# Patient Record
Sex: Female | Born: 1940
Health system: Southern US, Community
[De-identification: ages and names within clinical notes are randomized; demographics above are authoritative.]

## PROBLEM LIST (undated history)

## (undated) DIAGNOSIS — H269 Unspecified cataract: Secondary | ICD-10-CM

## (undated) DIAGNOSIS — K2970 Gastritis, unspecified, without bleeding: Secondary | ICD-10-CM

## (undated) DIAGNOSIS — D869 Sarcoidosis, unspecified: Secondary | ICD-10-CM

## (undated) DIAGNOSIS — I499 Cardiac arrhythmia, unspecified: Secondary | ICD-10-CM

## (undated) DIAGNOSIS — R112 Nausea with vomiting, unspecified: Secondary | ICD-10-CM

## (undated) DIAGNOSIS — K635 Polyp of colon: Secondary | ICD-10-CM

## (undated) DIAGNOSIS — K2990 Gastroduodenitis, unspecified, without bleeding: Secondary | ICD-10-CM

## (undated) DIAGNOSIS — Z860100 Personal history of colon polyps, unspecified: Secondary | ICD-10-CM

## (undated) DIAGNOSIS — N390 Urinary tract infection, site not specified: Secondary | ICD-10-CM

## (undated) DIAGNOSIS — C801 Malignant (primary) neoplasm, unspecified: Secondary | ICD-10-CM

## (undated) DIAGNOSIS — K5730 Diverticulosis of large intestine without perforation or abscess without bleeding: Secondary | ICD-10-CM

## (undated) DIAGNOSIS — K209 Esophagitis, unspecified without bleeding: Secondary | ICD-10-CM

## (undated) DIAGNOSIS — F419 Anxiety disorder, unspecified: Secondary | ICD-10-CM

## (undated) DIAGNOSIS — Z9889 Other specified postprocedural states: Secondary | ICD-10-CM

## (undated) DIAGNOSIS — IMO0001 Reserved for inherently not codable concepts without codable children: Secondary | ICD-10-CM

## (undated) DIAGNOSIS — T7840XA Allergy, unspecified, initial encounter: Secondary | ICD-10-CM

## (undated) DIAGNOSIS — E7800 Pure hypercholesterolemia, unspecified: Secondary | ICD-10-CM

## (undated) DIAGNOSIS — H209 Unspecified iridocyclitis: Secondary | ICD-10-CM

## (undated) DIAGNOSIS — M1990 Unspecified osteoarthritis, unspecified site: Secondary | ICD-10-CM

## (undated) DIAGNOSIS — I341 Nonrheumatic mitral (valve) prolapse: Secondary | ICD-10-CM

## (undated) DIAGNOSIS — D126 Benign neoplasm of colon, unspecified: Secondary | ICD-10-CM

## (undated) DIAGNOSIS — K589 Irritable bowel syndrome without diarrhea: Secondary | ICD-10-CM

## (undated) DIAGNOSIS — K449 Diaphragmatic hernia without obstruction or gangrene: Secondary | ICD-10-CM

## (undated) DIAGNOSIS — M8580 Other specified disorders of bone density and structure, unspecified site: Secondary | ICD-10-CM

## (undated) DIAGNOSIS — K219 Gastro-esophageal reflux disease without esophagitis: Secondary | ICD-10-CM

## (undated) DIAGNOSIS — K222 Esophageal obstruction: Secondary | ICD-10-CM

## (undated) DIAGNOSIS — I10 Essential (primary) hypertension: Principal | ICD-10-CM

## (undated) HISTORY — PX: VESICOVAGINAL FISTULA CLOSURE W/ TAH: SUR271

## (undated) HISTORY — DX: Gastroduodenitis, unspecified, without bleeding: K29.90

## (undated) HISTORY — DX: Irritable bowel syndrome, unspecified: K58.9

## (undated) HISTORY — DX: Anxiety disorder, unspecified: F41.9

## (undated) HISTORY — PX: CATARACT EXTRACTION: SUR2

## (undated) HISTORY — DX: Personal history of colonic polyps: Z86.010

## (undated) HISTORY — DX: Urinary tract infection, site not specified: N39.0

## (undated) HISTORY — PX: POLYPECTOMY: SHX149

## (undated) HISTORY — DX: Malignant (primary) neoplasm, unspecified: C80.1

## (undated) HISTORY — DX: Diaphragmatic hernia without obstruction or gangrene: K44.9

## (undated) HISTORY — DX: Personal history of colon polyps, unspecified: Z86.0100

## (undated) HISTORY — DX: Sarcoidosis, unspecified: D86.9

## (undated) HISTORY — DX: Esophageal obstruction: K22.2

## (undated) HISTORY — DX: Diverticulosis of large intestine without perforation or abscess without bleeding: K57.30

## (undated) HISTORY — PX: UPPER GASTROINTESTINAL ENDOSCOPY: SHX188

## (undated) HISTORY — DX: Esophagitis, unspecified without bleeding: K20.90

## (undated) HISTORY — DX: Other specified disorders of bone density and structure, unspecified site: M85.80

## (undated) HISTORY — DX: Unspecified cataract: H26.9

## (undated) HISTORY — DX: Polyp of colon: K63.5

## (undated) HISTORY — DX: Nonrheumatic mitral (valve) prolapse: I34.1

## (undated) HISTORY — DX: Benign neoplasm of colon, unspecified: D12.6

## (undated) HISTORY — DX: Allergy, unspecified, initial encounter: T78.40XA

## (undated) HISTORY — PX: BASAL CELL CARCINOMA EXCISION: SHX1214

## (undated) HISTORY — DX: Pure hypercholesterolemia, unspecified: E78.00

## (undated) HISTORY — DX: Essential (primary) hypertension: I10

## (undated) HISTORY — DX: Gastritis, unspecified, without bleeding: K29.70

## (undated) HISTORY — DX: Gastro-esophageal reflux disease without esophagitis: K21.9

## (undated) HISTORY — DX: Unspecified iridocyclitis: H20.9

## (undated) HISTORY — DX: Esophagitis, unspecified: K20.9

## (undated) HISTORY — DX: Unspecified osteoarthritis, unspecified site: M19.90

---

## 1998-11-03 ENCOUNTER — Ambulatory Visit (HOSPITAL_COMMUNITY): Admission: RE | Admit: 1998-11-03 | Discharge: 1998-11-03 | Payer: Self-pay | Admitting: Pulmonary Disease

## 1998-11-03 ENCOUNTER — Encounter: Payer: Self-pay | Admitting: Pulmonary Disease

## 1998-12-20 ENCOUNTER — Ambulatory Visit (HOSPITAL_BASED_OUTPATIENT_CLINIC_OR_DEPARTMENT_OTHER): Admission: RE | Admit: 1998-12-20 | Discharge: 1998-12-20 | Payer: Self-pay | Admitting: Orthopedic Surgery

## 2004-07-03 ENCOUNTER — Ambulatory Visit: Payer: Self-pay | Admitting: Pulmonary Disease

## 2004-07-18 ENCOUNTER — Ambulatory Visit: Payer: Self-pay | Admitting: Pulmonary Disease

## 2005-06-04 ENCOUNTER — Ambulatory Visit: Payer: Self-pay | Admitting: Pulmonary Disease

## 2005-07-13 ENCOUNTER — Ambulatory Visit: Payer: Self-pay | Admitting: Pulmonary Disease

## 2005-12-11 ENCOUNTER — Ambulatory Visit: Payer: Self-pay | Admitting: Gastroenterology

## 2005-12-18 ENCOUNTER — Ambulatory Visit: Payer: Self-pay | Admitting: Internal Medicine

## 2006-01-01 ENCOUNTER — Encounter (INDEPENDENT_AMBULATORY_CARE_PROVIDER_SITE_OTHER): Payer: Self-pay | Admitting: Specialist

## 2006-01-01 ENCOUNTER — Ambulatory Visit: Payer: Self-pay | Admitting: Gastroenterology

## 2006-01-15 ENCOUNTER — Ambulatory Visit: Payer: Self-pay | Admitting: Gastroenterology

## 2006-01-17 ENCOUNTER — Other Ambulatory Visit: Admission: RE | Admit: 2006-01-17 | Discharge: 2006-01-17 | Payer: Self-pay | Admitting: Obstetrics and Gynecology

## 2006-02-26 ENCOUNTER — Encounter: Admission: RE | Admit: 2006-02-26 | Discharge: 2006-02-26 | Payer: Self-pay | Admitting: Obstetrics and Gynecology

## 2006-04-26 ENCOUNTER — Encounter (INDEPENDENT_AMBULATORY_CARE_PROVIDER_SITE_OTHER): Payer: Self-pay | Admitting: Specialist

## 2006-04-27 ENCOUNTER — Inpatient Hospital Stay (HOSPITAL_COMMUNITY): Admission: RE | Admit: 2006-04-27 | Discharge: 2006-04-28 | Payer: Self-pay | Admitting: Obstetrics and Gynecology

## 2006-08-01 ENCOUNTER — Ambulatory Visit: Payer: Self-pay | Admitting: Pulmonary Disease

## 2006-08-01 LAB — CONVERTED CEMR LAB
Albumin: 4.1 g/dL (ref 3.5–5.2)
Basophils Absolute: 0 10*3/uL (ref 0.0–0.1)
Bilirubin Urine: NEGATIVE
CO2: 30 meq/L (ref 19–32)
Chloride: 102 meq/L (ref 96–112)
Chol/HDL Ratio, serum: 4.1
Creatinine, Ser: 0.9 mg/dL (ref 0.4–1.2)
Eosinophil percent: 2.7 % (ref 0.0–5.0)
GFR calc non Af Amer: 67 mL/min
Glucose, Bld: 105 mg/dL — ABNORMAL HIGH (ref 70–99)
HCT: 45.6 % (ref 36.0–46.0)
Ketones, ur: NEGATIVE mg/dL
MCHC: 33.6 g/dL (ref 30.0–36.0)
MCV: 90.3 fL (ref 78.0–100.0)
Monocytes Absolute: 0.4 10*3/uL (ref 0.2–0.7)
Monocytes Relative: 8.3 % (ref 3.0–11.0)
Neutro Abs: 2.9 10*3/uL (ref 1.4–7.7)
Nitrite: NEGATIVE
Platelets: 292 10*3/uL (ref 150–400)
RBC: 5.05 M/uL (ref 3.87–5.11)
RDW: 12.3 % (ref 11.5–14.6)
Sodium: 141 meq/L (ref 135–145)
Specific Gravity, Urine: <=1.005 (ref 1.000–1.03)
Total Bilirubin: 1.4 mg/dL — ABNORMAL HIGH (ref 0.3–1.2)
Total Protein, Urine: NEGATIVE mg/dL
Triglyceride fasting, serum: 122 mg/dL (ref 0–149)
Urine Glucose: NEGATIVE mg/dL
VLDL: 24 mg/dL (ref 0–40)
pH: 6 (ref 5.0–8.0)

## 2006-08-05 ENCOUNTER — Ambulatory Visit: Payer: Self-pay | Admitting: Cardiology

## 2006-08-14 ENCOUNTER — Ambulatory Visit: Payer: Self-pay | Admitting: Pulmonary Disease

## 2006-09-30 ENCOUNTER — Ambulatory Visit: Payer: Self-pay | Admitting: Pulmonary Disease

## 2006-12-19 ENCOUNTER — Ambulatory Visit: Payer: Self-pay | Admitting: Pulmonary Disease

## 2006-12-19 LAB — CONVERTED CEMR LAB
Crystals: NEGATIVE
Mucus, UA: NEGATIVE
Total Protein, Urine: 100 mg/dL — AB
Urobilinogen, UA: 0.2 (ref 0.0–1.0)

## 2007-01-23 ENCOUNTER — Ambulatory Visit: Payer: Self-pay | Admitting: Pulmonary Disease

## 2007-01-23 LAB — CONVERTED CEMR LAB
BUN: 13 mg/dL (ref 6–23)
Calcium: 9.6 mg/dL (ref 8.4–10.5)
Chloride: 99 meq/L (ref 96–112)
GFR calc non Af Amer: 89 mL/min
Sodium: 141 meq/L (ref 135–145)
Specific Gravity, Urine: 1.015 (ref 1.000–1.03)
pH: 6 (ref 5.0–8.0)

## 2007-02-05 ENCOUNTER — Ambulatory Visit: Payer: Self-pay | Admitting: Internal Medicine

## 2007-03-11 ENCOUNTER — Ambulatory Visit: Payer: Self-pay | Admitting: Pulmonary Disease

## 2007-06-11 ENCOUNTER — Encounter: Payer: Self-pay | Admitting: Pulmonary Disease

## 2007-08-06 DIAGNOSIS — F411 Generalized anxiety disorder: Secondary | ICD-10-CM | POA: Insufficient documentation

## 2007-08-06 DIAGNOSIS — I059 Rheumatic mitral valve disease, unspecified: Secondary | ICD-10-CM | POA: Insufficient documentation

## 2007-08-06 DIAGNOSIS — K589 Irritable bowel syndrome without diarrhea: Secondary | ICD-10-CM | POA: Insufficient documentation

## 2007-08-06 DIAGNOSIS — M545 Low back pain, unspecified: Secondary | ICD-10-CM | POA: Insufficient documentation

## 2007-08-06 DIAGNOSIS — Z86010 Personal history of colonic polyps: Secondary | ICD-10-CM | POA: Insufficient documentation

## 2007-08-06 DIAGNOSIS — E7800 Pure hypercholesterolemia, unspecified: Secondary | ICD-10-CM | POA: Insufficient documentation

## 2007-09-09 ENCOUNTER — Ambulatory Visit: Payer: Self-pay | Admitting: Pulmonary Disease

## 2007-09-09 DIAGNOSIS — N390 Urinary tract infection, site not specified: Secondary | ICD-10-CM | POA: Insufficient documentation

## 2007-09-21 DIAGNOSIS — H209 Unspecified iridocyclitis: Secondary | ICD-10-CM | POA: Insufficient documentation

## 2007-09-21 DIAGNOSIS — M1990 Unspecified osteoarthritis, unspecified site: Secondary | ICD-10-CM | POA: Insufficient documentation

## 2007-09-21 LAB — CONVERTED CEMR LAB
Basophils Absolute: 0 10*3/uL (ref 0.0–0.1)
Bilirubin Urine: NEGATIVE
Bilirubin, Direct: 0.2 mg/dL (ref 0.0–0.3)
Cholesterol: 183 mg/dL (ref 0–200)
Eosinophils Absolute: 0.2 10*3/uL (ref 0.0–0.6)
GFR calc Af Amer: 92 mL/min
GFR calc non Af Amer: 76 mL/min
Glucose, Bld: 100 mg/dL — ABNORMAL HIGH (ref 70–99)
HCT: 43.6 % (ref 36.0–46.0)
Hemoglobin, Urine: NEGATIVE
Ketones, ur: NEGATIVE mg/dL
Leukocytes, UA: NEGATIVE
MCHC: 33.2 g/dL (ref 30.0–36.0)
MCV: 90.6 fL (ref 78.0–100.0)
Neutrophils Relative %: 67.2 % (ref 43.0–77.0)
Nitrite: NEGATIVE
RBC: 4.82 M/uL (ref 3.87–5.11)
Sodium: 143 meq/L (ref 135–145)
TSH: 1.66 microintl units/mL (ref 0.35–5.50)
Total CHOL/HDL Ratio: 4.8
Total Protein, Urine: NEGATIVE mg/dL
Triglycerides: 189 mg/dL — ABNORMAL HIGH (ref 0–149)
Urine Glucose: NEGATIVE mg/dL
Urobilinogen, UA: 0.2 (ref 0.0–1.0)
pH: 7 (ref 5.0–8.0)

## 2007-12-12 ENCOUNTER — Ambulatory Visit: Payer: Self-pay | Admitting: Pulmonary Disease

## 2007-12-12 DIAGNOSIS — M949 Disorder of cartilage, unspecified: Secondary | ICD-10-CM

## 2007-12-12 DIAGNOSIS — M899 Disorder of bone, unspecified: Secondary | ICD-10-CM | POA: Insufficient documentation

## 2007-12-16 ENCOUNTER — Ambulatory Visit: Payer: Self-pay | Admitting: Pulmonary Disease

## 2007-12-17 ENCOUNTER — Ambulatory Visit: Payer: Self-pay | Admitting: Internal Medicine

## 2007-12-17 ENCOUNTER — Encounter: Payer: Self-pay | Admitting: Pulmonary Disease

## 2007-12-30 ENCOUNTER — Ambulatory Visit: Payer: Self-pay | Admitting: Cardiovascular Disease

## 2008-01-22 ENCOUNTER — Telehealth: Payer: Self-pay | Admitting: Pulmonary Disease

## 2008-01-22 LAB — CONVERTED CEMR LAB
ALT: 26 units/L (ref 0–35)
AST: 31 units/L (ref 0–37)
Bilirubin, Direct: 0.2 mg/dL (ref 0.0–0.3)
CO2: 29 meq/L (ref 19–32)
Calcium: 9.6 mg/dL (ref 8.4–10.5)
Chloride: 103 meq/L (ref 96–112)
Creatinine, Ser: 0.9 mg/dL (ref 0.4–1.2)
Glucose, Bld: 96 mg/dL (ref 70–99)
HDL: 35.9 mg/dL — ABNORMAL LOW (ref >39.0)
Sodium: 139 meq/L (ref 135–145)
Total Protein: 7.3 g/dL (ref 6.0–8.3)
Triglycerides: 163 mg/dL — ABNORMAL HIGH (ref 0–149)

## 2008-03-24 ENCOUNTER — Encounter: Admission: RE | Admit: 2008-03-24 | Discharge: 2008-03-24 | Payer: Self-pay | Admitting: Obstetrics and Gynecology

## 2008-06-14 ENCOUNTER — Ambulatory Visit: Payer: Self-pay | Admitting: Pulmonary Disease

## 2008-09-08 ENCOUNTER — Ambulatory Visit: Payer: Self-pay | Admitting: Pulmonary Disease

## 2008-09-12 LAB — CONVERTED CEMR LAB: Vit D, 25-Hydroxy: 44 ng/mL (ref 30–89)

## 2008-11-15 ENCOUNTER — Encounter (INDEPENDENT_AMBULATORY_CARE_PROVIDER_SITE_OTHER): Payer: Self-pay | Admitting: *Deleted

## 2009-06-06 ENCOUNTER — Telehealth (INDEPENDENT_AMBULATORY_CARE_PROVIDER_SITE_OTHER): Payer: Self-pay | Admitting: *Deleted

## 2009-07-06 ENCOUNTER — Ambulatory Visit: Payer: Self-pay | Admitting: Pulmonary Disease

## 2009-11-16 ENCOUNTER — Ambulatory Visit: Payer: Self-pay | Admitting: Pulmonary Disease

## 2009-11-19 LAB — CONVERTED CEMR LAB
Albumin: 4 g/dL (ref 3.5–5.2)
Alkaline Phosphatase: 86 units/L (ref 39–117)
Basophils Relative: 0.3 % (ref 0.0–3.0)
Bilirubin Urine: NEGATIVE
Bilirubin, Direct: 0.3 mg/dL (ref 0.0–0.3)
CO2: 32 meq/L (ref 19–32)
Calcium: 9.2 mg/dL (ref 8.4–10.5)
Creatinine, Ser: 0.7 mg/dL (ref 0.4–1.2)
GFR calc non Af Amer: 88.11 mL/min (ref >60)
HCT: 42.9 % (ref 36.0–46.0)
HDL: 41.1 mg/dL (ref >39.00)
Hemoglobin, Urine: NEGATIVE
Hemoglobin: 14.8 g/dL (ref 12.0–15.0)
Ketones, ur: NEGATIVE mg/dL
LDL Cholesterol: 93 mg/dL (ref 0–99)
Leukocytes, UA: NEGATIVE
Lymphocytes Relative: 23 % (ref 12.0–46.0)
Lymphs Abs: 1.4 10*3/uL (ref 0.7–4.0)
Monocytes Relative: 6.8 % (ref 3.0–12.0)
Neutro Abs: 4.1 10*3/uL (ref 1.4–7.7)
Nitrite: NEGATIVE
RBC: 4.69 M/uL (ref 3.87–5.11)
RDW: 13.1 % (ref 11.5–14.6)
Sodium: 143 meq/L (ref 135–145)
Total CHOL/HDL Ratio: 4
Total Protein: 7.1 g/dL (ref 6.0–8.3)
Triglycerides: 131 mg/dL (ref 0.0–149.0)
Urobilinogen, UA: 0.2 (ref 0.0–1.0)
VLDL: 26.2 mg/dL (ref 0.0–40.0)

## 2009-12-13 ENCOUNTER — Encounter: Payer: Self-pay | Admitting: Pulmonary Disease

## 2009-12-13 ENCOUNTER — Ambulatory Visit: Payer: Self-pay | Admitting: Internal Medicine

## 2010-01-06 ENCOUNTER — Telehealth (INDEPENDENT_AMBULATORY_CARE_PROVIDER_SITE_OTHER): Payer: Self-pay | Admitting: *Deleted

## 2010-01-13 ENCOUNTER — Telehealth: Payer: Self-pay | Admitting: Pulmonary Disease

## 2010-03-20 ENCOUNTER — Telehealth: Payer: Self-pay | Admitting: Pulmonary Disease

## 2010-03-23 ENCOUNTER — Telehealth: Payer: Self-pay | Admitting: Pulmonary Disease

## 2010-03-25 ENCOUNTER — Emergency Department (HOSPITAL_BASED_OUTPATIENT_CLINIC_OR_DEPARTMENT_OTHER): Admission: EM | Admit: 2010-03-25 | Discharge: 2010-03-25 | Payer: Self-pay | Admitting: Emergency Medicine

## 2010-03-25 ENCOUNTER — Ambulatory Visit: Payer: Self-pay | Admitting: Diagnostic Radiology

## 2010-04-24 ENCOUNTER — Encounter: Payer: Self-pay | Admitting: Pulmonary Disease

## 2010-05-17 ENCOUNTER — Ambulatory Visit: Payer: Self-pay | Admitting: Pulmonary Disease

## 2010-06-13 ENCOUNTER — Telehealth (INDEPENDENT_AMBULATORY_CARE_PROVIDER_SITE_OTHER): Payer: Self-pay | Admitting: *Deleted

## 2010-07-23 HISTORY — PX: COLONOSCOPY: SHX174

## 2010-08-20 LAB — CONVERTED CEMR LAB
ALT: 22 units/L (ref 0–35)
AST: 24 units/L (ref 0–37)
Basophils Relative: 0.3 % (ref 0.0–3.0)
Bilirubin Urine: NEGATIVE
Bilirubin, Direct: 0.2 mg/dL (ref 0.0–0.3)
CO2: 31 meq/L (ref 19–32)
Calcium: 9.6 mg/dL (ref 8.4–10.5)
Chloride: 103 meq/L (ref 96–112)
Creatinine, Ser: 0.8 mg/dL (ref 0.4–1.2)
Eosinophils Relative: 4 % (ref 0.0–5.0)
Glucose, Bld: 102 mg/dL — ABNORMAL HIGH (ref 70–99)
HCT: 44.5 % (ref 36.0–46.0)
Hemoglobin, Urine: NEGATIVE
Hemoglobin: 15.8 g/dL — ABNORMAL HIGH (ref 12.0–15.0)
Ketones, ur: NEGATIVE mg/dL
Lymphocytes Relative: 26.9 % (ref 12.0–46.0)
Monocytes Absolute: 0.4 10*3/uL (ref 0.1–1.0)
Monocytes Relative: 7.4 % (ref 3.0–12.0)
Mucus, UA: NEGATIVE
Neutro Abs: 3.3 10*3/uL (ref 1.4–7.7)
RBC: 4.97 M/uL (ref 3.87–5.11)
RDW: 12.4 % (ref 11.5–14.6)
Sed Rate: 18 mm/hr (ref 0–22)
TSH: 1.79 microintl units/mL (ref 0.35–5.50)
Total Bilirubin: 1.3 mg/dL — ABNORMAL HIGH (ref 0.3–1.2)
Total CHOL/HDL Ratio: 4.5
Total Protein, Urine: NEGATIVE mg/dL
Total Protein: 7.5 g/dL (ref 6.0–8.3)
Triglycerides: 160 mg/dL — ABNORMAL HIGH (ref 0–149)
Urine Glucose: NEGATIVE mg/dL
pH: 6.5 (ref 5.0–8.0)

## 2010-08-22 NOTE — Progress Notes (Signed)
Summary: results  Phone Note Call from Patient   Caller: Patient Call For: nadel Summary of Call: calling for results of bone density Initial call taken by: Rickard Patience,  January 06, 2010 9:33 AM  Follow-up for Phone Call        pt aware of bone density results Follow-up by: Philipp Deputy CMA,  January 06, 2010 9:49 AM

## 2010-08-22 NOTE — Progress Notes (Signed)
Summary: sick  Phone Note Call from Patient Call back at [facility] Phone [phone]   Caller: Patient Call For: Laura [name] Reason for Call: Talk to Nurse Summary of Call: achy, fever, headache, sore throat, x4-5 days, skin sensitive, drainage.  Please advise. [facility], [phone] Initial call taken by: Eugene Gavia,  March 20, 2010 9:25 AM  Follow-up for Phone Call        Spoke with pt.  She c/o "extreme achiness", fever, HA, and "sensitive skin",sore throat, dry cough- onset 5 days ago after babysitting her sick granddaughter.  Has been using MMW fore sore throat and this has helped some.  Tylenol for aches not helping.  Pls advise thanks! Allergic to codiene Follow-up by: Vernie Murders,  March 20, 2010 10:14 AM  Additional Follow-up for Phone Call Additional follow up Details #1::        per SN---ok for pt to have augmetin 875mg    #14  1 by mouth two times a day until gone.  pred dosepak  5mg /6 day pack  #1   as directed. these meds have been sent to pts pharmacy and pt is aware.  called and spoke with pt and she is aware of meds Randell Loop CMA  March 20, 2010 4:07 PM     New/Updated Medications: AUGMENTIN 875-125 MG TABS (AMOXICILLIN-POT CLAVULANATE) take one tablet by mouth two times a day until gone PREDNISONE (PAK) 5 MG TABS (PREDNISONE) 6 day pack---take as directed Prescriptions: PREDNISONE (PAK) 5 MG TABS (PREDNISONE) 6 day pack---take as directed  #1 x 0   Entered by:   Randell Loop CMA   Authorized by:   Michele Mcalpine MD   Signed by:   Randell Loop CMA on 03/20/2010   Method used:   Electronically to        [facility] [phone]* (retail)       [address]       [place], [place]  [address]       Ph: [phone]       Fax: [phone]   RxID:   [id number] AUGMENTIN 875-125 MG TABS (AMOXICILLIN-POT CLAVULANATE) take one tablet by mouth two times a day until gone  #14 x 0   Entered by:   Randell Loop CMA   Authorized by:   Michele Mcalpine MD   Signed by:   Randell Loop CMA on 03/20/2010   Method used:   Electronically to        [facility] [phone]* (retail)       [address]       [place], [place]  [address]       Ph: [phone]       Fax: [phone]   RxID:   [id number]

## 2010-08-22 NOTE — Assessment & Plan Note (Signed)
Summary: ROV 6 MONTHS///KP   CC:  6 month ROV & review of mult medical problems....  History of Present Illness: 70 y/o WF here for a follow up visit... she has prob Sarcoidosis w/ hx Uveitis and BHA on CXR... we are following a "watchful waiting" protocol...she continues to feel well- no cough, sputum, CP, or SOB...    ~  November 16, 2009:  she has noted a recent URI w/ "crud", some discomfort when swallowing, swollen glands, fatigue, etc... denies f/c/s, not much cough/ phlegm, no CP etc... feels sl better w/ Tylenol Rx over weekend but we decided to Rx w/ Depo & MMW...  otherw feeling well> no chest symptoms- ie cough, sputum, dyspnea, etc... due for f/u CXR for her presumed Sarcoid, check ACE, etc...  Chol looks good on Simva40;  reflux controlled on Nexium/ Zantac;  she is due for a 65yr f/u BMD...   ~  May 17, 2010:  she reports incr stress w/ sister Louise's situation- offered Rebeca Allegra Rx but she declines... she had BMD 5/11- min osteopenia & stable on Calcium, MVI, Vit D... no new complaints or concerns- breathing stable;  Chol controlled on diet + Simva20;  GI is stable & up to date;  she had the 2011 Flu shot, due for PNEUMOVAX, & discussed Shingles vaccine as well.    Current Problem List:  Hx of UVEITIS (ICD-364.3) - eval at [facility] since 2001, given steroid injection in 2002... s/p right cataract surg 2/09 and no active uveitis seen...  ~  4/11:  she tells me she was seen 10/10 & doing well, may need left cataract surg soon.  R/O SARCOIDOSIS (ICD-135) - Hx uveitis w/ eval at [facility], and subseq f/u CXR w/ CTChest showing bilat hilar adenopathy, and w/u showing neg PPD, ACE=53, sed=13... prev PFT's 1/08 showed FVC 3.75 (111%), FEV1 2.74 (102%), %1sec=73, mid-flows=70%... being followed w/ serial films and "watchful waiting"... there is a family hx of sarcoidosis.  ~   we had a f/u CXR in Feb09- streaky opacity right mid lung zone, similar to 2008...   ~  CT Chest 7/08 & 6/09 showed  mediastinal > hilar adenopathy some w/ calcif (generally smaller than 1/08), area of atx/ scarring right mid zone (infer RUL) & scat <1cm nodules in lower zones w/o change...  ~  CXR 2/10 showed bilat hilar prominence & scarring appears the same- NAD... labs all WNL w/ ACE=56.  ~  CXR 4/11 showed mild bilat hilar prominence, scarring, NAD- osteopenia 7 DJD in TSpine... ACE= 37.  MITRAL VALVE PROLAPSE (ICD-424.0) - on ASA 81mg /d... clinical dx w/ 2DEcho 1999 showing flat closure but no frank prolapse...  HYPERCHOLESTEROLEMIA (ICD-272.0) - on SIMVASTATIN 40mg /d now...  ~  FLP 1/08 on Lip10 showed TChol 183, TG 122, HDL 45, LDL 114...   ~  FLP 2/09 on Lip10 showed TChol 183, TG 189, HDL 39, LDL 107... continue diet & change to Simva40.   ~  FLP 5/09 on Simva40 showed TChol 185, TG 163, HDL 36, LDL 117... she wants to stay on Simva40.  ~  FLP 2/10 showed TChol 176, Tg 160, HDL 39, LDL 105... rec> continue same.  ~  FLP 4/11 showed TChol 160, TG 131, HDL 41, LDL 93  GERD (ICD-530.81) - on NEXIUM 40mg /d & ZANTAC 300mg Qhs...last EGD 6/07 w/ 3cmHH, stricture, gastitis (HPylori neg)... I offered to change her Nexium to a generic but she declined- "DrPatterson told me never to change this med"... she notes occas nocturnal reflux symptoms.  IRRITABLE BOWEL SYNDROME (ICD-564.1) COLONIC POLYPS, HX OF (ICD-V12.72) - last colonoscopy was 6/07 by DrPatterson showing divertics & polyps (adenomatous)... f/u planned 11yrs...  UTI'S, CHRONIC (ICD-599.0) - eval by DrOttelin for urology... now off the Macrodantin and using cranberry juice without recurrent UTI.  DEGENERATIVE JOINT DISEASE (ICD-715.90) - eval by DrDalldorf w/ mod DJD in knees & hx of torn left meniscus... 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 overall improved now.  LOW BACK PAIN, CHRONIC (ICD-724.2)  OSTEOPENIA (ICD-733.90) - on Calcium, MVI, Vit D...   ~  BMD in 2005 showed TScore -1.2 in Spine, & -0.7 in [facility].  ~  BMD 5/09 showed TScores -1.5 in Spine & -1.1 in [facility].  ~  BMD sched 5/11 showed TScores -1.2 in Spine, and -1.2 in [facility].  ANXIETY (ICD-300.00)  Hx of BASAL CELL SKIN CANCER - she still sees her Derm in [place] yearly...  Health Maintenance:  ~  GI:  followed by DrPatterson w/ colon as above...  ~  GYN:  followed by DrRichardson & doing well by pt's report... [facility]...  ~  Immunizations:  she gets yearly flu shots;  has PNEUMOVAX in 1998 & repeated 10/11 (age 61);  ?last Tetanus;  we discussed Shingles vaccine (she had bout of shingles involving her right hip & leg  ~age30) & she will decide.   Preventive Screening-Counseling & Management  Alcohol-Tobacco     Smoking Status: never  Allergies: 1)  ! Codeine 2)  ! Augmentin 3)  ! Prednisone  Comments:  Nurse/Medical Assistant: The patient's medications and allergies were reviewed with the patient and were updated in the Medication and Allergy Lists.  Past History:  Past Medical History: Hx of UVEITIS (ICD-364.3) R/O SARCOIDOSIS (ICD-135) MITRAL VALVE PROLAPSE (ICD-424.0) HYPERCHOLESTEROLEMIA (ICD-272.0) GERD (ICD-530.81) IRRITABLE BOWEL SYNDROME (ICD-564.1) COLONIC POLYPS, HX OF (ICD-V12.72) UTI'S, CHRONIC (ICD-599.0) DEGENERATIVE JOINT DISEASE (ICD-715.90) LOW BACK PAIN, CHRONIC (ICD-724.2) OSTEOPENIA (ICD-733.90) ANXIETY (ICD-300.00)  Past Surgical History: S/P Hysterectomy S/P removal of inflammatory mass from right antecubital fossa 4/00 by DrSypher S/P basal cell skin cancer removed S/P right cataract surg 2/09 at [facility]  Family History: Reviewed history from 12/12/2007 and no changes required. Family hx is pos for Sarcoidosis in sister & brother Nephew w/ Wegener's granulomatosis mother died at age 51 from cancer father died at age 86 from sepsis  2 siblings 1 died at age 72 from cancer 1 died at age 17 from a  stroke  Social History: Reviewed history from 12/12/2007 and no changes required. pt is retired married to Rathbun Mccance 2 children  Review of Systems      See HPI  The patient denies anorexia, fever, weight loss, weight gain, vision loss, decreased hearing, hoarseness, chest pain, syncope, dyspnea on exertion, peripheral edema, prolonged cough, headaches, hemoptysis, abdominal pain, melena, hematochezia, severe indigestion/heartburn, hematuria, incontinence, muscle weakness, suspicious skin lesions, transient blindness, difficulty walking, depression, unusual weight change, abnormal bleeding, enlarged lymph nodes, and angioedema.    Vital Signs:  Patient profile:   70 year old female Height:      69 inches Weight:      180.25 pounds BMI:     26.71 O2 Sat:      97 % on Room air Temp:     97.0 degrees F oral Pulse rate:   72 / minute BP sitting:   136 / 82  (left arm) Cuff size:  regular  Vitals Entered By: Randell Loop CMA (May 17, 2010 9:44 AM)  O2 Sat at Rest %:  97 O2 Flow:  Room air CC: 6 month ROV & review of mult medical problems... Is Patient Diabetic? No Pain Assessment Patient in pain? no      Comments meds updated today with pt   Physical Exam  Additional Exam:  WD, WN, 70 y/o WF in NAD... GENERAL:  Alert & oriented; pleasant & cooperative... HEENT:  [place]/AT, EOM-wnl, PERRLA, EACs-clear, TMs-wnl, NOSE-clear, THROAT-clear & wnl. NECK:  Supple w/ fairROM; no JVD; normal carotid impulses w/o bruits; no thyromegaly or nodules palpated; no lymphadenopathy. CHEST:  Clear to P & A; without wheezes/ rales/ or rhonchi. HEART:  Regular Rhythm; without murmurs/ rubs/ or gallops. ABDOMEN:  Soft & nontender; normal bowel sounds; no organomegaly or masses detected. EXT: without deformities, mild arthritic changes; no varicose veins/ +venous insuffic/ no edema. NEURO:  CN's intact;  no focal neuro deficits... DERM:  No lesions noted; no rash etc...    Impression &  Recommendations:  Problem # 1:  R/O SARCOIDOSIS (ICD-135) No activity>  yearly CXR & lab in [place] & Ophthal checks by [facility]...  Problem # 2:  MITRAL VALVE PROLAPSE (ICD-424.0) Asymptomatic>  no CP, palpit, SOB, etc... Her updated medication list for this problem includes:    Aspirin Adult Low Strength 81 Mg Tbec (Aspirin) .[name]... Take 1 tab by mouth once daily...  Problem # 3:  HYPERCHOLESTEROLEMIA (ICD-272.0) Controlled on diet + Simva40... Her updated medication list for this problem includes:    Simvastatin 40 Mg Tabs (Simvastatin) .[name]... 1 tab by mouth at bedtime  Problem # 4:  GERD (ICD-530.81) GI is stable & up to date... Her updated medication list for this problem includes:    Nexium 40 Mg Cpdr (Esomeprazole magnesium) .[name]... 1 tab by mouth in am taken 30 min before a meal...    Ranitidine Hcl 300 Mg Caps (Ranitidine hcl) .[name]... 1 tab by mouth at bedtime as directed...  Problem # 5:  UTI'S, CHRONIC (ICD-599.0) GU stable w/o symptoms...  Problem # 6:  DEGENERATIVE JOINT DISEASE (ICD-715.90) DJD per [facility] & uses Tylenol etc Prn... Her updated medication list for this problem includes:    Aspirin Adult Low Strength 81 Mg Tbec (Aspirin) .[name]... Take 1 tab by mouth once daily...  Problem # 7:  OSTEOPENIA (ICD-733.90) Stable BMD on Calcium, MVI, Vit D, & exerc program...  Problem # 8:  OTHER MEDICAL PROBLEMS AS NOTED>>> OK over-65 PNEUMOVAX today...  Complete Medication List: 1)  Aspirin Adult Low Strength 81 Mg Tbec (Aspirin) .... Take 1 tab by mouth once daily.Marland KitchenMarland Kitchen 2)  Simvastatin 40 Mg Tabs (Simvastatin) .[name].. 1 tab by mouth at bedtime 3)  Fish Oil 1000 Mg Caps (Omega-3 fatty acids) .... Take 1 tablet by mouth once a day 4)  Nexium 40 Mg Cpdr (Esomeprazole magnesium) .[name].. 1 tab by mouth in am taken 30 min before a meal... 5)  Ranitidine Hcl 300 Mg Caps (Ranitidine hcl) .[name].. 1 tab by mouth at bedtime as directed... 6)  Caltrate 600+d 600-400 Mg-unit Tabs (Calcium  carbonate-vitamin d) .... Take 1 tab by mouth two times a day... 7)  Multivitamins Tabs (Multiple vitamin) .... Take 1 tab by mouth once daily.Marland KitchenMarland Kitchen 8)  Vitamin D 1000 Unit Tabs (Cholecalciferol) .... Take 1 tablet by mouth once a day 9)  Magic Mouthwash  .... 1 tsp gargle & swallow up to 4 times daily as needed...  Other Orders: Pneumococcal Vaccine (  16109) Admin 1st Vaccine (60454)  Patient Instructions: 1)  Today we updated your med list- see below.... 2)  Continue your current meds the same... 3)  Today we gave you the PNEUMONIA vaccine (current recommendation is for one shot after age 41)... 4)  Call for any problems.Marland KitchenMarland Kitchen 5)  Please schedule a follow-up appointment in 6 months. Prescriptions: MAGIC MOUTHWASH 1 tsp gargle & swallow up to 4 times daily as needed...  #4 oz x prn   Entered and Authorized by:   Michele Mcalpine MD   Signed by:   Michele Mcalpine MD on 05/17/2010   Method used:   Print then Give to Patient   RxID:   [id number]    Immunization History:  Influenza Immunization History:    Influenza:  historical (05/08/2010)  Immunizations Administered:  Pneumonia Vaccine:    Vaccine Type: Pneumovax (Medicare)    Site: left deltoid    Mfr: Merck    Dose: 0.5 ml    Route: IM    Given by: Randell Loop CMA    Exp. Date: 10/09/2011    Lot #: [id number]    VIS given: 06/27/09 version given May 17, 2010.

## 2010-08-22 NOTE — Progress Notes (Signed)
Summary: COUGH/CB  Phone Note Call from Patient Call back at [phone]   Caller: Patient Call For: Laura [name] Summary of Call: Laura [name] HAS HAD A COLD WITH DRAINAGE AND CONGESTION NOW HAS A COUGH WANTS SOMETHING CALLED IN SHE HAS BEEN USING ROBITUSSIN WANTS SOMETHING TO [facility] [place] [phone] Initial call taken by: Lacinda Axon,  January 13, 2010 12:15 PM  Follow-up for Phone Call        Spoke with Laura [name].  She states that she developed "horrible cold" x 3 wsk ago with head congestion, nasal congestion and aches.  Those symptoms have resolved, but now she c/o dry cough and has tried robitussin and this is not helping.  She would like rx called in.  Allergic to codiene.  Please advise, thanks! Follow-up by: Vernie Murders,  January 13, 2010 12:20 PM  Additional Follow-up for Phone Call Additional follow up Details #1::        per SN---zpak   tussionex and rec using mucinex dm  1-2 by mouth two times a day with plenty of fluids.  [facility] [place]---lmomtcb for Laura [name] to make her aware of rx at pharmacy and other meds. Randell Loop CMA  January 13, 2010 2:24 PM     Additional Follow-up for Phone Call Additional follow up Details #2::    Laura [name] returned my call and is aware of SN recs and meds at the pharmacy Randell Loop CMA  January 13, 2010 2:29 PM   New/Updated Medications: ZITHROMAX Z-PAK 250 MG TABS (AZITHROMYCIN) take as directed TUSSIONEX PENNKINETIC ER 8-10 MG/5ML LQCR (CHLORPHENIRAMINE-HYDROCODONE) 1 tsp by mouth every 12 hours as needed for cough Prescriptions: TUSSIONEX PENNKINETIC ER 8-10 MG/5ML LQCR (CHLORPHENIRAMINE-HYDROCODONE) 1 tsp by mouth every 12 hours as needed for cough  #4 oz x 0   Entered by:   Randell Loop CMA   Authorized by:   Michele Mcalpine MD   Signed by:   Randell Loop CMA on 01/13/2010   Method used:   Telephoned to ...       [facility] [phone]* (retail)       [address]       [place], [place]  [address]       Ph: [phone]       Fax:  [phone]   RxID:   [id number] Christena Deem Z-PAK 250 MG TABS (AZITHROMYCIN) take as directed  #1 pak x 0   Entered by:   Randell Loop CMA   Authorized by:   Michele Mcalpine MD   Signed by:   Randell Loop CMA on 01/13/2010   Method used:   Electronically to        [facility] [phone]* (retail)       [address]       [place], [place]  [address]       Ph: [phone]       Fax: [phone]   RxID:   [id number]

## 2010-08-22 NOTE — Miscellaneous (Signed)
Summary: BONE DENSITY  Clinical Lists Changes  Orders: Added new Test order of T-Lumbar Vertebral Assessment (77082) - Signed 

## 2010-08-22 NOTE — Progress Notes (Signed)
Summary: head congestion > z pak  Phone Note Call from Patient   Caller: Patient Call For: nadel Summary of Call: head congestion would like z pak [facility] [phone] Initial call taken by: Rickard Patience,  June 13, 2010 9:13 AM  Follow-up for Phone Call        Spoke with pt and she is c/o head congestion, sinus pressure, headache x 1 week. pt states she did hace body aches and a sore throat but this has subsided. Pt has been using nasal spray, MMW, and mucinex. Pt is requesting an rx for a ZPAK. Please advise. Carron Curie CMA  June 13, 2010 9:27 AM   Additional Follow-up for Phone Call Additional follow up Details #1::        per SN----ok for pt to have zpak #1 take as directed. thanks Randell Loop CMA  June 13, 2010 9:54 AM   Called, spoke with pt.  She was informed of above recs per SN and aware rx sent to [facility] in [place].  She verbalized understanding. Additional Follow-up by: Gweneth Dimitri RN,  June 13, 2010 10:00 AM    New/Updated Medications: ZITHROMAX Z-PAK 250 MG TABS (AZITHROMYCIN) take as directed

## 2010-08-22 NOTE — Progress Notes (Signed)
Summary: rx  Phone Note Call from Patient Call back at Home Phone [phone]   Caller: Patient Call For: nadel Reason for Call: Talk to Nurse Summary of Call: Amoxicillan is giving her diarrhea, need something else called in for her.   [facility] [place] [phone] Initial call taken by: Eugene Gavia,  March 23, 2010 1:36 PM  Follow-up for Phone Call        called and spoke with pt---she stated that the augmentin caused her to have diarrhea and nausea---she does feel a littel better today with no fever.  head congestion is worse today with her ears being stopped up.  she is going to cont the mucinex---she never did take the pred taper either due to an eye condition that she has.  please advise if you would like to change the abx to something else. Randell Loop CMA  March 23, 2010 2:16 PM   Additional Follow-up for Phone Call Additional follow up Details #1::        per SN---recs are to cont the mucinex, use nasal saline mist every 1-2 hours while awake and change to flagyl 250mg   #21  generic is ok  1 by mouth three times a day until gone.  called and spoke with pt and she is aware of med change.  she stated that she has already been using the nasal saline  mist but she will cont. Randell Loop CMA  March 23, 2010 4:05 PM    New Allergies: ! AUGMENTIN ! PREDNISONE New/Updated Medications: METRONIDAZOLE 250 MG TABS (METRONIDAZOLE) take one tablet by mouth three times a day until gone New Allergies: ! AUGMENTIN ! PREDNISONEPrescriptions: METRONIDAZOLE 250 MG TABS (METRONIDAZOLE) take one tablet by mouth three times a day until gone  #21 x 0   Entered by:   Randell Loop CMA   Authorized by:   Michele Mcalpine MD   Signed by:   Randell Loop CMA on 03/23/2010   Method used:   Electronically to        [facility] [phone]* (retail)       [address]       [place], [place]  [address]       Ph: [phone]       Fax: [phone]   RxID:   [id number]

## 2010-08-22 NOTE — Miscellaneous (Signed)
Summary: Flu Shot/[facility]  Flu Shot/[facility]   Imported By: Esmeralda Links D'jimraou 05/13/2010 10:27:47  _____________________________________________________________________  External Attachment:    Type:   Image     Comment:   External Document

## 2010-08-22 NOTE — Assessment & Plan Note (Signed)
Summary: CPX/LWA   CC:  5 month ROV & review of mult medical problems....  History of Present Illness: 70 y/o WF here for a follow up visit... she has prob Sarcoidosis w/ hx Uveitis and BHA on CXR... we are following a "watchful waiting" protocol...she continues to feel well- no cough, sputum, CP, or SOB...    ~  July 06, 2009:  she has been doing well w/o new complaints or concerns... works part time in [employer]... her granddau is now 16mo old, & she doesn't want to get her Flu shot until after the holidays...  Lipids controlled on diet, Simva40, Fish Oil... GI is stable... no new ortho complants...   ~  November 16, 2009:  she has noted a recent URI w/ "crud", some discomfort when swallowing, swollen glands, fatigue, etc... denies f/c/s, not much cough/ phlegm, no CP etc... feels sl better w/ Tylenol Rx over weekend but we decided to Rx w/ Depo & MMW...  otherw feeling well> no chest symptoms- ie cough, sputum, dyspnea, etc... due for f/u CXR for her presumed Sarcoid, check ACE, etc...  Chol looks good on Simva40;  reflux controlled on Nexium/ Zantac;  she is due for a 54yr f/u BMD...    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 s/p right cataract surg 2/09 and no active uveitis seen...  ~  4/11:  she tells me she was seen 10/10 & doing well, may need left cataract surg soon.  R/O SARCOIDOSIS (ICD-135) - Hx uveitis w/ eval at [facility], and subseq f/u CXR w/ CTChest showing bilat hilar adenopathy, and w/u showing neg PPD, ACE=53, sed=13... prev PFT's 1/08 showed FVC 3.75 (111%), FEV1 2.74 (102%), %1sec=73, mid-flows=70%... being followed w/ serial films and "watchful waiting"... there is a family hx of sarcoidosis.  ~   we had a f/u CXR in Feb09- streaky opacity right mid lung zone, similar to 2008...   ~  CT Chest 7/08 & 6/09 showed mediastinal > hilar adenopathy some w/ calcif (generally smaller than 1/08), area of atx/  scarring right mid zone (infer RUL) & scat <1cm nodules in lower zones w/o change...  ~  CXR 2/10 showed bilat hilar prominence & scarring appears the same- NAD... labs all WNL w/ ACE=56.  ~  CXR 4/11 showed mild bilat hilar prominence, scarring, NAD- osteopenia 7 DJD in TSpine... ACE= 37.  MITRAL VALVE PROLAPSE (ICD-424.0) - on ASA 81mg /d... clinical dx w/ 2DEcho 1999 showing flat closure but no frank prolapse...  HYPERCHOLESTEROLEMIA (ICD-272.0) - on SIMVASTATIN 40mg /d now...  ~  FLP 1/08 on Lip10 showed TChol 183, TG 122, HDL 45, LDL 114...   ~  FLP 2/09 on Lip10 showed TChol 183, TG 189, HDL 39, LDL 107... continue diet & change to Simva40.   ~  FLP 5/09 on Simva40 showed TChol 185, TG 163, HDL 36, LDL 117... she wants to stay on Simva40.  ~  FLP 2/10 showed TChol 176, Tg 160, HDL 39, LDL 105... rec> continue same.  ~  FLP 4/11 showed TChol 160, TG 131, HDL 41, LDL 93  GERD (ICD-530.81) - on NEXIUM 40mg /d & ZANTAC 300mg Qhs...last EGD 6/07 w/ 3cmHH, stricture, gastitis (HPylori neg)... I offered to change her Nexium to a generic but she declined- "DrPatterson told me never to change this med"... she notes occas nocturnal reflux symptoms.  IRRITABLE BOWEL SYNDROME (ICD-564.1) COLONIC POLYPS, HX OF (ICD-V12.72) - last colonoscopy was 6/07 by [facility]  showing divertics & polyps (adenomatous)... f/u planned 56yrs...  UTI'S, CHRONIC (ICD-599.0) - eval by DrOttelin for urology... now off the Macrodantin and using cranberry juice without recurrent UTI.  DEGENERATIVE JOINT DISEASE (ICD-715.90) - eval by DrDalldorf w/ mod DJD in knees & hx of torn left meniscus... 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 overall improved now.  LOW BACK PAIN, CHRONIC (ICD-724.2)  OSTEOPENIA (ICD-733.90) -   ~  BMD in 2005 showed TScore -1.2 in Spine, & -0.7 in [facility]...  ~  BMD 5/09 showed TScores -1.5 in Spine & -1.1  in [place]... on calcium, MVI, Vit D...  ~  BMD sched 5/11> pending  ANXIETY (ICD-300.00)  Hx of BASAL CELL SKIN CANCER - she still sees her Derm in [place] yearly...  Health Maintenance:  ~  GI:  followed by DrPatterson w/ colon as above...  ~  GYN:  followed by DrRichardson & doing well by pt's report... [facility]...  ~  Immunizations:  she gets yearly flu shots... has PNEUMOVAX in 1998, ?Tetanus.   Allergies: 1)  ! Codeine  Comments:  Nurse/Medical Assistant: The patient's medications and allergies were reviewed with the patient and were updated in the Medication and Allergy Lists.  Past History:  Past Medical History: Hx of UVEITIS (ICD-364.3) R/O SARCOIDOSIS (ICD-135) MITRAL VALVE PROLAPSE (ICD-424.0) HYPERCHOLESTEROLEMIA (ICD-272.0) GERD (ICD-530.81) IRRITABLE BOWEL SYNDROME (ICD-564.1) COLONIC POLYPS, HX OF (ICD-V12.72) UTI'S, CHRONIC (ICD-599.0) DEGENERATIVE JOINT DISEASE (ICD-715.90) LOW BACK PAIN, CHRONIC (ICD-724.2) OSTEOPENIA (ICD-733.90) ANXIETY (ICD-300.00)  Past Surgical History: S/P Hysterectomy S/P removal of inflammatory mass from right antecubital fossa 4/00 by DrSypher S/P basal cell skin cancer removed S/P right cataract surg 2/09 at [facility]  Family History: Reviewed history from 12/12/2007 and no changes required. Family hx is pos for Sarcoidosis in sister & brother Nephew w/ Wegener's granulomatosis mother died at age 63 from cancer father died at age 4 from sepsis  2 siblings 1 died at age 22 from cancer 1 died at age 46 from a stroke  Social History: Reviewed history from 12/12/2007 and no changes required. pt is retired married to Pierce Polivka 2 children  Review of Systems       The patient complains of sore throat, hoarseness, gas/bloating, indigestion/heartburn, and arthritis.  The patient denies fever, chills, sweats, anorexia, fatigue, weakness, malaise, weight loss, sleep disorder, blurring, diplopia, eye  irritation, eye discharge, vision loss, eye pain, photophobia, earache, ear discharge, tinnitus, decreased hearing, nasal congestion, nosebleeds, chest pain, palpitations, syncope, dyspnea on exertion, orthopnea, PND, peripheral edema, cough, dyspnea at rest, excessive sputum, hemoptysis, wheezing, pleurisy, nausea, vomiting, diarrhea, constipation, change in bowel habits, abdominal pain, melena, hematochezia, jaundice, dysphagia, odynophagia, dysuria, hematuria, urinary frequency, urinary hesitancy, nocturia, incontinence, back pain, joint pain, joint swelling, muscle cramps, muscle weakness, stiffness, sciatica, restless legs, leg pain at night, leg pain with exertion, rash, itching, dryness, suspicious lesions, paralysis, paresthesias, seizures, tremors, vertigo, transient blindness, frequent falls, frequent headaches, difficulty walking, depression, anxiety, memory loss, confusion, cold intolerance, heat intolerance, polydipsia, polyphagia, polyuria, unusual weight change, abnormal bruising, bleeding, enlarged lymph nodes, urticaria, allergic rash, hay fever, and recurrent infections.    Vital Signs:  Patient profile:   70 year old female Height:      69 inches Weight:      182.25 pounds BMI:     27.01 O2 Sat:      97 % on Room air Temp:  97.5 degrees F oral Pulse rate:   82 / minute BP sitting:   138 / 82  (right arm) Cuff size:   regular  Vitals Entered By: Randell Loop CMA (November 16, 2009 9:49 AM)  O2 Sat at Rest %:  97 O2 Flow:  Room air CC: 5 month ROV & review of mult medical problems... Is Patient Diabetic? No Pain Assessment Patient in pain? yes      Onset of pain  sore throat//tenderness in lymph nodes in neck  x 1 week Comments no changes in meds today   Physical Exam  Additional Exam:  WD, WN, 70 y/o WF in NAD... GENERAL:  Alert & oriented; pleasant & cooperative... HEENT:  [place]/AT, EOM-wnl, PERRLA, EACs-clear, TMs-wnl, NOSE-clear, THROAT-clear & wnl. NECK:  Supple  w/ fairROM; no JVD; normal carotid impulses w/o bruits; no thyromegaly or nodules palpated; no lymphadenopathy. CHEST:  Clear to P & A; without wheezes/ rales/ or rhonchi. HEART:  Regular Rhythm; without murmurs/ rubs/ or gallops. ABDOMEN:  Soft & nontender; normal bowel sounds; no organomegaly or masses detected. EXT: without deformities, mild arthritic changes; no varicose veins/ +venous insuffic/ no edema. NEURO:  CN's intact;  no focal neuro deficits... DERM:  No lesions noted; no rash etc...    CXR  Procedure date:  11/16/2009  Findings:      CHEST - 2 VIEW Comparison: 09/08/2008   Findings: Cardiac size is stable.  Mild bilateral hilar prominence again noted.  Scarring in the right midlung is stable.  No acute infiltrate or pleural effusion.  No pulmonary edema.  Stable osteopenia and mild degenerative changes thoracic spine.   IMPRESSION: No active disease.  Stable mild hilar prominence.  Stable scarring right mid lung.  Osteopenia and mild degenerative changes thoracic spine.   Read By:  Kennieth Francois,  M.D.    MISC. Report  Procedure date:  11/16/2009  Findings:      BMP (METABOL)   Sodium                    143 mEq/L                   135-145   Potassium                 4.4 mEq/L                   3.5-5.1   Chloride                  104 mEq/L                   96-112   Carbon Dioxide            32 mEq/L                    19-32   Glucose                   99 mg/dL                    16-10   BUN                       10 mg/dL                    9-60   Creatinine  0.7 mg/dL                   1.6-1.0   Calcium                   9.2 mg/dL                   9.6-04.5   GFR                       88.11 mL/min                >60  Hepatic/Liver Function Panel (HEPATIC)   Total Bilirubin           1.0 mg/dL                   4.0-9.8   Direct Bilirubin          0.3 mg/dL                   1.1-9.1   Alkaline Phosphatase      86 U/L                       39-117   AST                       27 U/L                      0-37   ALT                       22 U/L                      0-35   Total Protein             7.1 g/dL                    4.7-8.2   Albumin                   4.0 g/dL                    9.5-6.2  CBC Platelet w/Diff (CBCD)   White Cell Count          6.2 K/uL                    4.5-10.5   Red Cell Count            4.69 Mil/uL                 3.87-5.11   Hemoglobin                14.8 g/dL                   13.0-86.5   Hematocrit                42.9 %                      36.0-46.0   MCV                       91.5 fl                     78.0-100.0  Platelet Count            249.0 K/uL                  150.0-400.0   Neutrophil %              66.6 %                      43.0-77.0   Lymphocyte %              23.0 %                      12.0-46.0   Monocyte %                6.8 %                       3.0-12.0   Eosinophils%              3.3 %                       0.0-5.0   Basophils %               0.3 %                       0.0-3.0  Comments:      TSH (TSH)   FastTSH                   1.44 uIU/mL                 0.35-5.50  Lipid Panel (LIPID)   Cholesterol               160 mg/dL                   0-454   Triglycerides             131.0 mg/dL                 0.9-811.9   HDL                       14.78 mg/dL                 >29.56   LDL Cholesterol           93 mg/dL                    2-13                 UDip w/Micro (URINE)   Color                     LT. YELLOW   Clarity                   CLEAR                       Clear   Specific Gravity          1.010                       1.000 - 1.030   Urine Ph                  6.0  5.0-8.0   Protein                   NEGATIVE                    Negative   Urine Glucose             NEGATIVE                    Negative   Ketones                   NEGATIVE                    Negative   Urine Bilirubin           NEGATIVE                    Negative    Blood                     NEGATIVE                    Negative   Urobilinogen              0.2                         0.0 - 1.0   Leukocyte Esterace        NEGATIVE                    Negative   Nitrite                   NEGATIVE                    Negative   Urine Epith               Rare(0-4/hpf)               Rare(0-4/hpf)  Angiotensin 1 Converting Enzyme (16109)  Angiotensin 1 Converting Enzyme                             37 U/L                      9-67   Impression & Recommendations:  Problem # 1:  Hx of UVEITIS (ICD-364.3) Followed at [facility] ophthalmology dept... no active problem.  Problem # 2:  R/O SARCOIDOSIS (ICD-135) CXR unchanged... ACE stable... she is essentially asymptomatic. Orders: T-2 View CXR (71020TC) TLB-BMP (Basic Metabolic Panel-BMET) (80048-METABOL) TLB-Hepatic/Liver Function Pnl (80076-HEPATIC) TLB-CBC Platelet - w/Differential (85025-CBCD) TLB-TSH (Thyroid Stimulating Hormone) (84443-TSH) TLB-Lipid Panel (80061-LIPID) T-Angiotensin i-Converting Enzyme (60454-09811) TLB-Udip w/ Micro (81001-URINE)  Problem # 3:  HYPERCHOLESTEROLEMIA (ICD-272.0) FLP looks good-  continue same med + diet Rx. Her updated medication list for this problem includes:    Simvastatin 40 Mg Tabs (Simvastatin) .[name]... 1 tab by mouth at bedtime  Problem # 4:  GERD (ICD-530.81) GERD stable on meds-  continue same. Her updated medication list for this problem includes:    Nexium 40 Mg Cpdr (Esomeprazole magnesium) .[name]... 1 tab by mouth in am taken 30 min before a meal...    Ranitidine Hcl 300 Mg Caps (Ranitidine hcl) .[name]... 1 tab by mouth at bedtime as directed...  Problem # 5:  UTI'S, CHRONIC (  ICD-599.0) She denies urinary symptoms and UA clear...  Problem # 6:  LOW BACK PAIN, CHRONIC (ICD-724.2) No recent problems... Her updated medication list for this problem includes:    Aspirin Adult Low Strength 81 Mg Tbec (Aspirin) .[name]... Take 1 tab by mouth once daily...  Problem # 7:   OSTEOPENIA (ICD-733.90) Due for f/u BMD> pending...  Problem # 8:  OTHER MEDICAL PROBLEMS AS NOTED>>>  Complete Medication List: 1)  Aspirin Adult Low Strength 81 Mg Tbec (Aspirin) .... Take 1 tab by mouth once daily.Marland KitchenMarland Kitchen 2)  Simvastatin 40 Mg Tabs (Simvastatin) .[name].. 1 tab by mouth at bedtime 3)  Fish Oil 1000 Mg Caps (Omega-3 fatty acids) .... Take 1 tablet by mouth once a day 4)  Nexium 40 Mg Cpdr (Esomeprazole magnesium) .[name].. 1 tab by mouth in am taken 30 min before a meal... 5)  Ranitidine Hcl 300 Mg Caps (Ranitidine hcl) .[name].. 1 tab by mouth at bedtime as directed... 6)  Caltrate 600+d 600-400 Mg-unit Tabs (Calcium carbonate-vitamin d) .... Take 1 tab by mouth two times a day... 7)  Multivitamins Tabs (Multiple vitamin) .... Take 1 tab by mouth once daily.Marland KitchenMarland Kitchen 8)  Vitamin D 1000 Unit Tabs (Cholecalciferol) .... Take 1 tablet by mouth once a day 9)  Magic Mouthwash  .... 1 tsp gargle & swallow up to 4 times daily as needed...  Other Orders: Prescription Created Electronically [id number]) T-Bone Densitometry [phone]) Depo- Medrol 80mg  (J1040) Admin of Therapeutic Inj  intramuscular or subcutaneous (09811)  Patient Instructions: 1)  Today we updated your med list- see below.... 2)  We refilled your meds for 2011... 3)  Today we did your follow up CXR & FASTING blood work... please call the "phone tree" in a few days for your lab results.Marland KitchenMarland Kitchen 4)  For your upper resp symptoms:  we gave you a Depo shot for the imflammation & a Rx for Magic Mouthwash to keep on hand... 5)  We will also sched a bone density test & call you w/ this result... 6)  Call for any problems.Marland KitchenMarland Kitchen 7)  Please schedule a follow-up appointment in 6 months. Prescriptions: RANITIDINE HCL 300 MG  CAPS (RANITIDINE HCL) 1 tab by mouth at bedtime as directed...  #30 x prn   Entered and Authorized by:   Michele Mcalpine MD   Signed by:   Michele Mcalpine MD on 11/16/2009   Method used:   Print then Give to Patient   RxID:    [id number] NEXIUM 40 MG  CPDR (ESOMEPRAZOLE MAGNESIUM) 1 tab by mouth in am taken 30 min before a meal...  #30 x prn   Entered and Authorized by:   Michele Mcalpine MD   Signed by:   Michele Mcalpine MD on 11/16/2009   Method used:   Print then Give to Patient   RxID:   [id number] SIMVASTATIN 40 MG  TABS (SIMVASTATIN) 1 tab by mouth at bedtime  #30 x prn   Entered and Authorized by:   Michele Mcalpine MD   Signed by:   Michele Mcalpine MD on 11/16/2009   Method used:   Print then Give to Patient   RxID:   [id number] MAGIC MOUTHWASH 1 tsp gargle & swallow up to 4 times daily as needed...  #4 oz x prn   Entered and Authorized by:   Michele Mcalpine MD   Signed by:   Michele Mcalpine MD on 11/16/2009   Method used:   Print then Give to  Patient   RxID:   [id number] RANITIDINE HCL 300 MG  CAPS (RANITIDINE HCL) 1 tab by mouth at bedtime as directed...  #30 x prn   Entered and Authorized by:   Michele Mcalpine MD   Signed by:   Michele Mcalpine MD on 11/16/2009   Method used:   Print then Give to Patient   RxID:   [id number] NEXIUM 40 MG  CPDR (ESOMEPRAZOLE MAGNESIUM) o1 tab by mouth in am taken 30 min before a meal...  #30 x prn   Entered and Authorized by:   Michele Mcalpine MD   Signed by:   Michele Mcalpine MD on 11/16/2009   Method used:   Print then Give to Patient   RxID:   [id number] SIMVASTATIN 40 MG  TABS (SIMVASTATIN) 1 tab by mouth at bedtime  #30 x prn   Entered and Authorized by:   Michele Mcalpine MD   Signed by:   Michele Mcalpine MD on 11/16/2009   Method used:   Print then Give to Patient   RxID:   [id number]     Medication Administration  Injection # 1:    Medication: Depo- Medrol 80mg     Diagnosis: Hx of UVEITIS (ICD-364.3)    Route: IM    Site: LUOQ gluteus    Exp Date: 05/2012    Lot #: [id number]    Mfr: [employer]    Patient tolerated injection without complications    Given by: Randell Loop CMA (November 16, 2009 10:57 AM)  Orders Added: 1)   Prescription Created Electronically [G8553] 2)  Est. Patient Level V [99215] 3)  T-2 View CXR [71020TC] 4)  TLB-BMP (Basic Metabolic Panel-BMET) [80048-METABOL] 5)  TLB-Hepatic/Liver Function Pnl [80076-HEPATIC] 6)  TLB-CBC Platelet - w/Differential [85025-CBCD] 7)  TLB-TSH (Thyroid Stimulating Hormone) [84443-TSH] 8)  TLB-Lipid Panel [80061-LIPID] 9)  T-Angiotensin i-Converting Enzyme [82164-81390] 10)  TLB-Udip w/ Micro [81001-URINE] 11)  T-Bone Densitometry [77080] 12)  Depo- Medrol 80mg  [J1040] 13)  Admin of Therapeutic Inj  intramuscular or subcutaneous [42595]

## 2010-11-07 ENCOUNTER — Other Ambulatory Visit: Payer: Self-pay | Admitting: Obstetrics and Gynecology

## 2010-11-07 DIAGNOSIS — Z1231 Encounter for screening mammogram for malignant neoplasm of breast: Secondary | ICD-10-CM

## 2010-11-15 ENCOUNTER — Encounter: Payer: Self-pay | Admitting: Pulmonary Disease

## 2010-11-16 ENCOUNTER — Telehealth: Payer: Self-pay | Admitting: Pulmonary Disease

## 2010-11-16 NOTE — Telephone Encounter (Signed)
Pt states that she and her husband both were around a lot of people on Saturday and several of them have since had positive strep tests. She states that both she and her husband are coming in tomorrow for physicals but wanted to know if something should be called in before then. She states she has had headaches, body aches, fever and severe sore throat. Pls advise. Allergies  Allergen Reactions  . Codeine     REACTION: nausea  . ZOX:[id number] Acid+Aspartame     REACTION: causes diarrhea  . Prednisone     REACTION: unable to take this med due to an eye condition

## 2010-11-16 NOTE — Telephone Encounter (Signed)
Per SN---we can just see her  Tomorrow at her cpx appt and we can check her then. thanks

## 2010-11-16 NOTE — Telephone Encounter (Signed)
Pt is aware SN will address this at OV on Fri., 4/27. Pt had verbalized understanding of this.

## 2010-11-17 ENCOUNTER — Ambulatory Visit: Payer: Self-pay

## 2010-11-17 ENCOUNTER — Encounter: Payer: Self-pay | Admitting: Pulmonary Disease

## 2010-11-17 ENCOUNTER — Ambulatory Visit (INDEPENDENT_AMBULATORY_CARE_PROVIDER_SITE_OTHER)
Admission: RE | Admit: 2010-11-17 | Discharge: 2010-11-17 | Disposition: A | Discharge status: Home / Self Care | Payer: Self-pay | Source: Ambulatory Visit | Attending: Pulmonary Disease | Admitting: Pulmonary Disease

## 2010-11-17 ENCOUNTER — Ambulatory Visit (INDEPENDENT_AMBULATORY_CARE_PROVIDER_SITE_OTHER): Payer: Medicare Other | Admitting: Pulmonary Disease

## 2010-11-17 ENCOUNTER — Other Ambulatory Visit (INDEPENDENT_AMBULATORY_CARE_PROVIDER_SITE_OTHER): Payer: Medicare Other

## 2010-11-17 DIAGNOSIS — E7800 Pure hypercholesterolemia, unspecified: Secondary | ICD-10-CM

## 2010-11-17 DIAGNOSIS — K219 Gastro-esophageal reflux disease without esophagitis: Secondary | ICD-10-CM

## 2010-11-17 DIAGNOSIS — M545 Low back pain, unspecified: Secondary | ICD-10-CM

## 2010-11-17 DIAGNOSIS — J40 Bronchitis, not specified as acute or chronic: Secondary | ICD-10-CM

## 2010-11-17 DIAGNOSIS — I059 Rheumatic mitral valve disease, unspecified: Secondary | ICD-10-CM

## 2010-11-17 DIAGNOSIS — F411 Generalized anxiety disorder: Secondary | ICD-10-CM

## 2010-11-17 DIAGNOSIS — M949 Disorder of cartilage, unspecified: Secondary | ICD-10-CM

## 2010-11-17 DIAGNOSIS — M1990 Unspecified osteoarthritis, unspecified site: Secondary | ICD-10-CM

## 2010-11-17 DIAGNOSIS — M899 Disorder of bone, unspecified: Secondary | ICD-10-CM

## 2010-11-17 LAB — BASIC METABOLIC PANEL
BUN: 10 mg/dL (ref 6–23)
CO2: 31 mEq/L (ref 19–32)
Calcium: 9.6 mg/dL (ref 8.4–10.5)
GFR: 99.21 mL/min (ref >60.00)
Glucose, Bld: 92 mg/dL (ref 70–99)
Potassium: 4.2 mEq/L (ref 3.5–5.1)
Sodium: 142 mEq/L (ref 135–145)

## 2010-11-17 LAB — LIPID PANEL
HDL: 42.6 mg/dL (ref >39.00)
LDL Cholesterol: 102 mg/dL — ABNORMAL HIGH (ref 0–99)
Total CHOL/HDL Ratio: 4
Triglycerides: 148 mg/dL (ref 0.0–149.0)

## 2010-11-17 LAB — HEPATIC FUNCTION PANEL
AST: 21 U/L (ref 0–37)
Albumin: 3.7 g/dL (ref 3.5–5.2)
Alkaline Phosphatase: 84 U/L (ref 39–117)
Total Protein: 7.3 g/dL (ref 6.0–8.3)

## 2010-11-17 LAB — TSH: TSH: 1.61 u[IU]/mL (ref 0.35–5.50)

## 2010-11-17 LAB — CBC WITH DIFFERENTIAL/PLATELET
Basophils Absolute: 0 10*3/uL (ref 0.0–0.1)
Eosinophils Relative: 1.8 % (ref 0.0–5.0)
Lymphocytes Relative: 19.1 % (ref 12.0–46.0)
Monocytes Relative: 6.8 % (ref 3.0–12.0)
Platelets: 271 10*3/uL (ref 150.0–400.0)
RDW: 13.3 % (ref 11.5–14.6)
WBC: 8 10*3/uL (ref 4.5–10.5)

## 2010-11-17 MED ORDER — MOXIFLOXACIN HCL 400 MG PO TABS: 400.0000 mg | ORAL_TABLET | Freq: Every day | ORAL | Status: AC

## 2010-11-17 MED ORDER — METHYLPREDNISOLONE ACETATE 80 MG/ML IJ SUSP
80.0000 mg | Freq: Once | INTRAMUSCULAR | Status: AC
  Administered 2010-11-17: 80 mg via INTRA_ARTICULAR

## 2010-11-17 MED ORDER — ESOMEPRAZOLE MAGNESIUM 40 MG PO CPDR: 40.0000 mg | DELAYED_RELEASE_CAPSULE | Freq: Every day | ORAL | Status: DC

## 2010-11-17 MED ORDER — NYSTATIN 100000 UNIT/ML MT SUSP: 500000.0000 [IU] | Freq: Four times a day (QID) | OROMUCOSAL | Status: DC

## 2010-11-17 MED ORDER — FIRST-DUKES MOUTHWASH MT SUSP: OROMUCOSAL | Status: DC

## 2010-11-17 MED ORDER — SIMVASTATIN 40 MG PO TABS: 40.0000 mg | ORAL_TABLET | Freq: Every day | ORAL | Status: DC

## 2010-11-17 NOTE — Progress Notes (Signed)
Subjective:    Patient ID: Laura [name], female    DOB: 07/16/1941, 70 y.o.   MRN: [id number]  HPI 70 y/o WF here for a follow up visit... she has prob Sarcoidosis w/ hx Uveitis and BHA on CXR... we are following a "watchful waiting" protocol...she continues to feel well- no cough, sputum, CP, or SOB...   ~  November 16, 2009:  she has noted a recent URI w/ "crud", some discomfort when swallowing, swollen glands, fatigue, etc... denies f/c/s, not much cough/ phlegm, no CP etc... feels sl better w/ Tylenol Rx over weekend but we decided to Rx w/ Depo & MMW...  otherw feeling well> no chest symptoms- ie cough, sputum, dyspnea, etc... due for f/u CXR for her presumed Sarcoid, check ACE, etc...  Chol looks good on Simva40;  reflux controlled on Nexium/ Zantac;  she is due for a 40yr f/u BMD...  ~  May 17, 2010:  she reports incr stress w/ sister Louise's situation- offered Rebeca Allegra Rx but she declines... she had BMD 5/11- min osteopenia & stable on Calcium, MVI, Vit D... no new complaints or concerns- breathing stable;  Chol controlled on diet + Simva20;  GI is stable & up to date;  she had the 2011 Flu shot, due for PNEUMOVAX, & discussed Shingles vaccine as well.  ~  November 17, 2010:  37mo ROV & presents w/ 5d hx of URI- frontal HA, aching, sore all over, sore throat, sinus draining yellow, swollen glands, & low grade temp 100; denies cough/ sputum/ CP; we decided to check CXR- stable RML scarring, prom hila c/w adenopathy, NAD; & Labs- all essent wnl; Rx w/ Depo, Avelox, Mucinex, MMW...    Hx Sarcoid- stable via CXR;  Denies CP/ palpit, SOB, edema;  Lipids controlled on Simva40;  GI stable on Nexium & Zantac as needed;  DJD/ LBP/ Osteopenia- stable...         Problem List:  Hx of UVEITIS (ICD-364.3) - eval at [facility] since 2001, given steroid injection in 2002... s/p right cataract surg 2/09 and no active uveitis seen... ~  4/11:  she tells me she was seen 10/10 & doing well, may need left cataract surg  soon.  R/O SARCOIDOSIS (ICD-135) - Hx uveitis w/ eval at [facility], and subseq f/u CXR w/ CTChest showing bilat hilar adenopathy, and w/u showing neg PPD, ACE=53, sed=13... prev PFT's 1/08 showed FVC 3.75 (111%), FEV1 2.74 (102%), %1sec=73, mid-flows=70%... being followed w/ serial films and "watchful waiting"... there is a family hx of sarcoidosis. ~   we had a f/u CXR in Feb09- streaky opacity right mid lung zone, similar to 2008...  ~  CT Chest 7/08 & 6/09 showed mediastinal > hilar adenopathy some w/ calcif (generally smaller than 1/08), area of atx/ scarring right mid zone (infer RUL) & scat <1cm nodules in lower zones w/o change... ~  CXR 2/10 showed bilat hilar prominence & scarring appears the same- NAD... labs all WNL w/ ACE=56. ~  CXR 4/11 showed mild bilat hilar prominence, scarring, NAD- osteopenia & DJD in TSpine... ACE= 37. ~  CXR 4/12 showed stable bilat hilar prominence, scarring, NAD...  MITRAL VALVE PROLAPSE (ICD-424.0) - on ASA 81mg /d... clinical dx w/ 2DEcho 1999 showing flat closure but no frank prolapse...  HYPERCHOLESTEROLEMIA (ICD-272.0) - on SIMVASTATIN 40mg /d now... ~  FLP 1/08 on Lip10 showed TChol 183, TG 122, HDL 45, LDL 114...  ~  FLP 2/09 on Lip10 showed TChol 183, TG 189, HDL 39, LDL 107... continue diet &  change to Simva40.  ~  FLP 5/09 on Simva40 showed TChol 185, TG 163, HDL 36, LDL 117... she wants to stay on Simva40. ~  FLP 2/10 showed TChol 176, Tg 160, HDL 39, LDL 105... rec> continue same. ~  FLP 4/11 showed TChol 160, TG 131, HDL 41, LDL 93 ~  FLP 4/12 showed TChol 174, TG 148, HDL 43, LDL 102  GERD (ICD-530.81) - on NEXIUM 40mg /d & ZANTAC 300mg Qhs...last EGD 6/07 w/ 3cmHH, stricture, gastitis (HPylori neg)... I offered to change her Nexium to a generic but she declined- "DrPatterson told me never to change this med"... she notes occas nocturnal reflux symptoms.  IRRITABLE BOWEL SYNDROME (ICD-564.1) COLONIC POLYPS, HX OF (ICD-V12.72) - last colonoscopy was  6/07 by DrPatterson showing divertics & polyps (adenomatous)... f/u planned 100yrs...  UTI'S, CHRONIC (ICD-599.0) - eval by DrOttelin for urology... now off the Macrodantin and using cranberry juice without recurrent UTI.  DEGENERATIVE JOINT DISEASE (ICD-715.90) - eval by DrDalldorf w/ mod DJD in knees & hx of torn left meniscus... 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 overall improved now.  LOW BACK PAIN, CHRONIC (ICD-724.2)  OSTEOPENIA (ICD-733.90) - on Calcium, MVI, Vit D... ~  BMD in 2005 showed TScore -1.2 in Spine, & -0.7 in [facility]. ~  BMD 5/09 showed TScores -1.5 in Spine & -1.1 in [facility]. ~  BMD sched 5/11 showed TScores -1.2 in Spine, and -1.2 in [facility].  ANXIETY (ICD-300.00)  Hx of BASAL CELL SKIN CANCER - she still sees her Derm in [place] yearly...  Health Maintenance: ~  GI:  followed by DrPatterson w/ colon as above... ~  GYN:  followed by DrRichardson & doing well by pt's report... [facility]... ~  Immunizations:  she gets yearly flu shots;  has PNEUMOVAX in 1998 & repeated 10/11 (age 71);  ?last Tetanus;  we discussed Shingles vaccine (she had bout of shingles involving her right hip & leg ~age30) & she will decide.    Past Surgical History  Procedure Date  . Vesicovaginal fistula closure w/ tah   . Basal cell carcinoma excision   . Cataract extraction     right  . Removal of mass from rt antecubital fossa     Outpatient Encounter Prescriptions as of 11/17/2010  Medication Sig Dispense Refill  . aspirin 81 MG tablet Take 81 mg by mouth daily.        . Calcium Carbonate (CALTRATE 600 PO) Take 1 tablet by mouth 2 (two) times daily.        . Cholecalciferol (VITAMIN D) 1000 UNITS capsule Take 1,000 Units by mouth daily.        [name] esomeprazole (NEXIUM) 40 MG capsule Take 40 mg by mouth daily before breakfast.        . Multiple Vitamins-Minerals  (MULTIVITAL) tablet Take 1 tablet by mouth daily.        . Omega-3 Fatty Acids (FISH OIL) 1000 MG CAPS Take 1 capsule by mouth daily.        . simvastatin (ZOCOR) 40 MG tablet Take 40 mg by mouth at bedtime.          Allergies  Allergen Reactions  . Codeine     REACTION: nausea  . ZOX:[id number] Acid+Aspartame     REACTION: causes diarrhea  . Prednisone     REACTION: unable to take this med due to an eye condition  Review of Systems         See HPI - all other systems neg except as noted... The patient denies anorexia, fever, weight loss, weight gain, vision loss, decreased hearing, hoarseness, chest pain, syncope, dyspnea on exertion, peripheral edema, prolonged cough, headaches, hemoptysis, abdominal pain, melena, hematochezia, severe indigestion/heartburn, hematuria, incontinence, muscle weakness, suspicious skin lesions, transient blindness, difficulty walking, depression, unusual weight change, abnormal bleeding, enlarged lymph nodes, and angioedema.     Objective:   Physical Exam    WD, WN, 70 y/o WF in NAD... GENERAL:  Alert & oriented; pleasant & cooperative... HEENT:  /AT, EOM-wnl, PERRLA, EACs-clear, TMs-wnl, NOSE-clear, THROAT-clear & wnl. NECK:  Supple w/ fairROM; no JVD; normal carotid impulses w/o bruits; no thyromegaly or nodules palpated; no lymphadenopathy. CHEST:  Clear to P & A; without wheezes/ rales/ or rhonchi. HEART:  Regular Rhythm; without murmurs/ rubs/ or gallops. ABDOMEN:  Soft & nontender; normal bowel sounds; no organomegaly or masses detected. EXT: without deformities, mild arthritic changes; no varicose veins/ +venous insuffic/ no edema. NEURO:  CN's intact;  no focal neuro deficits... DERM:  No lesions noted; no rash etc...   Assessment & Plan:   URI>  we decided to check CXR- stable RML scarring, prom hila c/w adenopathy, NAD; & Labs- all essent wnl; Rx w/ Depo, Avelox, Mucinex, MMW...  SARCOID>  CXR stable & she  remains asymptomatic...  CHOL>  Stable on the Simva40 + diet...  GERD>  On Nexium & Zantac, she is symptomatically controlled...  DJD/ LBP/ Osteopenia>  As noted she remains stable on current meds & exercise program...  Other medical issues as noted.Marland KitchenMarland Kitchen

## 2010-11-17 NOTE — Patient Instructions (Signed)
Today we updated your med list on our new EPIC system...    We refilled your meds per request...  For your URI/ Sinus/ HA>    We wrote a new prescription for AVELOX to take one daily til gone...    We also wrote for the Magic Mouthwash to use up to 4 times daily for the sore throat...    Don't forget the fluids, Tylenol, Nasal Saline & Mucinex OTC if needed...  Today we did your follow up CXR & fasting blood work...    Please call the PHONE TREE in a few days for your results...    Dial [id number] & when prompted enter your patient number followed by the # symbol...    Your patient number is:  [id number]#  Call for any questions... Let's plan a brief 37mo follow up visit.Marland KitchenMarland Kitchen

## 2010-11-26 ENCOUNTER — Encounter: Payer: Self-pay | Admitting: Pulmonary Disease

## 2010-12-04 ENCOUNTER — Encounter: Payer: Self-pay | Admitting: Gastroenterology

## 2010-12-04 ENCOUNTER — Telehealth: Payer: Self-pay | Admitting: Gastroenterology

## 2010-12-04 NOTE — Telephone Encounter (Signed)
Error

## 2010-12-04 NOTE — Telephone Encounter (Signed)
Could not find any documentation on pt- have sent for chart. Pt with hx of GERD and HH. ECL 03/17/2001- diverticulosis and 01/01/2006 with Benign Polypectomy and diverticulosis. Pt  Reports last Wed. Pm not feeling well and quesy then she had diarrhea and a loss of appetite that lasted until Friday. Over the weekend, she reports a total loss of appetite. She is drinking liquids and eating rice and toast, but anything stronger, she has diarrhea again. Pt is on Nexium and takes Zantac QHS. Pt was on Z pack 3 weeks ago. Pt would like to be seen. Pt advised to follow BRAT diet and will see AMY at 0830am tomorrow.

## 2010-12-05 ENCOUNTER — Telehealth: Payer: Self-pay | Admitting: Physician Assistant

## 2010-12-05 ENCOUNTER — Ambulatory Visit (INDEPENDENT_AMBULATORY_CARE_PROVIDER_SITE_OTHER): Payer: Medicare Other | Admitting: Physician Assistant

## 2010-12-05 ENCOUNTER — Encounter: Payer: Self-pay | Admitting: Physician Assistant

## 2010-12-05 ENCOUNTER — Other Ambulatory Visit (INDEPENDENT_AMBULATORY_CARE_PROVIDER_SITE_OTHER): Payer: Medicare Other

## 2010-12-05 VITALS — BP 134/80 | HR 74 | Temp 98.6°F | Ht 70.0 in | Wt 172.0 lb

## 2010-12-05 DIAGNOSIS — K209 Esophagitis, unspecified without bleeding: Secondary | ICD-10-CM

## 2010-12-05 DIAGNOSIS — R1011 Right upper quadrant pain: Secondary | ICD-10-CM

## 2010-12-05 DIAGNOSIS — K219 Gastro-esophageal reflux disease without esophagitis: Secondary | ICD-10-CM

## 2010-12-05 DIAGNOSIS — Z86010 Personal history of colonic polyps: Secondary | ICD-10-CM

## 2010-12-05 DIAGNOSIS — Z800 Family history of malignant neoplasm of digestive organs: Secondary | ICD-10-CM

## 2010-12-05 LAB — HEPATIC FUNCTION PANEL
Albumin: 3.7 g/dL (ref 3.5–5.2)
Total Protein: 7.1 g/dL (ref 6.0–8.3)

## 2010-12-05 LAB — CBC WITH DIFFERENTIAL/PLATELET
Basophils Relative: 0.2 % (ref 0.0–3.0)
Eosinophils Relative: 1.5 % (ref 0.0–5.0)
Hemoglobin: 14.9 g/dL (ref 12.0–15.0)
Lymphocytes Relative: 20.6 % (ref 12.0–46.0)
Monocytes Relative: 6.8 % (ref 3.0–12.0)
Neutro Abs: 4.3 10*3/uL (ref 1.4–7.7)
RBC: 4.69 Mil/uL (ref 3.87–5.11)

## 2010-12-05 MED ORDER — PEG-KCL-NACL-NASULF-NA ASC-C 100 G PO SOLR: 1.0000 | Freq: Once | ORAL | Status: AC

## 2010-12-05 MED ORDER — SUCRALFATE 1 GM/10ML PO SUSP: ORAL | Status: DC

## 2010-12-05 NOTE — Patient Instructions (Signed)
Please go to the basement level to have your labs drawn.  We scheduled the Ultrasound at [facility]. Directions provided. We scheduled the Endoscopy and Colonoscopy with Dr. Jarold Motto. Directions and brochure provided. We sent prescriptions for the colonoscopy prep and the Carafate to your pharmacy, [facility].

## 2010-12-05 NOTE — Progress Notes (Signed)
Subjective:    Patient ID: Laura [name], female    DOB: 1940-08-20, 70 y.o.   MRN: [id number]  HPI Kassidie is a pleasant 70 year old white female known to Dr. Jarold Motto who has undergone prior procedures for history of chronic GERD and adenomatous colon polyps. Her last endoscopy was done in June of 2007 showing a benign distal esophageal stricture and a 3 cm hiatal hernia. She had some evidence of gastritis was tested for H. pylori and this was negative. Colonoscopy done on the same day with one cecal polyp removed which was adenomatous. She also has left colon diverticular disease. Patient also has family history of colon cancer in her sister.  She comes in today with acute GI symptoms onset about 6 days ago with what she is describing as so severe reflux. This started with nausea diarrhea heartburn and indigestion. She says she stopped eating for a few days and absolutely had no appetite. Since then she has had ongoing heartburn day and night and some odynophagia as well though this does not sound severe. She is not vomiting but has been eating a very bland diet. Has not had any documented fever or chills. She says the diarrhea stopped over 24 hours ago and she had a uniform stool today. She has not noted any melena or hematochezia. On Saturday evening which was 3 nights ago she developed an acute episode of abdominal bloating pain and pressure but a full feeling in the epigastrium and right upper quadrant which radiated around in her back she was quite uncomfortable with this and says it lasted a couple of hours and she still sore in that area. The only medication she has been on was a Z-Pak which he took at the end of April. She is in on chronic Nexium 40 mg every morning and takes Zantac 300 each bedtime. She's not been using any regular aspirin or NSAIDs.    Review of Systems  Constitutional: Positive for appetite change.  HENT: Positive for trouble swallowing.   Eyes: Negative.     Gastrointestinal: Positive for nausea, abdominal pain, diarrhea and abdominal distention.  Genitourinary: Negative.   Musculoskeletal: Negative.   Skin: Negative.   Neurological: Negative.   Hematological: Negative.   Psychiatric/Behavioral: Negative.        Objective:   Physical Exam Well-developed white female in no acute distress, pleasant, alert and oriented x3  HEENT; nontraumatic normocephalic EOMI PERRLA sclera anicteric Neck;; Supple no JVD  , Cardiovascular; regular rate and rhythm with S1-S2 no murmur rub or gallop  Pulmonary; clear bilaterally  Abdomen ; soft mildly tender in the epigastrium and right upper quadrant there is no guarding or rebound no palpable mass or hepatosplenomegaly bowel sounds are active  Rectal; not don e Extremities; no clubbing cyanosis or edema skin warm and dry benign Psych ;mood and affect normal and appropriate.        Assessment & Plan:  #66 70 year old female with chronic GERD with acute exacerbation/esophagitis. May be antibiotic related.  Plan; Increase Nexium to 40 mg twice daily 2 weeks then back to once daily as previous Add Carafate slurry 1 g 4 times daily between meals and at bedtime x2 weeks Continue bland diet until symptoms improved  Schedule for upper endoscopy with Dr. Jarold Motto, procedure discussed in detail with the patient and she is agreeable to proceed. She has not had any evidence of Barrett's esophagus but states that she generally has this done at the time of her colonoscopy, and would like  to be reevaluated at this time.  #2 Family history of colon cancer.  #3 Personal history of adenomatous colon polyps, last colonoscopy approximately 5 years ago. Will schedule for followup colonoscopy with Dr. Jarold Motto, procedure was discussed in detail with the patient and she is agreeable to proceed  #4 Acute epigastric and right upper quadrant abdominal pain with nausea. Rule out gastroenteritis rule out antibiotic-induced symptoms,  rule out gallbladder disease.  Plan; check CBC and hepatic panel today Schedule upper abdominal ultrasound.

## 2010-12-06 ENCOUNTER — Other Ambulatory Visit: Payer: Self-pay | Admitting: *Deleted

## 2010-12-06 ENCOUNTER — Telehealth: Payer: Self-pay | Admitting: *Deleted

## 2010-12-06 MED ORDER — SUCRALFATE 1 G PO TABS: 1.0000 g | ORAL_TABLET | Freq: Four times a day (QID) | ORAL | Status: DC

## 2010-12-06 NOTE — Telephone Encounter (Signed)
[facility] and they reported that the pt would have to try and fail a course of the Sucralfate tablets .  I spoke to [facility] PA and she said the pt can try the tablets.  She can take 1 tab and crush it with 5 cc's of water, enough to make a slurry and swallow ,drizzle down her throat.  I will do the prescription for 14 days with one refill. ( # 56 with 1 RF. )   Called pt today to inform her of the medication change.  I explained the instructions per Amy Esterwood PA-C.

## 2010-12-06 NOTE — Telephone Encounter (Signed)
Message copied by Jesse Fall on Wed Dec 06, 2010  8:38 AM ------      Message from: Mike Gip      Created: Tue Dec 05, 2010  3:42 PM       Please let Prapti know her labs are normal

## 2010-12-06 NOTE — Telephone Encounter (Signed)
Patient notified of lab results as per Amy Esterwood, PA 

## 2010-12-06 NOTE — Telephone Encounter (Signed)
Did prior auth for the Carafate.  Ins company will not cover the Carafate until she fails a course of Sucralfate.  I let Amy Esterwood PA know and she said it is okay for the pt to try the Sucralfate tablets. She can take 1 tab and crush it in 5 cc's of water and make a slurry out of it and swallow it.  I called the pt to inform her what the Ins company and Amy said. The pt said she is willing to try this.  She thanked me for calling her last evening and for my help.  I sent the prescription to [facility].

## 2010-12-07 NOTE — Progress Notes (Signed)
Agree with assessment and plan of care.  Iva Boop, MD, Clementeen Graham

## 2010-12-08 ENCOUNTER — Ambulatory Visit (HOSPITAL_COMMUNITY)
Admission: RE | Admit: 2010-12-08 | Discharge: 2010-12-08 | Disposition: A | Discharge status: Home / Self Care | Payer: Medicare Other | Source: Ambulatory Visit | Attending: Physician Assistant | Admitting: Physician Assistant

## 2010-12-08 ENCOUNTER — Telehealth: Payer: Self-pay | Admitting: *Deleted

## 2010-12-08 DIAGNOSIS — R110 Nausea: Secondary | ICD-10-CM | POA: Insufficient documentation

## 2010-12-08 DIAGNOSIS — Z86010 Personal history of colonic polyps: Secondary | ICD-10-CM

## 2010-12-08 DIAGNOSIS — N2889 Other specified disorders of kidney and ureter: Secondary | ICD-10-CM

## 2010-12-08 DIAGNOSIS — N289 Disorder of kidney and ureter, unspecified: Secondary | ICD-10-CM | POA: Insufficient documentation

## 2010-12-08 DIAGNOSIS — R1011 Right upper quadrant pain: Secondary | ICD-10-CM

## 2010-12-08 DIAGNOSIS — Z860101 Personal history of adenomatous and serrated colon polyps: Secondary | ICD-10-CM

## 2010-12-08 DIAGNOSIS — K2900 Acute gastritis without bleeding: Secondary | ICD-10-CM | POA: Insufficient documentation

## 2010-12-08 DIAGNOSIS — K219 Gastro-esophageal reflux disease without esophagitis: Secondary | ICD-10-CM

## 2010-12-08 DIAGNOSIS — Z800 Family history of malignant neoplasm of digestive organs: Secondary | ICD-10-CM

## 2010-12-08 DIAGNOSIS — K209 Esophagitis, unspecified without bleeding: Secondary | ICD-10-CM

## 2010-12-08 NOTE — Op Note (Signed)
NAME:  Laura [name], Laura [name]                 ACCOUNT NO.:  [id number]   MEDICAL RECORD NO.:  [id number]          PATIENT TYPE:  AMB   LOCATION:  DAY                          FACILITY:  [facility]   PHYSICIAN:  Mark C. Vernie Ammons, M.D.  DATE OF BIRTH:  October 02, 1940   DATE OF PROCEDURE:  04/26/2006  DATE OF DISCHARGE:                                 OPERATIVE REPORT   PREOPERATIVE DIAGNOSES:  Stress urinary continence.   POSTOPERATIVE DIAGNOSES:  Stress urinary continence.   PROCEDURE:  Treat obturator sling.   SURGEON:  Mark C. Vernie Ammons, M.D.   ANESTHESIA:  General.   BLOOD LOSS:  Was approximately 5 mL.   SPECIMENS:  None.   DRAINS:  A 16-French Foley catheter.   COMPLICATIONS:  None.   INDICATIONS:  The patient is 70 year old white female who was found to have  a significant prolapse and an associated grade 3 cystocele.  She initially  reported no incontinence; however, I felt that the cystocele and uterine  prolapse were likely kinking the urethra which was which was clinically  causing a slowed urinary stream and difficulty emptying, and felt that  masking stress incontinence was slightly probable.  She underwent  urodynamics that did reveal, with the uterus replaced in its normal anatomic  position, and her cystocele reduced, that not only did the patient then  demonstrates stress incontinence but also voided with a much better stream  and was extremely pleased with that finding.  Based on this, the patient has  elected to proceed with a hysterectomy and repair of her cystocele by Dr.  Senaida Ores, and with a trans-obturator sling being placed by myself.  She  understands risks, complications, alternatives and limitations.   DESCRIPTION OF OPERATION:  After an informed consent, the patient went to  the major OR and was placed on the table and administered general  anesthesia, and then placed in the dorsal lithotomy position, after which  her genitalia and vagina were sterilely prepped and  draped.  A Foley  catheter had been in place and she had undergone her hysterectomy and  anterior repair, as well as bilateral salpingo-oophorectomy by Dr.  Senaida Ores the details of which will be dictated by her separately.   Through the same trans-vaginal incision that was made for the anterior  repair,  I dissected lateral to the urethra at the mid-urethral level on  both sides and slightly further.  I then chose a location 5 cm lateral to  the midline at the level of the clitoris that was palpated to be at the  medial border of the obturator fossa.  This was marked and the bladder was  then fully drained.  Stab incisions were then made in these locations and  the trans-obturator trocar was then passed through the skin incision,  through the trans-obturator fascia and behind the symphysis pubis and  directed out at the mid-urethral level by digital guidance.  This was  performed first on the left and then right sides, without difficulty.  I  then attached the sling material to the trocars and drew this back through  the skin incisions.   The Foley catheter was removed and a 21-French cystoscope was introduced  into the bladder with a 70-degree lens.  The bladder was fully inspected and  noted be free of any tumor, stones or inflammatory lesions.  The ureteral  orifices appeared to be normal in configuration and position.  There was no  evidence of perforation, injury or foreign body within the bladder.  The  bladder neck appeared normal.  The urethra also appeared normal.  The  cystoscope was therefore removed and the Foley catheter was replaced.   I then positioned the sling at the mid-urethral level and placed forceps  beneath the urethra and removed the plastic sheathing off of each side of  the tape.  I then removed the forceps and noted good position of the sling,  with no tension.  The excess sling material was then excised at the skin  level and the skin incisions were closed  with Dermabond.  The vaginal  incision was then closed by Dr. Senaida Ores, again dictated by her.   She tolerated this portion of the procedure well, with no intraoperative  complications.   She will be observed in the hospital and discharged home when felt  appropriate by Dr. Senaida Ores with instructions.      Mark C. Vernie Ammons, M.D.  Electronically Signed     MCO/MEDQ  D:  04/26/2006  T:  04/27/2006  Job:  [id number]

## 2010-12-08 NOTE — Telephone Encounter (Signed)
Radiology tech called with call report on Ultrasound done this morning, 12-08-2010.  Hypoechoic left upper renal pole cortical mass, with imaging features that may suggest complicated cyst, although solid mass is not excluded. CT Abdomen with contrast according to renal mass protocol exam is recommended for further evaluation.

## 2010-12-08 NOTE — Letter (Signed)
August 22, 2006    Dr. Curt Jews  [facility]  [address] [facility] [address], [address]th [address]  [facility].  [place], [place] [address]   RE:  REINE, BRISTOW  MRN:  [id number]  /  DOB:  05-22-1941   Dear Dr. Dub Mikes,   Ms. Adin Lariccia is a 70 year old female whom I follow for general  medical purposes.  She has lived many years in [place] and spent some  time in [place] and reports to me that while she was in [place] she had  some ophthalmologic problems, and was seen down there and diagnosed with  uveitis.  She also indicates that she was more recently told of  questionable glaucoma and early cataracts that did not require any  surgery as yet.   On my recent assessment she had an abnormal chest x-ray that showed some  calcified hilar nodes, and on CT scan was found to have some mild  parenchymal scarring and calcified and noncalcified mediastinal and  hilar adenopathy, favoring the possibility of sarcoidosis.  She had a  negative PPD, normal pulmonary function studies, and normal ACE level.   I discussed these finding with Ms. Caslin and indicated to her that I  did not believe she needed any treatment at the present time.  I  indicated that it is possible that her previous diagnosis of uveitis may  have been a manifestation of some underlying sarcoid and she was very  interested in getting a second opinion from you regarding her full  ophthalmologic situation.  She has an appointment to see you on September 11, 2006 at 9:00 a.m.   Thank you very much for your evaluation.   Ms. Aaberg's other problems include some mild mitral valve prolapse;  mild hypercholesterolemia; history of gastroesophageal reflux disease  and irritable bowel syndrome.  She had a hysterectomy and previous  bladder surgery in 2007.   CURRENT MEDICATIONS:  1. Lipitor 10 mg p.o. daily.  2. Nexium 40 mg p.o. daily.   Of interest, she has a brother who was diagnosed  with sarcoidosis in  1986 on a positive lymph node biopsy and was treated briefly with  prednisone.    Sincerely,      Lonzo Cloud. Kriste Basque, MD  Electronically Signed    SMN/MedQ  DD: 08/22/2006  DT: 08/22/2006  Job #: [id number]

## 2010-12-08 NOTE — Op Note (Signed)
Laura [name], Laura [name]                 ACCOUNT NO.:  [id number]   MEDICAL RECORD NO.:  [id number]          PATIENT TYPE:  AMB   LOCATION:  DAY                          FACILITY:  [facility]   PHYSICIAN:  Huel Cote, M.D. DATE OF BIRTH:  11/14/1940   DATE OF PROCEDURE:  04/26/2006  DATE OF DISCHARGE:                                 OPERATIVE REPORT   .   PREOPERATIVE DIAGNOSES:  1. Pelvic prolapse.  2. Cystocele.  3. Rectocele.   POSTOPERATIVE DIAGNOSIS:  1. Pelvic prolapse.  2. Cystocele.  3. Rectocele.   PROCEDURE:  Total vaginal hysterectomy, bilateral salpingo-oophorectomy,  anterior-posterior repair.  Trans obturator sling placement by  Dr. Vernie Ammons with cystoscopy.   SURGEON:  Huel Cote, M.D./Mark C. Vernie Ammons, M.D.   ASSISTANT:  Sherron Monday, MD   ANESTHESIA:  General.   SPECIMENS:  Uterus, cervix, ovaries and tubes were sent to pathology.   ESTIMATED BLOOD LOSS:  150 cc.   URINE OUTPUT:  850 cc clear urine.   FLUIDS REPLACED:  2000 cc LR.   FINDINGS:  There was a small uterus with moderate prolapse.  Large cystocele  with small rectocele.  Tubes and ovaries were grossly within normal limits,  sent to pathology.   PROCEDURE:  The patient was taken to the operating room where general  anesthesia was obtained without difficulty.  She was then prepped and draped  in normal sterile fashion in the dorsal lithotomy position. A speculum was  placed within the vagina.  Cervix was identified and grasped with [place]  tenaculums x2 on the anterior and posterior lip.  The cervix was then  injected with a dilute solution of Pitressin and was incised  circumferentially with Bovie cautery.  Sharp dissection was then utilized to  dissect the overlying vaginal mucosa off the cervix itself.  The posterior  cul-de-sac was entered sharply with Mayo scissors and the Bonnano speculum  replaced the posterior.  The uterosacral ligaments were then taken down  bilaterally,  clamped with Zeppelin clamps, transected and suture ligated  with 0 Vicryl; these were held in hemostats.  Attention was then turned  anteriorly where the anterior cul-de-sac was entered sharply.  A Deaver  retractor was placed anteriorly.  Thus the uterus was isolated from the  bladder and the rectum.  The remaining broad ligament was then taken down  sequentially with each bite clamped with a Zeppelin clamp, transected and  suture ligated with 0 Vicryl.  This was continued up to the level of the  utero-ovarian ligament. When this was reached, these bites were secured with  both a free tie of 0 Vicryl and suture ligature of the same.  Thus the  uterus and cervix were completely amputated and handed off to pathology. As  the patient had requested just preoperatively, the ovaries and tubes were  inspected and as she has requested removal if possible, a Babcock clamp was  utilized to grasp the ovary and the fallopian tube and gently retract  downward.  A right angle Zeppelin clamp was then placed around this adnexa  and it was amputated and  handed off to pathology.  The infundibulopelvic  ligament was then secured with both a free tie of 0 Vicryl and a suture  ligature of the same; this was achieved bilaterally.  The ovaries were quite  atrophic and appeared normal for a postmenopausal female.  At this point no  active bleeding was noted.  The posterior cuff was bleeding slightly,  therefore the Bonnano speculum was removed and the posterior cuff was run in  a running locked fashion with a 2-0 Vicryl.  This achieved good hemostasis.  The uterosacral ligaments were then reapproximated with 0 Vicryl suture  ligature and were also secured to one another with the previously held  ligatures.  At this point, after careful inspection, no active bleeding was  noted.  Therefore all instruments and sponges were removed from the vagina.  The anterior aspect of the vaginal cuff was grasped with Allis  clamps.  These were then retracted downwards and the anterior mucosa injected with a  dilute solution of vasopressin.  This then was utilized the Metzenbaum  scissors to underscore the midline up to mucosa and dissected off the  underlying  pubovesical fascia.  The flaps were retracted laterally and the  pubovesical fascia dissected medially.  This was accomplished and the  pubovesical fascia was then reapproximated along the midline with several  plication sutures of 0 Vicryl.  At this point Dr. Ihor Gully was called in  and performed his transobturator sling procedure.  Cystoscopy revealed no  trauma to the bladder, although I do not believe the ureteral jets were  specifically visualized.  The transobturator sling will be dictated  separately by Dr. Vernie Ammons at the conclusion of his procedure.  The vaginal  mucosa was then closed with 2-0 Vicryl in a running locked fashion, as well  as the vaginal cuff.  There was no active bleeding noted at this point.  Attention was then turned to the posterior vaginal wall which was grasped at  the introitus with Allis clamps.  A small amount of mucosa was trimmed away  and the mucosa itself was underscored with Metzenbaum scissors along the  midline with the mucosal flaps retracted laterally.  The rectovaginal fascia  was then dissected off the mucosal flaps and retracted medially.  This was  then reapproximated with several interrupted sutures of 0 Vicryl.  The  excess mucosa was trimmed away from the mucosal flaps.  The mucosa itself  was then closed with 2-0 Vicryl in a running locked fashion.  A rectal exam  was performed prior to closure of this mucosa and good reduction of small  rectocele noted as well as no injury to the rectum      Huel Cote, M.D.  Electronically Signed     KR/MEDQ  D:  04/26/2006  T:  04/27/2006  Job:  [id number]

## 2010-12-08 NOTE — H&P (Signed)
Laura [name], Laura [name]                 ACCOUNT NO.:  [id number]   MEDICAL RECORD NO.:  [id number]          PATIENT TYPE:  OIB   LOCATION:  1607                         FACILITY:  [facility]   PHYSICIAN:  Huel Cote, M.D. DATE OF BIRTH:  11/06/1940   DATE OF ADMISSION:  04/26/2006  DATE OF DISCHARGE:                                HISTORY & PHYSICAL   The patient is a 70 year old, G2, P2 whose coming in for a scheduled total  vaginal hysterectomy, possible bilateral salpingo-oophorectomy and anterior  posterior repair. The patient was evaluated by her medical doctor who  referred her to gynecology when she began to experience increasing symptoms  related to uterine prolapse which have been present for several years.  Currently the patient has prolapse which extends beyond the introitus and  inhibits her urination and causes generalized discomfort from the position  of the uterus. She did not have any active stress urinary incontinence;  however, with her uterus and cystocele reduced did have some unmasked stress  urinary incontinence therefore will be having a concurrent sling placement  by Dr. Ihor Gully.   PAST MEDICAL HISTORY:  Significant for gastroesophageal reflux,  hypercholesterolemia and mitral valve prolapse for which she does take  prophylactic antibiotics.   PAST SURGICAL HISTORY:  Right breast lipoma and an arm surgery, no abdominal  surgery.   PAST OBSTETRICAL HISTORY:  Significant for 2 vaginal deliveries, largest 8  pounds 9 ounces.   PAST GYN HISTORY:  No abnormal Pap smears.   ALLERGIES:  PREDNISONE, CODEINE and ADHESIVE TAPE.   MEDICATIONS:  Nexium and Lipitor.   FAMILY HISTORY:  Has a sister with colon cancer and a mother with melanoma.  There is no breast cancer noted.   SOCIAL HISTORY:  She is a nonsmoker, is married.   PHYSICAL EXAMINATION:  VITAL SIGNS:  Her weight is 181 pounds, height is 5  feet 9, blood pressure 142/90.  CARDIAC:  Regular rate  and rhythm.  LUNGS:  Clear.  ABDOMEN:  Soft and nontender.  PELVIC:  She has normal external genitalia except for there is a large  cystocele visible at the introitus as well as the cervix with moderate  prolapse. The uterus is small and the adnexa have no masses. In the standing  position, the patient's cystocele bulges excessively to the introitus and  the cervix is just behind that.   Recent Pap smear was normal. Recent colonoscopy was normal.   The patient was counseled extensively as to the risks and benefits of  proceeding with surgery including bleeding, infection and possible damage to  bowel and bladder. She understands that should she have a complication  involving adjacent organ damage that she would likely need an abdominal  incision to correct this and discussion was had with her about whether to  remove her ovaries or conserve them. After carefully considering the risks  and benefits of both options, the patient elects to retain her ovaries if  they appear within normal limits. She had been evaluated separately by Dr.  Ihor Gully who does feel that she has some stress  urinary incontinence  unmasked when her cystocele and prolapse are reduced therefore she will have  a concurrent sling placement at the time of procedure by him. Again after  discussing all these issues, the patient is agreeable to proceeding with the  surgery as stated.      Huel Cote, M.D.  Electronically Signed     KR/MEDQ  D:  04/25/2006  T:  04/27/2006  Job:  [id number]

## 2010-12-08 NOTE — Discharge Summary (Signed)
NAMETALEIGHA, PINSON                 ACCOUNT NO.:  [id number]   MEDICAL RECORD NO.:  [id number]          PATIENT TYPE:  INP   LOCATION:  1607                         FACILITY:  [facility]   PHYSICIAN:  Malachi Pro. Ambrose Mantle, M.D. DATE OF BIRTH:  20-Nov-1940   DATE OF ADMISSION:  04/26/2006  DATE OF DISCHARGE:  04/28/2006                                 DISCHARGE SUMMARY   HISTORY OF PRESENT ILLNESS:  This is a 70 year old, white female, who was  admitted to the hospital for vaginal hysterectomy, anterior and posterior  repair, bilateral salpingo-oophorectomy and sling procedure.   HOSPITAL COURSE:  The patient underwent a vaginal hysterectomy, BSO and  anterior and posterior repair by Dr. Senaida Ores.  Dr. Vernie Ammons did the  transobturator sling procedure.  The procedures were uncomplicated.  The  patient did well postoperatively.  On the first postoperative day she  elected not to go home, was doing well on the second postoperative day and  was ready for discharge.  She was passing flatus, urinating well, tolerating  a diet and ambulating without difficulty.  Initial hemoglobin 15, hematocrit  43.7, white count 5200, platelet count 251,000.  Followup hemoglobin 13.7.  Comprehensive metabolic profile was normal except for a bilirubin 1.6.  Blood group and type was O+.  Chest x-ray done in December, 2006, at Dr.  Jodelle Green office showed no active lung disease.  An EKG done in December,  2006, was a normal EKG.  Pathology report is pending.   DISCHARGE DIAGNOSES:  1. Pelvic prolapse.  2. Cystocele and rectocele.  3. Stress urinary incontinence.   OPERATION:  1. Vaginal hysterectomy.  2. Bilateral salpingo-oophorectomy.  3. Anterior and posterior repair.  4. Transobturator sling procedure.   FINAL CONDITION:  Improved.   DISCHARGE INSTRUCTIONS:  Instructions include our regular discharge  instructions.  No heavy lifting, no strenuous activity.  Call with any  elevated temperature of 100.4 or  greater.  Call with any unusual problems.  Call Dr. Senaida Ores on Monday, April 29, 2006, to get instructions about  when to resume her aspirin and to get final instructions on when to return  for her first postoperative exam.   DISCHARGE MEDICATIONS:  Darvocet-N 100, #24 tablets, 1 every 4-6 hours as  needed for pain is given at discharge.      Malachi Pro. Ambrose Mantle, M.D.  Electronically Signed     TFH/MEDQ  D:  04/28/2006  T:  04/29/2006  Job:  [id number]   cc:   Veverly Fells. Vernie Ammons, M.D.  Fax: [phone]   Huel Cote, M.D.  Fax: [phone]

## 2010-12-12 NOTE — Telephone Encounter (Signed)
I LEFT A MESSAGE TODAY FOR PT TO CALL us BACK REGARDING HER ABDOMINAL US. SHE HAS A LEFT RENAL MASS VS. COPLEX CYST. I DO NOT THINK THIS IS CAUSING HER CURRENT SXS. SEE HOW SHE IS FEELING. SHE NEEDS TO BE SCHEDULED FOR A CT ABDOMEN WITH RENAL MASS PROTOCOL. THANK-YOU, Amry Cathy

## 2010-12-12 NOTE — Telephone Encounter (Signed)
Notified pt on her cell that I ordered a CT scan of Abdomen with Renal Protocol for 5/23/12at 1:30pm, be there at 1:00pm. This will be at [facility] CT on [facility]. There is no oral contrast- pt will drink water when she gets there. Pt is to NPO 4 hours prior to CT. Pt stated understanding.

## 2010-12-12 NOTE — Telephone Encounter (Signed)
Addended by: Graciella Freer on: 12/12/2010 01:22 PM   Modules accepted: Orders

## 2010-12-13 ENCOUNTER — Ambulatory Visit (INDEPENDENT_AMBULATORY_CARE_PROVIDER_SITE_OTHER)
Admission: RE | Admit: 2010-12-13 | Discharge: 2010-12-13 | Disposition: A | Discharge status: Home / Self Care | Payer: Medicare Other | Source: Ambulatory Visit | Attending: Physician Assistant | Admitting: Physician Assistant

## 2010-12-13 DIAGNOSIS — N2889 Other specified disorders of kidney and ureter: Secondary | ICD-10-CM

## 2010-12-13 DIAGNOSIS — N289 Disorder of kidney and ureter, unspecified: Secondary | ICD-10-CM

## 2010-12-13 MED ORDER — IOHEXOL 300 MG/ML  SOLN
100.0000 mL | Freq: Once | INTRAMUSCULAR | Status: AC | PRN
  Administered 2010-12-13: 100 mL via INTRAVENOUS

## 2010-12-20 ENCOUNTER — Telehealth: Payer: Self-pay | Admitting: Internal Medicine

## 2010-12-20 ENCOUNTER — Encounter: Payer: Self-pay | Admitting: Gastroenterology

## 2010-12-20 ENCOUNTER — Ambulatory Visit (AMBULATORY_SURGERY_CENTER): Payer: Medicare Other | Admitting: Gastroenterology

## 2010-12-20 DIAGNOSIS — K2970 Gastritis, unspecified, without bleeding: Secondary | ICD-10-CM

## 2010-12-20 DIAGNOSIS — K2940 Chronic atrophic gastritis without bleeding: Secondary | ICD-10-CM

## 2010-12-20 DIAGNOSIS — K219 Gastro-esophageal reflux disease without esophagitis: Secondary | ICD-10-CM

## 2010-12-20 DIAGNOSIS — Z860100 Personal history of colon polyps, unspecified: Secondary | ICD-10-CM

## 2010-12-20 DIAGNOSIS — R109 Unspecified abdominal pain: Secondary | ICD-10-CM

## 2010-12-20 DIAGNOSIS — D126 Benign neoplasm of colon, unspecified: Secondary | ICD-10-CM

## 2010-12-20 DIAGNOSIS — K589 Irritable bowel syndrome without diarrhea: Secondary | ICD-10-CM

## 2010-12-20 DIAGNOSIS — K317 Polyp of stomach and duodenum: Secondary | ICD-10-CM

## 2010-12-20 DIAGNOSIS — K5730 Diverticulosis of large intestine without perforation or abscess without bleeding: Secondary | ICD-10-CM

## 2010-12-20 MED ORDER — SODIUM CHLORIDE 0.9 % IV SOLN: 500.0000 mL | INTRAVENOUS | Status: DC

## 2010-12-20 NOTE — Telephone Encounter (Signed)
Had EGD and colonoscopy earlier today. I spoke to her and husband at about 6PM. Having upper abdominal bloating and occasional sharp cramps. Belching slightly with partial relief. + flatus. Denies that this is like pre-procedure pains but chart review indicates similar history. No back pain, fever or vomiting  Advised warm liquids, walking and position change and to call back if not better in 2-3 hours or if worse, fever. To stay on liquids until feels significantly better.

## 2010-12-20 NOTE — Patient Instructions (Signed)
Please read all of the handouts given to you by your recovery room nurse.   We will contact you by mail in about 2 weeks regarding your colon and gastric biopsies; however, if the clotest is positive, we will call you.   Please , try to eat a high fiber diet due to your diverticulosis.  Please continue your acid reducing medication, and you may resume your routine medications.   If your have any questions or concerns, please call us at [phone]. Thank-you.

## 2010-12-21 ENCOUNTER — Telehealth: Payer: Self-pay | Admitting: Internal Medicine

## 2010-12-21 ENCOUNTER — Telehealth: Payer: Self-pay | Admitting: *Deleted

## 2010-12-21 LAB — HELICOBACTER PYLORI SCREEN-BIOPSY: UREASE: NEGATIVE

## 2010-12-21 NOTE — Telephone Encounter (Signed)
Follow up Call- Patient questions:  Do you have a fever, pain , or abdominal swelling? no Pain Score  0 *  Have you tolerated food without any problems? yes  Have you been able to return to your normal activities? yes  Do you have any questions about your discharge instructions: Diet   yes Medications  no Follow up visit  no  Do you have questions or concerns about your Care? no  Actions: * If pain score is 4 or above:   Pt. Was instructed to start out with light breakfast and then return to regular diet today. Pt. Had questions about other test ordered(C.T.) SCAN instructed pt. To call office for results, verbalized understanding.

## 2010-12-21 NOTE — Telephone Encounter (Signed)
Dr Arlyce Dice Doc of the day today, reviewed the Ultrasound and the CT scan the patient wanted the results on.  He did this in [employer] absence.  He said the left kidney was clear with no lesion or mass.  He also said the lungs did how the history of the sarcoidosis.  Two of  the nodules did progress since a chest CT from 6/09.  This could be a scarring which would show this progression. It is suggested she has a repeat CT  To assess the disease progression. I called the patient and advised her of the above information.  She thanked me for calling and has no new complaints.

## 2011-01-08 ENCOUNTER — Telehealth: Payer: Self-pay | Admitting: Gastroenterology

## 2011-01-08 ENCOUNTER — Encounter: Payer: Self-pay | Admitting: Gastroenterology

## 2011-01-08 NOTE — Telephone Encounter (Signed)
Notified pt I need to have Dr Jarold Motto review the path 1st, then I will call her back.

## 2011-05-17 DIAGNOSIS — H43813 Vitreous degeneration, bilateral: Secondary | ICD-10-CM | POA: Insufficient documentation

## 2011-05-17 DIAGNOSIS — H02839 Dermatochalasis of unspecified eye, unspecified eyelid: Secondary | ICD-10-CM | POA: Insufficient documentation

## 2011-05-17 DIAGNOSIS — H35373 Puckering of macula, bilateral: Secondary | ICD-10-CM | POA: Insufficient documentation

## 2011-05-17 DIAGNOSIS — Z961 Presence of intraocular lens: Secondary | ICD-10-CM | POA: Insufficient documentation

## 2011-05-17 DIAGNOSIS — H11829 Conjunctivochalasis, unspecified eye: Secondary | ICD-10-CM | POA: Insufficient documentation

## 2011-05-23 ENCOUNTER — Encounter: Payer: Self-pay | Admitting: Pulmonary Disease

## 2011-05-23 ENCOUNTER — Ambulatory Visit (INDEPENDENT_AMBULATORY_CARE_PROVIDER_SITE_OTHER): Payer: Medicare Other | Admitting: Pulmonary Disease

## 2011-05-23 DIAGNOSIS — Z86010 Personal history of colonic polyps: Secondary | ICD-10-CM

## 2011-05-23 DIAGNOSIS — I059 Rheumatic mitral valve disease, unspecified: Secondary | ICD-10-CM

## 2011-05-23 DIAGNOSIS — F411 Generalized anxiety disorder: Secondary | ICD-10-CM

## 2011-05-23 DIAGNOSIS — M1990 Unspecified osteoarthritis, unspecified site: Secondary | ICD-10-CM

## 2011-05-23 DIAGNOSIS — K219 Gastro-esophageal reflux disease without esophagitis: Secondary | ICD-10-CM

## 2011-05-23 DIAGNOSIS — K589 Irritable bowel syndrome without diarrhea: Secondary | ICD-10-CM

## 2011-05-23 DIAGNOSIS — J40 Bronchitis, not specified as acute or chronic: Secondary | ICD-10-CM

## 2011-05-23 DIAGNOSIS — E7800 Pure hypercholesterolemia, unspecified: Secondary | ICD-10-CM

## 2011-05-23 DIAGNOSIS — Z23 Encounter for immunization: Secondary | ICD-10-CM

## 2011-05-23 DIAGNOSIS — M899 Disorder of bone, unspecified: Secondary | ICD-10-CM

## 2011-05-23 MED ORDER — LEVOFLOXACIN 500 MG PO TABS: 500.0000 mg | ORAL_TABLET | Freq: Every day | ORAL | Status: AC

## 2011-05-23 MED ORDER — METHYLPREDNISOLONE ACETATE 80 MG/ML IJ SUSP
80.0000 mg | Freq: Once | INTRAMUSCULAR | Status: AC
  Administered 2011-05-23: 80 mg via INTRA_ARTICULAR

## 2011-05-23 MED ORDER — RANITIDINE HCL 300 MG PO TABS: 300.0000 mg | ORAL_TABLET | Freq: Every day | ORAL | Status: DC

## 2011-05-23 NOTE — Patient Instructions (Signed)
Today we updated your med list in our EPIC system...    Continue your current medications the same...    Remember to take the Nexium 30 min before a meal for best results...    OK to restart Zantac at bedtime if needed for nocturnal symptoms...  For your BRONCHITIS:  We gave you a Depo shot for the inflammation...    Use the MUCINEX 600mg  tabs- 2tabs twice daily w/ lots of fluids...    Take the [facility] antibiotic one tab daily til gone....    You may use Robitussin vs Delsym cough syrup as needed...  Stay as active as possible...  Call for any questions...  Let's plan our physical in 6 months w/ CXR & FASTING blood work at that time.Marland KitchenMarland Kitchen

## 2011-06-04 ENCOUNTER — Encounter: Payer: Self-pay | Admitting: Pulmonary Disease

## 2011-06-04 NOTE — Progress Notes (Signed)
Subjective:    Patient ID: Laura [name], female    DOB: Feb 08, 1941, 70 y.o.   MRN: [id number]  HPI  70 y/o WF here for a follow up visit... she has prob Sarcoidosis w/ hx Uveitis and BHA on CXR... we are following a "watchful waiting" protocol...she continues to feel well- no cough, sputum, CP, or SOB...   ~  November 16, 2009:  she has noted a recent URI w/ "crud", some discomfort when swallowing, swollen glands, fatigue, etc... denies f/c/s, not much cough/ phlegm, no CP etc... feels sl better w/ Tylenol Rx over weekend but we decided to Rx w/ Depo & MMW...  otherw feeling well> no chest symptoms- ie cough, sputum, dyspnea, etc... due for f/u CXR for her presumed Sarcoid, check ACE, etc...  Chol looks good on Simva40;  reflux controlled on Nexium/ Zantac;  she is due for a 11yr f/u BMD...  ~  May 17, 2010:  she reports incr stress w/ sister Louise's situation- offered Rebeca Allegra Rx but she declines... she had BMD 5/11- min osteopenia & stable on Calcium, MVI, Vit D... no new complaints or concerns- breathing stable;  Chol controlled on diet + Simva20;  GI is stable & up to date;  she had the 2011 Flu shot, due for PNEUMOVAX, & discussed Shingles vaccine as well.  ~  November 17, 2010:  70mo ROV & presents w/ 5d hx of URI- frontal HA, aching, sore all over, sore throat, sinus draining yellow, swollen glands, & low grade temp 100; denies cough/ sputum/ CP; we decided to check CXR- stable RML scarring, prom hila c/w adenopathy, NAD; & Labs- all essent wnl; Rx w/ Depo, Avelox, Mucinex, MMW...    Hx Sarcoid- stable via CXR;  Denies CP/ palpit, SOB, edema;  Lipids controlled on Simva40;  GI stable on Nexium & Zantac as needed;  DJD/ LBP/ Osteopenia- stable...  ~  May 23, 2011:  70mo ROV & she is c/o cough, greenish/yellow sputum, nasal congestion w/ drainage, etc- all for several weeks & not responsive to her OTC Rx;  We will treat her acute bronchitis w/ Depo, Levaquin, Mucinex, Fluids, etc (she is intol to  Augmentin)...  She had GI eval by DrPatterson 5/12> chr GERD, hx 3cmHH, prev stricture dilated, hx colon polyps, etc> c/o incr reflux symptoms, bloating, pressure despite her Nexium40AM & Zantac300PM;  Nexium was increased to Bid, Carafate added Qid & she had EGD 5/12 showing mod gastritis w/ bx= chr inflamm, neg HPylori;  She also had colonoscopy at the same time showing mod divertics, sessile polyp in cecum= serrated adenoma w/ f/u planned 3-48yrs... See prob list below:          Problem List:  Hx of UVEITIS (ICD-364.3) - eval at [facility] since 2001, given steroid injection in 2002... s/p right cataract surg 2/09 and no active uveitis seen... ~  4/11:  she tells me she was seen 10/10 & doing well, may need left cataract surg soon.  R/O SARCOIDOSIS (ICD-135) - Hx uveitis w/ eval at [facility], and subseq f/u CXR w/ CTChest showing bilat hilar adenopathy, and w/u showing neg PPD, ACE=53, sed=13... prev PFT's 1/08 showed FVC 3.75 (111%), FEV1 2.74 (102%), %1sec=73, mid-flows=70%... being followed w/ serial films and "watchful waiting"... there is a family hx of sarcoidosis. ~   we had a f/u CXR in Feb09- streaky opacity right mid lung zone, similar to 2008...  ~  CT Chest 7/08 & 6/09 showed mediastinal > hilar adenopathy some w/ calcif (generally  smaller than 1/08), area of atx/ scarring right mid zone (infer RUL) & scat <1cm nodules in lower zones w/o change... ~  CXR 2/10 showed bilat hilar prominence & scarring appears the same- NAD... labs all WNL w/ ACE=56. ~  CXR 4/11 showed mild bilat hilar prominence, scarring, NAD- osteopenia & DJD in TSpine... ACE= 37. ~  CXR 4/12 showed stable bilat hilar prominence, scarring, NAD...  MITRAL VALVE PROLAPSE (ICD-424.0) - on ASA 81mg /d... clinical dx w/ 2DEcho 1999 showing flat closure but no frank prolapse...  HYPERCHOLESTEROLEMIA (ICD-272.0) - on SIMVASTATIN 40mg /d now... ~  FLP 1/08 on Lip10 showed TChol 183, TG 122, HDL 45, LDL 114...  ~  FLP 2/09 on Lip10  showed TChol 183, TG 189, HDL 39, LDL 107... continue diet & change to Simva40.  ~  FLP 5/09 on Simva40 showed TChol 185, TG 163, HDL 36, LDL 117... she wants to stay on Simva40. ~  FLP 2/10 showed TChol 176, Tg 160, HDL 39, LDL 105... rec> continue same. ~  FLP 4/11 showed TChol 160, TG 131, HDL 41, LDL 93 ~  FLP 4/12 showed TChol 174, TG 148, HDL 43, LDL 102  GERD (ICD-530.81) - on NEXIUM 40mg /d & ZANTAC 300mg Qhs... ~  EGD 6/07 w/ 3cmHH, stricture, gastitis (HPylori neg)... I offered to change her Nexium to a generic but she declined- "DrPatterson told me never to change this med"... she notes occas nocturnal reflux symptoms. ~  5/12: She had GI eval by DrPatterson 5/12> chr GERD, hx 3cmHH, prev stricture dilated, hx colon polyps, etc> c/o incr reflux symptoms, bloating, pressure despite her Nexium40AM & Zantac300PM;  Nexium was increased to Bid, Carafate added Qid & she had EGD 5/12 showing mod gastritis w/ bx= chr inflamm, neg HPylori...  IRRITABLE BOWEL SYNDROME (ICD-564.1) COLONIC POLYPS, HX OF (ICD-V12.72)  ~  Colonoscopy 6/07 by DrPatterson showing divertics & polyps (adenomatous)... f/u planned 80yrs... ~  She had f/u colonoscopy 5/12 showing mod divertics, sessile polyp in cecum= serrated adenoma w/ f/u planned 3-16yrs...  UTI'S, CHRONIC (ICD-599.0) - eval by DrOttelin for urology... now off the Macrodantin and using cranberry juice without recurrent UTI.  DEGENERATIVE JOINT DISEASE (ICD-715.90) - eval by DrDalldorf w/ mod DJD in knees & hx of torn left meniscus... 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 overall improved now.  LOW BACK PAIN, CHRONIC (ICD-724.2)  OSTEOPENIA (ICD-733.90) - on Calcium, MVI, Vit D... ~  BMD in 2005 showed TScore -1.2 in Spine, & -0.7 in [facility]. ~  BMD 5/09 showed TScores -1.5 in Spine & -1.1 in [facility]. ~  BMD sched 5/11 showed TScores -1.2 in Spine, and -1.2  in [facility].  ANXIETY (ICD-300.00)  Hx of BASAL CELL SKIN CANCER - she still sees her Derm in [place] yearly...  Health Maintenance: ~  GI:  followed by DrPatterson w/ colon as above... ~  GYN:  followed by DrRichardson & doing well by pt's report... [facility]... ~  Immunizations:  she gets yearly flu shots;  has PNEUMOVAX in 1998 & repeated 10/11 (age 50);  ?last Tetanus;  we discussed Shingles vaccine (she had bout of shingles involving her right hip & leg ~age30) & she will decide.    Past Surgical History  Procedure Date  . Vesicovaginal fistula closure w/ tah   . Basal cell carcinoma excision   . Cataract extraction     right  . Removal of  mass from rt antecubital fossa   . Abdominal hysterectomy     Outpatient Encounter Prescriptions as of 05/23/2011  Medication Sig Dispense Refill  . aspirin 81 MG tablet Take 81 mg by mouth daily.        . B Complex Vitamins (VITAMIN-B COMPLEX PO) Take by mouth daily.        . Calcium Carbonate (CALTRATE 600 PO) Take 1 tablet by mouth 2 (two) times daily.        . Cholecalciferol (VITAMIN D) 1000 UNITS capsule Take 1,000 Units by mouth daily.        [name] esomeprazole (NEXIUM) 40 MG capsule Take 1 capsule (40 mg total) by mouth daily before breakfast.  30 capsule  11  . Multiple Vitamins-Minerals (MULTIVITAL) tablet Take 1 tablet by mouth daily.        . Omega-3 Fatty Acids (FISH OIL) 1000 MG CAPS Take 1 capsule by mouth daily.        . simvastatin (ZOCOR) 40 MG tablet Take 1 tablet (40 mg total) by mouth at bedtime.  90 tablet  3  . Diphenhyd-Hydrocort-Nystatin (FIRST-DUKES MOUTHWASH) SUSP Use 1 teaspoon gargle and swallow four times daily as needed for sore throat  120 mL  1  . levofloxacin (LEVAQUIN) 500 MG tablet Take 1 tablet (500 mg total) by mouth daily.  7 tablet  0  . ranitidine (ZANTAC) 300 MG tablet Take 1 tablet (300 mg total) by mouth at bedtime.  90 tablet  3  . DISCONTD: ranitidine (ZANTAC) 300 MG tablet Take  300 mg by mouth at bedtime.        [name] DISCONTD: sucralfate (CARAFATE) 1 GM/10ML suspension Take 1 gram between meals and before bedtime.  420 mL  1   Facility-Administered Encounter Medications as of 05/23/2011  Medication Dose Route Frequency Provider Last Rate Last Dose  . 0.9 %  sodium chloride infusion  500 mL Intravenous Continuous Sheryn Bison, MD      . methylPREDNISolone acetate (DEPO-MEDROL) injection 80 mg  80 mg Intra-articular Once Michele Mcalpine, MD   80 mg at 05/23/11 8295    Allergies  Allergen Reactions  . Codeine     REACTION: nausea  . AOZ:HYQMVHQIONG+EXBMWUXLK+GMWNUUVOZD Acid+Aspartame     REACTION: causes diarrhea  . Prednisone     REACTION: unable to take this med due to an eye condition    Review of Systems         See HPI - all other systems neg except as noted... The patient denies anorexia, fever, weight loss, weight gain, vision loss, decreased hearing, hoarseness, chest pain, syncope, dyspnea on exertion, peripheral edema, prolonged cough, headaches, hemoptysis, abdominal pain, melena, hematochezia, severe indigestion/heartburn, hematuria, incontinence, muscle weakness, suspicious skin lesions, transient blindness, difficulty walking, depression, unusual weight change, abnormal bleeding, enlarged lymph nodes, and angioedema.     Objective:   Physical Exam    WD, WN, 70 y/o WF in NAD... GENERAL:  Alert & oriented; pleasant & cooperative... HEENT:  [place]/AT, EOM-wnl, PERRLA, EACs-clear, TMs-wnl, NOSE-clear, THROAT-clear & wnl. NECK:  Supple w/ fairROM; no JVD; normal carotid impulses w/o bruits; no thyromegaly or nodules palpated; no lymphadenopathy. CHEST:  Clear to P & A; without wheezes/ rales/ or rhonchi. HEART:  Regular Rhythm; without murmurs/ rubs/ or gallops. ABDOMEN:  Soft & nontender; normal bowel sounds; no organomegaly or masses detected. EXT: without deformities, mild arthritic changes; no varicose veins/ +venous insuffic/ no edema. NEURO:  CN's  intact;  no focal neuro deficits... DERM:  No lesions noted; no rash etc...   Assessment & Plan:   Acute Bronchitis>  We discussed treatment w/ Depo80, Levaquin, Mucinex, Fluids, etc...  SARCOID>  CXR 4/12 stable & she remains asymptomatic...  CHOL>  Stable on the Simva40 + diet...  GERD>  On Nexium & Zantac, she is currently improved from her 5/12 symptoms...  DJD/ LBP/ Osteopenia>  As noted she remains stable on current meds & exercise program...  Other medical issues as noted.Marland KitchenMarland Kitchen

## 2011-06-07 ENCOUNTER — Telehealth: Payer: Self-pay | Admitting: Pulmonary Disease

## 2011-06-07 NOTE — Telephone Encounter (Signed)
Per SN--if she is not able to take the medrol or the prednisone---then take the mucinex 2 po bid , increase fluids, tylenol, rest.  Pt stated that she has been told by the eye doctor in [place] to never take the prednisone---she is able to take injections from the waist down of cortisone and she stated that she has used the eye drops before as needed, but she is not sure why they will not let her take the prednisone.  Pt stated that she did not feel comfortable taking anything like that without talking to the eye doctor first and she stated that she would not be able to talk to anyone from there until next week due to them being closed on fridays.  Pt stated that she will just cont. With the mucinex and see what happens.

## 2011-06-07 NOTE — Telephone Encounter (Signed)
Per SN---pred dosepak  5mg   6 day pack to take as directed, add mucinex 600mg   2 po bid with plenty of fluids.  thanks

## 2011-06-07 NOTE — Telephone Encounter (Signed)
Called and spoke with pt.  Pt states she was seen on 10/31 by SN for bronchitis and was given depo 80mg  injection and levaquin which pt finished last Tuesday.  Pt states she is feeling better overall  But does c/o still coughing up green to yellow sputum, ears plugged, "rattling in throat"  Green to yellow nasal drainage, fatigue, and body aches.  Denies f/c/s, wheezing, tightness in chest, or sob.  Pt is wanting to know if she needs another round of abx or not.  Please advise.  Thanks. Allergies  Allergen Reactions  . Codeine     REACTION: nausea  . UEA:VWUJWJXBJYN+WGNFAOZHY+QMVHQIONGE Acid+Aspartame     REACTION: causes diarrhea  . Prednisone     REACTION: unable to take this med due to an eye condition

## 2011-06-07 NOTE — Telephone Encounter (Signed)
I spoke with pt and she states she can't take prednisone. Pt states she will only take this if she really needs to. Please advise Dr. Kriste Basque, thanks  Allergies  Allergen Reactions  . Codeine     REACTION: nausea  . RUE:AVWUJWJXBJY+NWGNFAOZH+YQMVHQIONG Acid+Aspartame     REACTION: causes diarrhea  . Prednisone     REACTION: unable to take this med due to an eye condition    Carver Fila, CMA

## 2011-10-01 ENCOUNTER — Ambulatory Visit (AMBULATORY_SURGERY_CENTER): Payer: Medicare Other | Admitting: Gastroenterology

## 2011-10-01 ENCOUNTER — Other Ambulatory Visit: Payer: Self-pay | Admitting: Gastroenterology

## 2011-10-01 ENCOUNTER — Telehealth: Payer: Self-pay | Admitting: Gastroenterology

## 2011-10-01 ENCOUNTER — Encounter: Payer: Self-pay | Admitting: Gastroenterology

## 2011-10-01 DIAGNOSIS — R1310 Dysphagia, unspecified: Secondary | ICD-10-CM

## 2011-10-01 MED ORDER — ESOMEPRAZOLE MAGNESIUM 40 MG PO CPDR: 40.0000 mg | DELAYED_RELEASE_CAPSULE | Freq: Two times a day (BID) | ORAL | Status: DC

## 2011-10-01 MED ORDER — SUCRALFATE 1 GM/10ML PO SUSP: ORAL | Status: DC

## 2011-10-01 MED ORDER — TRAMADOL HCL 50 MG PO TABS: ORAL_TABLET | ORAL | Status: DC

## 2011-10-01 MED ORDER — SODIUM CHLORIDE 0.9 % IV SOLN: 500.0000 mL | INTRAVENOUS | Status: DC

## 2011-10-01 MED ORDER — LIDOCAINE VISCOUS 2 % MT SOLN: OROMUCOSAL | Status: DC

## 2011-10-01 NOTE — Telephone Encounter (Signed)
Addended by: Harlow Mares D on: 10/01/2011 11:24 AM   Modules accepted: Orders

## 2011-10-01 NOTE — Telephone Encounter (Signed)
Pt with hx of Acute Esophagitis, GERD, RUQ Pain, Hx of Adenomatous Polyps, Family Hx of Colon CA, Hx of Esophageal Stricture. Pt with current ECL of 12/03/10 with Serrated Polyps; EGD with Prominent Parietal Cell Pseudohypertrophy and - for H. Pylori. Pt reports major issues with swallowing that began last Wednesday and gradually has gotten worse since then. She states when she swallows anything, liqiud or solid, she has pain from her upper esophagus to her stomach. She feels as though there's a "knot" in her esophagus and when the swallowed material gets to it, that's when she has excruciating pain. She has been trying to drink liquids or eat soft food, but even liquids are difficult. Pt reports she was recently on a ZPack and is taking Mucinex for a sinus and ear infection, but she doesn't know if that's related. Pt takes Nexium in the am and Zantac at night. She has been trying to dissolve carafate tabs and isn't sure if that helps. Please advise; I can schedule her with you or Amy tomorrow. Thanks.

## 2011-10-01 NOTE — Progress Notes (Signed)
rxs sent to pharmacy, and soft diet printed and sent to [facility].

## 2011-10-01 NOTE — Progress Notes (Signed)
PROPOFOL PER S CAMP CRNA. SEE SCANNED INTRA PROCEDURE REPORT. EWM  PT TOLERATED PROCEDURE WELL WITH NO DISTRESS OR PROBLEMS. EWM

## 2011-10-01 NOTE — Patient Instructions (Signed)
Soft Diet The soft diet may be recommended after you were put on a full liquid diet. A normal diet may follow. The soft diet can also be used after surgery if you are too ill to keep down a normal diet. The soft diet may also be needed if you have a hard time chewing foods. DESCRIPTION Tender foods are used. Foods do not need to be ground or pureed. Most raw fruits and vegetables and coarse breads and cereals should be avoided. Fried foods and highly seasoned foods may cause discomfort. NUTRITIONAL ADEQUACY A healthy diet is possible if foods from each of the basic food groups are eaten daily. SOFT DIET FOOD LISTS Milk/Dairy  Allowed: Milk and milk drinks, milk shakes, cream cheese, cottage cheese, mild cheeses.   Avoid: Sharp or highly seasoned cheese.  Meat/Meat Substitutes  Allowed: Broiled, roasted, baked, or stewed tender lean beef, mutton, lamb, veal, chicken, [place], liver, ham, crisp bacon, white fish, tuna, salmon. Eggs, smooth peanut butter.   Avoid: All fried meats, fish, or fowl. Rich gravies and sauces. Lunch meats, sausages, hot dogs. Meats with gristle, chunky peanut butter.  Breads/Grains  Allowed: Rice, noodles, spaghetti, macaroni. Dry or cooked refined cereals, such as farina, cream of wheat, oatmeal, grits, whole-wheat cereals. Plain or toasted white or wheat blend or whole-grain breads, soda crackers or saltines, flour tortillas.   Avoid: Wild rice, coarse cereals, such as bran. Seed in or on breads and crackers. Bread or bread products with nuts or seeds.  Fruits/Vegetables  Allowed: Fruit and vegetable juices, well-cooked or canned fruits and vegetables, any dried fruit. One citrus fruit daily, 1 vitamin A source daily. Well-ripened, easy to chew fruits, sweet potatoes. Baked, boiled, mashed, creamed, scalloped, or au gratin potatoes. Broths or creamed soups made with allowed vegetables, strained tomatoes.   Avoid: All gas-forming vegetables (corn, radishes, Brussels  sprouts, onions, broccoli, cabbage, parsnips, turnips, chili peppers, pinto beans, split peas, dried beans). Fruits containing seeds and skin. Potato chips and corn chips. All others that are not made with allowed vegetables. Highly seasoned soups.  Desserts/Sweets  Allowed: Simple desserts, such as custard, junkets, gelatin desserts, plain ice cream and sherbets, simple cakes and cookies, allowed fruits, sugar, syrup, jelly, honey, plain hard candy, and molasses.   Avoid: Rich pastries, any dessert containing dates, nuts, raisins, or coconut. Fried pastries, such as doughnuts. Chocolate.  Beverages  Allowed: Fruit and vegetable juices. Caffeine-free carbonated drinks, coffee, and tea.   Avoid: Caffeinated beverages: coffee, tea, soda or pop.  Miscellaneous  Allowed: Butter, cream, margarine, mayonnaise, oil. Cream sauces, salt, and mild spices.   Avoid: Highly spiced salad dressings. Highly seasoned foods, hot sauce, mustard, horseradish, and pepper.  SAMPLE MENU Breakfast  Orange juice.   Oatmeal.   Soft cooked egg.   Toast and margarine.   2% milk.   Coffee.  Lunch  Meatloaf.   Mashed potato.   Green beans.   Lemon pudding.   Bread and margarine.   Coffee.  Dinner  Consomm or apricot nectar.   Chicken breast.   Rice, peas, and carrots.   Applesauce.   Bread and margarine.   2% milk.  To cut the amount of fat in your diet, omit margarine and use 1% or skim milk. NUTRIENT ANALYSIS  Calories........................1953 Kcal.   Protein.........................102 gm.   Carbohydrate...............247 gm.   Fat................................65 gm.   Cholesterol...................449 mg.   Dietary fiber.................19 gm.   Vitamin A.....................2944 RE.   Vitamin C.....................79 mg.   Niacin..........................25 mg.  Riboflavin....................2.0 mg.   Thiamin.......................1.5 mg.    Folate..........................249 mcg.   Calcium.......................1030 mg.   Phosphorus.................1782 mg.   Zinc..............................12 mg.   Iron..............................13 mg.   Sodium.........................299 mg.   Potassium....................3046 mg.  Document Released: 10/16/2007 Document Revised: 06/28/2011 Document Reviewed: 10/16/2007 [facility] Patient Information 2012 [place], [place].

## 2011-10-01 NOTE — Progress Notes (Signed)
No complaints noted in the recovery room. Maw     Patient did not experience any of the following events: a burn prior to discharge; a fall within the facility; wrong site/side/patient/procedure/implant event; or a hospital transfer or hospital admission upon discharge from the facility. [phone])   Patient did not have preoperative order for IV antibiotic SSI prophylaxis. [phone]   Dr. Jarold Motto the pt i to increase her nexium 40 mg to bid until her next office vistt with him 10-12-11 at 11:15.  The pt did not have enough nexium to double up on until 10-12-11 appointment.  I called [facility] on the 3rd floor and they were out of nexium 40 mg samples.  I sent a rx to [facility] for Nexium 40 mg #12 1 bid no refill.  If the pt had difficulities with her insurance company paying for this rx, Titus Dubin said for the pt to call back to see if samples have come in.  Pt and her daughter understands this.  maw

## 2011-10-01 NOTE — Telephone Encounter (Signed)
Per Dr Jarold Motto Patient can come in this afternoon for EGD. Pt will arrive at 1:30pm advised pt she is to be on clear liquids until 12:30pm and then NPO and she will have to have a drive come and stay the whole time with her. She verbalized understanding.

## 2011-10-01 NOTE — Op Note (Signed)
[facility] [address] [place], [place]  [address]  ENDOSCOPY PROCEDURE REPORT  PATIENT:  Laura [name], Laura [name]  MR#:  [id number] BIRTHDATE:  [birth year], 71 yrs. old  GENDER:  female  ENDOSCOPIST:  Vania Rea. Jarold Motto, MD, [facility] Referred by:  PROCEDURE DATE:  10/01/2011 PROCEDURE:  EGD, diagnostic 43235, [place] Dilation of Esophagus ASA CLASS:  Class II INDICATIONS:  ACUTE PAINFUL SWALLOWING AND DYSPHAGIA.S/P RECENT ANTIBIOTICS.HX. OF CHRONIC GERD AND PRIOR STRICTURE DILATED MARCH 2013.  MEDICATIONS:   propofol (Diprivan) 200 mg IV TOPICAL ANESTHETIC:  DESCRIPTION OF PROCEDURE:   After the risks and benefits of the procedure were explained, informed consent was obtained.  The [facility] GIF-[id number] [id number] endoscope was introduced through the mouth and advanced to the second portion of the duodenum.  The instrument was slowly withdrawn as the mucosa was fully examined. <<PROCEDUREIMAGES>>  A hiatal hernia was found. 3 CM. HH NOTED.DILATED #3F MALONEY DILATOR.SLIGHT HEME NOTED.  Normal duodenal folds were noted. other findings. LARGE RUGAL FOLDS IN FUNDUS NOTED.  Esophagitis was found. EROSIONS AND ERYTHEMA IN MID ESOPHAGUS.NO PLAQUES OR VIRAL-LIKE EROSIONS.    Retroflexed views revealed a hiatal hernia.    The scope was then withdrawn from the patient and the procedure completed.  COMPLICATIONS:  None  ENDOSCOPIC IMPRESSION: 1) Hiatal hernia 2) Normal duodenal folds 3) Other findings 4) Esophagitis 5) A hiatal hernia 1.PROBABLE "PILL ESOPHAGITIS".?? SOME EXTRINSIIC COMPRESSION IN MID-ESOPHAGUS AREA. 2.CHRONIC GERD AND ?? STRICTURE.DILATION DONE. 3.LARGE RUGAE NOTED. RECOMMENDATIONS: 1.CARAFATE SUSPENSION PC AND QHS 2.PRN TRAMADOL FOR PAIN 3.SOFT DIET 4.CONTINUE PPI 4.PRN VISCOUS XYLOCAINE  ______________________________ Vania Rea. Jarold Motto, MD, Clementeen Graham  CC:  n. eSIGNED:   Vania Rea. Ashantae Pangallo at 10/01/2011 02:47 PM  [place], [place], [id number]

## 2011-10-01 NOTE — Patient Instructions (Signed)
New rx sent to [facility] on [address].  Also Increase the nexium from once daily to 2 x per day until Dr. Jarold Motto see you back in the office.  Follow up appointment with Dr. Jarold Motto  On the 3rd floor for Friday, March 22 at 11:15.  Pleasresume your prior medications today.  Call if any questions or concerns.  Handouts were given on GERD, hiatal hernia, esophagitis and dilatation diet.  YOU HAD AN ENDOSCOPIC PROCEDURE TODAY AT THE [facility]: Refer to the procedure report that was given to you for any specific questions about what was found during the examination.  If the procedure report does not answer your questions, please call your gastroenterologist to clarify.  If you requested that your care partner not be given the details of your procedure findings, then the procedure report has been included in a sealed envelope for you to review at your convenience later.  YOU SHOULD EXPECT: Some feelings of bloating in the abdomen. Passage of more gas than usual.  Walking can help get rid of the air that was put into your GI tract during the procedure and reduce the bloating. If you had a lower endoscopy (such as a colonoscopy or flexible sigmoidoscopy) you may notice spotting of blood in your stool or on the toilet paper. If you underwent a bowel prep for your procedure, then you may not have a normal bowel movement for a few days.  DIET: Y   Drink plenty of fluids but you should avoid alcoholic beverages for 24 hours.  Please follow the dilatation diet for the rest of the day a handout was given to your care partner.  ACTIVITY: Your care partner should take you home directly after the procedure.  You should plan to take it easy, moving slowly for the rest of the day.  You can resume normal activity the day after the procedure however you should NOT DRIVE or use heavy machinery for 24 hours (because of the sedation medicines used during the test).    SYMPTOMS TO REPORT IMMEDIATELY: A  gastroenterologist can be reached at any hour.  During normal business hours, 8:30 AM to 5:00 PM Monday through Friday, call [phone].  After hours and on weekends, please call the GI answering service at [phone] who will take a message and have the physician on call contact you.     Following upper endoscopy (EGD)  Vomiting of blood or coffee ground material  New chest pain or pain under the shoulder blades  Painful or persistently difficult swallowing  New shortness of breath  Fever of 100F or higher  Black, tarry-looking stools  FOLLOW UP: If any biopsies were taken you will be contacted by phone or by letter within the next 1-3 weeks.  Call your gastroenterologist if you have not heard about the biopsies in 3 weeks.  Our staff will call the home number listed on your records the next business day following your procedure to check on you and address any questions or concerns that you may have at that time regarding the information given to you following your procedure. This is a courtesy call and so if there is no answer at the home number and we have not heard from you through the emergency physician on call, we will assume that you have returned to your regular daily activities without incident.  SIGNATURES/CONFIDENTIALITY: You and/or your care partner have signed paperwork which will be entered into your electronic medical record.  These signatures attest  to the fact that that the information above on your After Visit Summary has been reviewed and is understood.  Full responsibility of the confidentiality of this discharge information lies with you and/or your care-partner.

## 2011-10-02 ENCOUNTER — Telehealth: Payer: Self-pay | Admitting: *Deleted

## 2011-10-02 NOTE — Telephone Encounter (Signed)
  Follow up Call-  Call back number 10/01/2011 12/20/2010  Post procedure Call Back phone  # [phone]  Permission to leave phone message Yes -     Patient questions:  Do you have a fever, pain , or abdominal swelling? no Pain Score  0 *  Have you tolerated food without any problems? yes  Have you been able to return to your normal activities? yes  Do you have any questions about your discharge instructions: Diet   no Medications  no Follow up visit  no  Do you have questions or concerns about your Care? no  Actions: * If pain score is 4 or above: No action needed, pain <4.

## 2011-10-02 NOTE — Telephone Encounter (Signed)
PLEASE CALL CHELSEA IN [facility]. STATES THAT THERE IS STILL A DISCREPANCY WITH PRESCRIPTION BEING FILLED.

## 2011-10-04 ENCOUNTER — Other Ambulatory Visit: Payer: Self-pay | Admitting: *Deleted

## 2011-10-12 ENCOUNTER — Encounter: Payer: Self-pay | Admitting: Gastroenterology

## 2011-10-12 ENCOUNTER — Ambulatory Visit (INDEPENDENT_AMBULATORY_CARE_PROVIDER_SITE_OTHER): Payer: Medicare Other | Admitting: Gastroenterology

## 2011-10-12 VITALS — BP 134/72 | HR 80 | Ht 69.0 in | Wt 173.0 lb

## 2011-10-12 DIAGNOSIS — K208 Other esophagitis without bleeding: Secondary | ICD-10-CM

## 2011-10-12 DIAGNOSIS — T50901A Poisoning by unspecified drugs, medicaments and biological substances, accidental (unintentional), initial encounter: Secondary | ICD-10-CM

## 2011-10-12 DIAGNOSIS — T364X5A Adverse effect of tetracyclines, initial encounter: Secondary | ICD-10-CM | POA: Insufficient documentation

## 2011-10-12 DIAGNOSIS — T50904A Poisoning by unspecified drugs, medicaments and biological substances, undetermined, initial encounter: Secondary | ICD-10-CM

## 2011-10-12 DIAGNOSIS — K222 Esophageal obstruction: Secondary | ICD-10-CM | POA: Insufficient documentation

## 2011-10-12 DIAGNOSIS — K219 Gastro-esophageal reflux disease without esophagitis: Secondary | ICD-10-CM

## 2011-10-12 MED ORDER — SUCRALFATE 1 G PO TABS: 1.0000 g | ORAL_TABLET | Freq: Four times a day (QID) | ORAL | Status: DC

## 2011-10-12 NOTE — Patient Instructions (Signed)
We have sent your prescription to your pharmacy

## 2011-10-12 NOTE — Progress Notes (Signed)
History of Present Illness: This is a 71 year old Caucasian female recently endoscoped acutely because of dysphagia and painful swallowing. There was an area of severe ectasia in the mid esophagus are consistent with pill esophagitis. She underwent them. Dilation and treatment with liquid Carafate, Viscous Xylocaine, double dose PPI, and analgesics. She currently is asymptomatic and denies any acid reflux symptoms or swallowing difficulties. Again her chart shows multiple previous endoscopies with a long history of acid reflux and peptic strictures.    Current Medications, Allergies, Past Medical History, Past Surgical History, Family History and Social History were reviewed in [employer] record.   Assessment and plan: Probable pill esophagitis with an associated element of chronic GERD. I've asked her to take all of her pills standing upright with 8 ounces of fluid. I have decreased her Nexium to 40 mg once a day as tolerated. Then an anti-reflux when he was again reviewed with the patient and her daughter I will see her on a when necessary basis as needed. No diagnosis found.

## 2011-11-21 ENCOUNTER — Encounter: Payer: Self-pay | Admitting: Pulmonary Disease

## 2011-11-21 ENCOUNTER — Ambulatory Visit (INDEPENDENT_AMBULATORY_CARE_PROVIDER_SITE_OTHER): Payer: Medicare Other | Admitting: Pulmonary Disease

## 2011-11-21 ENCOUNTER — Telehealth: Payer: Self-pay | Admitting: Gastroenterology

## 2011-11-21 ENCOUNTER — Other Ambulatory Visit (INDEPENDENT_AMBULATORY_CARE_PROVIDER_SITE_OTHER): Payer: Medicare Other

## 2011-11-21 ENCOUNTER — Other Ambulatory Visit: Payer: Self-pay | Admitting: Gastroenterology

## 2011-11-21 VITALS — BP 142/90 | HR 69 | Temp 97.1°F | Ht 69.0 in | Wt 173.4 lb

## 2011-11-21 DIAGNOSIS — K589 Irritable bowel syndrome without diarrhea: Secondary | ICD-10-CM

## 2011-11-21 DIAGNOSIS — Z86010 Personal history of colonic polyps: Secondary | ICD-10-CM

## 2011-11-21 DIAGNOSIS — F411 Generalized anxiety disorder: Secondary | ICD-10-CM

## 2011-11-21 DIAGNOSIS — I059 Rheumatic mitral valve disease, unspecified: Secondary | ICD-10-CM

## 2011-11-21 DIAGNOSIS — D869 Sarcoidosis, unspecified: Secondary | ICD-10-CM

## 2011-11-21 DIAGNOSIS — M899 Disorder of bone, unspecified: Secondary | ICD-10-CM

## 2011-11-21 DIAGNOSIS — M949 Disorder of cartilage, unspecified: Secondary | ICD-10-CM

## 2011-11-21 DIAGNOSIS — M1990 Unspecified osteoarthritis, unspecified site: Secondary | ICD-10-CM

## 2011-11-21 DIAGNOSIS — E7800 Pure hypercholesterolemia, unspecified: Secondary | ICD-10-CM

## 2011-11-21 DIAGNOSIS — Z23 Encounter for immunization: Secondary | ICD-10-CM

## 2011-11-21 DIAGNOSIS — K219 Gastro-esophageal reflux disease without esophagitis: Secondary | ICD-10-CM

## 2011-11-21 LAB — LIPID PANEL
HDL: 46.8 mg/dL (ref >39.00)
Total CHOL/HDL Ratio: 3
VLDL: 30.2 mg/dL (ref 0.0–40.0)

## 2011-11-21 LAB — HEPATIC FUNCTION PANEL
ALT: 19 U/L (ref 0–35)
Total Bilirubin: 1 mg/dL (ref 0.3–1.2)

## 2011-11-21 LAB — CBC WITH DIFFERENTIAL/PLATELET
Basophils Relative: 0.5 % (ref 0.0–3.0)
Eosinophils Relative: 3 % (ref 0.0–5.0)
HCT: 44.1 % (ref 36.0–46.0)
MCV: 90.6 fl (ref 78.0–100.0)
Monocytes Absolute: 0.4 10*3/uL (ref 0.1–1.0)
Neutrophils Relative %: 59.5 % (ref 43.0–77.0)
RBC: 4.86 Mil/uL (ref 3.87–5.11)
WBC: 5.8 10*3/uL (ref 4.5–10.5)

## 2011-11-21 LAB — BASIC METABOLIC PANEL
BUN: 12 mg/dL (ref 6–23)
Creatinine, Ser: 0.7 mg/dL (ref 0.4–1.2)
GFR: 86.18 mL/min (ref >60.00)

## 2011-11-21 MED ORDER — SIMVASTATIN 40 MG PO TABS: 40.0000 mg | ORAL_TABLET | Freq: Every day | ORAL | Status: DC

## 2011-11-21 MED ORDER — ESOMEPRAZOLE MAGNESIUM 40 MG PO CPDR: 40.0000 mg | DELAYED_RELEASE_CAPSULE | Freq: Two times a day (BID) | ORAL | Status: DC

## 2011-11-21 NOTE — Progress Notes (Signed)
Subjective:    Patient ID: Laura [name], female    DOB: 09-Jun-1941, 70 y.o.   MRN: [id number]  HPI 71 y/o WF here for a follow up visit... she has prob Sarcoidosis w/ hx Uveitis and BHA on CXR... we are following a "watchful waiting" protocol...she continues to feel well- no cough, sputum, CP, or SOB...   ~  November 16, 2009:  she has noted a recent URI w/ "crud", some discomfort when swallowing, swollen glands, fatigue, etc... denies f/c/s, not much cough/ phlegm, no CP etc... feels sl better w/ Tylenol Rx over weekend but we decided to Rx w/ Depo & MMW...  otherw feeling well> no chest symptoms- ie cough, sputum, dyspnea, etc... due for f/u CXR for her presumed Sarcoid, check ACE, etc...  Chol looks good on Simva40;  reflux controlled on Nexium/ Zantac;  she is due for a 61yr f/u BMD...  ~  May 17, 2010:  she reports incr stress w/ sister Louise's situation- offered Rebeca Allegra Rx but she declines... she had BMD 5/11- min osteopenia & stable on Calcium, MVI, Vit D... no new complaints or concerns- breathing stable;  Chol controlled on diet + Simva20;  GI is stable & up to date;  she had the 2011 Flu shot, due for PNEUMOVAX, & discussed Shingles vaccine as well.  ~  November 17, 2010:  75mo ROV & presents w/ 5d hx of URI- frontal HA, aching, sore all over, sore throat, sinus draining yellow, swollen glands, & low grade temp 100; denies cough/ sputum/ CP; we decided to check CXR- stable RML scarring, prom hila c/w adenopathy, NAD; & Labs- all essent wnl; Rx w/ Depo, Avelox, Mucinex, MMW...    Hx Sarcoid- stable via CXR;  Denies CP/ palpit, SOB, edema;  Lipids controlled on Simva40;  GI stable on Nexium & Zantac as needed;  DJD/ LBP/ Osteopenia- stable...  ~  May 23, 2011:  75mo ROV & she is c/o cough, greenish/yellow sputum, nasal congestion w/ drainage, etc- all for several weeks & not responsive to her OTC Rx;  We will treat her acute bronchitis w/ Depo, Levaquin, Mucinex, Fluids, etc (she is intol to  Augmentin)...  She had GI eval by DrPatterson 5/12> chr GERD, hx 3cmHH, prev stricture dilated, hx colon polyps, etc> c/o incr reflux symptoms, bloating, pressure despite her Nexium40AM & Zantac300PM;  Nexium was increased to Bid, Carafate added Qid & she had EGD 5/12 showing mod gastritis w/ bx= chr inflamm, neg HPylori;  She also had colonoscopy at the same time showing mod divertics, sessile polyp in cecum= serrated adenoma w/ f/u planned 3-65yrs... See prob list below:   ~  Nov 21, 2011:  75mo ROV & Laura [name] is doing reasonably well w/o new complaints or concerns;     She was given Zithromax for an ear infection from an [facility] in [place]; 3/13 she developed an "irritation in my esoph" dyspagia/ odynophagia; EGD by DrPatterson revealed "Pill esophagitis" & she was treated w/ Carafate (off now), Nexium Bid, & Zantac 300Qhs;  improved now on & she will check w/ GI about a formulary PPI med...    We reviewed prob list, meds, xrays, & labs> see below>>  LABS 5/13:  FLP- at goals on Simva40;  Chems- wnl;  CBC- wnl;  TSH=1.61;  VitD=47         Problem List:  Hx of UVEITIS (ICD-364.3) - eval at [facility] since 2001, given steroid injection in 2002... s/p right cataract surg 2/09 and no active uveitis  seen... ~  4/11:  she tells me she was seen 10/10 & doing well, may need left cataract surg soon.  R/O SARCOIDOSIS (ICD-135) - Hx uveitis w/ eval at [facility], and subseq f/u CXR w/ CTChest showing bilat hilar adenopathy, and w/u showing neg PPD, ACE=53, sed=13... prev PFT's 1/08 showed FVC 3.75 (111%), FEV1 2.74 (102%), %1sec=73, mid-flows=70%... being followed w/ serial films and "watchful waiting"... there is a family hx of sarcoidosis. ~   we had a f/u CXR in Feb09- streaky opacity right mid lung zone, similar to 2008...  ~  CT Chest 7/08 & 6/09 showed mediastinal > hilar adenopathy some w/ calcif (generally smaller than 1/08), area of atx/ scarring right mid zone (infer RUL) & scat <1cm nodules in lower zones w/o  change... ~  CXR 2/10 showed bilat hilar prominence & scarring appears the same- NAD... labs all WNL w/ ACE=56. ~  CXR 4/11 showed mild bilat hilar prominence, scarring, NAD- osteopenia & DJD in TSpine... ACE= 37. ~  CXR 4/12 showed stable bilat hilar prominence, scarring, NAD...  MITRAL VALVE PROLAPSE (ICD-424.0) - on ASA 81mg /d... clinical dx w/ 2DEcho 1999 showing flat closure but no frank prolapse...  HYPERCHOLESTEROLEMIA (ICD-272.0) - on SIMVASTATIN 40mg /d now... ~  FLP 1/08 on Lip10 showed TChol 183, TG 122, HDL 45, LDL 114...  ~  FLP 2/09 on Lip10 showed TChol 183, TG 189, HDL 39, LDL 107... continue diet & change to Simva40.  ~  FLP 5/09 on Simva40 showed TChol 185, TG 163, HDL 36, LDL 117... she wants to stay on Simva40. ~  FLP 2/10 showed TChol 176, Tg 160, HDL 39, LDL 105... rec> continue same. ~  FLP 4/11 showed TChol 160, TG 131, HDL 41, LDL 93 ~  FLP 4/12 showed TChol 174, TG 148, HDL 43, LDL 102  GERD (ICD-530.81) - on NEXIUM 40mg /d & ZANTAC 300mg Qhs... ~  EGD 6/07 w/ 3cmHH, stricture, gastitis (HPylori neg)... I offered to change her Nexium to a generic but she declined- "DrPatterson told me never to change this med"... she notes occas nocturnal reflux symptoms. ~  5/12: She had GI eval by DrPatterson 5/12> chr GERD, hx 3cmHH, prev stricture dilated, hx colon polyps, etc> c/o incr reflux symptoms, bloating, pressure despite her Nexium40AM & Zantac300PM;  Nexium was increased to Bid, Carafate added Qid & she had EGD 5/12 showing mod gastritis w/ bx= chr inflamm, neg HPylori... ~  CTAbd 5/12 showed mult parenchymal nodules at lung bases (some fractionally larger), calcif hilar nodes bilat, no renal lesions (?left upper pole mass seen on sonar?). ~  EGD 3/13 by DrPatterson showed 3cmHH, esophagitis, prob "pill espohagitis", dilated... Symptoms resolved w/ Carafate, Nexium, Zantac, Xylocaine...  IRRITABLE BOWEL SYNDROME (ICD-564.1) COLONIC POLYPS, HX OF (ICD-V12.72)  ~  Colonoscopy  6/07 by DrPatterson showing divertics & polyps (adenomatous)... f/u planned 48yrs... ~  She had f/u colonoscopy 5/12 showing mod divertics, sessile polyp in cecum= serrated adenoma w/ f/u planned 3-52yrs...  UTI'S, CHRONIC (ICD-599.0) - eval by DrOttelin for urology... now off the Macrodantin and using cranberry juice without recurrent UTI, no problems...  DEGENERATIVE JOINT DISEASE (ICD-715.90) - eval by DrDalldorf w/ mod DJD in knees & hx of torn left meniscus... 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 overall improved now.  LOW BACK PAIN, CHRONIC (ICD-724.2)  OSTEOPENIA (ICD-733.90) - on Calcium, MVI, Vit D... ~  BMD in 2005 showed TScore -1.2 in Spine, & -  0.7 in [facility]. ~  BMD 5/09 showed TScores -1.5 in Spine & -1.1 in [facility]. ~  BMD sched 5/11 showed TScores -1.2 in Spine, and -1.2 in [facility]. ~  5/13:  She is due for f/u BMD but wants to wait for now...  ANXIETY (ICD-300.00)  Hx of BASAL CELL SKIN CANCER - she still sees her Derm in [place] yearly...  Health Maintenance: ~  GI:  followed by DrPatterson w/ colon as above... ~  GYN:  followed by DrRichardson & doing well by pt's report... [facility]... ~  Immunizations:  she gets yearly flu shots;  has PNEUMOVAX in 1998 & repeated 10/11 (age 54);  TDAP given 5/13;  we discussed Shingles vaccine (she had bout of shingles involving her right hip & leg ~age30) & she will decide.    Past Surgical History  Procedure Date  . Vesicovaginal fistula closure w/ tah   . Basal cell carcinoma excision   . Cataract extraction     right  . Removal of mass from rt antecubital fossa   . Abdominal hysterectomy   . Upper gastrointestinal endoscopy   . Colonoscopy     Outpatient Encounter Prescriptions as of 11/21/2011  Medication Sig Dispense Refill  . aspirin 81 MG tablet Take 81 mg by mouth daily.        . B Complex Vitamins  (VITAMIN-B COMPLEX PO) Take by mouth daily.        . Calcium Carbonate (CALTRATE 600 PO) Take 1 tablet by mouth 2 (two) times daily.        . Cholecalciferol (VITAMIN D) 1000 UNITS capsule Take 1,000 Units by mouth daily.        [name] esomeprazole (NEXIUM) 40 MG capsule Take 1 capsule (40 mg total) by mouth 2 (two) times daily.  60 capsule  0  . Multiple Vitamins-Minerals (MULTIVITAL) tablet Take 1 tablet by mouth daily.        . Omega-3 Fatty Acids (FISH OIL) 1000 MG CAPS Take 1 capsule by mouth daily.        . ranitidine (ZANTAC) 300 MG capsule Take 300 mg by mouth at bedtime.      . simvastatin (ZOCOR) 40 MG tablet Take 1 tablet (40 mg total) by mouth at bedtime.  90 tablet  3  . sucralfate (CARAFATE) 1 G tablet Take 1 tablet (1 g total) by mouth 4 (four) times daily.  120 tablet  1  . traMADol (ULTRAM) 50 MG tablet Take one tablet by mouth every 6-8 hours as needed  60 tablet  0    Allergies  Allergen Reactions  . Amoxicillin-Pot Clavulanate     REACTION: causes diarrhea  . Codeine     REACTION: nausea  . Prednisone     REACTION: unable to take this med due to an eye condition    Review of Systems         See HPI - all other systems neg except as noted... The patient denies anorexia, fever, weight loss, weight gain, vision loss, decreased hearing, hoarseness, chest pain, syncope, dyspnea on exertion, peripheral edema, prolonged cough, headaches, hemoptysis, abdominal pain, melena, hematochezia, severe indigestion/heartburn, hematuria, incontinence, muscle weakness, suspicious skin lesions, transient blindness, difficulty walking, depression, unusual weight change, abnormal bleeding, enlarged lymph nodes, and angioedema.     Objective:   Physical Exam    WD, WN, 71 y/o WF in NAD... GENERAL:  Alert & oriented; pleasant & cooperative... HEENT:  [place]/AT, EOM-wnl, PERRLA, EACs-clear, TMs-wnl, NOSE-clear,  THROAT-clear & wnl. NECK:  Supple w/ fairROM; no JVD; normal carotid impulses w/o  bruits; no thyromegaly or nodules palpated; no lymphadenopathy. CHEST:  Clear to P & A; without wheezes/ rales/ or rhonchi. HEART:  Regular Rhythm; without murmurs/ rubs/ or gallops. ABDOMEN:  Soft & nontender; normal bowel sounds; no organomegaly or masses detected. EXT: without deformities, mild arthritic changes; no varicose veins/ +venous insuffic/ no edema. NEURO:  CN's intact;  no focal neuro deficits... DERM:  No lesions noted; no rash etc...  RADIOLOGY DATA:  Reviewed in the EPIC EMR & discussed w/ the patient...  LABORATORY DATA:  Reviewed in the EPIC EMR & discussed w/ the patient...   Assessment & Plan:   Acute Bronchitis>  Doing satis w/o recent exacerbation...  SARCOID>  CXR 4/12 stable & she remains asymptomatic; note CTAbd findings at lung bases...  CHOL>  Stable on the Simva40 + diet...  GERD>  On Nexium & Zantac, she is currently improved from her 5/12 symptoms...  DJD/ LBP/ Osteopenia>  As noted she remains stable on current meds & exercise program...  Other medical issues as noted...   Patient's Medications  New Prescriptions   No medications on file  Previous Medications   ASPIRIN 81 MG TABLET    Take 81 mg by mouth daily.     B COMPLEX VITAMINS (VITAMIN-B COMPLEX PO)    Take by mouth daily.     CALCIUM CARBONATE (CALTRATE 600 PO)    Take 1 tablet by mouth 2 (two) times daily.     CHOLECALCIFEROL (VITAMIN D) 1000 UNITS CAPSULE    Take 1,000 Units by mouth daily.     MULTIPLE VITAMINS-MINERALS (MULTIVITAL) TABLET    Take 1 tablet by mouth daily.     OMEGA-3 FATTY ACIDS (FISH OIL) 1000 MG CAPS    Take 1 capsule by mouth daily.     RANITIDINE (ZANTAC) 300 MG CAPSULE    Take 300 mg by mouth at bedtime.   SUCRALFATE (CARAFATE) 1 G TABLET    Take 1 tablet (1 g total) by mouth 4 (four) times daily.   TRAMADOL (ULTRAM) 50 MG TABLET    Take one tablet by mouth every 6-8 hours as needed  Modified Medications   Modified Medication Previous Medication   ESOMEPRAZOLE  (NEXIUM) 40 MG CAPSULE esomeprazole (NEXIUM) 40 MG capsule      Take 1 capsule (40 mg total) by mouth 2 (two) times daily.    Take 1 capsule (40 mg total) by mouth 2 (two) times daily.   SIMVASTATIN (ZOCOR) 40 MG TABLET simvastatin (ZOCOR) 40 MG tablet      Take 1 tablet (40 mg total) by mouth at bedtime.    Take 1 tablet (40 mg total) by mouth at bedtime.  Discontinued Medications   No medications on file

## 2011-11-21 NOTE — Telephone Encounter (Signed)
Patient called to find out about her Nexium rx, she states that she is only taking her Nexium once a day and that the last time she was here to see Dr Jarold Motto she gave someone a letter about her insurance company not covering her Nexium I have advised her that once her insurance company denies her Nexium I will call her insurance company and be able to complete a prior auth on it for her, but until then I could not get that done for her. She then went on and told me to be quite and let her talk she told me that she gave the letter to someone, but I explained I am not sure where the letter was but that I would call her pharmacy for her but it looked like Dr Kriste Basque sent her a rx for Nexium twice a day, today at her office visit that I would be happy to call the pharmacy then the patient hung up on me.

## 2011-11-21 NOTE — Patient Instructions (Signed)
Today we updated your med list in our EPIC system...    Continue your current medications the same...    We refilled the meds you requested...  Today we did your follow up CXR & FASTING blood work...    We will call you w/ the results in a few days...  We gave you the combination Tetanus vaccine called the TDAP today...  Call for any questions...  Let's plan a follow up visit in 6 months, sooner if needed for problems.Marland KitchenMarland Kitchen

## 2011-11-22 LAB — VITAMIN D 25 HYDROXY (VIT D DEFICIENCY, FRACTURES): Vit D, 25-Hydroxy: 47 ng/mL (ref 30–89)

## 2011-11-24 ENCOUNTER — Telehealth: Payer: Self-pay | Admitting: Internal Medicine

## 2011-11-27 ENCOUNTER — Telehealth: Payer: Self-pay | Admitting: Pulmonary Disease

## 2011-11-27 NOTE — Telephone Encounter (Signed)
Called and lmomtcb for the pt---per SN---apply ice packs, tylenol every 4-6 hours and benadryl is ok to use.    We will mark her chart that she is allergic to the tdap vaccine.

## 2011-11-27 NOTE — Telephone Encounter (Signed)
Pt returned call.  Advised of SN's recs to apply ice packs and take tylenol and benadryl as directed.  Pt verbalized her understanding and will call if her symptoms do not improve or worsen.

## 2011-11-27 NOTE — Telephone Encounter (Signed)
FLP looks ok on Simva40> continue diet & same med... Chems, CBC, Thyroid, & VitD> All WNL.Marland KitchenMarland Kitchen  I spoke with patient about results and she verbalized understanding and had no questions.   Pt states she received a TDAP injection on 11/21/11 from her OV and states she has had an allergic reaction. On Saturday she had noticed a knot where the injection was given, it was raised, red, warm to touch, and tender. She was having HA, feeling jittery, nausea, some SOB off and on, and palpitations. Now her upper arm is puffy, red and it's moving down towards her elbow. She has been taking liquid benadryl 1-2 tsp 4-5 times a day. Pt doesn't want to come in for OV if she doesn't;t have to. Please advise SN thanks  Allergies  Allergen Reactions  . Amoxicillin-Pot Clavulanate     REACTION: causes diarrhea  . Codeine     REACTION: nausea  . Prednisone     REACTION: unable to take this med due to an eye condition

## 2012-01-31 ENCOUNTER — Telehealth: Payer: Self-pay | Admitting: Pulmonary Disease

## 2012-01-31 MED ORDER — MECLIZINE HCL 25 MG PO TABS: ORAL_TABLET | ORAL | Status: DC

## 2012-01-31 MED ORDER — ONDANSETRON HCL 8 MG PO TABS: ORAL_TABLET | ORAL | Status: DC

## 2012-01-31 NOTE — Telephone Encounter (Signed)
Per SN---call in meclizine 25mg   #50  1/2 to 1 tablet by mouth every 4 hours prn dizziness and zofran 8 mg  #25  1 po by mouth every 6 hours as needed for nausea.   thanks

## 2012-01-31 NOTE — Telephone Encounter (Signed)
Pt aware of SN recs. RX's have been sent to [facility] in [place]. Nothing further was needed

## 2012-01-31 NOTE — Telephone Encounter (Signed)
I spoke with pt and she c/o severe vertigo and nausea x this AM. Pt states the left ear feels maybe possible fluid in it--not painful/discomfort but she can feel her pulse in that ear. Pt is currently on vacation and a couple mornings ago she felt a little dizzy. Denies any other symptoms. Pt is requesting something to be called in for this. PLease advise SN thanks  [facility] [phone] Allergies  Allergen Reactions  . Amoxicillin-Pot Clavulanate     REACTION: causes diarrhea  . Codeine     REACTION: nausea  . Prednisone     REACTION: unable to take this med due to an eye condition  . Tdap (Diphth-Acell Pertussis-Tetanus)     Area raised, warm to touch, tender, swollen, pt felt jittery, nausea and some SOB

## 2012-02-11 ENCOUNTER — Telehealth: Payer: Self-pay | Admitting: Pulmonary Disease

## 2012-02-11 NOTE — Telephone Encounter (Signed)
Spoke to pharmacy and changed nexium to 1 a day per pt request and dr Jodelle Green ok

## 2012-02-11 NOTE — Telephone Encounter (Signed)
Pt is requesting to change nexium from 2 per day to once a day as her insurance will not cover twice per day. Please advise thanks

## 2012-05-23 ENCOUNTER — Encounter: Payer: Self-pay | Admitting: Pulmonary Disease

## 2012-05-23 ENCOUNTER — Encounter: Payer: Self-pay | Admitting: *Deleted

## 2012-05-23 ENCOUNTER — Ambulatory Visit (INDEPENDENT_AMBULATORY_CARE_PROVIDER_SITE_OTHER)
Admission: RE | Admit: 2012-05-23 | Discharge: 2012-05-23 | Disposition: A | Discharge status: Home / Self Care | Payer: Medicare Other | Source: Ambulatory Visit | Attending: Pulmonary Disease | Admitting: Pulmonary Disease

## 2012-05-23 ENCOUNTER — Ambulatory Visit (INDEPENDENT_AMBULATORY_CARE_PROVIDER_SITE_OTHER): Payer: Medicare Other | Admitting: Pulmonary Disease

## 2012-05-23 VITALS — BP 126/74 | HR 76 | Temp 97.7°F | Ht 69.0 in | Wt 174.4 lb

## 2012-05-23 DIAGNOSIS — E7800 Pure hypercholesterolemia, unspecified: Secondary | ICD-10-CM

## 2012-05-23 DIAGNOSIS — M545 Low back pain, unspecified: Secondary | ICD-10-CM

## 2012-05-23 DIAGNOSIS — Z23 Encounter for immunization: Secondary | ICD-10-CM

## 2012-05-23 DIAGNOSIS — F411 Generalized anxiety disorder: Secondary | ICD-10-CM

## 2012-05-23 DIAGNOSIS — D869 Sarcoidosis, unspecified: Secondary | ICD-10-CM

## 2012-05-23 DIAGNOSIS — M899 Disorder of bone, unspecified: Secondary | ICD-10-CM

## 2012-05-23 DIAGNOSIS — T364X5A Adverse effect of tetracyclines, initial encounter: Secondary | ICD-10-CM

## 2012-05-23 DIAGNOSIS — K219 Gastro-esophageal reflux disease without esophagitis: Secondary | ICD-10-CM

## 2012-05-23 DIAGNOSIS — M1990 Unspecified osteoarthritis, unspecified site: Secondary | ICD-10-CM

## 2012-05-23 DIAGNOSIS — N390 Urinary tract infection, site not specified: Secondary | ICD-10-CM

## 2012-05-23 DIAGNOSIS — K208 Other esophagitis without bleeding: Secondary | ICD-10-CM

## 2012-05-23 DIAGNOSIS — K589 Irritable bowel syndrome without diarrhea: Secondary | ICD-10-CM

## 2012-05-23 DIAGNOSIS — I059 Rheumatic mitral valve disease, unspecified: Secondary | ICD-10-CM

## 2012-05-23 DIAGNOSIS — K222 Esophageal obstruction: Secondary | ICD-10-CM

## 2012-05-23 MED ORDER — TRAMADOL HCL 50 MG PO TABS: ORAL_TABLET | ORAL | Status: DC

## 2012-05-23 NOTE — Patient Instructions (Addendum)
Today we updated your med list in our EPIC system...    Continue your current medications the same...    We wrote a new prescription for TRAMADOL 50mg  to take up to 3 times daily as needed for arthritis pain...  Today we did your follow up CXR...    We will communicate the results to you when avail...  We gave you the 2013 Flu vaccine today...  Call for any questions...  Let's plan a follow up visit in 6 months w/ FASTING blood work at that time.Marland KitchenMarland Kitchen

## 2012-05-23 NOTE — Progress Notes (Signed)
Subjective:    Patient ID: Laura [name], female    DOB: Aug 06, 1940, 71 y.o.   MRN: [id number]  HPI 71 y/o WF here for a follow up visit... she has prob Sarcoidosis w/ hx Uveitis and BHA on CXR... we are following a "watchful waiting" protocol...she continues to feel well- no cough, sputum, CP, or SOB...   ~  May 17, 2010:  she reports incr stress w/ sister Laura [name]'s situation- offered Laura [name] Rx but she declines... she had BMD 5/11- min osteopenia & stable on Calcium, MVI, Vit D... no new complaints or concerns- breathing stable;  Chol controlled on diet + Simva20;  GI is stable & up to date;  she had the 2011 Flu shot, due for PNEUMOVAX, & discussed Shingles vaccine as well.  ~  November 17, 2010:  59mo ROV & presents w/ 5d hx of URI- frontal HA, aching, sore all over, sore throat, sinus draining yellow, swollen glands, & low grade temp 100; denies cough/ sputum/ CP; we decided to check CXR- stable RML scarring, prom hila c/w adenopathy, NAD; & Labs- all essent wnl; Rx w/ Depo, Avelox, Mucinex, MMW...    Hx Sarcoid- stable via CXR;  Denies CP/ palpit, SOB, edema;  Lipids controlled on Simva40;  GI stable on Nexium & Zantac as needed;  DJD/ LBP/ Osteopenia- stable...  ~  May 23, 2011:  59mo ROV & she is c/o cough, greenish/yellow sputum, nasal congestion w/ drainage, etc- all for several weeks & not responsive to her OTC Rx;  We will treat her acute bronchitis w/ Depo, Levaquin, Mucinex, Fluids, etc (she is intol to Augmentin)...  She had GI eval by DrPatterson 5/12> chr GERD, hx 3cmHH, prev stricture dilated, hx colon polyps, etc> c/o incr reflux symptoms, bloating, pressure despite her Nexium40AM & Zantac300PM;  Nexium was increased to Bid, Carafate added Qid & she had EGD 5/12 showing mod gastritis w/ bx= chr inflamm, neg HPylori;  She also had colonoscopy at the same time showing mod divertics, sessile polyp in cecum= serrated adenoma w/ f/u planned 3-40yrs... See prob list below:   ~  Nov 21, 2011:  59mo ROV & Laura [name] is doing reasonably well w/o new complaints or concerns;     She was given Zithromax for an ear infection from an [facility] in [place]; 3/13 she developed an "irritation in my esoph" dyspagia/ odynophagia; EGD by DrPatterson revealed "Pill esophagitis" & she was treated w/ Carafate (off now), Nexium Bid, & Zantac 300Qhs;  improved now on & she will check w/ GI about a formulary PPI med...    We reviewed prob list, meds, xrays, & labs> see below>>  LABS 5/13:  FLP- at goals on Simva40;  Chems- wnl;  CBC- wnl;  TSH=1.61;  VitD=47 ADDENDUM>> she received TDAP w/ local reaction req antihist & topical Rx...  ~  May 23, 2012:  59mo ROV & Laura [name] reports some vertigo while on vacation recently & improved w/ Antivert...    Sarcoid> Hx abn CXR, she is asymptomatic; CXR today= right suprahilar nodule (likely sarcoid scarring) & chr changes...    MVP> on ASA81;  She denies CP, palpit, SOB, etc...     Chol> on Simva40; FLP 5/13 looked ok w/ TChol 159, TG 151, HDL 47, LDL 82; reviewed diet & wt reduction...    GI> GERD, IBS, Polyp> on Nexium40, Zantac300HS, CarafateQid prn, & Zofran prn;     UTIs> she reports no prob on Cranberry juice...    DJD, LBP, Osteop> on calium,  MVI, VitD; we refilled Tramadol for prn use...    Anxiety> she has a lot of family support, doesn't require meds... We reviewed prob list, meds, xrays and labs> see below for updates >> OK Flu shot today... CXR 11/13 showed similar changes- irreg nodular changes in right suprahilar region, scarring, stable adenopathy, mild atherosclerotic calcif, osteopenia...         Problem List:  Hx of UVEITIS (ICD-364.3) - eval at [facility] since 2001, given steroid injection in 2002... s/p right cataract surg 2/09 and no active uveitis seen... ~  4/11:  she tells me she was seen 10/10 & doing well, may need left cataract surg soon.  R/O SARCOIDOSIS (ICD-135) - Hx uveitis w/ eval at [facility], and subseq f/u CXR w/ CTChest showing bilat hilar  adenopathy, and w/u showing neg PPD, ACE=53, sed=13... prev PFT's 1/08 showed FVC 3.75 (111%), FEV1 2.74 (102%), %1sec=73, mid-flows=70%... being followed w/ serial films and "watchful waiting"... there is a family hx of sarcoidosis. ~   we had a f/u CXR in Feb09- streaky opacity right mid lung zone, similar to 2008...  ~  CT Chest 7/08 & 6/09 showed mediastinal > hilar adenopathy some w/ calcif (generally smaller than 1/08), area of atx/ scarring right mid zone (infer RUL) & scat <1cm nodules in lower zones w/o change... ~  CXR 2/10 showed bilat hilar prominence & scarring appears the same- NAD... labs all WNL w/ ACE=56. ~  CXR 4/11 showed mild bilat hilar prominence, scarring, NAD- osteopenia & DJD in TSpine... ACE= 37. ~  CXR 4/12 showed stable bilat hilar prominence, scarring, NAD.[name]. ~  CXR 11/13 showed similar changes- irreg nodular changes in right suprahilar region, scarring, stable adenopathy, mild atherosclerotic calcif, osteopenia...  MITRAL VALVE PROLAPSE (ICD-424.0) - on ASA 81mg /d... clinical dx w/ 2DEcho 1999 showing flat closure but no frank prolapse...  HYPERCHOLESTEROLEMIA (ICD-272.0) - on SIMVASTATIN 40mg /d now... ~  FLP 1/08 on Lip10 showed TChol 183, TG 122, HDL 45, LDL 114...  ~  FLP 2/09 on Lip10 showed TChol 183, TG 189, HDL 39, LDL 107... continue diet & change to Simva40.  ~  FLP 5/09 on Simva40 showed TChol 185, TG 163, HDL 36, LDL 117... she wants to stay on Simva40. ~  FLP 2/10 showed TChol 176, Tg 160, HDL 39, LDL 105... rec> continue same. ~  FLP 4/11 showed TChol 160, TG 131, HDL 41, LDL 93 ~  FLP 4/12 showed TChol 174, TG 148, HDL 43, LDL 102 ~  FLP 5/13 showed TChol 159, TG 151, HDL 47, LDL 82  GERD (ICD-530.81) - on NEXIUM 40mg /d & ZANTAC 300mg Qhs... ~  EGD 6/07 w/ 3cmHH, stricture, gastitis (HPylori neg)... I offered to change her Nexium to a generic but she declined- "DrPatterson told me never to change this med"... she notes occas nocturnal reflux symptoms. ~   5/12: She had GI eval by DrPatterson 5/12> chr GERD, hx 3cmHH, prev stricture dilated, hx colon polyps, etc> c/o incr reflux symptoms, bloating, pressure despite her Nexium40AM & Zantac300PM;  Nexium was increased to Bid, Carafate added Qid & she had EGD 5/12 showing mod gastritis w/ bx= chr inflamm, neg HPylori... ~  CTAbd 5/12 showed mult parenchymal nodules at lung bases (some fractionally larger), calcif hilar nodes bilat, no renal lesions (?left upper pole mass seen on sonar?). ~  EGD 3/13 by DrPatterson showed 3cmHH, esophagitis, prob "pill espohagitis", dilated... Symptoms resolved w/ Carafate, Nexium, Zantac, Xylocaine...  IRRITABLE BOWEL SYNDROME (ICD-564.1) COLONIC POLYPS, HX OF (ICD-V12.72)  ~  Colonoscopy 6/07 by DrPatterson showing divertics & polyps (adenomatous)... f/u planned 27yrs... ~  She had f/u colonoscopy 5/12 showing mod divertics, sessile polyp in cecum= serrated adenoma w/ f/u planned 3-21yrs...  UTI'S, CHRONIC (ICD-599.0) - eval by DrOttelin for urology... now off the Macrodantin and using cranberry juice without recurrent UTI, no problems...  DEGENERATIVE JOINT DISEASE (ICD-715.90) - eval by DrDalldorf w/ mod DJD in knees & hx of torn left meniscus... 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 overall improved now. ~  11/13:  TRAMADOL 50mg  Tid refilled for prn use...  LOW BACK PAIN, CHRONIC (ICD-724.2)  OSTEOPENIA (ICD-733.90) - on Calcium, MVI, Vit D... ~  BMD in 2005 showed TScore -1.2 in Spine, & -0.7 in [facility]. ~  BMD 5/09 showed TScores -1.5 in Spine & -1.1 in [facility]. ~  BMD sched 5/11 showed TScores -1.2 in Spine, and -1.2 in [facility]. ~  5/13:  She is due for f/u BMD but wants to wait for now...  ANXIETY (ICD-300.00)  Hx of BASAL CELL SKIN CANCER - she still sees her Derm in [place] yearly...  Health Maintenance: ~  GI:  followed by DrPatterson w/ colon as  above... ~  GYN:  followed by DrRichardson & doing well by pt's report... [facility]... ~  Immunizations:  she gets yearly flu shots;  has PNEUMOVAX in 1998 & repeated 10/11 (age 59);  TDAP given 5/13;  we discussed Shingles vaccine (she had bout of shingles involving her right hip & leg ~age30) & she will decide.    Past Surgical History  Procedure Date  . Vesicovaginal fistula closure w/ tah   . Basal cell carcinoma excision   . Cataract extraction     right  . Removal of mass from rt antecubital fossa   . Abdominal hysterectomy   . Upper gastrointestinal endoscopy   . Colonoscopy     Outpatient Encounter Prescriptions as of 05/23/2012  Medication Sig Dispense Refill  . aspirin 81 MG tablet Take 81 mg by mouth daily.        . B Complex Vitamins (VITAMIN-B COMPLEX PO) Take by mouth daily.        . Calcium Carbonate (CALTRATE 600 PO) Take 1 tablet by mouth 2 (two) times daily.        . Cholecalciferol (VITAMIN D) 1000 UNITS capsule Take 1,000 Units by mouth daily.        [name] esomeprazole (NEXIUM) 40 MG capsule Take 1 capsule (40 mg total) by mouth 2 (two) times daily.  180 capsule  3  . meclizine (ANTIVERT) 25 MG tablet 1/2-1 tablet by mouth every 4 hours as needed for dizziness  50 tablet  0  . Multiple Vitamins-Minerals (MULTIVITAL) tablet Take 1 tablet by mouth daily.        . Omega-3 Fatty Acids (FISH OIL) 1000 MG CAPS Take 1 capsule by mouth daily.        . ondansetron (ZOFRAN) 8 MG tablet 1 tablet by mouth every 6 hours as needed for nausea  25 tablet  0  . ranitidine (ZANTAC) 300 MG capsule Take 300 mg by mouth at bedtime.      . simvastatin (ZOCOR) 40 MG tablet Take 1 tablet (40 mg total) by mouth at bedtime.  90 tablet  3  . sucralfate (CARAFATE) 1 G tablet Take 1 tablet (1 g total) by mouth 4 (four) times daily.  120  tablet  1  . DISCONTD: traMADol (ULTRAM) 50 MG tablet Take one tablet by mouth every 6-8 hours as needed  60 tablet  0    Allergies    Allergen Reactions  . Amoxicillin-Pot Clavulanate     REACTION: causes diarrhea  . Codeine     REACTION: nausea  . Prednisone     REACTION: unable to take this med due to an eye condition  . Tdap (Diphth-Acell Pertussis-Tetanus)     Area raised, warm to touch, tender, swollen, pt felt jittery, nausea and some SOB    Review of Systems         See HPI - all other systems neg except as noted... The patient denies anorexia, fever, weight loss, weight gain, vision loss, decreased hearing, hoarseness, chest pain, syncope, dyspnea on exertion, peripheral edema, prolonged cough, headaches, hemoptysis, abdominal pain, melena, hematochezia, severe indigestion/heartburn, hematuria, incontinence, muscle weakness, suspicious skin lesions, transient blindness, difficulty walking, depression, unusual weight change, abnormal bleeding, enlarged lymph nodes, and angioedema.     Objective:   Physical Exam    WD, WN, 71 y/o WF in NAD... GENERAL:  Alert & oriented; pleasant & cooperative... HEENT:  [place]/AT, EOM-wnl, PERRLA, EACs-clear, TMs-wnl, NOSE-clear, THROAT-clear & wnl. NECK:  Supple w/ fairROM; no JVD; normal carotid impulses w/o bruits; no thyromegaly or nodules palpated; no lymphadenopathy. CHEST:  Clear to P & A; without wheezes/ rales/ or rhonchi. HEART:  Regular Rhythm; without murmurs/ rubs/ or gallops. ABDOMEN:  Soft & nontender; normal bowel sounds; no organomegaly or masses detected. EXT: without deformities, mild arthritic changes; no varicose veins/ +venous insuffic/ no edema. NEURO:  CN's intact;  no focal neuro deficits... DERM:  No lesions noted; no rash etc...  RADIOLOGY DATA:  Reviewed in the EPIC EMR & discussed w/ the patient...  LABORATORY DATA:  Reviewed in the EPIC EMR & discussed w/ the patient...   Assessment & Plan:    Acute Bronchitis>  Doing satis w/o recent exacerbation...  SARCOID>  CXR stable & she remains asymptomatic; note CTAbd findings at lung  bases...  CHOL>  Stable on the Simva40 + diet...  GERD>  On Nexium & Zantac, she is currently improved from her prev symptoms...  DJD/ LBP/ Osteopenia>  As noted she remains stable on current meds & exercise program...  Other medical issues as noted...   Patient's Medications  New Prescriptions   No medications on file  Previous Medications   ASPIRIN 81 MG TABLET    Take 81 mg by mouth daily.     B COMPLEX VITAMINS (VITAMIN-B COMPLEX PO)    Take by mouth daily.     CALCIUM CARBONATE (CALTRATE 600 PO)    Take 1 tablet by mouth 2 (two) times daily.     CHOLECALCIFEROL (VITAMIN D) 1000 UNITS CAPSULE    Take 1,000 Units by mouth daily.     ESOMEPRAZOLE (NEXIUM) 40 MG CAPSULE    Take 1 capsule (40 mg total) by mouth 2 (two) times daily.   MECLIZINE (ANTIVERT) 25 MG TABLET    1/2-1 tablet by mouth every 4 hours as needed for dizziness   MULTIPLE VITAMINS-MINERALS (MULTIVITAL) TABLET    Take 1 tablet by mouth daily.     OMEGA-3 FATTY ACIDS (FISH OIL) 1000 MG CAPS    Take 1 capsule by mouth daily.     ONDANSETRON (ZOFRAN) 8 MG TABLET    1 tablet by mouth every 6 hours as needed for nausea   RANITIDINE (ZANTAC) 300 MG CAPSULE  Take 300 mg by mouth at bedtime.   SIMVASTATIN (ZOCOR) 40 MG TABLET    Take 1 tablet (40 mg total) by mouth at bedtime.   SUCRALFATE (CARAFATE) 1 G TABLET    Take 1 tablet (1 g total) by mouth 4 (four) times daily.  Modified Medications   No medications on file  Discontinued Medications   TRAMADOL (ULTRAM) 50 MG TABLET    Take one tablet by mouth every 6-8 hours as needed

## 2012-07-03 DIAGNOSIS — H3020 Posterior cyclitis, unspecified eye: Secondary | ICD-10-CM | POA: Insufficient documentation

## 2012-07-03 DIAGNOSIS — H40049 Steroid responder, unspecified eye: Secondary | ICD-10-CM | POA: Insufficient documentation

## 2012-08-05 ENCOUNTER — Other Ambulatory Visit: Payer: Self-pay | Admitting: Gastroenterology

## 2012-08-19 ENCOUNTER — Telehealth: Payer: Self-pay | Admitting: Pulmonary Disease

## 2012-08-19 NOTE — Telephone Encounter (Signed)
lmomtcb x1 for pt 

## 2012-08-20 NOTE — Telephone Encounter (Signed)
LMTCB x 1 

## 2012-08-21 ENCOUNTER — Telehealth: Payer: Self-pay | Admitting: Pulmonary Disease

## 2012-08-21 MED ORDER — RANITIDINE HCL 300 MG PO TABS: 300.0000 mg | ORAL_TABLET | Freq: Every day | ORAL | Status: DC

## 2012-08-21 MED ORDER — RANITIDINE HCL 300 MG PO CAPS: 300.0000 mg | ORAL_CAPSULE | Freq: Every day | ORAL | Status: DC

## 2012-08-21 NOTE — Telephone Encounter (Signed)
I spoke with pt. Confirmed rx needed to be sent. Nothing further was needed

## 2012-08-21 NOTE — Telephone Encounter (Signed)
Patient returning call.

## 2012-08-21 NOTE — Telephone Encounter (Signed)
This has been changed. Nothing further waws needed

## 2012-09-19 ENCOUNTER — Telehealth: Payer: Self-pay | Admitting: Pulmonary Disease

## 2012-09-19 NOTE — Telephone Encounter (Signed)
Called and spoke with pts daughter Tobi Bastos.  She is aware of appt with TP on 3/5.  i have sent a request to [facility] to get all records faxed to our office.  This has been faxed to the medical records department at fax # [phone].  Nothing further is needed.

## 2012-09-19 NOTE — Telephone Encounter (Signed)
Please advise leigh thanks 

## 2012-09-19 NOTE — Telephone Encounter (Signed)
Per SN---  [facility] will need to fax over d/c summary, xray reports and lab reports to SN attention.   Ok to place on TP schedule ---SN is out of the office all next week.  thanks

## 2012-09-24 ENCOUNTER — Ambulatory Visit (INDEPENDENT_AMBULATORY_CARE_PROVIDER_SITE_OTHER)
Admission: RE | Admit: 2012-09-24 | Discharge: 2012-09-24 | Disposition: A | Discharge status: Home / Self Care | Payer: Medicare Other | Source: Ambulatory Visit | Attending: Adult Health | Admitting: Adult Health

## 2012-09-24 ENCOUNTER — Ambulatory Visit (INDEPENDENT_AMBULATORY_CARE_PROVIDER_SITE_OTHER): Payer: Medicare Other | Admitting: Adult Health

## 2012-09-24 ENCOUNTER — Encounter: Payer: Self-pay | Admitting: Adult Health

## 2012-09-24 VITALS — BP 130/80 | HR 76 | Temp 98.1°F | Ht 69.0 in | Wt 173.6 lb

## 2012-09-24 DIAGNOSIS — J189 Pneumonia, unspecified organism: Secondary | ICD-10-CM

## 2012-09-24 DIAGNOSIS — S32000A Wedge compression fracture of unspecified lumbar vertebra, initial encounter for closed fracture: Secondary | ICD-10-CM

## 2012-09-24 DIAGNOSIS — S32009A Unspecified fracture of unspecified lumbar vertebra, initial encounter for closed fracture: Secondary | ICD-10-CM

## 2012-09-24 NOTE — Assessment & Plan Note (Signed)
LLL PNA ? HCAP vs aspiration PNA (s/p kyphoplasty/n.v )  Currently on active tx w/ Levaquin (day 7/14)   Plan  Check xray today  Cont on levaquin  follow up 2 weeks

## 2012-09-24 NOTE — Addendum Note (Signed)
Addended by: Orma Flaming D on: 09/24/2012 10:38 AM   Modules accepted: Orders

## 2012-09-24 NOTE — Assessment & Plan Note (Signed)
S/p fall w/ L1 compression fx w/ kyphoplasty  Good results with pain relief.  Pt advised to advance act as tolerated  Please contact office for sooner follow up if symptoms do not improve or worsen or seek emergency care

## 2012-09-24 NOTE — Patient Instructions (Addendum)
I will call with xray results.  Continue on Levaquin .as directed.  Ice and heat to low back As needed   Advance activity as tolerated.  follow up Dr. Kriste Basque  In 2 weeks and As needed

## 2012-09-24 NOTE — Progress Notes (Signed)
Subjective:    Patient ID: Laura [name], female    DOB: February 14, 1941, 72 y.o.   MRN: [id number]  HPI 72 y/o WF w/ known hx of - prob Sarcoidosis w/ hx Uveitis and BHA on CXR...    ~  May 17, 2010:  she reports incr stress w/ sister Louise's situation- offered Rebeca Allegra Rx but she declines... she had BMD 5/11- min osteopenia & stable on Calcium, MVI, Vit D... no new complaints or concerns- breathing stable;  Chol controlled on diet + Simva20;  GI is stable & up to date;  she had the 2011 Flu shot, due for PNEUMOVAX, & discussed Shingles vaccine as well.  ~  November 17, 2010:  30mo ROV & presents w/ 5d hx of URI- frontal HA, aching, sore all over, sore throat, sinus draining yellow, swollen glands, & low grade temp 100; denies cough/ sputum/ CP; we decided to check CXR- stable RML scarring, prom hila c/w adenopathy, NAD; & Labs- all essent wnl; Rx w/ Depo, Avelox, Mucinex, MMW...    Hx Sarcoid- stable via CXR;  Denies CP/ palpit, SOB, edema;  Lipids controlled on Simva40;  GI stable on Nexium & Zantac as needed;  DJD/ LBP/ Osteopenia- stable...  ~  May 23, 2011:  30mo ROV & she is c/o cough, greenish/yellow sputum, nasal congestion w/ drainage, etc- all for several weeks & not responsive to her OTC Rx;  We will treat her acute bronchitis w/ Depo, Levaquin, Mucinex, Fluids, etc (she is intol to Augmentin)...  She had GI eval by DrPatterson 5/12> chr GERD, hx 3cmHH, prev stricture dilated, hx colon polyps, etc> c/o incr reflux symptoms, bloating, pressure despite her Nexium40AM & Zantac300PM;  Nexium was increased to Bid, Carafate added Qid & she had EGD 5/12 showing mod gastritis w/ bx= chr inflamm, neg HPylori;  She also had colonoscopy at the same time showing mod divertics, sessile polyp in cecum= serrated adenoma w/ f/u planned 3-58yrs... See prob list below:   ~  Nov 21, 2011:  30mo ROV & Pallas is doing reasonably well w/o new complaints or concerns;     She was given Zithromax for an ear infection from  an [facility] in [place]; 3/13 she developed an "irritation in my esoph" dyspagia/ odynophagia; EGD by DrPatterson revealed "Pill esophagitis" & she was treated w/ Carafate (off now), Nexium Bid, & Zantac 300Qhs;  improved now on & she will check w/ GI about a formulary PPI med...    We reviewed prob list, meds, xrays, & labs> see below>>  LABS 5/13:  FLP- at goals on Simva40;  Chems- wnl;  CBC- wnl;  TSH=1.61;  VitD=47 ADDENDUM>> she received TDAP w/ local reaction req antihist & topical Rx...  ~  May 23, 2012:  30mo ROV & Eriko reports some vertigo while on vacation recently & improved w/ Antivert...    Sarcoid> Hx abn CXR, she is asymptomatic; CXR today= right suprahilar nodule (likely sarcoid scarring) & chr changes...    MVP> on ASA81;  She denies CP, palpit, SOB, etc...     Chol> on Simva40; FLP 5/13 looked ok w/ TChol 159, TG 151, HDL 47, LDL 82; reviewed diet & wt reduction...    GI> GERD, IBS, Polyp> on Nexium40, Zantac300HS, CarafateQid prn, & Zofran prn;     UTIs> she reports no prob on Cranberry juice...    DJD, LBP, Osteop> on calium, MVI, VitD; we refilled Tramadol for prn use...    Anxiety> she has a lot of family support,  doesn't require meds... We reviewed prob list, meds, xrays and labs> see below for updates >> OK Flu shot today... CXR 11/13 showed similar changes- irreg nodular changes in right suprahilar region, scarring, stable adenopathy, mild atherosclerotic calcif, osteopenia...  09/24/2012 Post Hospital  Pt returns for a post hospital follow up .  Tripped going to [facility] 2/[facility] , dx w/ partial compression fx of L1  Underwent kyphoplasty. Developed a LLL PNA post procedure, Did have 1 episode of n/v post procedure.  Tx w/ Levaquin on day 7/14.  Had severe back pain . Pain was relieved completely from Kyphyoplasty.  Still feels weak. And run down. Low back stiffness at time w/ sitting. No radicular symptoms.  No difficulty walking, no leg weakness.  Leaving for [place] in 3  weeks.  No cough, fever , congestion or dyspnea.            Problem List:  Hx of UVEITIS (ICD-364.3) - eval at [facility] since 2001, given steroid injection in 2002... s/p right cataract surg 2/09 and no active uveitis seen... ~  4/11:  she tells me she was seen 10/10 & doing well, may need left cataract surg soon.  R/O SARCOIDOSIS (ICD-135) - Hx uveitis w/ eval at [facility], and subseq f/u CXR w/ CTChest showing bilat hilar adenopathy, and w/u showing neg PPD, ACE=53, sed=13... prev PFT's 1/08 showed FVC 3.75 (111%), FEV1 2.74 (102%), %1sec=73, mid-flows=70%... being followed w/ serial films and "watchful waiting"... there is a family hx of sarcoidosis. ~   we had a f/u CXR in Feb09- streaky opacity right mid lung zone, similar to 2008...  ~  CT Chest 7/08 & 6/09 showed mediastinal > hilar adenopathy some w/ calcif (generally smaller than 1/08), area of atx/ scarring right mid zone (infer RUL) & scat <1cm nodules in lower zones w/o change... ~  CXR 2/10 showed bilat hilar prominence & scarring appears the same- NAD... labs all WNL w/ ACE=56. ~  CXR 4/11 showed mild bilat hilar prominence, scarring, NAD- osteopenia & DJD in TSpine... ACE= 37. ~  CXR 4/12 showed stable bilat hilar prominence, scarring, NAD.[name]. ~  CXR 11/13 showed similar changes- irreg nodular changes in right suprahilar region, scarring, stable adenopathy, mild atherosclerotic calcif, osteopenia...  MITRAL VALVE PROLAPSE (ICD-424.0) - on ASA 81mg /d... clinical dx w/ 2DEcho 1999 showing flat closure but no frank prolapse...  HYPERCHOLESTEROLEMIA (ICD-272.0) - on SIMVASTATIN 40mg /d now... ~  FLP 1/08 on Lip10 showed TChol 183, TG 122, HDL 45, LDL 114...  ~  FLP 2/09 on Lip10 showed TChol 183, TG 189, HDL 39, LDL 107... continue diet & change to Simva40.  ~  FLP 5/09 on Simva40 showed TChol 185, TG 163, HDL 36, LDL 117... she wants to stay on Simva40. ~  FLP 2/10 showed TChol 176, Tg 160, HDL 39, LDL 105... rec> continue same. ~  FLP  4/11 showed TChol 160, TG 131, HDL 41, LDL 93 ~  FLP 4/12 showed TChol 174, TG 148, HDL 43, LDL 102 ~  FLP 5/13 showed TChol 159, TG 151, HDL 47, LDL 82  GERD (ICD-530.81) - on NEXIUM 40mg /d & ZANTAC 300mg Qhs... ~  EGD 6/07 w/ 3cmHH, stricture, gastitis (HPylori neg)... I offered to change her Nexium to a generic but she declined- "DrPatterson told me never to change this med"... she notes occas nocturnal reflux symptoms. ~  5/12: She had GI eval by DrPatterson 5/12> chr GERD, hx 3cmHH, prev stricture dilated, hx colon polyps, etc> c/o incr reflux symptoms, bloating, pressure  despite her Nexium40AM & Zantac300PM;  Nexium was increased to Bid, Carafate added Qid & she had EGD 5/12 showing mod gastritis w/ bx= chr inflamm, neg HPylori... ~  CTAbd 5/12 showed mult parenchymal nodules at lung bases (some fractionally larger), calcif hilar nodes bilat, no renal lesions (?left upper pole mass seen on sonar?). ~  EGD 3/13 by DrPatterson showed 3cmHH, esophagitis, prob "pill espohagitis", dilated... Symptoms resolved w/ Carafate, Nexium, Zantac, Xylocaine...  IRRITABLE BOWEL SYNDROME (ICD-564.1) COLONIC POLYPS, HX OF (ICD-V12.72)  ~  Colonoscopy 6/07 by DrPatterson showing divertics & polyps (adenomatous)... f/u planned 70yrs... ~  She had f/u colonoscopy 5/12 showing mod divertics, sessile polyp in cecum= serrated adenoma w/ f/u planned 3-72yrs...  UTI'S, CHRONIC (ICD-599.0) - eval by DrOttelin for urology... now off the Macrodantin and using cranberry juice without recurrent UTI, no problems...  DEGENERATIVE JOINT DISEASE (ICD-715.90) - eval by DrDalldorf w/ mod DJD in knees & hx of torn left meniscus... 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 overall improved now. ~  11/13:  TRAMADOL 50mg  Tid refilled for prn use...  LOW BACK PAIN, CHRONIC (ICD-724.2)  OSTEOPENIA (ICD-733.90) - on Calcium, MVI, Vit D... ~   BMD in 2005 showed TScore -1.2 in Spine, & -0.7 in [facility]. ~  BMD 5/09 showed TScores -1.5 in Spine & -1.1 in [facility]. ~  BMD sched 5/11 showed TScores -1.2 in Spine, and -1.2 in [facility]. ~  5/13:  She is due for f/u BMD but wants to wait for now...  ANXIETY (ICD-300.00)  Hx of BASAL CELL SKIN CANCER - she still sees her Derm in [place] yearly...  Health Maintenance: ~  GI:  followed by DrPatterson w/ colon as above... ~  GYN:  followed by DrRichardson & doing well by pt's report... [facility]... ~  Immunizations:  she gets yearly flu shots;  has PNEUMOVAX in 1998 & repeated 10/11 (age 53);  TDAP given 5/13;  we discussed Shingles vaccine (she had bout of shingles involving her right hip & leg ~age30) & she will decide.    Past Surgical History  Procedure Laterality Date  . Vesicovaginal fistula closure w/ tah    . Basal cell carcinoma excision    . Cataract extraction      right  . Removal of mass from rt antecubital fossa    . Abdominal hysterectomy    . Upper gastrointestinal endoscopy    . Colonoscopy      Outpatient Encounter Prescriptions as of 09/24/2012  Medication Sig Dispense Refill  . aspirin 81 MG tablet Take 81 mg by mouth daily.        . B Complex Vitamins (VITAMIN-B COMPLEX PO) Take by mouth daily.        . Calcium Carbonate (CALTRATE 600 PO) Take 1 tablet by mouth 2 (two) times daily.        [name] CARAFATE 1 GM/10ML suspension Take one teaspoonful (5ml) by mouth before meals and at bedtime  420 mL  1  . Cholecalciferol (VITAMIN D) 1000 UNITS capsule Take 1,000 Units by mouth daily.        [name] esomeprazole (NEXIUM) 40 MG capsule Take 1 capsule (40 mg total) by mouth 2 (two) times daily.  180 capsule  3  . meclizine (ANTIVERT) 25 MG tablet 1/2-1 tablet by mouth every 4 hours as needed for dizziness  50 tablet  0  . Multiple Vitamins-Minerals (MULTIVITAL)  tablet Take 1 tablet by mouth daily.        . Omega-3 Fatty Acids (FISH OIL) 1000 MG CAPS Take 1  capsule by mouth daily.        . ondansetron (ZOFRAN) 8 MG tablet 1 tablet by mouth every 6 hours as needed for nausea  25 tablet  0  . ranitidine (ZANTAC) 300 MG tablet Take 1 tablet (300 mg total) by mouth at bedtime.  90 tablet  3  . simvastatin (ZOCOR) 40 MG tablet Take 1 tablet (40 mg total) by mouth at bedtime.  90 tablet  3  . sucralfate (CARAFATE) 1 G tablet Take 1 tablet (1 g total) by mouth 4 (four) times daily.  56 tablet  1  . sucralfate (CARAFATE) 1 G tablet Take 1 tablet (1 g total) by mouth 4 (four) times daily.  120 tablet  1  . traMADol (ULTRAM) 50 MG tablet Take one tablet by mouth three times daily  as needed for pain  50 tablet  11   No facility-administered encounter medications on file as of 09/24/2012.    Allergies  Allergen Reactions  . Amoxicillin-Pot Clavulanate     REACTION: causes diarrhea  . Codeine     REACTION: nausea  . Levaquin (Levofloxacin In D5w)     Had itching w/ intravenous, not sure if reaction was from abx or the need to change the IV.  Does well when taken with Benadryl.  . Prednisone     REACTION: unable to take this med due to an eye condition  . Tdap (Diphth-Acell Pertussis-Tetanus)     Area raised, warm to touch, tender, swollen, pt felt jittery, nausea and some SOB    Review of Systems         Constitutional:   No  weight loss, night sweats,  Fevers, chills,  +fatigue, or  lassitude.  HEENT:   No headaches,  Difficulty swallowing,  Tooth/dental problems, or  Sore throat,                No sneezing, itching, ear ache, nasal congestion, post nasal drip,   CV:  No chest pain,  Orthopnea, PND, swelling in lower extremities, anasarca, dizziness, palpitations, syncope.   GI  No heartburn, indigestion, abdominal pain, nausea, vomiting, diarrhea, change in bowel habits, loss of appetite, bloody stools.   Resp: No shortness of breath with exertion or at rest.  No excess mucus, no productive cough,  No non-productive cough,  No coughing up of  blood.  No change in color of mucus.  No wheezing.  No chest wall deformity  Skin: no rash or lesions.  GU: no dysuria, change in color of urine, no urgency or frequency.  No flank pain, no hematuria   MS:  No joint pain or swelling.  + decreased range of motion. + back pain.  Psych:  No change in mood or affect. No depression or anxiety.  No memory loss.       Objective:   Physical Exam    WD, WN, 72y/o WF in NAD... GENERAL:  Alert & oriented; pleasant & cooperative... HEENT:  [place]/AT, EOM-wnl, PERRLA, EACs-clear, TMs-wnl, NOSE-clear, THROAT-clear & wnl. NECK:  Supple w/ fairROM; no JVD; normal carotid impulses w/o bruits; no thyromegaly or nodules palpated; no lymphadenopathy. CHEST:  Clear to P & A; without wheezes/ rales/ or rhonchi. HEART:  Regular Rhythm; without murmurs/ rubs/ or gallops. ABDOMEN:  Soft & nontender; normal bowel sounds; no organomegaly or masses detected. EXT: without  deformities, mild arthritic changes; no varicose veins/ +venous insuffic/ no edema., no te nderness along back. nml LE strength, no weakness noted. MAEWx 4  NEURO:  CN's intact;  no focal neuro deficits..., nml gait  DERM:  No lesions noted; no rash etc...   Assessment & Plan:

## 2012-09-25 ENCOUNTER — Telehealth: Payer: Self-pay | Admitting: Adult Health

## 2012-09-25 NOTE — Progress Notes (Signed)
Quick Note:  Spoke with patient informed her results/recs as listed below per TP. Verbalized understanding and nothing further needed at this time.  ______

## 2012-09-25 NOTE — Telephone Encounter (Signed)
Spoke with patient informed her results/recs as listed below per TP. Verbalized understanding and nothing further needed at this time.       Result Note    NO sign of PNA    May stop Levaquin after 10 days are finished .    follow up as planned in 6 weeks    Cont w/ ov recs   Please contact office for sooner follow up if symptoms do not improve or worsen or seek emergency care

## 2012-10-15 ENCOUNTER — Ambulatory Visit: Payer: Medicare Other | Admitting: Adult Health

## 2012-12-02 ENCOUNTER — Ambulatory Visit (INDEPENDENT_AMBULATORY_CARE_PROVIDER_SITE_OTHER): Payer: Medicare Other | Admitting: Pulmonary Disease

## 2012-12-02 ENCOUNTER — Other Ambulatory Visit (INDEPENDENT_AMBULATORY_CARE_PROVIDER_SITE_OTHER): Payer: Medicare Other

## 2012-12-02 ENCOUNTER — Encounter: Payer: Self-pay | Admitting: Pulmonary Disease

## 2012-12-02 VITALS — BP 128/72 | HR 81 | Temp 97.4°F | Ht 69.0 in | Wt 174.0 lb

## 2012-12-02 DIAGNOSIS — M949 Disorder of cartilage, unspecified: Secondary | ICD-10-CM

## 2012-12-02 DIAGNOSIS — F411 Generalized anxiety disorder: Secondary | ICD-10-CM

## 2012-12-02 DIAGNOSIS — K219 Gastro-esophageal reflux disease without esophagitis: Secondary | ICD-10-CM

## 2012-12-02 DIAGNOSIS — M899 Disorder of bone, unspecified: Secondary | ICD-10-CM

## 2012-12-02 DIAGNOSIS — N390 Urinary tract infection, site not specified: Secondary | ICD-10-CM

## 2012-12-02 DIAGNOSIS — D869 Sarcoidosis, unspecified: Secondary | ICD-10-CM

## 2012-12-02 DIAGNOSIS — E7800 Pure hypercholesterolemia, unspecified: Secondary | ICD-10-CM

## 2012-12-02 DIAGNOSIS — Z860100 Personal history of colon polyps, unspecified: Secondary | ICD-10-CM

## 2012-12-02 DIAGNOSIS — S32000D Wedge compression fracture of unspecified lumbar vertebra, subsequent encounter for fracture with routine healing: Secondary | ICD-10-CM

## 2012-12-02 DIAGNOSIS — M1990 Unspecified osteoarthritis, unspecified site: Secondary | ICD-10-CM

## 2012-12-02 DIAGNOSIS — K589 Irritable bowel syndrome without diarrhea: Secondary | ICD-10-CM

## 2012-12-02 DIAGNOSIS — I059 Rheumatic mitral valve disease, unspecified: Secondary | ICD-10-CM

## 2012-12-02 LAB — BASIC METABOLIC PANEL
BUN: 12 mg/dL (ref 6–23)
CO2: 29 mEq/L (ref 19–32)
Chloride: 103 mEq/L (ref 96–112)
Creatinine, Ser: 0.7 mg/dL (ref 0.4–1.2)
Potassium: 4.2 mEq/L (ref 3.5–5.1)

## 2012-12-02 LAB — CBC WITH DIFFERENTIAL/PLATELET
Basophils Absolute: 0 10*3/uL (ref 0.0–0.1)
Eosinophils Absolute: 0.2 10*3/uL (ref 0.0–0.7)
Lymphocytes Relative: 27.6 % (ref 12.0–46.0)
MCHC: 34.4 g/dL (ref 30.0–36.0)
MCV: 89.1 fl (ref 78.0–100.0)
Monocytes Absolute: 0.4 10*3/uL (ref 0.1–1.0)
Neutrophils Relative %: 61.6 % (ref 43.0–77.0)
Platelets: 235 10*3/uL (ref 150.0–400.0)
RBC: 5.08 Mil/uL (ref 3.87–5.11)
WBC: 5.4 10*3/uL (ref 4.5–10.5)

## 2012-12-02 LAB — HEPATIC FUNCTION PANEL
ALT: 18 U/L (ref 0–35)
AST: 19 U/L (ref 0–37)
Bilirubin, Direct: 0.2 mg/dL (ref 0.0–0.3)
Total Bilirubin: 0.8 mg/dL (ref 0.3–1.2)
Total Protein: 7.8 g/dL (ref 6.0–8.3)

## 2012-12-02 LAB — TSH: TSH: 1.74 u[IU]/mL (ref 0.35–5.50)

## 2012-12-02 LAB — LIPID PANEL
Cholesterol: 165 mg/dL (ref 0–200)
LDL Cholesterol: 93 mg/dL (ref 0–99)
Triglycerides: 157 mg/dL — ABNORMAL HIGH (ref 0.0–149.0)

## 2012-12-02 MED ORDER — RANITIDINE HCL 300 MG PO TABS: 300.0000 mg | ORAL_TABLET | Freq: Every day | ORAL | Status: DC

## 2012-12-02 MED ORDER — ONDANSETRON HCL 8 MG PO TABS: ORAL_TABLET | ORAL | Status: DC

## 2012-12-02 MED ORDER — SIMVASTATIN 40 MG PO TABS: 40.0000 mg | ORAL_TABLET | Freq: Every day | ORAL | Status: DC

## 2012-12-02 MED ORDER — ESOMEPRAZOLE MAGNESIUM 40 MG PO CPDR: 40.0000 mg | DELAYED_RELEASE_CAPSULE | Freq: Two times a day (BID) | ORAL | Status: DC

## 2012-12-02 MED ORDER — MECLIZINE HCL 25 MG PO TABS: ORAL_TABLET | ORAL | Status: DC

## 2012-12-02 NOTE — Patient Instructions (Addendum)
Today we updated your med list in our EPIC system...    Continue your current medications the same...  Today we did your follow up FASTING blood work...    We will contact you w/ the results when available...   Stay as active as possible, and PLEASE be careful...  Call for any questions...  Let's plan a follow up visit in 32mo, sooner if needed for problems.Marland KitchenMarland Kitchen

## 2012-12-03 LAB — VITAMIN D 25 HYDROXY (VIT D DEFICIENCY, FRACTURES): Vit D, 25-Hydroxy: 52 ng/mL (ref 30–89)

## 2012-12-08 ENCOUNTER — Telehealth: Payer: Self-pay | Admitting: Pulmonary Disease

## 2012-12-08 MED ORDER — AZITHROMYCIN 250 MG PO TABS: ORAL_TABLET | ORAL | Status: DC

## 2012-12-08 NOTE — Telephone Encounter (Signed)
i spoke with pt and is aware of SN recs. rx has been sent. Nothing further was needed

## 2012-12-08 NOTE — Telephone Encounter (Signed)
Per SN---  zpak #1  Take as directed otc antihistamine  Zyrtec 1 daily mucinex 2 po bid Fluids Nasal saline spray

## 2012-12-08 NOTE — Telephone Encounter (Signed)
I spoke with pt. She c/o cough w/ clear phlem (occasionally will have chunks of green-yellow-brown phlem, sore throat, head congestion, drainage, low grade fever of 99.8, blowing out discolored phlem as well xfriday. Per pt her throat looks red. She is requesting to have something called in. Please advise SN thanks Last OV 12/02/12 Pending 06/03/13 Allergies  Allergen Reactions  . Amoxicillin-Pot Clavulanate     REACTION: causes diarrhea  . Codeine     REACTION: nausea  . Levaquin (Levofloxacin In D5w)     Had itching w/ intravenous, not sure if reaction was from abx or the need to change the IV.  Does well when taken with Benadryl.  . Prednisone     REACTION: unable to take this med due to an eye condition  . Tdap (Diphth-Acell Pertussis-Tetanus)     Area raised, warm to touch, tender, swollen, pt felt jittery, nausea and some SOB

## 2013-02-21 NOTE — Progress Notes (Signed)
Subjective:    Patient ID: Laura [name], female    DOB: 15-Jan-1941, 72 y.o.   MRN: [id number]  HPI 72 y/o WF here for a follow up visit... she has prob Sarcoidosis w/ hx Uveitis and BHA on CXR... we are following a "watchful waiting" protocol...she continues to feel well- no cough, sputum, CP, or SOB...   ~  May 23, 2011:  14mo ROV & she is c/o cough, greenish/yellow sputum, nasal congestion w/ drainage, etc- all for several weeks & not responsive to her OTC Rx;  We will treat her acute bronchitis w/ Depo, Levaquin, Mucinex, Fluids, etc (she is intol to Augmentin)...  She had GI eval by DrPatterson 5/12> chr GERD, hx 3cmHH, prev stricture dilated, hx colon polyps, etc> c/o incr reflux symptoms, bloating, pressure despite her Nexium40AM & Zantac300PM;  Nexium was increased to Bid, Carafate added Qid & she had EGD 5/12 showing mod gastritis w/ bx= chr inflamm, neg HPylori;  She also had colonoscopy at the same time showing mod divertics, sessile polyp in cecum= serrated adenoma w/ f/u planned 3-53yrs... See prob list below:   ~  Nov 21, 2011:  14mo ROV & Laura [name] is doing reasonably well w/o new complaints or concerns;     She was given Zithromax for an ear infection from an [facility] in [place]; 3/13 she developed an "irritation in my esoph" dyspagia/ odynophagia; EGD by DrPatterson revealed "Pill esophagitis" & she was treated w/ Carafate (off now), Nexium Bid, & Zantac 300Qhs;  improved now on & she will check w/ GI about a formulary PPI med...    We reviewed prob list, meds, xrays, & labs> see below>>  LABS 5/13:  FLP- at goals on Simva40;  Chems- wnl;  CBC- wnl;  TSH=1.61;  VitD=47 ADDENDUM>> she received TDAP w/ local reaction req antihist & topical Rx...  ~  May 23, 2012:  14mo ROV & Laura [name] reports some vertigo while on vacation recently & improved w/ Antivert...    Sarcoid> Hx abn CXR, she is asymptomatic; CXR today= right suprahilar nodule (likely sarcoid scarring) & chr changes...    MVP> on  ASA81;  She denies CP, palpit, SOB, etc...     Chol> on Simva40; FLP 5/13 looked ok w/ TChol 159, TG 151, HDL 47, LDL 82; reviewed diet & wt reduction...    GI> GERD, IBS, Polyp> on Nexium40, Zantac300HS, CarafateQid prn, & Zofran prn;     UTIs> she reports no prob on Cranberry juice...    DJD, LBP, Osteop> on calium, MVI, VitD; we refilled Tramadol for prn use...    Anxiety> she has a lot of family support, doesn't require meds... We reviewed prob list, meds, xrays and labs> see below for updates >> OK Flu shot today... CXR 11/13 showed similar changes- irreg nodular changes in right suprahilar region, scarring, stable adenopathy, mild atherosclerotic calcif, osteopenia...   ~  Dec 02, 2012:  14mo ROV & Laura [name] reports that in Feb2014 she fell while getting up at night & hit her back w/ severe pain & L1 compression fx- adm to [facility] for 2d w/ kyphoplasty & good pain relief; also told to have LLLpneumonia treated w/ Levaquin; she had f/u here by TP 3/14 & improved; since then she is back to normal- had nice [place] vacation, feeling well, etc...  We reviewed the following medical problems during today's office visit >>     Sarcoid> Hx abn CXR, she is asymptomatic; baseline CXR w/ right suprahilar nodule, adenop, scarring &  chr changes; ?LLLpneum 2/14 in [place] treated w/ Levaquin...    MVP> on ASA81;  She denies CP, palpit, SOB, etc...     Chol> on Simva40; FLP 5/14 showed TChol 165, TG 157, HDL 41, LDL 93; reviewed diet & wt reduction...    GI> GERD, IBS, Polyp> on Nexium40Bid, Zantac300HS, CarafateQid prn, & Zofran prn; she remains asymptomatic w/o abd pain, n/v, c/d, blood seen...    UTIs> she reports no prob on Cranberry juice...    DJD, LBP, L1 compression w/ kyphoplasty, Osteop> on calium, MVI, VitD; we refilled Tramadol for prn use; she fell 2/14 w/ L1 compression & kyphoplasty in [place]; she needs f/u BMD.    Anxiety> she has a lot of family support, doesn't require meds... We  reviewed prob list, meds, xrays and labs> see below for updates >>  CXR 3/14 showed normal heart size, atherosclerotic Ao, chr scarring from underlying sarcoid, NAD... LABS 5/14:  FLP- at goals on Simva40;  Chems- wnl;  CBC- wnl;  TSH=1.74;  VitD=52...           Problem List:  Hx of UVEITIS (ICD-364.3) - eval at [facility] since 2001, given steroid injection in 2002... s/p right cataract surg 2/09 and no active uveitis seen... ~  4/11:  she tells me she was seen 10/10 & doing well, may need left cataract surg soon.  R/O SARCOIDOSIS (ICD-135) - Hx uveitis w/ eval at [facility], and subseq f/u CXR w/ CTChest showing bilat hilar adenopathy, and w/u showing neg PPD, ACE=53, sed=13... prev PFT's 1/08 showed FVC 3.75 (111%), FEV1 2.74 (102%), %1sec=73, mid-flows=70%... being followed w/ serial films and "watchful waiting"... there is a family hx of sarcoidosis. ~   we had a f/u CXR in Feb09- streaky opacity right mid lung zone, similar to 2008...  ~  CT Chest 7/08 & 6/09 showed mediastinal > hilar adenopathy some w/ calcif (generally smaller than 1/08), area of atx/ scarring right mid zone (infer RUL) & scat <1cm nodules in lower zones w/o change... ~  CXR 2/10 showed bilat hilar prominence & scarring appears the same- NAD... labs all WNL w/ ACE=56. ~  CXR 4/11 showed mild bilat hilar prominence, scarring, NAD- osteopenia & DJD in TSpine... ACE= 37. ~  CXR 4/12 showed stable bilat hilar prominence, scarring, NAD.[name]. ~  CXR 11/13 showed similar changes- irreg nodular changes in right suprahilar region, scarring, stable adenopathy, mild atherosclerotic calcif, osteopenia... ~  CXR 3/14 showed normal heart size, atherosclerotic Ao, chr scarring from underlying sarcoid, NAD...  MITRAL VALVE PROLAPSE (ICD-424.0) - on ASA 81mg /d... clinical dx w/ 2DEcho 1999 showing flat closure but no frank prolapse...  HYPERCHOLESTEROLEMIA (ICD-272.0) - on SIMVASTATIN 40mg /d now... ~  FLP 1/08 on Lip10 showed TChol 183, TG 122,  HDL 45, LDL 114...  ~  FLP 2/09 on Lip10 showed TChol 183, TG 189, HDL 39, LDL 107... continue diet & change to Simva40.  ~  FLP 5/09 on Simva40 showed TChol 185, TG 163, HDL 36, LDL 117... she wants to stay on Simva40. ~  FLP 2/10 showed TChol 176, Tg 160, HDL 39, LDL 105... rec> continue same. ~  FLP 4/11 showed TChol 160, TG 131, HDL 41, LDL 93 ~  FLP 4/12 showed TChol 174, TG 148, HDL 43, LDL 102 ~  FLP 5/13 showed TChol 159, TG 151, HDL 47, LDL 82 ~  FLP 5/14 showed TChol 165, TG 157, HDL 41, LDL 93  GERD (ICD-530.81) - on NEXIUM 40mg /d & ZANTAC 300mg Qhs... ~  EGD 6/07 w/ 3cmHH, stricture, gastitis (HPylori neg)... I offered to change her Nexium to a generic but she declined- "DrPatterson told me never to change this med"... she notes occas nocturnal reflux symptoms. ~  5/12: She had GI eval by DrPatterson 5/12> chr GERD, hx 3cmHH, prev stricture dilated, hx colon polyps, etc> c/o incr reflux symptoms, bloating, pressure despite her Nexium40AM & Zantac300PM;  Nexium was increased to Bid, Carafate added Qid & she had EGD 5/12 showing mod gastritis w/ bx= chr inflamm, neg HPylori... ~  CTAbd 5/12 showed mult parenchymal nodules at lung bases (some fractionally larger), calcif hilar nodes bilat, no renal lesions (?left upper pole mass seen on sonar?). ~  EGD 3/13 by DrPatterson showed 3cmHH, esophagitis, prob "pill espohagitis", dilated... Symptoms resolved w/ Carafate, Nexium, Zantac, Xylocaine... ~  5/14: on Nexium40Bid, Zantac300HS, CarafateQid prn, & Zofran prn; she remains asymptomatic w/o abd pain, n/v, c/d, blood seen...  IRRITABLE BOWEL SYNDROME (ICD-564.1) COLONIC POLYPS, HX OF (ICD-V12.72)  ~  Colonoscopy 6/07 by DrPatterson showing divertics & polyps (adenomatous)... f/u planned 59yrs... ~  She had f/u colonoscopy 5/12 showing mod divertics, sessile polyp in cecum= serrated adenoma w/ f/u planned 3-64yrs...  UTI'S, CHRONIC (ICD-599.0) - eval by DrOttelin for urology... now off the  Macrodantin and using cranberry juice without recurrent UTI, no problems...  DEGENERATIVE JOINT DISEASE (ICD-715.90) - eval by DrDalldorf w/ mod DJD in knees & hx of torn left meniscus... 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 overall improved now. ~  11/13:  TRAMADOL 50mg  Tid refilled for prn use...  LOW BACK PAIN, CHRONIC (ICD-724.2) ~  2/14: she fell while getting up at night & hit her back w/ severe pain & L1 compression fx- adm to [facility] for 2d w/ kyphoplasty & good pain relief...  OSTEOPENIA (ICD-733.90) - on Calcium, MVI, Vit D... ~  BMD in 2005 showed TScore -1.2 in Spine, & -0.7 in [facility]. ~  BMD 5/09 showed TScores -1.5 in Spine & -1.1 in [facility]. ~  BMD sched 5/11 showed TScores -1.2 in Spine, and -1.2 in [facility]. ~  5/13 & 5/14:  She is due for f/u BMD but wants to wait for now...  ANXIETY (ICD-300.00)  Hx of BASAL CELL SKIN CANCER - she still sees her Derm in [place] yearly...  Health Maintenance: ~  GI:  followed by DrPatterson w/ colon as above... ~  GYN:  followed by DrRichardson & doing well by pt's report... [facility]... ~  Immunizations:  she gets yearly flu shots;  has PNEUMOVAX in 1998 & repeated 10/11 (age 31);  TDAP given 5/13;  we discussed Shingles vaccine (she had bout of shingles involving her right hip & leg ~age30) & she will decide.    Past Surgical History  Procedure Laterality Date  . Vesicovaginal fistula closure w/ tah    . Basal cell carcinoma excision    . Cataract extraction      right  . Removal of mass from rt antecubital fossa    . Abdominal hysterectomy    . Upper gastrointestinal endoscopy    . Colonoscopy      Outpatient Encounter Prescriptions as of 12/02/2012  Medication Sig Dispense Refill  . aspirin 81 MG tablet Take 81 mg by mouth daily.        . B Complex Vitamins (VITAMIN-B COMPLEX PO) Take by  mouth daily.        [name]  Calcium Carbonate (CALTRATE 600 PO) Take 1 tablet by mouth 2 (two) times daily.        [name] CARAFATE 1 GM/10ML suspension Take one teaspoonful (5ml) by mouth before meals and at bedtime  420 mL  1  . Cholecalciferol (VITAMIN D) 1000 UNITS capsule Take 1,000 Units by mouth daily.        [name] esomeprazole (NEXIUM) 40 MG capsule Take 1 capsule (40 mg total) by mouth 2 (two) times daily.  180 capsule  3  . meclizine (ANTIVERT) 25 MG tablet 1/2-1 tablet by mouth every 4 hours as needed for dizziness  50 tablet  11  . Multiple Vitamins-Minerals (MULTIVITAL) tablet Take 1 tablet by mouth daily.        . Omega-3 Fatty Acids (FISH OIL) 1000 MG CAPS Take 1 capsule by mouth daily.        . ondansetron (ZOFRAN) 8 MG tablet 1 tablet by mouth every 6 hours as needed for nausea  25 tablet  5  . ranitidine (ZANTAC) 300 MG tablet Take 1 tablet (300 mg total) by mouth at bedtime.  90 tablet  3  . simvastatin (ZOCOR) 40 MG tablet Take 1 tablet (40 mg total) by mouth at bedtime.  90 tablet  3  . traMADol (ULTRAM) 50 MG tablet Take one tablet by mouth three times daily  as needed for pain  50 tablet  11  . [DISCONTINUED] esomeprazole (NEXIUM) 40 MG capsule Take 1 capsule (40 mg total) by mouth 2 (two) times daily.  180 capsule  3  . [DISCONTINUED] meclizine (ANTIVERT) 25 MG tablet 1/2-1 tablet by mouth every 4 hours as needed for dizziness  50 tablet  0  . [DISCONTINUED] ondansetron (ZOFRAN) 8 MG tablet 1 tablet by mouth every 6 hours as needed for nausea  25 tablet  0  . [DISCONTINUED] ranitidine (ZANTAC) 300 MG tablet Take 1 tablet (300 mg total) by mouth at bedtime.  90 tablet  3  . [DISCONTINUED] simvastatin (ZOCOR) 40 MG tablet Take 1 tablet (40 mg total) by mouth at bedtime.  90 tablet  3  . [DISCONTINUED] sucralfate (CARAFATE) 1 G tablet Take 1 tablet (1 g total) by mouth 4 (four) times daily.  56 tablet  1  . [DISCONTINUED] sucralfate (CARAFATE) 1 G tablet Take 1 tablet (1 g total) by mouth 4  (four) times daily.  120 tablet  1   No facility-administered encounter medications on file as of 12/02/2012.    Allergies  Allergen Reactions  . Amoxicillin-Pot Clavulanate     REACTION: causes diarrhea  . Codeine     REACTION: nausea  . Levaquin (Levofloxacin In D5w)     Had itching w/ intravenous, not sure if reaction was from abx or the need to change the IV.  Does well when taken with Benadryl.  . Prednisone     REACTION: unable to take this med due to an eye condition  . Tdap (Diphth-Acell Pertussis-Tetanus)     Area raised, warm to touch, tender, swollen, pt felt jittery, nausea and some SOB    Review of Systems         See HPI - all other systems neg except as noted... The patient denies anorexia, fever, weight loss, weight gain, vision loss, decreased hearing, hoarseness, chest pain, syncope, dyspnea on exertion, peripheral edema, prolonged cough, headaches, hemoptysis, abdominal pain, melena, hematochezia, severe indigestion/heartburn, hematuria, incontinence, muscle weakness, suspicious skin lesions, transient blindness, difficulty walking, depression, unusual weight change, abnormal bleeding, enlarged lymph  nodes, and angioedema.     Objective:   Physical Exam    WD, WN, 72 y/o WF in NAD... GENERAL:  Alert & oriented; pleasant & cooperative... HEENT:  [place]/AT, EOM-wnl, PERRLA, EACs-clear, TMs-wnl, NOSE-clear, THROAT-clear & wnl. NECK:  Supple w/ fairROM; no JVD; normal carotid impulses w/o bruits; no thyromegaly or nodules palpated; no lymphadenopathy. CHEST:  Clear to P & A; without wheezes/ rales/ or rhonchi. HEART:  Regular Rhythm; without murmurs/ rubs/ or gallops. ABDOMEN:  Soft & nontender; normal bowel sounds; no organomegaly or masses detected. EXT: without deformities, mild arthritic changes; no varicose veins/ +venous insuffic/ no edema. NEURO:  CN's intact;  no focal neuro deficits... DERM:  No lesions noted; no rash etc...  RADIOLOGY DATA:  Reviewed in the  EPIC EMR & discussed w/ the patient...  LABORATORY DATA:  Reviewed in the EPIC EMR & discussed w/ the patient...   Assessment & Plan:    Acute Bronchitis>  Doing satis w/o recent exacerbation...  SARCOID>  CXR stable & she remains asymptomatic; note CTAbd findings at lung bases...  CHOL>  Stable on the Simva40 + diet...  GERD>  On Nexium & Zantac, she is currently improved from her prev symptoms...  DJD/ LBP/ Osteopenia>  As noted she remains stable on current meds & exercise program...  Compression fx L1 after fall> she is s/p L1 kyphoplasty...  Other medical issues as noted...   Patient's Medications  New Prescriptions   AZITHROMYCIN (ZITHROMAX) 250 MG TABLET    Take as directed  Previous Medications   ASPIRIN 81 MG TABLET    Take 81 mg by mouth daily.     B COMPLEX VITAMINS (VITAMIN-B COMPLEX PO)    Take by mouth daily.     CALCIUM CARBONATE (CALTRATE 600 PO)    Take 1 tablet by mouth 2 (two) times daily.     CARAFATE 1 GM/10ML SUSPENSION    Take one teaspoonful (5ml) by mouth before meals and at bedtime   CHOLECALCIFEROL (VITAMIN D) 1000 UNITS CAPSULE    Take 1,000 Units by mouth daily.     MULTIPLE VITAMINS-MINERALS (MULTIVITAL) TABLET    Take 1 tablet by mouth daily.     OMEGA-3 FATTY ACIDS (FISH OIL) 1000 MG CAPS    Take 1 capsule by mouth daily.     TRAMADOL (ULTRAM) 50 MG TABLET    Take one tablet by mouth three times daily  as needed for pain  Modified Medications   Modified Medication Previous Medication   ESOMEPRAZOLE (NEXIUM) 40 MG CAPSULE esomeprazole (NEXIUM) 40 MG capsule      Take 1 capsule (40 mg total) by mouth 2 (two) times daily.    Take 1 capsule (40 mg total) by mouth 2 (two) times daily.   MECLIZINE (ANTIVERT) 25 MG TABLET meclizine (ANTIVERT) 25 MG tablet      1/2-1 tablet by mouth every 4 hours as needed for dizziness    1/2-1 tablet by mouth every 4 hours as needed for dizziness   ONDANSETRON (ZOFRAN) 8 MG TABLET ondansetron (ZOFRAN) 8 MG tablet       1 tablet by mouth every 6 hours as needed for nausea    1 tablet by mouth every 6 hours as needed for nausea   RANITIDINE (ZANTAC) 300 MG TABLET ranitidine (ZANTAC) 300 MG tablet      Take 1 tablet (300 mg total) by mouth at bedtime.    Take 1 tablet (300 mg total) by mouth at bedtime.   SIMVASTATIN (  ZOCOR) 40 MG TABLET simvastatin (ZOCOR) 40 MG tablet      Take 1 tablet (40 mg total) by mouth at bedtime.    Take 1 tablet (40 mg total) by mouth at bedtime.  Discontinued Medications   SUCRALFATE (CARAFATE) 1 G TABLET    Take 1 tablet (1 g total) by mouth 4 (four) times daily.   SUCRALFATE (CARAFATE) 1 G TABLET    Take 1 tablet (1 g total) by mouth 4 (four) times daily.

## 2013-03-30 ENCOUNTER — Telehealth: Payer: Self-pay | Admitting: Pulmonary Disease

## 2013-03-30 MED ORDER — CIPROFLOXACIN HCL 250 MG PO TABS: 250.0000 mg | ORAL_TABLET | Freq: Two times a day (BID) | ORAL | Status: DC

## 2013-03-30 NOTE — Telephone Encounter (Signed)
I spoke with pt. She reports of having frequent, painfule, burning sensation when she urinates. She reports this started earlier this afternoon. She is requesting to have something called in for this bc it is getting worse. Pt lives in [place]. Please advise SN thanks Last OV 12/02/12  Allergies  Allergen Reactions  . Amoxicillin-Pot Clavulanate     REACTION: causes diarrhea  . Codeine     REACTION: nausea  . Levaquin [Levofloxacin In D5w]     Had itching w/ intravenous, not sure if reaction was from abx or the need to change the IV.  Does well when taken with Benadryl.  . Prednisone     REACTION: unable to take this med due to an eye condition  . Tdap [Diphth-Acell Pertussis-Tetanus]     Area raised, warm to touch, tender, swollen, pt felt jittery, nausea and some SOB

## 2013-03-30 NOTE — Telephone Encounter (Signed)
Per SN---  Call in cipro 250 mg  #14   1 po bid

## 2013-03-30 NOTE — Telephone Encounter (Signed)
Spoke with patient, made her aware of recs per Dr. Kriste Basque Rx has been sent to verified pharmacy and nothing further needed at this time

## 2013-06-03 ENCOUNTER — Ambulatory Visit (INDEPENDENT_AMBULATORY_CARE_PROVIDER_SITE_OTHER): Payer: Medicare Other | Admitting: Pulmonary Disease

## 2013-06-03 ENCOUNTER — Encounter: Payer: Self-pay | Admitting: Pulmonary Disease

## 2013-06-03 VITALS — BP 140/72 | HR 76 | Temp 98.5°F | Ht 69.0 in | Wt 174.0 lb

## 2013-06-03 DIAGNOSIS — M545 Low back pain, unspecified: Secondary | ICD-10-CM

## 2013-06-03 DIAGNOSIS — F411 Generalized anxiety disorder: Secondary | ICD-10-CM

## 2013-06-03 DIAGNOSIS — I059 Rheumatic mitral valve disease, unspecified: Secondary | ICD-10-CM

## 2013-06-03 DIAGNOSIS — Z860100 Personal history of colon polyps, unspecified: Secondary | ICD-10-CM

## 2013-06-03 DIAGNOSIS — E7800 Pure hypercholesterolemia, unspecified: Secondary | ICD-10-CM

## 2013-06-03 DIAGNOSIS — K589 Irritable bowel syndrome without diarrhea: Secondary | ICD-10-CM

## 2013-06-03 DIAGNOSIS — M1990 Unspecified osteoarthritis, unspecified site: Secondary | ICD-10-CM

## 2013-06-03 DIAGNOSIS — M899 Disorder of bone, unspecified: Secondary | ICD-10-CM

## 2013-06-03 DIAGNOSIS — K219 Gastro-esophageal reflux disease without esophagitis: Secondary | ICD-10-CM

## 2013-06-03 DIAGNOSIS — D869 Sarcoidosis, unspecified: Secondary | ICD-10-CM

## 2013-06-03 DIAGNOSIS — IMO0002 Reserved for concepts with insufficient information to code with codable children: Secondary | ICD-10-CM

## 2013-06-03 DIAGNOSIS — S32000D Wedge compression fracture of unspecified lumbar vertebra, subsequent encounter for fracture with routine healing: Secondary | ICD-10-CM

## 2013-06-03 MED ORDER — ONDANSETRON HCL 8 MG PO TABS: ORAL_TABLET | ORAL | Status: DC

## 2013-06-03 MED ORDER — MECLIZINE HCL 25 MG PO TABS: ORAL_TABLET | ORAL | Status: DC

## 2013-06-03 MED ORDER — SUCRALFATE 1 GM/10ML PO SUSP: ORAL | Status: DC

## 2013-06-03 NOTE — Patient Instructions (Signed)
Today we updated your med list in our EPIC system...    Continue your current medications the same...  We discussed increasing your exercise program- silver sneakers is a great idea...  We refilled the meds you requested...  Call for any questions...  Let's plan a follow up visit in 25mo, sooner if needed for problems.Marland KitchenMarland Kitchen

## 2013-06-03 NOTE — Progress Notes (Signed)
Subjective:    Patient ID: Laura [name], female    DOB: 26-Feb-1941, 72 y.o.   MRN: [id number]  HPI 72 y/o WF here for a follow up visit... she has prob Sarcoidosis w/ hx Uveitis and BHA on CXR... we are following a "watchful waiting" protocol...she continues to feel well- no cough, sputum, CP, or SOB...   ~  Nov 21, 2011:  90mo ROV & Laura [name] is doing reasonably well w/o new complaints or concerns;     She was given Zithromax for an ear infection from an [facility] in [place]; 3/13 she developed an "irritation in my esoph" dyspagia/ odynophagia; EGD by DrPatterson revealed "Pill esophagitis" & she was treated w/ Carafate (off now), Nexium Bid, & Zantac 300Qhs;  improved now on & she will check w/ GI about a formulary PPI med...    We reviewed prob list, meds, xrays, & labs> see below>>  LABS 5/13:  FLP- at goals on Simva40;  Chems- wnl;  CBC- wnl;  TSH=1.61;  VitD=47 ADDENDUM>> she received TDAP w/ local reaction req antihist & topical Rx...  ~  May 23, 2012:  90mo ROV & Laura [name] reports some vertigo while on vacation recently & improved w/ Antivert...    Sarcoid> Hx abn CXR, she is asymptomatic; CXR today= right suprahilar nodule (likely sarcoid scarring) & chr changes...    MVP> on ASA81;  She denies CP, palpit, SOB, etc...     Chol> on Simva40; FLP 5/13 looked ok w/ TChol 159, TG 151, HDL 47, LDL 82; reviewed diet & wt reduction...    GI> GERD, IBS, Polyp> on Nexium40, Zantac300HS, CarafateQid prn, & Zofran prn;     UTIs> she reports no prob on Cranberry juice...    DJD, LBP, Osteop> on calium, MVI, VitD; we refilled Tramadol for prn use...    Anxiety> she has a lot of family support, doesn't require meds... We reviewed prob list, meds, xrays and labs> see below for updates >> OK Flu shot today... CXR 11/13 showed similar changes- irreg nodular changes in right suprahilar region, scarring, stable adenopathy, mild atherosclerotic calcif, osteopenia...   ~  Dec 02, 2012:  90mo ROV & Laura [name] reports that  in Feb2014 she fell while getting up at night & hit her back w/ severe pain & L1 compression fx- adm to [facility] for 2d w/ kyphoplasty & good pain relief; also told to have LLLpneumonia treated w/ Levaquin; she had f/u here by TP 3/14 & improved; since then she is back to normal- had nice [place] vacation, feeling well, etc...  We reviewed the following medical problems during today's office visit >>     Sarcoid> Hx abn CXR, she is asymptomatic; baseline CXR w/ right suprahilar nodule, adenop, scarring & chr changes; ?LLLpneum 2/14 in [place] treated w/ Levaquin...    MVP> on ASA81;  She denies CP, palpit, SOB, etc...     Chol> on Simva40; FLP 5/14 showed TChol 165, TG 157, HDL 41, LDL 93; reviewed diet & wt reduction...    GI> GERD, IBS, Polyp> on Nexium40Bid, Zantac300HS, CarafateQid prn, & Zofran prn; she remains asymptomatic w/o abd pain, n/v, c/d, blood seen...    UTIs> she reports no prob on Cranberry juice...    DJD, LBP, L1 compression w/ kyphoplasty, Osteop> on calium, MVI, VitD; we refilled Tramadol for prn use; she fell 2/14 w/ L1 compression & kyphoplasty in [place]; she needs f/u BMD.    Anxiety> she has a lot of family support, doesn't require meds.Marland KitchenMarland Kitchen  We reviewed prob list, meds, xrays and labs> see below for updates >>  CXR 3/14 showed normal heart size, atherosclerotic Ao, chr scarring from underlying sarcoid, NAD... LABS 5/14:  FLP- at goals on Simva40;  Chems- wnl;  CBC- wnl;  TSH=1.74;  VitD=52...  ~  June 03, 2013:  47mo ROV & Laura [name] has had some prob w/ pain & swelling in right ankle- she has appt w/ Ortho this afternoon (DrDaldorf)...     Hx prob Sarcoid and abn CXR; she remains asymptomatic & last film 3/14 showed stable scarring, NAD...     She remains on Simva40 for Chol; Labs 5/14 looked ok & we reviewed diet, exercise, & wt reduction strategies...    She has Hx GERD, IBS, colon polyps> on Nexium40Qam, Zantac300Qhs, Carafate 1gmQidPrn & doing fine w/o abd  apin, dysphagia, n/v, c/d, blood seen; last colon was 5/12...    She has hx DJD, LBP, L1 compression w/ kyphoplasty; on Tramadol prn, takes calcium, MVI, VitD; last BMD was done here 5/11 w/ TScores -1.2 in Spine and FemNecks... We reviewed prob list, meds, xrays and labs> see below for updates >> she had the 2014 flu vaccine in Oct...           Problem List:  Hx of UVEITIS (ICD-364.3) - eval at [facility] since 2001, given steroid injection in 2002... s/p right cataract surg 2/09 and no active uveitis seen... ~  4/11:  she tells me she was seen 10/10 & doing well, may need left cataract surg soon.  R/O SARCOIDOSIS (ICD-135) - Hx uveitis w/ eval at [facility], and subseq f/u CXR w/ CTChest showing bilat hilar adenopathy, and w/u showing neg PPD, ACE=53, sed=13... prev PFT's 1/08 showed FVC 3.75 (111%), FEV1 2.74 (102%), %1sec=73, mid-flows=70%... being followed w/ serial films and "watchful waiting"... there is a family hx of sarcoidosis. ~   we had a f/u CXR in Feb09- streaky opacity right mid lung zone, similar to 2008...  ~  CT Chest 7/08 & 6/09 showed mediastinal > hilar adenopathy some w/ calcif (generally smaller than 1/08), area of atx/ scarring right mid zone (infer RUL) & scat <1cm nodules in lower zones w/o change... ~  CXR 2/10 showed bilat hilar prominence & scarring appears the same- NAD... labs all WNL w/ ACE=56. ~  CXR 4/11 showed mild bilat hilar prominence, scarring, NAD- osteopenia & DJD in TSpine... ACE= 37. ~  CXR 4/12 showed stable bilat hilar prominence, scarring, NAD.[name]. ~  CXR 11/13 showed similar changes- irreg nodular changes in right suprahilar region, scarring, stable adenopathy, mild atherosclerotic calcif, osteopenia... ~  CXR 3/14 showed normal heart size, atherosclerotic Ao, chr scarring from underlying sarcoid, NAD...  MITRAL VALVE PROLAPSE (ICD-424.0) - on ASA 81mg /d... clinical dx w/ 2DEcho 1999 showing flat closure but no frank prolapse...  HYPERCHOLESTEROLEMIA  (ICD-272.0) - on SIMVASTATIN 40mg /d now... ~  FLP 1/08 on Lip10 showed TChol 183, TG 122, HDL 45, LDL 114...  ~  FLP 2/09 on Lip10 showed TChol 183, TG 189, HDL 39, LDL 107... continue diet & change to Simva40.  ~  FLP 5/09 on Simva40 showed TChol 185, TG 163, HDL 36, LDL 117... she wants to stay on Simva40. ~  FLP 2/10 showed TChol 176, Tg 160, HDL 39, LDL 105... rec> continue same. ~  FLP 4/11 showed TChol 160, TG 131, HDL 41, LDL 93 ~  FLP 4/12 showed TChol 174, TG 148, HDL 43, LDL 102 ~  FLP 5/13 showed TChol 159, TG 151, HDL  47, LDL 82 ~  FLP 5/14 showed TChol 165, TG 157, HDL 41, LDL 93  GERD (ICD-530.81) - on NEXIUM 40mg /d & ZANTAC 300mg Qhs... ~  EGD 6/07 w/ 3cmHH, stricture, gastitis (HPylori neg)... I offered to change her Nexium to a generic but she declined- "DrPatterson told me never to change this med"... she notes occas nocturnal reflux symptoms. ~  5/12: She had GI eval by DrPatterson 5/12> chr GERD, hx 3cmHH, prev stricture dilated, hx colon polyps, etc> c/o incr reflux symptoms, bloating, pressure despite her Nexium40AM & Zantac300PM;  Nexium was increased to Bid, Carafate added Qid & she had EGD 5/12 showing mod gastritis w/ bx= chr inflamm, neg HPylori... ~  CTAbd 5/12 showed mult parenchymal nodules at lung bases (some fractionally larger), calcif hilar nodes bilat, no renal lesions (?left upper pole mass seen on sonar?). ~  EGD 3/13 by DrPatterson showed 3cmHH, esophagitis, prob "pill espohagitis", dilated... Symptoms resolved w/ Carafate, Nexium, Zantac, Xylocaine... ~  5/14: on Nexium40Bid, Zantac300HS, CarafateQid prn, & Zofran prn; she remains asymptomatic w/o abd pain, n/v, c/d, blood seen...  IRRITABLE BOWEL SYNDROME (ICD-564.1) COLONIC POLYPS, HX OF (ICD-V12.72)  ~  Colonoscopy 6/07 by DrPatterson showing divertics & polyps (adenomatous)... f/u planned 42yrs... ~  She had f/u colonoscopy 5/12 showing mod divertics, sessile polyp in cecum= serrated adenoma w/ f/u planned  3-60yrs...  UTI'S, CHRONIC (ICD-599.0) - eval by DrOttelin for urology... now off the Macrodantin and using cranberry juice without recurrent UTI, no problems...  DEGENERATIVE JOINT DISEASE (ICD-715.90) - eval by DrDalldorf w/ mod DJD in knees & hx of torn left meniscus... 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 overall improved now. ~  11/13:  TRAMADOL 50mg  Tid refilled for prn use...  LOW BACK PAIN, CHRONIC (ICD-724.2) ~  2/14: she fell while getting up at night & hit her back w/ severe pain & L1 compression fx- adm to [facility] for 2d w/ kyphoplasty & good pain relief...  OSTEOPENIA (ICD-733.90) - on Calcium, MVI, Vit D... ~  BMD in 2005 showed TScore -1.2 in Spine, & -0.7 in [facility]. ~  BMD 5/09 showed TScores -1.5 in Spine & -1.1 in FemNecks. ~  BMD here 5/11 showed TScores -1.2 in Spine, and -1.2 in [facility]. ~  5/13 & 5/14:  She is due for f/u BMD but wants to wait for now...  ANXIETY (ICD-300.00)  Hx of BASAL CELL SKIN CANCER - she still sees her Derm in [place] yearly...  Health Maintenance: ~  GI:  followed by DrPatterson w/ colon as above... ~  GYN:  followed by DrRichardson & doing well by pt's report... [facility]... ~  Immunizations:  she gets yearly flu shots;  has PNEUMOVAX in 1998 & repeated 10/11 (age 60);  TDAP given 5/13;  we discussed Shingles vaccine (she had bout of shingles involving her right hip & leg ~age30) & she will decide.    Past Surgical History  Procedure Laterality Date  . Vesicovaginal fistula closure w/ tah    . Basal cell carcinoma excision    . Cataract extraction      right  . Removal of mass from rt antecubital fossa    . Abdominal hysterectomy    . Upper gastrointestinal endoscopy    . Colonoscopy      Outpatient Encounter Prescriptions as of 06/03/2013  Medication Sig  . aspirin 81 MG tablet Take 81  mg by mouth  daily.    . B Complex Vitamins (VITAMIN-B COMPLEX PO) Take by mouth daily.    . Calcium Carbonate (CALTRATE 600 PO) Take 1 tablet by mouth 2 (two) times daily.    [name] CARAFATE 1 GM/10ML suspension Take one teaspoonful (5ml) by mouth before meals and at bedtime  . Cholecalciferol (VITAMIN D) 1000 UNITS capsule Take 1,000 Units by mouth daily.    [name] esomeprazole (NEXIUM) 40 MG capsule Take 1 capsule (40 mg total) by mouth 2 (two) times daily.  . meclizine (ANTIVERT) 25 MG tablet 1/2-1 tablet by mouth every 4 hours as needed for dizziness  . Multiple Vitamins-Minerals (MULTIVITAL) tablet Take 1 tablet by mouth daily.    . Omega-3 Fatty Acids (FISH OIL) 1000 MG CAPS Take 1 capsule by mouth daily.    . ondansetron (ZOFRAN) 8 MG tablet 1 tablet by mouth every 6 hours as needed for nausea  . ranitidine (ZANTAC) 300 MG tablet Take 1 tablet (300 mg total) by mouth at bedtime.  . simvastatin (ZOCOR) 40 MG tablet Take 1 tablet (40 mg total) by mouth at bedtime.  . [DISCONTINUED] azithromycin (ZITHROMAX) 250 MG tablet Take as directed  . [DISCONTINUED] ciprofloxacin (CIPRO) 250 MG tablet Take 1 tablet (250 mg total) by mouth 2 (two) times daily.  . [DISCONTINUED] traMADol (ULTRAM) 50 MG tablet Take one tablet by mouth three times daily  as needed for pain    Allergies  Allergen Reactions  . Amoxicillin-Pot Clavulanate     REACTION: causes diarrhea  . Codeine     REACTION: nausea  . Levaquin [Levofloxacin In D5w]     Had itching w/ intravenous, not sure if reaction was from abx or the need to change the IV.  Does well when taken with Benadryl.  . Prednisone     REACTION: unable to take this med due to an eye condition  . Tdap [Diphth-Acell Pertussis-Tetanus]     Area raised, warm to touch, tender, swollen, pt felt jittery, nausea and some SOB    Review of Systems         See HPI - all other systems neg except as noted... The patient denies anorexia, fever, weight loss, weight gain, vision loss,  decreased hearing, hoarseness, chest pain, syncope, dyspnea on exertion, peripheral edema, prolonged cough, headaches, hemoptysis, abdominal pain, melena, hematochezia, severe indigestion/heartburn, hematuria, incontinence, muscle weakness, suspicious skin lesions, transient blindness, difficulty walking, depression, unusual weight change, abnormal bleeding, enlarged lymph nodes, and angioedema.     Objective:   Physical Exam    WD, WN, 72 y/o WF in NAD... GENERAL:  Alert & oriented; pleasant & cooperative... HEENT:  [place]/AT, EOM-wnl, PERRLA, EACs-clear, TMs-wnl, NOSE-clear, THROAT-clear & wnl. NECK:  Supple w/ fairROM; no JVD; normal carotid impulses w/o bruits; no thyromegaly or nodules palpated; no lymphadenopathy. CHEST:  Clear to P & A; without wheezes/ rales/ or rhonchi. HEART:  Regular Rhythm; without murmurs/ rubs/ or gallops. ABDOMEN:  Soft & nontender; normal bowel sounds; no organomegaly or masses detected. EXT: without deformities, mild arthritic changes; no varicose veins/ +venous insuffic/ no edema. NEURO:  CN's intact;  no focal neuro deficits... DERM:  No lesions noted; no rash etc...  RADIOLOGY DATA:  Reviewed in the EPIC EMR & discussed w/ the patient...  LABORATORY DATA:  Reviewed in the EPIC EMR & discussed w/ the patient...   Assessment & Plan:    Acute Bronchitis>  Doing satis w/o recent exacerbation...  SARCOID>  CXR stable & she remains  asymptomatic; note CTAbd findings at lung bases...  CHOL>  Stable on the Simva40 + diet...  GERD>  On Nexium & Zantac, she is currently improved from her prev symptoms...  DJD/ LBP/ Osteopenia>  As noted she remains stable on current meds & exercise program; she will see DrDaldorf about her ankle pain...  Compression fx L1 after fall> she is s/p L1 kyphoplasty...  Other medical issues as noted...   Patient's Medications  New Prescriptions   No medications on file  Previous Medications   ASPIRIN 81 MG TABLET    Take 81  mg by mouth daily.     B COMPLEX VITAMINS (VITAMIN-B COMPLEX PO)    Take by mouth daily.     CALCIUM CARBONATE (CALTRATE 600 PO)    Take 1 tablet by mouth 2 (two) times daily.     CHOLECALCIFEROL (VITAMIN D) 1000 UNITS CAPSULE    Take 1,000 Units by mouth daily.     ESOMEPRAZOLE (NEXIUM) 40 MG CAPSULE    Take 1 capsule (40 mg total) by mouth 2 (two) times daily.   MULTIPLE VITAMINS-MINERALS (MULTIVITAL) TABLET    Take 1 tablet by mouth daily.     OMEGA-3 FATTY ACIDS (FISH OIL) 1000 MG CAPS    Take 1 capsule by mouth daily.     RANITIDINE (ZANTAC) 300 MG TABLET    Take 1 tablet (300 mg total) by mouth at bedtime.   SIMVASTATIN (ZOCOR) 40 MG TABLET    Take 1 tablet (40 mg total) by mouth at bedtime.  Modified Medications   Modified Medication Previous Medication   MECLIZINE (ANTIVERT) 25 MG TABLET meclizine (ANTIVERT) 25 MG tablet      1/2-1 tablet by mouth every 4 hours as needed for dizziness    1/2-1 tablet by mouth every 4 hours as needed for dizziness   ONDANSETRON (ZOFRAN) 8 MG TABLET ondansetron (ZOFRAN) 8 MG tablet      1 tablet by mouth every 6 hours as needed for nausea    1 tablet by mouth every 6 hours as needed for nausea   SUCRALFATE (CARAFATE) 1 GM/10ML SUSPENSION CARAFATE 1 GM/10ML suspension      Take one teaspoonful (5ml) by mouth before meals and at bedtime    Take one teaspoonful (5ml) by mouth before meals and at bedtime  Discontinued Medications   AZITHROMYCIN (ZITHROMAX) 250 MG TABLET    Take as directed   CIPROFLOXACIN (CIPRO) 250 MG TABLET    Take 1 tablet (250 mg total) by mouth 2 (two) times daily.   TRAMADOL (ULTRAM) 50 MG TABLET    Take one tablet by mouth three times daily  as needed for pain

## 2013-08-19 ENCOUNTER — Encounter: Payer: Self-pay | Admitting: *Deleted

## 2013-08-20 ENCOUNTER — Ambulatory Visit (INDEPENDENT_AMBULATORY_CARE_PROVIDER_SITE_OTHER): Payer: Medicare Other | Admitting: Adult Health

## 2013-08-20 ENCOUNTER — Encounter: Payer: Self-pay | Admitting: Adult Health

## 2013-08-20 ENCOUNTER — Encounter (INDEPENDENT_AMBULATORY_CARE_PROVIDER_SITE_OTHER): Payer: Self-pay

## 2013-08-20 VITALS — BP 136/68 | HR 76 | Temp 98.5°F | Ht 68.0 in | Wt 179.0 lb

## 2013-08-20 DIAGNOSIS — R0609 Other forms of dyspnea: Secondary | ICD-10-CM

## 2013-08-20 DIAGNOSIS — R0600 Dyspnea, unspecified: Secondary | ICD-10-CM | POA: Insufficient documentation

## 2013-08-20 DIAGNOSIS — R0989 Other specified symptoms and signs involving the circulatory and respiratory systems: Secondary | ICD-10-CM

## 2013-08-20 NOTE — Progress Notes (Signed)
Subjective:    Patient ID: Laura [name], female    DOB: [birth year], 73 y.o.   MRN: [id number]  HPI 73 y/o WF with known hx of prob Sarcoidosis w/ hx Uveitis and BHA on CXR.   08/20/2013 Acute OV  Complains of DOE, increased fatigue x several months.  Would like to join [employer]. Started PT couple of months for fallen arch in right w/ leg weakness.  Walked a mile last week, did good on flat surface. Worried b/c when she walked up a steep hill that she got out of breath and  Had no energy. Has not exercised or walked in ~1 year.  Did not have any chest pain. Syncope, dizziness, jaw pain, orthopnea or edema.  After resting briefly she got energy level and breath back. And was able to walk rest of way up hill.  No FH of CAD.  She is a never smoker.  Is worried this is her heart , wants an EKG  No cough , rash , joint pain.  Had eye exam last week and reports no changes.  Ambulatory walk in office with no desats with O2 sats 97-99% .          Problem List:  Hx of UVEITIS (ICD-364.3) - eval at [facility] since 2001, given steroid injection in 2002... s/p right cataract surg 2/09 and no active uveitis seen... ~  4/11:  she tells me she was seen 10/10 & doing well, may need left cataract surg soon.  R/O SARCOIDOSIS (ICD-135) - Hx uveitis w/ eval at [facility], and subseq f/u CXR w/ CTChest showing bilat hilar adenopathy, and w/u showing neg PPD, ACE=53, sed=13... prev PFT's 1/08 showed FVC 3.75 (111%), FEV1 2.74 (102%), %1sec=73, mid-flows=70%... being followed w/ serial films and "watchful waiting"... there is a family hx of sarcoidosis. ~   we had a f/u CXR in Feb09- streaky opacity right mid lung zone, similar to 2008...  ~  CT Chest 7/08 & 6/09 showed mediastinal > hilar adenopathy some w/ calcif (generally smaller than 1/08), area of atx/ scarring right mid zone (infer RUL) & scat <1cm nodules in lower zones w/o change... ~  CXR 2/10 showed bilat hilar prominence & scarring appears the same-  NAD... labs all WNL w/ ACE=56. ~  CXR 4/11 showed mild bilat hilar prominence, scarring, NAD- osteopenia & DJD in TSpine... ACE= 37. ~  CXR 4/12 showed stable bilat hilar prominence, scarring, NAD.[name]. ~  CXR 11/13 showed similar changes- irreg nodular changes in right suprahilar region, scarring, stable adenopathy, mild atherosclerotic calcif, osteopenia... ~  CXR 3/14 showed normal heart size, atherosclerotic Ao, chr scarring from underlying sarcoid, NAD...  MITRAL VALVE PROLAPSE (ICD-424.0) - on ASA 81mg /d... clinical dx w/ 2DEcho 1999 showing flat closure but no frank prolapse...  HYPERCHOLESTEROLEMIA (ICD-272.0) - on SIMVASTATIN 40mg /d now... ~  Cochran 1/08 on Lip10 showed TChol 183, TG 122, HDL 45, LDL 114...  ~  Greer 2/09 on Lip10 showed TChol 183, TG 189, HDL 39, LDL 107... continue diet & change to Simva40.  ~  FLP 5/09 on Simva40 showed TChol 185, TG 163, HDL 36, LDL 117... she wants to stay on Simva40. ~  FLP 2/10 showed TChol 176, Tg 160, HDL 39, LDL 105... rec> continue same. ~  FLP 4/11 showed TChol 160, TG 131, HDL 41, LDL 93 ~  FLP 4/12 showed TChol 174, TG 148, HDL 43, LDL 102 ~  FLP 5/13 showed TChol 159, TG 151, HDL 47, LDL 82 ~  FLP 5/14 showed TChol 165, TG 157, HDL 41, LDL 93  GERD (ICD-530.81) - on NEXIUM 40mg /d & ZANTAC 300mg Qhs... ~  EGD 6/07 w/ 3cmHH, stricture, gastitis (HPylori neg)... I offered to change her Nexium to a generic but she declined- "DrPatterson told me never to change this med"... she notes occas nocturnal reflux symptoms. ~  5/12: She had GI eval by DrPatterson 5/12> chr GERD, hx 3cmHH, prev stricture dilated, hx colon polyps, etc> c/o incr reflux symptoms, bloating, pressure despite her Nexium40AM & Zantac300PM;  Nexium was increased to Bid, Carafate added Qid & she had EGD 5/12 showing mod gastritis w/ bx= chr inflamm, neg HPylori... ~  CTAbd 5/12 showed mult parenchymal nodules at lung bases (some fractionally larger), calcif hilar nodes bilat, no renal  lesions (?left upper pole mass seen on sonar?). ~  EGD 3/13 by DrPatterson showed 3cmHH, esophagitis, prob "pill espohagitis", dilated... Symptoms resolved w/ Carafate, Nexium, Zantac, Xylocaine... ~  5/14: on Nexium40Bid, Zantac300HS, CarafateQid prn, & Zofran prn; she remains asymptomatic w/o abd pain, n/v, c/d, blood seen...  IRRITABLE BOWEL SYNDROME (ICD-564.1) COLONIC POLYPS, HX OF (ICD-V12.72)  ~  Colonoscopy 6/07 by DrPatterson showing divertics & polyps (adenomatous)... f/u planned 47yrs... ~  She had f/u colonoscopy 5/12 showing mod divertics, sessile polyp in cecum= serrated adenoma w/ f/u planned 3-41yrs...  UTI'S, CHRONIC (ICD-599.0) - eval by DrOttelin for urology... now off the Macrodantin and using cranberry juice without recurrent UTI, no problems...  DEGENERATIVE JOINT DISEASE (ICD-715.90) - eval by DrDalldorf w/ mod DJD in knees & hx of torn left meniscus... 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 overall improved now. ~  11/13:  TRAMADOL 50mg  Tid refilled for prn use...  LOW BACK PAIN, CHRONIC (ICD-724.2) ~  2/14: she fell while getting up at night & hit her back w/ severe pain & L1 compression fx- adm to [facility] for 2d w/ kyphoplasty & good pain relief...  OSTEOPENIA (ICD-733.90) - on Calcium, MVI, Vit D... ~  BMD in 2005 showed TScore -1.2 in Spine, & -0.7 in [facility]. ~  BMD 5/09 showed TScores -1.5 in Spine & -1.1 in [facility]. ~  BMD here 5/11 showed TScores -1.2 in Spine, and -1.2 in [facility]. ~  5/13 & 5/14:  She is due for f/u BMD but wants to wait for now...  ANXIETY (ICD-300.00)  Hx of BASAL CELL SKIN CANCER - she still sees her Derm in [place] yearly...  Health Maintenance: ~  GI:  followed by DrPatterson w/ colon as above... ~  GYN:  followed by DrRichardson & doing well by pt's report... Mammograms at the [facility]... ~  Immunizations:  she gets  yearly flu shots;  has PNEUMOVAX in 1998 & repeated 10/11 (age 4);  TDAP given 5/13;  we discussed Shingles vaccine (she had bout of shingles involving her right hip & leg ~age30) & she will decide.    Past Surgical History  Procedure Laterality Date  . Vesicovaginal fistula closure w/ tah    . Basal cell carcinoma excision    . Cataract extraction      right  . Removal of mass from rt antecubital fossa    . Abdominal hysterectomy    . Upper gastrointestinal endoscopy    . Colonoscopy  2012    Outpatient Encounter Prescriptions as of 08/20/2013  Medication Sig  . aspirin 81 MG tablet Take 81 mg by mouth daily.    [name]  B Complex Vitamins (VITAMIN-B COMPLEX PO) Take by mouth daily.    . Calcium Carbonate (CALTRATE 600 PO) Take 1 tablet by mouth 2 (two) times daily.    . Cholecalciferol (VITAMIN D) 1000 UNITS capsule Take 1,000 Units by mouth daily.    [name] esomeprazole (NEXIUM) 40 MG capsule Take 40 mg by mouth daily before breakfast.  . meclizine (ANTIVERT) 25 MG tablet 1/2-1 tablet by mouth every 4 hours as needed for dizziness  . Multiple Vitamins-Minerals (MULTIVITAL) tablet Take 1 tablet by mouth daily.    . Omega-3 Fatty Acids (FISH OIL) 1000 MG CAPS Take 1 capsule by mouth daily.    . ondansetron (ZOFRAN) 8 MG tablet 1 tablet by mouth every 6 hours as needed for nausea  . ranitidine (ZANTAC) 300 MG tablet Take 1 tablet (300 mg total) by mouth at bedtime.  . simvastatin (ZOCOR) 40 MG tablet Take 1 tablet (40 mg total) by mouth at bedtime.  . sucralfate (CARAFATE) 1 GM/10ML suspension Take one teaspoonful (42ml) by mouth before meals and at bedtime  . [DISCONTINUED] esomeprazole (NEXIUM) 40 MG capsule Take 1 capsule (40 mg total) by mouth 2 (two) times daily.    Allergies  Allergen Reactions  . Amoxicillin-Pot Clavulanate     REACTION: causes diarrhea  . Codeine     REACTION: nausea  . Levaquin [Levofloxacin In D5w]     Had itching w/ intravenous, not sure if reaction was from abx  or the need to change the IV.  Does well when taken with Benadryl.  . Prednisone     REACTION: unable to take this med due to an eye condition  . Tdap [Diphth-Acell Pertussis-Tetanus]     Area raised, warm to touch, tender, swollen, pt felt jittery, nausea and some SOB    Review of Systems         See HPI - all other systems neg except as noted... The patient denies anorexia, fever, weight loss, weight gain, vision loss, decreased hearing, hoarseness, chest pain, syncope, dyspnea on exertion, peripheral edema, prolonged cough, headaches, hemoptysis, abdominal pain, melena, hematochezia, severe indigestion/heartburn, hematuria, incontinence, muscle weakness, suspicious skin lesions, transient blindness, difficulty walking, depression, unusual weight change, abnormal bleeding, enlarged lymph nodes, and angioedema.     Objective:   Physical Exam    WD, WN, 73 y/o WF in NAD... GENERAL:  Alert & oriented; pleasant & cooperative... HEENT:  [place]/AT, EOM-wnl, PERRLA, EACs-clear, TMs-wnl, NOSE-clear, THROAT-clear & wnl. NECK:  Supple w/ fairROM; no JVD; normal carotid impulses w/o bruits; no thyromegaly or nodules palpated; no lymphadenopathy. CHEST:  Clear to P & A; without wheezes/ rales/ or rhonchi. HEART:  Regular Rhythm; without murmurs/ rubs/ or gallops. ABDOMEN:  Soft & nontender; normal bowel sounds; no organomegaly or masses detected. EXT: without deformities, mild arthritic changes; no varicose veins/ +venous insuffic/ no edema. NEURO:  CN's intact;  no focal neuro deficits... DERM:  No lesions noted; no rash etc...  08/20/2013 EKG(poor EKG w/ wandering baseline)  : NSR w/ no acute findings

## 2013-08-20 NOTE — Assessment & Plan Note (Addendum)
DOE ? Etiology  Suspect this is a component of deconditioning  Exam is unrevealing. , no desats w/ ambulation in office.  Will check labs and cxr today .  EKG without acute changes noted, pt is very concerned that this could be cardiac in nature. Will refer to cards for eval Do not start silver sneakers until cards eval.  Went over warning signs of Heart attack , advised if develops any to contact 911/ER immediately.    Plan  cxr Reva Bores  Refer to cards

## 2013-08-20 NOTE — Patient Instructions (Signed)
We will refer you to cardiology as discussed  Labs and chest xray today.  Follow up as planned  Please contact office for sooner follow up if symptoms do not improve or worsen or seek emergency care

## 2013-08-21 ENCOUNTER — Encounter: Payer: Self-pay | Admitting: Gastroenterology

## 2013-08-21 ENCOUNTER — Ambulatory Visit (INDEPENDENT_AMBULATORY_CARE_PROVIDER_SITE_OTHER): Payer: Medicare Other | Admitting: Gastroenterology

## 2013-08-21 ENCOUNTER — Other Ambulatory Visit (INDEPENDENT_AMBULATORY_CARE_PROVIDER_SITE_OTHER): Payer: Medicare Other

## 2013-08-21 ENCOUNTER — Ambulatory Visit (INDEPENDENT_AMBULATORY_CARE_PROVIDER_SITE_OTHER)
Admission: RE | Admit: 2013-08-21 | Discharge: 2013-08-21 | Disposition: A | Discharge status: Home / Self Care | Payer: Medicare Other | Source: Ambulatory Visit | Attending: Adult Health | Admitting: Adult Health

## 2013-08-21 VITALS — BP 122/78 | HR 78 | Ht 68.0 in | Wt 176.0 lb

## 2013-08-21 DIAGNOSIS — K219 Gastro-esophageal reflux disease without esophagitis: Secondary | ICD-10-CM

## 2013-08-21 DIAGNOSIS — R0989 Other specified symptoms and signs involving the circulatory and respiratory systems: Secondary | ICD-10-CM

## 2013-08-21 DIAGNOSIS — R0609 Other forms of dyspnea: Secondary | ICD-10-CM

## 2013-08-21 DIAGNOSIS — R0600 Dyspnea, unspecified: Secondary | ICD-10-CM

## 2013-08-21 DIAGNOSIS — M549 Dorsalgia, unspecified: Secondary | ICD-10-CM

## 2013-08-21 LAB — CBC WITH DIFFERENTIAL/PLATELET
BASOS PCT: 0.4 % (ref 0.0–3.0)
Basophils Absolute: 0 10*3/uL (ref 0.0–0.1)
EOS ABS: 0.2 10*3/uL (ref 0.0–0.7)
Eosinophils Relative: 2.8 % (ref 0.0–5.0)
HEMATOCRIT: 46.9 % — AB (ref 36.0–46.0)
HEMOGLOBIN: 15.4 g/dL — AB (ref 12.0–15.0)
Lymphocytes Relative: 26.1 % (ref 12.0–46.0)
Lymphs Abs: 1.6 10*3/uL (ref 0.7–4.0)
MCHC: 32.8 g/dL (ref 30.0–36.0)
MCV: 92.8 fl (ref 78.0–100.0)
MONO ABS: 0.5 10*3/uL (ref 0.1–1.0)
Monocytes Relative: 7.3 % (ref 3.0–12.0)
NEUTROS ABS: 3.9 10*3/uL (ref 1.4–7.7)
Neutrophils Relative %: 63.4 % (ref 43.0–77.0)
Platelets: 260 10*3/uL (ref 150.0–400.0)
RBC: 5.05 Mil/uL (ref 3.87–5.11)
RDW: 13.4 % (ref 11.5–14.6)
WBC: 6.2 10*3/uL (ref 4.5–10.5)

## 2013-08-21 LAB — BASIC METABOLIC PANEL
BUN: 13 mg/dL (ref 6–23)
CO2: 31 mEq/L (ref 19–32)
CREATININE: 0.7 mg/dL (ref 0.4–1.2)
Calcium: 9.4 mg/dL (ref 8.4–10.5)
Chloride: 104 mEq/L (ref 96–112)
GFR: 88.63 mL/min (ref >60.00)
GLUCOSE: 94 mg/dL (ref 70–99)
Potassium: 3.4 mEq/L — ABNORMAL LOW (ref 3.5–5.1)
Sodium: 140 mEq/L (ref 135–145)

## 2013-08-21 LAB — TSH: TSH: 2.28 u[IU]/mL (ref 0.35–5.50)

## 2013-08-21 MED ORDER — SUCRALFATE 1 G PO TABS: 1.0000 g | ORAL_TABLET | Freq: Three times a day (TID) | ORAL | Status: DC

## 2013-08-21 MED ORDER — SUCRALFATE 1 GM/10ML PO SUSP: ORAL | Status: DC

## 2013-08-21 MED ORDER — ESOMEPRAZOLE MAGNESIUM 40 MG PO CPDR: 40.0000 mg | DELAYED_RELEASE_CAPSULE | Freq: Two times a day (BID) | ORAL | Status: DC

## 2013-08-21 NOTE — Progress Notes (Signed)
This is a 73 year old Caucasian female accompanied by her daughter.  She has chronic GERD and had some breakthrough symptoms at nighttime despite Nexium 40 mg a morning, ranitidine with supper and Carafate before bedtime.  Her pain is located mostly in the right posterior chest area was not associated with true reflux, dysphagia, or any hepatobiliary symptoms.  She's had lung problems for many years followed by Dr. Lenna Gilford.  In the past she has had pill-induced esophagitis.  Axid she is doing fairly well in terms of her chronic daily GERD complaints.  She denies any hepatobiliary or lower gastrointestinal cardiovascular or pulmonary issues.  Apparently workup has been negative for sarcoidosis.  Irritable bowel syndrome seems to be chronically doing fairly well.  Current Medications, Allergies, Past Medical History, Past Surgical History, Family History and Social History were reviewed in [employer] record.  ROS: All systems were reviewed and are negative unless otherwise stated in the HPI.          Physical Exam: Healthy-appearing patient in no distress.  Blood pressure 122/78, pulse 70 and regular, and weight 176 with a BMI of 26.77.  He has never smoked.  I cannot appreciate stigmata of chronic liver disease.  Her chest is generally clear and she is in a regular rhythm without murmurs gallops or rubs.  Is no hepatosplenomegaly, abdominal masses or tenderness.  Bowel sounds are normal.  Mental status is normal.  Examination the back is unremarkable.  There is no CVA tenderness.    Assessment and Plan: Some breakthrough GERD symptoms at night  In a77 year old patient with chronic GERD for many many years  withrepeat exams for H. pylori been negative.  She has not been felt to be a good candidate for fundal plication surgery.  There is no history of colon cancer and is up-to-date on her colonoscopy exams.  Previous ultrasound exams are been unremarkable.  Her daughter was present  throughout the interview and exam today.  I have changed her to Nexium 40 mg 30 minutes before breakfast and supper with Carafate liquid as needed at nighttime with standard antireflux maneuvers.  She will see Dr. Hilarie Fredrickson in  several weeks if she is still symptomatic, need repeat endoscopy which has not been done in several years.  Also before any consideration for fundoplication surgery was entertained, she would need high-resolution esophageal manometry.  Standard  issues for reflux maneuvers reviewed with patient and her daughter.

## 2013-08-21 NOTE — Patient Instructions (Signed)
Please follow up with Dr. Hilarie Fredrickson in one month

## 2013-09-15 ENCOUNTER — Ambulatory Visit: Payer: Medicare Other | Admitting: Internal Medicine

## 2013-09-21 ENCOUNTER — Encounter: Payer: Self-pay | Admitting: Internal Medicine

## 2013-09-22 ENCOUNTER — Ambulatory Visit (INDEPENDENT_AMBULATORY_CARE_PROVIDER_SITE_OTHER): Payer: Medicare Other | Admitting: Internal Medicine

## 2013-09-22 ENCOUNTER — Encounter: Payer: Self-pay | Admitting: Internal Medicine

## 2013-09-22 ENCOUNTER — Other Ambulatory Visit (INDEPENDENT_AMBULATORY_CARE_PROVIDER_SITE_OTHER): Payer: Medicare Other

## 2013-09-22 VITALS — BP 142/64 | HR 70 | Ht 68.0 in | Wt 176.0 lb

## 2013-09-22 DIAGNOSIS — R1013 Epigastric pain: Secondary | ICD-10-CM

## 2013-09-22 DIAGNOSIS — K219 Gastro-esophageal reflux disease without esophagitis: Secondary | ICD-10-CM

## 2013-09-22 DIAGNOSIS — Z86010 Personal history of colonic polyps: Secondary | ICD-10-CM

## 2013-09-22 LAB — MAGNESIUM: MAGNESIUM: 2 mg/dL (ref 1.5–2.5)

## 2013-09-22 MED ORDER — ESOMEPRAZOLE MAGNESIUM 40 MG PO CPDR: 40.0000 mg | DELAYED_RELEASE_CAPSULE | Freq: Two times a day (BID) | ORAL | Status: DC

## 2013-09-22 MED ORDER — SUCRALFATE 1 G PO TABS: 1.0000 g | ORAL_TABLET | ORAL | Status: DC | PRN

## 2013-09-22 NOTE — Patient Instructions (Signed)
Your physician has requested that you go to the basement for the following lab work before leaving today: Magnesium  We have sent the following medications to your pharmacy for you to pick up at your convenience: Nexium twice a day, carafate as needed

## 2013-09-22 NOTE — Progress Notes (Signed)
Patient ID: Laura [name], female   DOB: 04/14/1941, 73 y.o.   MRN: [id number] HPI: Laura [name] is a 73 yo female previously followed for years by Dr. Sharlett Iles with a past medical history of sarcoidosis, GERD, IBS, adenomatous colon polyps, DJD, gastritis, hyperlipidemia who is seen in followup. She was last seen by Dr. Sharlett Iles about one month ago.  She is here today with her daughter. At the time of her last visit with Dr. Sharlett Iles she was having breakthrough nocturnal epigastric abdominal pain. He increased her Nexium from 40 mg once to twice daily and added liquid Carafate. She reports that this has significantly helped her discomfort. She describes that her discomfort usually occurs at night and can wake her up from sleep. It starts as a mid back pain which radiates through to her epigastrium. Since been occurring on and off for over 10 years. It seems to be worse when she rolls and lies on her right side. He can occur during the daytime but is infrequent. Belching often relieves the pain. She has been using liquid Carafate in the middle the night as this pain occurs and it helps. She is also taking 1 g Carafate tablet before bedtime. She denies daytime heartburn. No dysphagia or odynophagia. Good appetite. Stable weight. Normal bowel habits. No blood her stool or melena.  Past Medical History  Diagnosis Date  . Uveitis   . Sarcoidosis   . MVP (mitral valve prolapse)   . Hypercholesterolemia   . GERD (gastroesophageal reflux disease)   . IBS (irritable bowel syndrome)   . Personal history of colonic polyps     SESSILE SERRATED ADENOMAS (X2)  . Urinary tract infection, site not specified   . Osteopenia   . Anxiety   . DJD (degenerative joint disease)   . Diverticulosis of colon (without mention of hemorrhage)   . Hiatal hernia   . Stricture and stenosis of esophagus   . Unspecified gastritis and gastroduodenitis without mention of hemorrhage   . Allergy     SEASONAL    Past Surgical  History  Procedure Laterality Date  . Vesicovaginal fistula closure w/ tah    . Basal cell carcinoma excision    . Cataract extraction      right  . Removal of mass from rt antecubital fossa    . Abdominal hysterectomy    . Upper gastrointestinal endoscopy    . Colonoscopy  2012    Current Outpatient Prescriptions  Medication Sig Dispense Refill  . aspirin 81 MG tablet Take 81 mg by mouth daily.        . B Complex Vitamins (VITAMIN-B COMPLEX PO) Take by mouth daily.        . Calcium Carbonate (CALTRATE 600 PO) Take 1 tablet by mouth 2 (two) times daily.        . Cholecalciferol (VITAMIN D) 1000 UNITS capsule Take 1,000 Units by mouth daily.        [name] esomeprazole (NEXIUM) 40 MG capsule Take 1 capsule (40 mg total) by mouth 2 (two) times daily before a meal.  60 capsule  6  . meclizine (ANTIVERT) 25 MG tablet 1/2-1 tablet by mouth every 4 hours as needed for dizziness  50 tablet  11  . Multiple Vitamins-Minerals (MULTIVITAL) tablet Take 1 tablet by mouth daily.        . Omega-3 Fatty Acids (FISH OIL) 1000 MG CAPS Take 1 capsule by mouth daily.        . ondansetron (ZOFRAN) 8  MG tablet 1 tablet by mouth every 6 hours as needed for nausea  25 tablet  5  . simvastatin (ZOCOR) 40 MG tablet Take 1 tablet (40 mg total) by mouth at bedtime.  90 tablet  3  . sucralfate (CARAFATE) 1 G tablet Take 1 tablet (1 g total) by mouth as needed.  30 tablet  6   No current facility-administered medications for this visit.    Allergies  Allergen Reactions  . Amoxicillin-Pot Clavulanate     REACTION: causes diarrhea  . Codeine     REACTION: nausea  . Levaquin [Levofloxacin In D5w]     Had itching w/ intravenous, not sure if reaction was from abx or the need to change the IV.  Does well when taken with Benadryl.  . Prednisone     REACTION: unable to take this med due to an eye condition  . Tdap [Diphth-Acell Pertussis-Tetanus]     Area raised, warm to touch, tender, swollen, pt felt jittery, nausea  and some SOB    Family History  Problem Relation Age of Onset  . Sarcoidosis Sister   . Sarcoidosis Brother   . Other      nephew with Wegener's   . Cancer Mother     ? type  . Colon cancer Sister   . Stroke Sister     History  Substance Use Topics  . Smoking status: Never Smoker   . Smokeless tobacco: Never Used  . Alcohol Use: 0.6 oz/week    1 Glasses of wine per week     Comment: occ wine     ROS: As per history of present illness, otherwise negative  BP 142/64  Pulse 70  Ht 5\' 8"  (1.727 m)  Wt 176 lb (79.833 kg)  BMI 26.77 kg/m2 Constitutional: Well-developed and well-nourished. No distress. HEENT: Normocephalic and atraumatic. Oropharynx is clear and moist. No oropharyngeal exudate. Conjunctivae are normal.  No scleral icterus. Neck: Neck supple. Trachea midline. Cardiovascular: Normal rate, regular rhythm and intact distal pulses.  Pulmonary/chest: Effort normal and breath sounds normal. No wheezing, rales or rhonchi. Abdominal: Soft, nontender, nondistended. Bowel sounds active throughout.  Extremities: no clubbing, cyanosis, or edema Lymphadenopathy: No cervical adenopathy noted. Neurological: Alert and oriented to person place and time. Skin: Skin is warm and dry. No rashes noted. Psychiatric: Normal mood and affect. Behavior is normal.  RELEVANT LABS AND IMAGING: CBC    Component Value Date/Time   WBC 6.2 08/21/2013 1001   RBC 5.05 08/21/2013 1001   HGB 15.4* 08/21/2013 1001   HCT 46.9* 08/21/2013 1001   PLT 260.0 08/21/2013 1001   MCV 92.8 08/21/2013 1001   MCHC 32.8 08/21/2013 1001   RDW 13.4 08/21/2013 1001   LYMPHSABS 1.6 08/21/2013 1001   MONOABS 0.5 08/21/2013 1001   EOSABS 0.2 08/21/2013 1001   BASOSABS 0.0 08/21/2013 1001    CMP     Component Value Date/Time   NA 140 08/21/2013 1001   K 3.4* 08/21/2013 1001   CL 104 08/21/2013 1001   CO2 31 08/21/2013 1001   GLUCOSE 94 08/21/2013 1001   GLUCOSE 105* 08/01/2006 0943   BUN 13 08/21/2013 1001    CREATININE 0.7 08/21/2013 1001   CALCIUM 9.4 08/21/2013 1001   PROT 7.8 12/02/2012 1134   ALBUMIN 4.0 12/02/2012 1134   AST 19 12/02/2012 1134   ALT 18 12/02/2012 1134   ALKPHOS 80 12/02/2012 1134   BILITOT 0.8 12/02/2012 1134   GFRNONAA 88.11 11/16/2009 1045   GFRAA  92 09/08/2008 0000   CT ABDOMEN WITHOUT AND WITH CONTRAST -- May 2012   Technique:  Multidetector CT imaging of the abdomen was performed following the standard protocol before and during bolus administration of intravenous contrast.   Contrast: 100 ml Omnipaque-300   Comparison: Ultrasound exam from 12/08/2010.  CT chest from 12/30/2007.   Findings: The imaging through the lung bases shows multiple the pulmonary parenchymal nodules in both lower lobes.  Calcified nodal tissue is seen in the inferior hilar regions bilaterally.  There also some calcified lymph nodes around the distal esophagus.  Many of the pulmonary parenchymal nodules are stable however a 5 x 7 mm nodule measured on the exam from 3 years ago now measures 13 x 9 mm.  This is in the posteromedial right lower lobe, adjacent to the spine.  Just cranial to that lesion is a 1.7 x 0.8 cm nodule that measured 0.8 x 0.5 cm previously.   Precontrast imaging through the abdomen shows no calcification in the kidney.   Postcontrast imaging through the abdomen shows no lesion in the left kidney.  I have directly reviewed the ultrasound images from 12/08/2010 and the small hypoechoic focus seen in the upper pole of the left kidney on that ultrasound exam has no underlying correlate on today's CT study.  The findings on ultrasound are presumably related to a slight dromedary hump in the same region as a prominent renal pyramid.  The right kidney is also normal.   No focal abnormalities seen in the liver or spleen.  The stomach is normal.  Duodenal diverticulum noted.  Pancreas, gallbladder, and adrenal glands have normal imaging features.   No evidence for free  fluid in the abdomen.  Borderline lymphadenopathy is seen in the gastrohepatic ligament.  No abdominal aortic aneurysm.   IMPRESSION: There is no focal lesion in the left kidney.   Changes in the lung bases are compatible with a reported history of sarcoidosis.  Two nodules in the posteromedial right lower lobe has progressed since the chest CT from 12/30/2007.  This progression may simply reflect enlarging/evolving scar secondary to sarcoidosis.  Consider repeat CT chest to assess overall disease progression.  Technically, neoplastic enlargement of these nodules cannot be completely excluded by imaging.   EGD/colon -- Sharlett Iles - reviewed ASSESSMENT/PLAN:  73 yo female previously followed for years by Dr. Sharlett Iles with a past medical history of sarcoidosis, GERD, IBS, adenomatous colon polyps, DJD, gastritis, hyperlipidemia who is seen in followup.  1.  GERD/epigastric pain (intermittent) --  her epigastric abdominal pain has improved with twice daily Nexium. She is also getting favorable response to liquid Carafate when necessary. We discussed the differential which includes esophageal spasm but also possibly biliary dyskinesia. Given her overall improvement we will not pursue further imaging with either HIDA scan or manometry at this time. She is in agreement. With the chronic PPI recommend checking a magnesium level today that she stay current on her bone mineral density testing, calcium and vitamin D levels.  She voices understanding. I will see her back in 2 months to ensure symptoms remain well controlled.  2.  History of adenomatous colon polyps -- 2012 complete colonoscopy, 2 less than 1 cm adenomatous. Based on current guidelines recommendation would be repeat colonoscopy in 2017

## 2013-09-24 ENCOUNTER — Ambulatory Visit (INDEPENDENT_AMBULATORY_CARE_PROVIDER_SITE_OTHER): Payer: Medicare Other | Admitting: Internal Medicine

## 2013-09-24 ENCOUNTER — Encounter: Payer: Self-pay | Admitting: Internal Medicine

## 2013-09-24 VITALS — BP 134/74 | HR 79 | Ht 68.0 in | Wt 176.0 lb

## 2013-09-24 DIAGNOSIS — R0602 Shortness of breath: Secondary | ICD-10-CM

## 2013-09-24 NOTE — Progress Notes (Signed)
HPI Laura [name] is referred today for evaluation of sob. She is a pleasant 73 yo woman with a h/o a spinal fracture nearly a year ago. She has been sedentary. She eventually improved and went back out to walk with her daughter and had extreme difficulty walking up a hill, having to stop and rest and catch her breath. She did not have chest pain. No nausea, vomiting or peripheral edema.  Allergies  Allergen Reactions  . Prednisone     REACTION: unable to take this med due to an eye condition  . Amoxicillin-Pot Clavulanate     REACTION: causes diarrhea  . Codeine     REACTION: nausea  . Levaquin [Levofloxacin In D5w]     Had itching w/ intravenous, not sure if reaction was from abx or the need to change the IV.  Does well when taken with Benadryl.  . Tdap [Diphth-Acell Pertussis-Tetanus]     Area raised, warm to touch, tender, swollen, pt felt jittery, nausea and some SOB     Current Outpatient Prescriptions  Medication Sig Dispense Refill  . aspirin 81 MG tablet Take 81 mg by mouth daily.        . B Complex Vitamins (VITAMIN-B COMPLEX PO) Take 1 capsule by mouth daily.       . Calcium Carbonate (CALTRATE 600 PO) Take 1 tablet by mouth 2 (two) times daily.        . Cholecalciferol (VITAMIN D) 1000 UNITS capsule Take 1,000 Units by mouth daily.        [name] esomeprazole (NEXIUM) 40 MG capsule Take 1 capsule (40 mg total) by mouth 2 (two) times daily before a meal.  60 capsule  6  . Glucosamine HCl (GLUCOSAMINE PO) Liquid form - Take once daily      . meclizine (ANTIVERT) 25 MG tablet Take 1/2-1 tablet by mouth every 4 hours as needed for dizziness      . Multiple Vitamins-Minerals (MULTIVITAL) tablet Take 1 tablet by mouth daily.        . Omega-3 Fatty Acids (FISH OIL) 1000 MG CAPS Take 1 capsule by mouth daily.        . ondansetron (ZOFRAN) 8 MG tablet 1 tablet by mouth every 6 hours as needed for nausea  25 tablet  5  . simvastatin (ZOCOR) 40 MG tablet Take 1 tablet (40 mg total) by  mouth at bedtime.  90 tablet  3  . sucralfate (CARAFATE) 1 G tablet Take 1 tablet (1 g total) by mouth as needed.  30 tablet  6   No current facility-administered medications for this visit.     Past Medical History  Diagnosis Date  . Uveitis   . Sarcoidosis   . MVP (mitral valve prolapse)   . Hypercholesterolemia   . GERD (gastroesophageal reflux disease)   . IBS (irritable bowel syndrome)   . Personal history of colonic polyps     SESSILE SERRATED ADENOMAS (X2)  . Urinary tract infection, site not specified   . Osteopenia   . Anxiety   . DJD (degenerative joint disease)   . Diverticulosis of colon (without mention of hemorrhage)   . Hiatal hernia   . Stricture and stenosis of esophagus   . Unspecified gastritis and gastroduodenitis without mention of hemorrhage   . Allergy     SEASONAL    ROS:   All systems reviewed and negative except as noted in the HPI.   Past Surgical History  Procedure Laterality  Date  . Vesicovaginal fistula closure w/ tah    . Basal cell carcinoma excision    . Cataract extraction      right  . Removal of mass from rt antecubital fossa    . Abdominal hysterectomy    . Upper gastrointestinal endoscopy    . Colonoscopy  2012     Family History  Problem Relation Age of Onset  . Sarcoidosis Sister   . Sarcoidosis Brother   . Other      nephew with Wegener's   . Cancer Mother     ? type  . Colon cancer Sister   . Stroke Sister      History   Social History  . Marital Status: Married    Spouse Name: [name]    Number of Children: 2  . Years of Education: N/A   Occupational History  . Retired     & Part Time Artist   Social History Main Topics  . Smoking status: Never Smoker   . Smokeless tobacco: Never Used  . Alcohol Use: 0.6 oz/week    1 Glasses of wine per week     Comment: occ wine   . Drug Use: No  . Sexual Activity: Not on file   Other Topics Concern  . Not on file   Social History Narrative    . No narrative on file     BP 134/74  Pulse 79  Ht 5\' 8"  (1.727 m)  Wt 176 lb (79.833 kg)  BMI 26.77 kg/m2  Physical Exam:  Well appearing 73 yo woman, NAD HEENT: Unremarkable Neck:  No JVD, no thyromegally Back:  No CVA tenderness Lungs:  Clear with no wheezes, rales, or rhonchi HEART:  Regular rate rhythm, no murmurs, no rubs, no clicks Abd:  soft, positive bowel sounds, no organomegally, no rebound, no guarding Ext:  2 plus pulses, no edema, no cyanosis, no clubbing Skin:  No rashes no nodules Neuro:  CN II through XII intact, motor grossly intact  EKG - NSR   Assess/Plan:

## 2013-09-24 NOTE — Assessment & Plan Note (Signed)
The etiology of her symptoms is unclear. I have asked that she undergo 2D echo, and exercise treadmill stress testing. If both are negative, then PFT's would be indicated. The real question is related to her sarcoid. Does she have cardiac involvement?

## 2013-09-24 NOTE — Patient Instructions (Signed)
Your physician recommends that you schedule a follow-up appointment in: 5 weeks with Dr Lovena Le  Your physician has requested that you have an echocardiogram. Echocardiography is a painless test that uses sound waves to create images of your heart. It provides your doctor with information about the size and shape of your heart and how well your heart's chambers and valves are working. This procedure takes approximately one hour. There are no restrictions for this procedure.  Your physician has requested that you have an exercise tolerance test. For further information please visit [email or web address]. Please also follow instruction sheet, as given.

## 2013-10-01 ENCOUNTER — Telehealth (HOSPITAL_COMMUNITY): Payer: Self-pay

## 2013-10-01 ENCOUNTER — Ambulatory Visit (HOSPITAL_COMMUNITY)
Admission: RE | Admit: 2013-10-01 | Discharge: 2013-10-01 | Disposition: A | Discharge status: Home / Self Care | Payer: Medicare Other | Source: Ambulatory Visit | Attending: Cardiovascular Disease | Admitting: Cardiovascular Disease

## 2013-10-01 DIAGNOSIS — R0602 Shortness of breath: Secondary | ICD-10-CM | POA: Insufficient documentation

## 2013-10-01 NOTE — Progress Notes (Signed)
2D Echo Performed 10/01/2013    Chariti Havel, RCS  

## 2013-10-02 ENCOUNTER — Ambulatory Visit (HOSPITAL_COMMUNITY)
Admission: RE | Admit: 2013-10-02 | Discharge: 2013-10-02 | Disposition: A | Discharge status: Home / Self Care | Payer: Medicare Other | Source: Ambulatory Visit | Attending: Cardiovascular Disease | Admitting: Cardiovascular Disease

## 2013-10-02 DIAGNOSIS — R0602 Shortness of breath: Secondary | ICD-10-CM

## 2013-10-19 ENCOUNTER — Telehealth: Payer: Self-pay | Admitting: Pulmonary Disease

## 2013-10-19 NOTE — Telephone Encounter (Signed)
Called and spoke with pt and she stated that she has appt with cardiology next week and she thinks that they will be referring her to [facility] for pulmonary issues.  She stated that she will wait until this appt to see what comes of this and she is aware that SN will still help her with primary care needs until she is set up with a new provider.  Pt voiced her understanding and she will call back next week.

## 2013-10-28 ENCOUNTER — Ambulatory Visit (INDEPENDENT_AMBULATORY_CARE_PROVIDER_SITE_OTHER): Payer: Medicare Other | Admitting: Internal Medicine

## 2013-10-28 ENCOUNTER — Encounter: Payer: Self-pay | Admitting: Internal Medicine

## 2013-10-28 VITALS — BP 140/90 | HR 78 | Ht 68.0 in | Wt 176.0 lb

## 2013-10-28 DIAGNOSIS — R0989 Other specified symptoms and signs involving the circulatory and respiratory systems: Secondary | ICD-10-CM

## 2013-10-28 DIAGNOSIS — R0609 Other forms of dyspnea: Secondary | ICD-10-CM

## 2013-10-28 DIAGNOSIS — R0600 Dyspnea, unspecified: Secondary | ICD-10-CM

## 2013-10-28 DIAGNOSIS — R0602 Shortness of breath: Secondary | ICD-10-CM

## 2013-10-28 NOTE — Progress Notes (Signed)
HPI Laura [name] returns today for followup. She is a pleasant 73 yo woman with a h/o unexplained dyspnea, who was referred by Dr. Lenna Gilford for evaluation of shortness of breath. The patient has subsequently undergone 2D echo which demonstrated normal LV function, normal valve function and no evidence of pulmonary HTN. She then underwent stress testing which demonstrated no evidence of ischemia. She had a fairly poor functional capacity. She admits to a very sedentary lifestyle. Allergies  Allergen Reactions  . Prednisone     REACTION: unable to take this med due to an eye condition  . Amoxicillin-Pot Clavulanate     REACTION: causes diarrhea  . Codeine     REACTION: nausea  . Levaquin [Levofloxacin In D5w]     Had itching w/ intravenous, not sure if reaction was from abx or the need to change the IV.  Does well when taken with Benadryl.  . Tdap [Diphth-Acell Pertussis-Tetanus]     Area raised, warm to touch, tender, swollen, pt felt jittery, nausea and some SOB     Current Outpatient Prescriptions  Medication Sig Dispense Refill  . aspirin 81 MG tablet Take 81 mg by mouth daily.        . B Complex Vitamins (VITAMIN-B COMPLEX PO) Take 1 capsule by mouth daily.       . Calcium Carbonate (CALTRATE 600 PO) Take 1 tablet by mouth 2 (two) times daily.        . Cholecalciferol (VITAMIN D) 1000 UNITS capsule Take 1,000 Units by mouth daily.        [name] esomeprazole (NEXIUM) 40 MG capsule Take 1 capsule (40 mg total) by mouth 2 (two) times daily before a meal.  60 capsule  6  . Glucosamine HCl (GLUCOSAMINE PO) Liquid form - Take once daily      . meclizine (ANTIVERT) 25 MG tablet Take 1/2-1 tablet by mouth every 4 hours as needed for dizziness      . Multiple Vitamins-Minerals (MULTIVITAL) tablet Take 1 tablet by mouth daily.        . Omega-3 Fatty Acids (FISH OIL) 1000 MG CAPS Take 1 capsule by mouth daily.        . ondansetron (ZOFRAN) 8 MG tablet 1 tablet by mouth every 6 hours as needed  for nausea  25 tablet  5  . simvastatin (ZOCOR) 40 MG tablet Take 1 tablet (40 mg total) by mouth at bedtime.  90 tablet  3  . sucralfate (CARAFATE) 1 G tablet Take 1 tablet (1 g total) by mouth as needed.  30 tablet  6   No current facility-administered medications for this visit.     Past Medical History  Diagnosis Date  . Uveitis   . Sarcoidosis   . MVP (mitral valve prolapse)   . Hypercholesterolemia   . GERD (gastroesophageal reflux disease)   . IBS (irritable bowel syndrome)   . Personal history of colonic polyps     SESSILE SERRATED ADENOMAS (X2)  . Urinary tract infection, site not specified   . Osteopenia   . Anxiety   . DJD (degenerative joint disease)   . Diverticulosis of colon (without mention of hemorrhage)   . Hiatal hernia   . Stricture and stenosis of esophagus   . Unspecified gastritis and gastroduodenitis without mention of hemorrhage   . Allergy     SEASONAL    ROS:   All systems reviewed and negative except as noted in the HPI.   Past Surgical  History  Procedure Laterality Date  . Vesicovaginal fistula closure w/ tah    . Basal cell carcinoma excision    . Cataract extraction      right  . Removal of mass from rt antecubital fossa    . Abdominal hysterectomy    . Upper gastrointestinal endoscopy    . Colonoscopy  2012     Family History  Problem Relation Age of Onset  . Sarcoidosis Sister   . Sarcoidosis Brother   . Other      nephew with Wegener's   . Cancer Mother     ? type  . Colon cancer Sister   . Stroke Sister      History   Social History  . Marital Status: Married    Spouse Name: Lasondra Hodgkins    Number of Children: 2  . Years of Education: N/A   Occupational History  . Retired     & Part Time Artist   Social History Main Topics  . Smoking status: Never Smoker   . Smokeless tobacco: Never Used  . Alcohol Use: 0.6 oz/week    1 Glasses of wine per week     Comment: occ wine   . Drug Use: No  . Sexual  Activity: Not on file   Other Topics Concern  . Not on file   Social History Narrative  . No narrative on file     BP 140/90  Pulse 78  Ht 5\' 8"  (1.727 m)  Wt 176 lb (79.833 kg)  BMI 26.77 kg/m2  Physical Exam:  Well appearing 73 yo woman, NAD HEENT: Unremarkable Neck:  No JVD, no thyromegally Back:  No CVA tenderness Lungs:  Clear with no wheezes HEART:  Regular rate rhythm, no murmurs, no rubs, no clicks Abd:  soft, positive bowel sounds, no organomegally, no rebound, no guarding Ext:  2 plus pulses, no edema, no cyanosis, no clubbing Skin:  No rashes no nodules Neuro:  CN II through XII intact, motor grossly intact   Assess/Plan:

## 2013-10-28 NOTE — Patient Instructions (Signed)
Your physician recommends that you schedule a follow-up appointment with Dr Lovena Le as needed and Dr Lenna Gilford after the pulmonary function test   Your physician has recommended that you have a pulmonary function test. Pulmonary Function Tests are a group of tests that measure how well air moves in and out of your lungs.

## 2013-10-28 NOTE — Assessment & Plan Note (Addendum)
The etiology of her symptoms remains unclear. I suspect she is simply deconditioned. There is no evidence of occult cardiac disease and no evidence of cardiac sarcoid based on ECG or echo. I did not think cardiac MRI would add much. If she had significant cardiac involvement she should have evidence of LV dysfunction.  As she has not had a repeat of PFT's for over 6 years, I will schedule her to undergo repeat PFT's. If her pulmonary sarcoid has not progressed, then we can attribute her dyspnea to deconditioning.

## 2013-11-11 ENCOUNTER — Encounter: Payer: Self-pay | Admitting: Internal Medicine

## 2013-12-09 ENCOUNTER — Encounter (INDEPENDENT_AMBULATORY_CARE_PROVIDER_SITE_OTHER): Payer: Self-pay

## 2013-12-09 ENCOUNTER — Ambulatory Visit (INDEPENDENT_AMBULATORY_CARE_PROVIDER_SITE_OTHER): Payer: Medicare Other | Admitting: Pulmonary Disease

## 2013-12-09 ENCOUNTER — Encounter: Payer: Self-pay | Admitting: Pulmonary Disease

## 2013-12-09 ENCOUNTER — Other Ambulatory Visit: Payer: Medicare Other

## 2013-12-09 VITALS — BP 142/70 | HR 78 | Temp 97.8°F | Ht 69.0 in | Wt 177.0 lb

## 2013-12-09 DIAGNOSIS — R0600 Dyspnea, unspecified: Secondary | ICD-10-CM

## 2013-12-09 DIAGNOSIS — K222 Esophageal obstruction: Secondary | ICD-10-CM

## 2013-12-09 DIAGNOSIS — M1990 Unspecified osteoarthritis, unspecified site: Secondary | ICD-10-CM

## 2013-12-09 DIAGNOSIS — F411 Generalized anxiety disorder: Secondary | ICD-10-CM

## 2013-12-09 DIAGNOSIS — M949 Disorder of cartilage, unspecified: Secondary | ICD-10-CM

## 2013-12-09 DIAGNOSIS — R0609 Other forms of dyspnea: Secondary | ICD-10-CM

## 2013-12-09 DIAGNOSIS — Z860100 Personal history of colon polyps, unspecified: Secondary | ICD-10-CM

## 2013-12-09 DIAGNOSIS — S32000A Wedge compression fracture of unspecified lumbar vertebra, initial encounter for closed fracture: Secondary | ICD-10-CM

## 2013-12-09 DIAGNOSIS — K589 Irritable bowel syndrome without diarrhea: Secondary | ICD-10-CM

## 2013-12-09 DIAGNOSIS — M545 Low back pain, unspecified: Secondary | ICD-10-CM

## 2013-12-09 DIAGNOSIS — E7800 Pure hypercholesterolemia, unspecified: Secondary | ICD-10-CM

## 2013-12-09 DIAGNOSIS — I059 Rheumatic mitral valve disease, unspecified: Secondary | ICD-10-CM

## 2013-12-09 DIAGNOSIS — D869 Sarcoidosis, unspecified: Secondary | ICD-10-CM

## 2013-12-09 DIAGNOSIS — K219 Gastro-esophageal reflux disease without esophagitis: Secondary | ICD-10-CM

## 2013-12-09 DIAGNOSIS — R0989 Other specified symptoms and signs involving the circulatory and respiratory systems: Secondary | ICD-10-CM

## 2013-12-09 DIAGNOSIS — R0602 Shortness of breath: Secondary | ICD-10-CM

## 2013-12-09 DIAGNOSIS — M899 Disorder of bone, unspecified: Secondary | ICD-10-CM

## 2013-12-09 LAB — PULMONARY FUNCTION TEST
DL/VA % pred: 95 %
DL/VA: 5 ml/min/mmHg/L
DLCO UNC: 22.12 ml/min/mmHg
DLCO unc % pred: 74 %
FEF 25-75 POST: 1.49 L/s
FEF 25-75 Pre: 1.1 L/sec
FEF2575-%Change-Post: 34 %
FEF2575-%Pred-Post: 75 %
FEF2575-%Pred-Pre: 55 %
FEV1-%Change-Post: 6 %
FEV1-%Pred-Post: 77 %
FEV1-%Pred-Pre: 72 %
FEV1-POST: 1.98 L
FEV1-Pre: 1.86 L
FEV1FVC-%CHANGE-POST: 3 %
FEV1FVC-%Pred-Pre: 93 %
FEV6-%Change-Post: 3 %
FEV6-%PRED-PRE: 80 %
FEV6-%Pred-Post: 83 %
FEV6-POST: 2.71 L
FEV6-PRE: 2.61 L
FEV6FVC-%CHANGE-POST: 0 %
FEV6FVC-%PRED-POST: 103 %
FEV6FVC-%Pred-Pre: 102 %
FVC-%CHANGE-POST: 2 %
FVC-%PRED-PRE: 78 %
FVC-%Pred-Post: 80 %
FVC-PRE: 2.65 L
FVC-Post: 2.72 L
POST FEV6/FVC RATIO: 100 %
PRE FEV1/FVC RATIO: 70 %
PRE FEV6/FVC RATIO: 99 %
Post FEV1/FVC ratio: 73 %
RV % PRED: 103 %
RV: 2.54 L
TLC % pred: 100 %
TLC: 5.7 L

## 2013-12-09 MED ORDER — SIMVASTATIN 40 MG PO TABS: 40.0000 mg | ORAL_TABLET | Freq: Every day | ORAL | Status: DC

## 2013-12-09 NOTE — Patient Instructions (Signed)
Today we updated your med list in our EPIC system...    Continue your current medications the same...  Today we did your Pulm function test & reviewed the results...  We also checked your ACE level (sarcoid blood test)...    We will contact you w/ the results when available...   Let's get on track w/ our diet & exercise program!!!  Call for any questions...  Let's plan a follow up visit in 76mo, sooner if needed for problems.Marland KitchenMarland Kitchen

## 2013-12-09 NOTE — Progress Notes (Signed)
PFT done today. 

## 2013-12-09 NOTE — Progress Notes (Signed)
Subjective:    Patient ID: Laura [name], female    DOB: 1941/01/27, 73 y.o.   MRN: [id number]  HPI 73 y/o WF here for a follow up visit... she has prob Sarcoidosis w/ hx Uveitis and BHA on CXR... we are following a "watchful waiting" protocol...she continues to feel well- no cough, sputum, CP, or SOB...   ~  Nov 21, 2011:  73mo ROV & Laura [name] is doing reasonably well w/o new complaints or concerns;     She was given Zithromax for an ear infection from an [facility] in [place]; 3/13 she developed an "irritation in my esoph" dyspagia/ odynophagia; EGD by DrPatterson revealed "Pill esophagitis" & she was treated w/ Carafate (off now), Nexium Bid, & Zantac 300Qhs;  improved now on & she will check w/ GI about a formulary PPI med...    We reviewed prob list, meds, xrays, & labs> see below>>   LABS 5/13:  FLP- at goals on Simva40;  Chems- wnl;  CBC- wnl;  TSH=1.61;  VitD=47  ADDENDUM>> she received TDAP w/ local reaction req antihist & topical Rx...  ~  May 23, 2012:  73mo ROV & Laura [name] reports some vertigo while on vacation recently & improved w/ Antivert...    Sarcoid> Hx abn CXR, she is asymptomatic; CXR today= right suprahilar nodule (likely sarcoid scarring) & chr changes...    MVP> on ASA81;  She denies CP, palpit, SOB, etc...     Chol> on Simva40; FLP 5/13 looked ok w/ TChol 159, TG 151, HDL 47, LDL 82; reviewed diet & wt reduction...    GI> GERD, IBS, Polyp> on Nexium40, Zantac300HS, CarafateQid prn, & Zofran prn;     UTIs> she reports no prob on Cranberry juice...    DJD, LBP, Osteop> on calium, MVI, VitD; we refilled Tramadol for prn use...    Anxiety> she has a lot of family support, doesn't require meds... We reviewed prob list, meds, xrays and labs> see below for updates >> OK Flu shot today...  CXR 11/13 showed similar changes- irreg nodular changes in right suprahilar region, scarring, stable adenopathy, mild atherosclerotic calcif, osteopenia...   ~  Dec 02, 2012:  73mo ROV & [name] reports  that in Feb2014 she fell while getting up at night & hit her back w/ severe pain & L1 compression fx- adm to [facility] for 2d w/ kyphoplasty & good pain relief; also told to have LLLpneumonia treated w/ Levaquin; she had f/u here by TP 3/14 & improved; since then she is back to normal- had nice [place] vacation, feeling well, etc...  We reviewed the following medical problems during today's office visit >>     Sarcoid> Hx abn CXR, she is asymptomatic; baseline CXR w/ right suprahilar nodule, adenop, scarring & chr changes; ?LLLpneum 7/61 in [place] treated w/ Levaquin...    MVP> on ASA81;  She denies CP, palpit, SOB, etc...     Chol> on Simva40; FLP 5/14 showed TChol 165, TG 157, HDL 41, LDL 93; reviewed diet & wt reduction...    GI> GERD, IBS, Polyp> on Nexium40Bid, Zantac300HS, CarafateQid prn, & Zofran prn; she remains asymptomatic w/o abd pain, n/v, c/d, blood seen...    UTIs> she reports no prob on Cranberry juice...    DJD, LBP, L1 compression w/ kyphoplasty, Osteop> on calium, MVI, VitD; we refilled Tramadol for prn use; she fell 2/14 w/ L1 compression & kyphoplasty in [place]; she needs f/u BMD.    Anxiety> she has a lot of family support,  doesn't require meds... We reviewed prob list, meds, xrays and labs> see below for updates >>   CXR 3/14 showed normal heart size, atherosclerotic Ao, chr scarring from underlying sarcoid, NAD...  LABS 5/14:  FLP- at goals on Simva40;  Chems- wnl;  CBC- wnl;  TSH=1.74;  VitD=52...  ~  June 03, 2013:  73mo ROV & Laura [name] has had some prob w/ pain & swelling in right ankle- she has appt w/ Ortho this afternoon (DrDaldorf)...     Hx prob Sarcoid and abn CXR; she remains asymptomatic & last film 3/14 showed stable scarring, NAD...     She remains on Simva40 for Chol; Labs 5/14 looked ok & we reviewed diet, exercise, & wt reduction strategies...    She has Hx GERD, IBS, colon polyps> on Nexium40Qam, Zantac300Qhs, Carafate 1gmQidPrn & doing fine w/o  abd apin, dysphagia, n/v, c/d, blood seen; last colon was 5/12...    She has hx DJD, LBP, L1 compression w/ kyphoplasty; on Tramadol prn, takes calcium, MVI, VitD; last BMD was done here 5/11 w/ TScores -1.2 in Spine and FemNecks... We reviewed prob list, meds, xrays and labs> see below for updates >> she had the 2014 flu vaccine in Oct...   ~  Dec 09, 2013:  73mo ROV & Lam saw TP several months ago c/o dyspnea and fatigue> she's been sedentary due to foot problem & when she tried to walk she noted trouble w/ hill, no CP, etc; hx sarcoid but she has never required treatment as it has been felt tto be inactive- f/u CXR unchanged; felt to likely be deconditioning but referred to Cards for their eval as well...    Seen by DrTaylor for [employer] 2DEcho showed norm LVF, norm valves, no signs of pulmHTN; subdeq stress testing showed extreme dyspnea w/ exercise (poor functional capacity), no ischemia; she has been encouraged to join silver sneakers and incr her exercise program...     Seen by DrPyrtle for GI> c/o epig discomfort, hx GERD/ IBS/ colon polyps; symptoms improved by incr Nexium to Bid & adding Carafate prn; she is up to date on screening...  We reviewed prob list, meds, xrays and labs> see below for updates >>   LABS 1/15:  Chems- ok x K=3.4;  CBC- wnl;  TSH=2.28;  Mag=2.0.Marland KitchenMarland Kitchen  CXR 1/15 showed norm heart size, areas of scarring appear unchanged, prom hilae w/o change- stable, NAD.Marland KitchenMarland Kitchen   EKG 3/15 showed NSR, rate79, wnl, NAD...  Stress Test 3/15 showed poor exercise capacity, no ischemia...  2DEcho 3/15 showed normal LVF w/ EF=55-60%, no regional wall motion abn, normal valves, no evid for pulmHTN...  PFTs 5/15 showed just some sm airways dis> FVC=2.65 (78%), FEV1=1.86 (72%); %1sec=70; mid-flows=55% predicted... After bronchodil her FEV1 improved 6%... Lung Volumes & DLCO are wnl...   LABS 5/15:  ACE level = 55          Problem List:  Hx of UVEITIS (ICD-364.3) - eval at [facility] since 2001, given  steroid injection in 2002... s/p right cataract surg 2/09 and no active uveitis seen... ~  4/11:  she tells me she was seen 10/10 & doing well, may need left cataract surg soon.  R/O SARCOIDOSIS (ICD-135) - Hx uveitis w/ eval at [facility], and subseq f/u CXR w/ CTChest showing bilat hilar adenopathy, and w/u showing neg PPD, ACE=53, sed=13... prev PFT's 1/08 showed FVC 3.75 (111%), FEV1 2.74 (102%), %1sec=73, mid-flows=70%... being followed w/ serial films and "watchful waiting"... there is a family hx of sarcoidosis. ~  we had a f/u CXR in Feb09- streaky opacity right mid lung zone, similar to 2008...  ~  CT Chest 7/08 & 6/09 showed mediastinal > hilar adenopathy some w/ calcif (generally smaller than 1/08), area of atx/ scarring right mid zone (infer RUL) & scat <1cm nodules in lower zones w/o change... ~  CXR 2/10 showed bilat hilar prominence & scarring appears the same- NAD... labs all WNL w/ ACE=56. ~  CXR 4/11 showed mild bilat hilar prominence, scarring, NAD- osteopenia & DJD in TSpine... ACE= 37. ~  CXR 4/12 showed stable bilat hilar prominence, scarring, NAD.[name]. ~  CXR 11/13 showed similar changes- irreg nodular changes in right suprahilar region, scarring, stable adenopathy, mild atherosclerotic calcif, osteopenia... ~  CXR 3/14 showed normal heart size, atherosclerotic Ao, chr scarring from underlying sarcoid, NAD...  MITRAL VALVE PROLAPSE (ICD-424.0) - on ASA 81mg /d... clinical dx w/ 2DEcho 1999 showing flat closure but no frank prolapse...  HYPERCHOLESTEROLEMIA (ICD-272.0) - on SIMVASTATIN 40mg /d now... ~  [place] 1/08 on Lip10 showed TChol 183, TG 122, HDL 45, LDL 114...  ~  [place] 2/09 on Lip10 showed TChol 183, TG 189, HDL 39, LDL 107... continue diet & change to Simva40.  ~  FLP 5/09 on Simva40 showed TChol 185, TG 163, HDL 36, LDL 117... she wants to stay on Simva40. ~  FLP 2/10 showed TChol 176, Tg 160, HDL 39, LDL 105... rec> continue same. ~  FLP 4/11 showed TChol 160, TG 131, HDL 41,  LDL 93 ~  FLP 4/12 showed TChol 174, TG 148, HDL 43, LDL 102 ~  FLP 5/13 showed TChol 159, TG 151, HDL 47, LDL 82 ~  FLP 5/14 showed TChol 165, TG 157, HDL 41, LDL 93  GERD (ICD-530.81) - on NEXIUM 40mg /d & ZANTAC 300mg Qhs... ~  EGD 6/07 w/ 3cmHH, stricture, gastitis (HPylori neg)... I offered to change her Nexium to a generic but she declined- "DrPatterson told me never to change this med"... she notes occas nocturnal reflux symptoms. ~  5/12: She had GI eval by DrPatterson 5/12> chr GERD, hx 3cmHH, prev stricture dilated, hx colon polyps, etc> c/o incr reflux symptoms, bloating, pressure despite her Nexium40AM & Zantac300PM;  Nexium was increased to Bid, Carafate added Qid & she had EGD 5/12 showing mod gastritis w/ bx= chr inflamm, neg HPylori... ~  CTAbd 5/12 showed mult parenchymal nodules at lung bases (some fractionally larger), calcif hilar nodes bilat, no renal lesions (?left upper pole mass seen on sonar?). ~  EGD 3/13 by DrPatterson showed 3cmHH, esophagitis, prob "pill espohagitis", dilated... Symptoms resolved w/ Carafate, Nexium, Zantac, Xylocaine... ~  5/14: on Nexium40Bid, Zantac300HS, CarafateQid prn, & Zofran prn; she remains asymptomatic w/o abd pain, n/v, c/d, blood seen...  IRRITABLE BOWEL SYNDROME (ICD-564.1) COLONIC POLYPS, HX OF (ICD-V12.72)  ~  Colonoscopy 6/07 by DrPatterson showing divertics & polyps (adenomatous)... f/u planned 53yrs... ~  She had f/u colonoscopy 5/12 showing mod divertics, sessile polyp in cecum= serrated adenoma w/ f/u planned 3-65yrs...  UTI'S, CHRONIC (ICD-599.0) - eval by DrOttelin for urology... now off the Macrodantin and using cranberry juice without recurrent UTI, no problems...  DEGENERATIVE JOINT DISEASE (ICD-715.90) - eval by DrDalldorf w/ mod DJD in knees & hx of torn left meniscus... 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 overall improved now. ~   11/13:  TRAMADOL 50mg  Tid refilled for prn use...  LOW BACK PAIN, CHRONIC (ICD-724.2) ~  2/14:  she fell while getting up at night & hit her back w/ severe pain & L1 compression fx- adm to [facility] for 2d w/ kyphoplasty & good pain relief...  OSTEOPENIA (ICD-733.90) - on Calcium, MVI, Vit D... ~  BMD in 2005 showed TScore -1.2 in Spine, & -0.7 in [facility]. ~  BMD 5/09 showed TScores -1.5 in Spine & -1.1 in FemNecks. ~  BMD here 5/11 showed TScores -1.2 in Spine, and -1.2 in [facility]. ~  5/13 & 5/14:  She is due for f/u BMD but wants to wait for now...  ANXIETY (ICD-300.00)  Hx of BASAL CELL SKIN CANCER - she still sees her Derm in [place] yearly...  Health Maintenance: ~  GI:  followed by DrPatterson w/ colon as above... ~  GYN:  followed by DrRichardson & doing well by pt's report... Mammograms at the [place]... ~  Immunizations:  she gets yearly flu shots;  has PNEUMOVAX in 1998 & repeated 10/11 (age 64);  TDAP given 5/13;  we discussed Shingles vaccine (she had bout of shingles involving her right hip & leg ~age30) & she will decide.    Past Surgical History  Procedure Laterality Date  . Vesicovaginal fistula closure w/ tah    . Basal cell carcinoma excision    . Cataract extraction      right  . Removal of mass from rt antecubital fossa    . Abdominal hysterectomy    . Upper gastrointestinal endoscopy    . Colonoscopy  2012    Outpatient Encounter Prescriptions as of 12/09/2013  Medication Sig  . aspirin 81 MG tablet Take 81 mg by mouth daily.    . B Complex Vitamins (VITAMIN-B COMPLEX PO) Take 1 capsule by mouth daily.   . Calcium Carbonate (CALTRATE 600 PO) Take 1 tablet by mouth 2 (two) times daily.    . Cholecalciferol (VITAMIN D) 1000 UNITS capsule Take 1,000 Units by mouth daily.    [name] esomeprazole (NEXIUM) 40 MG capsule Take 40 mg by mouth daily at 12 noon.  . Glucosamine HCl (GLUCOSAMINE PO) Liquid form - Take once daily  . meclizine (ANTIVERT) 25 MG  tablet Take 1/2-1 tablet by mouth every 4 hours as needed for dizziness  . Multiple Vitamins-Minerals (MULTIVITAL) tablet Take 1 tablet by mouth daily.    . Omega-3 Fatty Acids (FISH OIL) 1000 MG CAPS Take 1 capsule by mouth daily.    . ondansetron (ZOFRAN) 8 MG tablet 1 tablet by mouth every 6 hours as needed for nausea  . simvastatin (ZOCOR) 40 MG tablet Take 1 tablet (40 mg total) by mouth at bedtime.  . sucralfate (CARAFATE) 1 G tablet Take 1 tablet (1 g total) by mouth as needed.  . [DISCONTINUED] esomeprazole (NEXIUM) 40 MG capsule Take 1 capsule (40 mg total) by mouth 2 (two) times daily before a meal.    Allergies  Allergen Reactions  . Prednisone     REACTION: unable to take this med due to an eye condition  . Amoxicillin-Pot Clavulanate     REACTION: causes diarrhea  . Codeine     REACTION: nausea  . Levaquin [Levofloxacin In D5w]     Had itching w/ intravenous, not sure if reaction was from abx or the need to change the IV.  Does well when taken with Benadryl.  . Tdap [Diphth-Acell Pertussis-Tetanus]     Area raised, warm to touch, tender, swollen, pt felt jittery, nausea and some SOB    Review of Systems  See HPI - all other systems neg except as noted... The patient denies anorexia, fever, weight loss, weight gain, vision loss, decreased hearing, hoarseness, chest pain, syncope, dyspnea on exertion, peripheral edema, prolonged cough, headaches, hemoptysis, abdominal pain, melena, hematochezia, severe indigestion/heartburn, hematuria, incontinence, muscle weakness, suspicious skin lesions, transient blindness, difficulty walking, depression, unusual weight change, abnormal bleeding, enlarged lymph nodes, and angioedema.     Objective:   Physical Exam    WD, WN, 73 y/o WF in NAD... GENERAL:  Alert & oriented; pleasant & cooperative... HEENT:  [place]/AT, EOM-wnl, PERRLA, EACs-clear, TMs-wnl, NOSE-clear, THROAT-clear & wnl. NECK:  Supple w/ fairROM; no JVD; normal  carotid impulses w/o bruits; no thyromegaly or nodules palpated; no lymphadenopathy. CHEST:  Clear to P & A; without wheezes/ rales/ or rhonchi. HEART:  Regular Rhythm; without murmurs/ rubs/ or gallops. ABDOMEN:  Soft & nontender; normal bowel sounds; no organomegaly or masses detected. EXT: without deformities, mild arthritic changes; no varicose veins/ +venous insuffic/ no edema. NEURO:  CN's intact;  no focal neuro deficits... DERM:  No lesions noted; no rash etc...  RADIOLOGY DATA:  Reviewed in the EPIC EMR & discussed w/ the patient...  LABORATORY DATA:  Reviewed in the EPIC EMR & discussed w/ the patient...   Assessment & Plan:    DYSPNEA w/ thorough PULM & CARDIAC eval>> underlying sarcoidosis w/ mild pulm fibrosis, no signs of dis activity, remains on watchful waiting protocol; Cards eval by DrTaylor was neg as well; rec to start gradual exercise program...  SARCOID>  CXR stable & she remains asymptomatic; PFT, CXR, ACE- reviewed...  CHOL>  Stable on the Simva40 + diet...  GERD>  On Nexium & Zantac, she is currently improved from her prev symptoms...  DJD/ LBP/ Osteopenia>  As noted she remains stable on current meds & exercise program; she will see DrDaldorf about her ankle pain...  Compression fx L1 after fall> she is s/p L1 kyphoplasty...  Other medical issues as noted...   Patient's Medications  New Prescriptions   No medications on file  Previous Medications   ASPIRIN 81 MG TABLET    Take 81 mg by mouth daily.     B COMPLEX VITAMINS (VITAMIN-B COMPLEX PO)    Take 1 capsule by mouth daily.    CALCIUM CARBONATE (CALTRATE 600 PO)    Take 1 tablet by mouth 2 (two) times daily.     CHOLECALCIFEROL (VITAMIN D) 1000 UNITS CAPSULE    Take 1,000 Units by mouth daily.     GLUCOSAMINE HCL (GLUCOSAMINE PO)    Liquid form - Take once daily   MECLIZINE (ANTIVERT) 25 MG TABLET    Take 1/2-1 tablet by mouth every 4 hours as needed for dizziness   MULTIPLE VITAMINS-MINERALS  (MULTIVITAL) TABLET    Take 1 tablet by mouth daily.     OMEGA-3 FATTY ACIDS (FISH OIL) 1000 MG CAPS    Take 1 capsule by mouth daily.     ONDANSETRON (ZOFRAN) 8 MG TABLET    1 tablet by mouth every 6 hours as needed for nausea   SUCRALFATE (CARAFATE) 1 G TABLET    Take 1 tablet (1 g total) by mouth as needed.  Modified Medications   Modified Medication Previous Medication   ESOMEPRAZOLE (NEXIUM) 40 MG CAPSULE esomeprazole (NEXIUM) 40 MG capsule      Take 40 mg by mouth daily at 12 noon.    Take 1 capsule (40 mg total) by mouth 2 (two) times daily before a meal.  SIMVASTATIN (ZOCOR) 40 MG TABLET simvastatin (ZOCOR) 40 MG tablet      Take 1 tablet (40 mg total) by mouth at bedtime.    Take 1 tablet (40 mg total) by mouth at bedtime.  Discontinued Medications   No medications on file

## 2013-12-10 LAB — ANGIOTENSIN CONVERTING ENZYME: ANGIOTENSIN-CONVERTING ENZYME: 55 U/L — AB (ref 8–52)

## 2013-12-11 NOTE — Progress Notes (Signed)
Quick Note:  LMTCB ______ 

## 2013-12-25 ENCOUNTER — Telehealth: Payer: Self-pay | Admitting: Pulmonary Disease

## 2013-12-25 NOTE — Telephone Encounter (Signed)
Called and spoke with pt and she is aware of lab results per SN. Pt voiced her understanding and nothing further is needed 

## 2013-12-29 ENCOUNTER — Encounter: Payer: Self-pay | Admitting: Internal Medicine

## 2013-12-30 ENCOUNTER — Encounter: Payer: Self-pay | Admitting: Internal Medicine

## 2013-12-30 ENCOUNTER — Ambulatory Visit (INDEPENDENT_AMBULATORY_CARE_PROVIDER_SITE_OTHER): Payer: Medicare Other | Admitting: Internal Medicine

## 2013-12-30 VITALS — BP 132/72 | HR 66 | Ht 69.0 in | Wt 177.8 lb

## 2013-12-30 DIAGNOSIS — K219 Gastro-esophageal reflux disease without esophagitis: Secondary | ICD-10-CM

## 2013-12-30 MED ORDER — ESOMEPRAZOLE MAGNESIUM 40 MG PO CPDR: 40.0000 mg | DELAYED_RELEASE_CAPSULE | Freq: Two times a day (BID) | ORAL | Status: DC

## 2013-12-30 MED ORDER — SUCRALFATE 1 G PO TABS: 1.0000 g | ORAL_TABLET | ORAL | Status: DC | PRN

## 2013-12-30 NOTE — Progress Notes (Signed)
Subjective:    Patient ID: Laura [name], female    DOB: Jul 04, 1941, 73 y.o.   MRN: [id number]  HPI Laura [name] is a 73 year old female with a past medical history of sarcoidosis, GERD with esophagitis, IBS, adenomatous colon polyps, DJD, hyperlipidemia who is seen in followup. She is here alone today. She was last seen in March 2015. Since seeing me she has been seen by Dr. Lovena Le with cardiology and Dr. Lenna Gilford with pulmonology.  She reports she is feeling well. She was taking Nexium twice daily for heartburn symptoms, but decreased to once daily. She is using Carafate 1 g at bedtime. She reports this is doing a nice job of controlling her GERD symptoms. Occasionally she will have breakthrough heartburn at night but she relates this to diet and timing of her dinner. When she eats late she tends to have more nocturnal symptoms and also when she eats heavy or spicy foods such as beef, her nocturnal symptoms are worse. She denies dysphagia or diet dysphagia. She denies chest pain or dyspnea today. Appetite has been good. She denies nausea or vomiting. Bowel movements are regular without blood or melena.   Review of Systems As per history of present illness, otherwise negative  Current Medications, Allergies, Past Medical History, Past Surgical History, Family History and Social History were reviewed in [employer] record.     Objective:   Physical Exam BP 132/72  Pulse 66  Ht 5\' 9"  (1.753 m)  Wt 177 lb 12.8 oz (80.65 kg)  BMI 26.24 kg/m2 Constitutional: Well-developed and well-nourished. No distress.  HEENT: Normocephalic and atraumatic. Oropharynx is clear and moist. No oropharyngeal exudate. Conjunctivae are normal. No scleral icterus.  Neck: Neck supple. Trachea midline.  Cardiovascular: Normal rate, regular rhythm and intact distal pulses.  Pulmonary/chest: Effort normal and breath sounds normal. No wheezing, rales or rhonchi.  Abdominal: Soft, nontender,  nondistended. Bowel sounds active throughout.  Extremities: no clubbing, cyanosis, or edema  Neurological: Alert and oriented to person place and time.  Psychiatric: Normal mood and affect. Behavior is normal.   CBC    Component Value Date/Time   WBC 6.2 08/21/2013 1001   RBC 5.05 08/21/2013 1001   HGB 15.4* 08/21/2013 1001   HCT 46.9* 08/21/2013 1001   PLT 260.0 08/21/2013 1001   MCV 92.8 08/21/2013 1001   MCHC 32.8 08/21/2013 1001   RDW 13.4 08/21/2013 1001   LYMPHSABS 1.6 08/21/2013 1001   MONOABS 0.5 08/21/2013 1001   EOSABS 0.2 08/21/2013 1001   BASOSABS 0.0 08/21/2013 1001    Last EGD reviewed from 2013, Dr. Sharlett Iles -- mild esophagitis, large rugal folds which were biopsied, normal duodenum     Assessment & Plan:  72 year old female with a past medical history of sarcoidosis, GERD with esophagitis, IBS, adenomatous colon polyps, DJD, hyperlipidemia who is seen in followup.  1.  GERD -- following an anti-reflux diet and also not eating late will likely help her significantly. She will continue Nexium 40 mg daily. I have given her permission to use a second evening dose of Nexium before dinner if she knows she is going to eat late or eat a particularly heavy meal. She can also continue Carafate 1 g at bedtime as this seems to benefit her symptomatically. She is without dysphagia or other alarm symptoms. Magnesium checked recently and normal. She reports she stays current with primary care on bone mineral density testing and she is taking calcium and vitamin D supplementation. Return in 6  months for this issue --Change Nexium to generic esomeprazole to help with cost  2.  History of adenomatous polyp -- repeat surveillance colonoscopy recommended 2017

## 2013-12-30 NOTE — Patient Instructions (Signed)
We have sent the following medications to your pharmacy for you to pick up at your convenience: Esomeprazole 40 mg carafate at bedtime.  Follow up with Dr. Hilarie Fredrickson in office in 6 months

## 2014-01-21 ENCOUNTER — Telehealth: Payer: Self-pay | Admitting: Internal Medicine

## 2014-01-25 MED ORDER — ESOMEPRAZOLE MAGNESIUM 40 MG PO CPDR: 40.0000 mg | DELAYED_RELEASE_CAPSULE | Freq: Two times a day (BID) | ORAL | Status: DC

## 2014-01-25 NOTE — Telephone Encounter (Signed)
Resent in generic nexium

## 2014-01-28 NOTE — Telephone Encounter (Signed)
Encounter complete. 

## 2014-06-07 ENCOUNTER — Encounter: Payer: Self-pay | Admitting: Pulmonary Disease

## 2014-06-07 ENCOUNTER — Other Ambulatory Visit (INDEPENDENT_AMBULATORY_CARE_PROVIDER_SITE_OTHER): Payer: Medicare Other

## 2014-06-07 ENCOUNTER — Encounter (INDEPENDENT_AMBULATORY_CARE_PROVIDER_SITE_OTHER): Payer: Self-pay

## 2014-06-07 ENCOUNTER — Ambulatory Visit (INDEPENDENT_AMBULATORY_CARE_PROVIDER_SITE_OTHER): Payer: Medicare Other | Admitting: Pulmonary Disease

## 2014-06-07 VITALS — BP 120/76 | HR 63 | Temp 97.8°F | Ht 68.25 in | Wt 179.0 lb

## 2014-06-07 DIAGNOSIS — E7800 Pure hypercholesterolemia, unspecified: Secondary | ICD-10-CM

## 2014-06-07 DIAGNOSIS — M159 Polyosteoarthritis, unspecified: Secondary | ICD-10-CM

## 2014-06-07 DIAGNOSIS — F419 Anxiety disorder, unspecified: Secondary | ICD-10-CM

## 2014-06-07 DIAGNOSIS — D869 Sarcoidosis, unspecified: Secondary | ICD-10-CM

## 2014-06-07 DIAGNOSIS — M899 Disorder of bone, unspecified: Secondary | ICD-10-CM

## 2014-06-07 DIAGNOSIS — M150 Primary generalized (osteo)arthritis: Secondary | ICD-10-CM

## 2014-06-07 DIAGNOSIS — K589 Irritable bowel syndrome without diarrhea: Secondary | ICD-10-CM

## 2014-06-07 DIAGNOSIS — S32000D Wedge compression fracture of unspecified lumbar vertebra, subsequent encounter for fracture with routine healing: Secondary | ICD-10-CM

## 2014-06-07 DIAGNOSIS — I059 Rheumatic mitral valve disease, unspecified: Secondary | ICD-10-CM

## 2014-06-07 DIAGNOSIS — M949 Disorder of cartilage, unspecified: Secondary | ICD-10-CM

## 2014-06-07 DIAGNOSIS — K222 Esophageal obstruction: Secondary | ICD-10-CM

## 2014-06-07 DIAGNOSIS — K219 Gastro-esophageal reflux disease without esophagitis: Secondary | ICD-10-CM

## 2014-06-07 DIAGNOSIS — Z86010 Personal history of colonic polyps: Secondary | ICD-10-CM

## 2014-06-07 LAB — CBC WITH DIFFERENTIAL/PLATELET
BASOS PCT: 0.6 % (ref 0.0–3.0)
Basophils Absolute: 0 10*3/uL (ref 0.0–0.1)
EOS ABS: 0.2 10*3/uL (ref 0.0–0.7)
Eosinophils Relative: 2.9 % (ref 0.0–5.0)
HEMATOCRIT: 47 % — AB (ref 36.0–46.0)
HEMOGLOBIN: 15.5 g/dL — AB (ref 12.0–15.0)
LYMPHS ABS: 1.8 10*3/uL (ref 0.7–4.0)
LYMPHS PCT: 28.8 % (ref 12.0–46.0)
MCHC: 33 g/dL (ref 30.0–36.0)
MCV: 90.8 fl (ref 78.0–100.0)
Monocytes Absolute: 0.4 10*3/uL (ref 0.1–1.0)
Monocytes Relative: 7.1 % (ref 3.0–12.0)
NEUTROS ABS: 3.7 10*3/uL (ref 1.4–7.7)
Neutrophils Relative %: 60.6 % (ref 43.0–77.0)
Platelets: 253 10*3/uL (ref 150.0–400.0)
RBC: 5.17 Mil/uL — AB (ref 3.87–5.11)
RDW: 13.2 % (ref 11.5–15.5)
WBC: 6.2 10*3/uL (ref 4.0–10.5)

## 2014-06-07 LAB — LIPID PANEL
Cholesterol: 169 mg/dL (ref 0–200)
HDL: 40.9 mg/dL (ref >39.00)
LDL CALC: 101 mg/dL — AB (ref 0–99)
NONHDL: 128.1
Total CHOL/HDL Ratio: 4
Triglycerides: 135 mg/dL (ref 0.0–149.0)
VLDL: 27 mg/dL (ref 0.0–40.0)

## 2014-06-07 LAB — BASIC METABOLIC PANEL
BUN: 12 mg/dL (ref 6–23)
CALCIUM: 9.5 mg/dL (ref 8.4–10.5)
CO2: 24 meq/L (ref 19–32)
CREATININE: 0.8 mg/dL (ref 0.4–1.2)
Chloride: 106 mEq/L (ref 96–112)
GFR: 80.32 mL/min (ref >60.00)
Glucose, Bld: 93 mg/dL (ref 70–99)
Potassium: 4.1 mEq/L (ref 3.5–5.1)
Sodium: 143 mEq/L (ref 135–145)

## 2014-06-07 LAB — HEPATIC FUNCTION PANEL
ALK PHOS: 86 U/L (ref 39–117)
ALT: 19 U/L (ref 0–35)
AST: 21 U/L (ref 0–37)
Albumin: 4.3 g/dL (ref 3.5–5.2)
BILIRUBIN DIRECT: 0.1 mg/dL (ref 0.0–0.3)
TOTAL PROTEIN: 7.7 g/dL (ref 6.0–8.3)
Total Bilirubin: 1.2 mg/dL (ref 0.2–1.2)

## 2014-06-07 MED ORDER — SIMVASTATIN 40 MG PO TABS: 40.0000 mg | ORAL_TABLET | Freq: Every day | ORAL | Status: DC

## 2014-06-07 NOTE — Patient Instructions (Signed)
Today we updated your med list in our EPIC system...    Continue your current medications the same...  Today we did your follow up FASTING blood work...    We will contact you w/ the results when available...   Keep up the good work w/ diet & exercise...  Call for any questions...  Let's plan a follow up visit in 80mo, sooner if needed for problems.Marland KitchenMarland Kitchen

## 2014-06-08 ENCOUNTER — Encounter: Payer: Self-pay | Admitting: Pulmonary Disease

## 2014-06-08 LAB — VITAMIN D 25 HYDROXY (VIT D DEFICIENCY, FRACTURES): VITD: 51.51 ng/mL (ref 30.00–100.00)

## 2014-06-08 LAB — TSH: TSH: 2.19 u[IU]/mL (ref 0.35–4.50)

## 2014-06-08 NOTE — Progress Notes (Addendum)
Subjective:    Patient ID: Laura [name], female    DOB: 1941/01/27, 73 y.o.   MRN: [id number]  HPI 73 y/o WF here for a follow up visit... she has prob Sarcoidosis w/ hx Uveitis and BHA on CXR... we are following a "watchful waiting" protocol...she continues to feel well- no cough, sputum, CP, or SOB...   ~  Nov 21, 2011:  74mo ROV & Laura [name] is doing reasonably well w/o new complaints or concerns;     She was given Zithromax for an ear infection from an [facility] in [place]; 3/13 she developed an "irritation in my esoph" dyspagia/ odynophagia; EGD by DrPatterson revealed "Pill esophagitis" & she was treated w/ Carafate (off now), Nexium Bid, & Zantac 300Qhs;  improved now on & she will check w/ GI about a formulary PPI med...    We reviewed prob list, meds, xrays, & labs> see below>>   LABS 5/13:  FLP- at goals on Simva40;  Chems- wnl;  CBC- wnl;  TSH=1.61;  VitD=47  ADDENDUM>> she received TDAP w/ local reaction req antihist & topical Rx...  ~  May 23, 2012:  54mo ROV & Laura [name] reports some vertigo while on vacation recently & improved w/ Antivert...    Sarcoid> Hx abn CXR, she is asymptomatic; CXR today= right suprahilar nodule (likely sarcoid scarring) & chr changes...    MVP> on ASA81;  She denies CP, palpit, SOB, etc...     Chol> on Simva40; FLP 5/13 looked ok w/ TChol 159, TG 151, HDL 47, LDL 82; reviewed diet & wt reduction...    GI> GERD, IBS, Polyp> on Nexium40, Zantac300HS, CarafateQid prn, & Zofran prn;     UTIs> she reports no prob on Cranberry juice...    DJD, LBP, Osteop> on calium, MVI, VitD; we refilled Tramadol for prn use...    Anxiety> she has a lot of family support, doesn't require meds... We reviewed prob list, meds, xrays and labs> see below for updates >> OK Flu shot today...  CXR 11/13 showed similar changes- irreg nodular changes in right suprahilar region, scarring, stable adenopathy, mild atherosclerotic calcif, osteopenia...   ~  Dec 02, 2012:  72mo ROV & Laura [name] reports  that in Feb2014 she fell while getting up at night & hit her back w/ severe pain & L1 compression fx- adm to [facility] for 2d w/ kyphoplasty & good pain relief; also told to have LLLpneumonia treated w/ Levaquin; she had f/u here by TP 3/14 & improved; since then she is back to normal- had nice [place] vacation, feeling well, etc...  We reviewed the following medical problems during today's office visit >>     Sarcoid> Hx abn CXR, she is asymptomatic; baseline CXR w/ right suprahilar nodule, adenop, scarring & chr changes; ?LLLpneum 7/61 in [place] treated w/ Levaquin...    MVP> on ASA81;  She denies CP, palpit, SOB, etc...     Chol> on Simva40; FLP 5/14 showed TChol 165, TG 157, HDL 41, LDL 93; reviewed diet & wt reduction...    GI> GERD, IBS, Polyp> on Nexium40Bid, Zantac300HS, CarafateQid prn, & Zofran prn; she remains asymptomatic w/o abd pain, n/v, c/d, blood seen...    UTIs> she reports no prob on Cranberry juice...    DJD, LBP, L1 compression w/ kyphoplasty, Osteop> on calium, MVI, VitD; we refilled Tramadol for prn use; she fell 2/14 w/ L1 compression & kyphoplasty in [place]; she needs f/u BMD.    Anxiety> she has a lot of family support,  doesn't require meds... We reviewed prob list, meds, xrays and labs> see below for updates >>   CXR 3/14 showed normal heart size, atherosclerotic Ao, chr scarring from underlying sarcoid, NAD...  LABS 5/14:  FLP- at goals on Simva40;  Chems- wnl;  CBC- wnl;  TSH=1.74;  VitD=52...  ~  June 03, 2013:  53mo ROV & Laura [name] has had some prob w/ pain & swelling in right ankle- she has appt w/ Ortho this afternoon (DrDaldorf)...     Hx prob Sarcoid and abn CXR; she remains asymptomatic & last film 3/14 showed stable scarring, NAD...     She remains on Simva40 for Chol; Labs 5/14 looked ok & we reviewed diet, exercise, & wt reduction strategies...    She has Hx GERD, IBS, colon polyps> on Nexium40Qam, Zantac300Qhs, Carafate 1gmQidPrn & doing fine w/o  abd apin, dysphagia, n/v, c/d, blood seen; last colon was 5/12...    She has hx DJD, LBP, L1 compression w/ kyphoplasty; on Tramadol prn, takes calcium, MVI, VitD; last BMD was done here 5/11 w/ TScores -1.2 in Spine and [facility]ec[facility]... We reviewed prob list, meds, xrays and labs> see below for updates >> she had the 2014 flu vaccine in Oct...   ~  Dec 09, 2013:  70mo ROV & Laura [name] saw TP several months ago c/o dyspnea and fatigue> she's been sedentary due to foot problem & when she tried to walk she noted trouble w/ hills, no CP, etc; hx sarcoid but she has never required treatment as it has been felt to be inactive- f/u CXR unchanged; felt to likely be deconditioning but referred to Cards for their eval as well...    Seen by DrTaylor for [employer] 2DEcho showed norm LVF, norm valves, no signs of pulmHTN; subdeq stress testing showed extreme dyspnea w/ exercise (poor functional capacity), no ischemia; she has been encouraged to join silver sneakers and incr her exercise program...     Seen by DrPyrtle for GI> c/o epig discomfort, hx GERD/ IBS/ colon polyps; symptoms improved by incr Nexium to Bid & adding Carafate prn; she is up to date on screening...  We reviewed prob list, meds, xrays and labs> see below for updates >>   LABS 1/15:  Chems- ok x K=3.4;  CBC- wnl;  TSH=2.28;  Mag=2.0.Marland KitchenMarland Kitchen  CXR 1/15 showed norm heart size, areas of scarring appear unchanged, prom hilae w/o change- stable, NAD.Marland KitchenMarland Kitchen   EKG 3/15 showed NSR, rate79, wnl, NAD...  Stress Test 3/15 showed poor exercise capacity, no ischemia...  2DEcho 3/15 showed normal LVF w/ EF=55-60%, no regional wall motion abn, normal valves, no evid for pulmHTN...  PFTs 5/15 showed just some sm airways dis> FVC=2.65 (78%), FEV1=1.86 (72%); %1sec=70; mid-flows=55% predicted... After bronchodil her FEV1 improved 6%... Lung Volumes & DLCO are wnl...   LABS 5/15:  ACE level = 55     ~  June 07, 2014:  22mo ROV & Laura [name] is improved> she has been exercising at  home 2-3d/wk & [facility] 2[facility]; she notes less dyspnea w/ walking, on hills, etc...  She saw DrPyrtle 6/15- Hx GERD w/ esoph, IBS, adenomatous colon polyps; on Nexium40/d and Carafate 1tmQhs and doing well w/o breakthrough symptoms...  We reviewed the following medical problems during today's office visit >>     Sarcoid> Hx abn CXR, she is asymptomatic; baseline CXR w/ right suprahilar nodule, adenop, scarring & chr changes; ?LLLpneum 3/15 in [place] treated w/ Levaquin; last CXR 1/15 was stable and last ACE=55 .Marland KitchenMarland Kitchen  MVP> on ASA81;  She denies CP, palpit, SOB, etc...     Chol> on Simva40 & FishOil; last FLP 11/15 showed TChol 169, TG 135, HDL 41, LDL 101; reviewed diet & wt reduction...    GI> GERD, IBS, Polyp> on Nexium40, Carafate1GmQhs, & Zofran prn; she remains asymptomatic w/o abd pain, n/v, c/d, blood seen...    UTIs> she reports no prob on Cranberry juice...    DJD, LBP, L1 compression w/ kyphoplasty, Osteop> on calium, MVI, VitD & OTC analgesics; she fell 2/14 w/ L1 compression & kyphoplasty in [place]; she is ready for f/u BMD=> pending...    Anxiety> she has a lot of family support, doesn't require meds... We reviewed prob list, meds, xrays and labs> see below for updates >> she had the 2015 Flu shot recently, she is in need of the Prevnar-13 vaccine but wants to wait...   LABS 11/15:  FLP- looks good on Simva40, needs to lose wt;  Chems- wnl;  CBC- wnl;  TSH=2.19;  VitD=52...          Problem List:  Hx of UVEITIS (ICD-364.3) - eval at [facility] since 2001, given steroid injection in 2002... s/p right cataract surg 2/09 and no active uveitis seen... ~  4/11:  she tells me she was seen 10/10 & doing well, may need left cataract surg soon.  R/O SARCOIDOSIS (ICD-135) - Hx uveitis w/ eval at [facility], and subseq f/u CXR w/ CTChest showing bilat hilar adenopathy, and w/u showing neg PPD, ACE=53, sed=13... prev PFT's 1/08 showed FVC 3.75 (111%), FEV1 2.74 (102%), %1sec=73, mid-flows=70%... being  followed w/ serial films and "watchful waiting"... there is a family hx of sarcoidosis. ~   we had a f/u CXR in Feb09- streaky opacity right mid lung zone, similar to 2008...  ~  CT Chest 7/08 & 6/09 showed mediastinal > hilar adenopathy some w/ calcif (generally smaller than 1/08), area of atx/ scarring right mid zone (infer RUL) & scat <1cm nodules in lower zones w/o change... ~  CXR 2/10 showed bilat hilar prominence & scarring appears the same- NAD... labs all WNL w/ ACE=56. ~  CXR 4/11 showed mild bilat hilar prominence, scarring, NAD- osteopenia & DJD in TSpine... ACE= 37. ~  CXR 4/12 showed stable bilat hilar prominence, scarring, NAD.[name]. ~  CXR 11/13 showed similar changes- irreg nodular changes in right suprahilar region, scarring, stable adenopathy, mild atherosclerotic calcif, osteopenia... ~  CXR 3/14 showed normal heart size, atherosclerotic Ao, chr scarring from underlying sarcoid, NAD.[name]. ~  CXR 1/15 showed norm heart size, areas of scarring appear unchanged, prom hilae w/o change- stable, NAD ~  PFTs 5/15 showed just some sm airways dis> FVC=2.65 (78%), FEV1=1.86 (72%); %1sec=70; mid-flows=55% predicted... After bronchodil her FEV1 improved 6%... Lung Volumes & DLCO are wnl. ~  LABS 5/15:  ACE level = 55   MITRAL VALVE PROLAPSE (ICD-424.0) - on ASA 81mg /d... clinical dx w/ 2DEcho 1999 showing flat closure but no frank prolapse...  HYPERCHOLESTEROLEMIA (ICD-272.0) - on SIMVASTATIN 40mg /d now... ~  [place] 1/08 on Lip10 showed TChol 183, TG 122, HDL 45, LDL 114...  ~  [place] 2/09 on Lip10 showed TChol 183, TG 189, HDL 39, LDL 107... continue diet & change to Simva40.  ~  FLP 5/09 on Simva40 showed TChol 185, TG 163, HDL 36, LDL 117... she wants to stay on Simva40. ~  FLP 2/10 showed TChol 176, Tg 160, HDL 39, LDL 105... rec> continue same. ~  FLP 4/11 showed TChol 160, TG 131,  HDL 41, LDL 93 ~  FLP 4/12 showed TChol 174, TG 148, HDL 43, LDL 102 ~  FLP 5/13 showed TChol 159, TG 151, HDL 47,  LDL 82 ~  FLP 5/14 on Simva40 showed TChol 165, TG 157, HDL 41, LDL 93 ~  FLP 11/15 on Simva40 showed TChol 169, TG 135, HDL 41, LDL 101  GERD (ICD-530.81) - on NEXIUM 40mg /d & ZANTAC 300mg Qhs... ~  EGD 6/07 w/ 3cmHH, stricture, gastitis (HPylori neg)... I offered to change her Nexium to a generic but she declined- "DrPatterson told me never to change this med"... she notes occas nocturnal reflux symptoms. ~  5/12: She had GI eval by DrPatterson 5/12> chr GERD, hx 3cmHH, prev stricture dilated, hx colon polyps, etc> c/o incr reflux symptoms, bloating, pressure despite her Nexium40AM & Zantac300PM;  Nexium was increased to Bid, Carafate added Qid & she had EGD 5/12 showing mod gastritis w/ bx= chr inflamm, neg HPylori... ~  CTAbd 5/12 showed mult parenchymal nodules at lung bases (some fractionally larger), calcif hilar nodes bilat, no renal lesions (?left upper pole mass seen on sonar?). ~  EGD 3/13 by DrPatterson showed 3cmHH, esophagitis, prob "pill espohagitis", dilated... Symptoms resolved w/ Carafate, Nexium, Zantac, Xylocaine... ~  5/14: on Nexium40Bid, Zantac300HS, CarafateQid prn, & Zofran prn; she remains asymptomatic w/o abd pain, n/v, c/d, blood seen... ~  11/15: on Nexium40, Carafate1GmQhs, & Zofran prn; she has seen DrPyrtle & remains asymptomatic on these meds- w/o abd pain, n/v, c/d, blood seen...  IRRITABLE BOWEL SYNDROME (ICD-564.1) COLONIC POLYPS, HX OF (ICD-V12.72)  ~  Colonoscopy 6/07 by DrPatterson showing divertics & polyps (adenomatous)... f/u planned 75yrs... ~  She had f/u colonoscopy 5/12 showing mod divertics, sessile polyp in cecum= serrated adenoma w/ f/u planned 3-74yrs...  UTI'S, CHRONIC (ICD-599.0) - eval by DrOttelin for urology... now off the Macrodantin and using cranberry juice without recurrent UTI, no problems...  DEGENERATIVE JOINT DISEASE (ICD-715.90) - eval by DrDalldorf w/ mod DJD in knees & hx of torn left meniscus... 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 overall improved now. ~  11/13:  TRAMADOL 50mg  Tid refilled for prn use...  LOW BACK PAIN, CHRONIC (ICD-724.2) ~  2/14: she fell while getting up at night & hit her back w/ severe pain & L1 compression fx- adm to [facility] for 2d w/ kyphoplasty & good pain relief...  OSTEOPENIA (ICD-733.90) - on Calcium, MVI, Vit D... ~  BMD in 2005 showed TScore -1.2 in Spine, & -0.7 in [facility]. ~  BMD 5/09 showed TScores -1.5 in Spine & -1.1 in [facility]. ~  BMD here 5/11 showed TScores -1.2 in Spine, and -1.2 in [facility]. ~  5/13 & 5/14:  She is due for f/u BMD but wants to wait for now... ~  Labs 11/15 showed VitD= 52...  ANXIETY (ICD-300.00)  Hx of BASAL CELL SKIN CANCER - she still sees her Derm in [place] yearly...  Health Maintenance: ~  GI:  followed by DrPatterson/Pyrtle w/ colon as above... ~  GYN:  followed by DrRichardson & doing well by pt's report... Mammograms at the [facility]... ~  Immunizations:  she gets yearly flu shots;  has PNEUMOVAX in 1998 & repeated 10/11 (age 80);  TDAP given 5/13;  we discussed Shingles vaccine (she had bout of shingles involving her right hip & leg ~age30) & she will decide.    Past Surgical History  Procedure  Laterality Date  . Vesicovaginal fistula closure w/ tah    . Basal cell carcinoma excision    . Cataract extraction      right  . Removal of mass from rt antecubital fossa    . Abdominal hysterectomy    . Upper gastrointestinal endoscopy    . Colonoscopy  2012    Outpatient Encounter Prescriptions as of 06/07/2014  Medication Sig  . aspirin 81 MG tablet Take 81 mg by mouth daily.    . B Complex Vitamins (VITAMIN-B COMPLEX PO) Take 1 capsule by mouth daily.   . Calcium Carbonate (CALTRATE 600 PO) Take 1 tablet by mouth 2 (two) times daily.    . Cholecalciferol (VITAMIN D) 1000 UNITS capsule Take 1,000 Units by mouth daily.    [name]  esomeprazole (NEXIUM) 40 MG capsule Take 1 capsule (40 mg total) by mouth 2 (two) times daily before a meal.  . Glucosamine HCl (GLUCOSAMINE PO) Liquid form - Take once daily  . meclizine (ANTIVERT) 25 MG tablet Take 1/2-1 tablet by mouth every 4 hours as needed for dizziness  . Multiple Vitamins-Minerals (MULTIVITAL) tablet Take 1 tablet by mouth daily.    . Omega-3 Fatty Acids (FISH OIL) 1000 MG CAPS Take 1 capsule by mouth daily.    . ondansetron (ZOFRAN) 8 MG tablet 1 tablet by mouth every 6 hours as needed for nausea  . simvastatin (ZOCOR) 40 MG tablet Take 1 tablet (40 mg total) by mouth at bedtime.  . sucralfate (CARAFATE) 1 G tablet Take 1 tablet (1 g total) by mouth as needed.  . [DISCONTINUED] simvastatin (ZOCOR) 40 MG tablet Take 1 tablet (40 mg total) by mouth at bedtime.    Allergies  Allergen Reactions  . Prednisone     REACTION: unable to take this med due to an eye condition  . Amoxicillin-Pot Clavulanate     REACTION: causes diarrhea  . Codeine     REACTION: nausea  . Levaquin [Levofloxacin In D5w]     Had itching w/ intravenous, not sure if reaction was from abx or the need to change the IV.  Does well when taken with Benadryl.  . Tdap [Diphth-Acell Pertussis-Tetanus]     Area raised, warm to touch, tender, swollen, pt felt jittery, nausea and some SOB    Review of Systems         See HPI - all other systems neg except as noted... The patient denies anorexia, fever, weight loss, weight gain, vision loss, decreased hearing, hoarseness, chest pain, syncope, dyspnea on exertion, peripheral edema, prolonged cough, headaches, hemoptysis, abdominal pain, melena, hematochezia, severe indigestion/heartburn, hematuria, incontinence, muscle weakness, suspicious skin lesions, transient blindness, difficulty walking, depression, unusual weight change, abnormal bleeding, enlarged lymph nodes, and angioedema.     Objective:   Physical Exam    WD, WN, 73 y/o WF in  [place]... GENERAL:  Alert & oriented; pleasant & cooperative... HEENT:  [name]/AT, EOM-wnl, PERRLA, EACs-clear, TMs-wnl, NOSE-clear, THROAT-clear & wnl. NECK:  Supple w/ fairROM; no JVD; normal carotid impulses w/o bruits; no thyromegaly or nodules palpated; no lymphadenopathy. CHEST:  Clear to P & A; without wheezes/ rales/ or rhonchi. HEART:  Regular Rhythm; without murmurs/ rubs/ or gallops. ABDOMEN:  Soft & nontender; normal bowel sounds; no organomegaly or masses detected. EXT: without deformities, mild arthritic changes; no varicose veins/ +venous insuffic/ no edema. NEURO:  CN's intact;  no focal neuro deficits... DERM:  No lesions noted; no rash etc...  RADIOLOGY DATA:  Reviewed in the EPIC  EMR & discussed w/ the patient...  LABORATORY DATA:  Reviewed in the EPIC EMR & discussed w/ the patient...   Assessment & Plan:    DYSPNEA w/ thorough PULM & CARDIAC eval>> underlying sarcoidosis w/ mild pulm fibrosis, no signs of dis activity, remains on watchful waiting protocol; Cards eval by DrTaylor was neg as well; she has improved on a gradual exercise program...  SARCOID>  CXR/ PFT/ ACE stable & she remains asymptomatic, no sigs of dis activity...  CHOL>  Stable on the Simva40 + diet...  GERD>  On Nexium & Zantac, she is currently improved from her prev symptoms...  DJD/ LBP/ Osteopenia>  As noted she remains stable on current meds & exercise program; she will see DrDaldorf about her ankle pain...  Compression fx L1 after fall> she is s/p L1 kyphoplasty...  Other medical issues as noted...   Patient's Medications  New Prescriptions   No medications on file  Previous Medications   ASPIRIN 81 MG TABLET    Take 81 mg by mouth daily.     B COMPLEX VITAMINS (VITAMIN-B COMPLEX PO)    Take 1 capsule by mouth daily.    CALCIUM CARBONATE (CALTRATE 600 PO)    Take 1 tablet by mouth 2 (two) times daily.     CHOLECALCIFEROL (VITAMIN D) 1000 UNITS CAPSULE    Take 1,000 Units by mouth daily.      ESOMEPRAZOLE (NEXIUM) 40 MG CAPSULE    Take 1 capsule (40 mg total) by mouth 2 (two) times daily before a meal.   GLUCOSAMINE HCL (GLUCOSAMINE PO)    Liquid form - Take once daily   MECLIZINE (ANTIVERT) 25 MG TABLET    Take 1/2-1 tablet by mouth every 4 hours as needed for dizziness   MULTIPLE VITAMINS-MINERALS (MULTIVITAL) TABLET    Take 1 tablet by mouth daily.     OMEGA-3 FATTY ACIDS (FISH OIL) 1000 MG CAPS    Take 1 capsule by mouth daily.     ONDANSETRON (ZOFRAN) 8 MG TABLET    1 tablet by mouth every 6 hours as needed for nausea   SUCRALFATE (CARAFATE) 1 G TABLET    Take 1 tablet (1 g total) by mouth as needed.  Modified Medications   Modified Medication Previous Medication   SIMVASTATIN (ZOCOR) 40 MG TABLET simvastatin (ZOCOR) 40 MG tablet      Take 1 tablet (40 mg total) by mouth at bedtime.    Take 1 tablet (40 mg total) by mouth at bedtime.  Discontinued Medications   No medications on file

## 2014-06-09 ENCOUNTER — Ambulatory Visit: Payer: Medicare Other | Admitting: Pulmonary Disease

## 2014-07-05 ENCOUNTER — Encounter: Payer: Self-pay | Admitting: *Deleted

## 2014-07-06 ENCOUNTER — Ambulatory Visit (INDEPENDENT_AMBULATORY_CARE_PROVIDER_SITE_OTHER): Payer: Medicare Other | Admitting: Internal Medicine

## 2014-07-06 ENCOUNTER — Encounter: Payer: Self-pay | Admitting: Internal Medicine

## 2014-07-06 VITALS — BP 134/80 | HR 80 | Ht 68.0 in | Wt 176.0 lb

## 2014-07-06 DIAGNOSIS — Z86010 Personal history of colonic polyps: Secondary | ICD-10-CM

## 2014-07-06 DIAGNOSIS — K219 Gastro-esophageal reflux disease without esophagitis: Secondary | ICD-10-CM

## 2014-07-06 MED ORDER — SUCRALFATE 1 GM/10ML PO SUSP: 1.0000 g | Freq: Two times a day (BID) | ORAL | Status: DC

## 2014-07-06 MED ORDER — NEXIUM 40 MG PO CPDR: 40.0000 mg | DELAYED_RELEASE_CAPSULE | Freq: Two times a day (BID) | ORAL | Status: DC

## 2014-07-06 NOTE — Patient Instructions (Signed)
We have sent in your generic Nexium, call in a few weeks to give an update on your symptoms. We have also sent in Carafate. Follow up in 6 months with Dr Hilarie Fredrickson CC:  Laura Lower MD

## 2014-07-07 ENCOUNTER — Encounter: Payer: Self-pay | Admitting: Internal Medicine

## 2014-07-07 NOTE — Progress Notes (Signed)
Patient ID: Laura [name], female   DOB: 10-08-1940, 73 y.o.   MRN: [id number]     History of Present Illness:   This is a follow-up for this delightful 73 year old female who has a history of GERD with esophagitis, IBS, adenomatous colon polyps, DJD, hyperlipidemia, and sarcoidosis. She was last seen here in June at which time her Nexium was increased to twice a day. She is here for follow-up today and says that she is feeling well. She was able to decrease her Nexium back to once daily. She occasionally will use Carafate at bedtime. She has no epigastric pain, nausea, vomiting, or dysphagia. Her appetite as been good and her weight has been stable. Her bowel movements are regular without blood or melena.   Past Medical History  Diagnosis Date  . Uveitis   . Sarcoidosis   . MVP (mitral valve prolapse)   . Hypercholesterolemia   . GERD (gastroesophageal reflux disease)   . IBS (irritable bowel syndrome)   . Personal history of colonic polyps     SESSILE SERRATED ADENOMAS (X2)  . Urinary tract infection, site not specified   . Osteopenia   . Anxiety   . DJD (degenerative joint disease)   . Diverticulosis of colon (without mention of hemorrhage)   . Hiatal hernia   . Stricture and stenosis of esophagus   . Unspecified gastritis and gastroduodenitis without mention of hemorrhage   . Allergy     SEASONAL  . Colon polyps     Past Surgical History  Procedure Laterality Date  . Vesicovaginal fistula closure w/ tah    . Basal cell carcinoma excision    . Cataract extraction      right  . Removal of mass from rt antecubital fossa    . Abdominal hysterectomy    . Upper gastrointestinal endoscopy    . Colonoscopy  2012   Family History  Problem Relation Age of Onset  . Sarcoidosis Sister   . Sarcoidosis Brother   . Other      nephew with Wegener's   . Cancer Mother     ? type  . Colon cancer Sister   . Stroke Sister    History  Substance Use Topics  . Smoking status: Never  Smoker   . Smokeless tobacco: Never Used  . Alcohol Use: 0.6 oz/week    1 Glasses of wine per week     Comment: occ wine    Current Outpatient Prescriptions  Medication Sig Dispense Refill  . aspirin 81 MG tablet Take 81 mg by mouth daily.      . B Complex Vitamins (VITAMIN-B COMPLEX PO) Take 1 capsule by mouth daily.     . Calcium Carbonate (CALTRATE 600 PO) Take 1 tablet by mouth 2 (two) times daily.      . Cholecalciferol (VITAMIN D) 1000 UNITS capsule Take 1,000 Units by mouth daily.      . Glucosamine HCl (GLUCOSAMINE PO) Liquid form - Take once daily    . meclizine (ANTIVERT) 25 MG tablet Take 1/2-1 tablet by mouth every 4 hours as needed for dizziness    . Multiple Vitamins-Minerals (MULTIVITAL) tablet Take 1 tablet by mouth daily.      [name] NEXIUM 40 MG capsule Take 1 capsule (40 mg total) by mouth 2 (two) times daily before a meal. 60 capsule 6  . Omega-3 Fatty Acids (FISH OIL) 1000 MG CAPS Take 1 capsule by mouth daily.      . ondansetron (ZOFRAN) 8  MG tablet 1 tablet by mouth every 6 hours as needed for nausea 25 tablet 5  . simvastatin (ZOCOR) 40 MG tablet Take 1 tablet (40 mg total) by mouth at bedtime. 90 tablet 3  . sucralfate (CARAFATE) 1 G tablet Take 1 tablet (1 g total) by mouth as needed. 30 tablet 6  . sucralfate (CARAFATE) 1 GM/10ML suspension Take 10 mLs (1 g total) by mouth 2 (two) times daily. 420 mL 11   No current facility-administered medications for this visit.   Allergies  Allergen Reactions  . Prednisone     REACTION: unable to take this med due to an eye condition  . Amoxicillin-Pot Clavulanate     REACTION: causes diarrhea  . Codeine     REACTION: nausea  . Levaquin [Levofloxacin In D5w]     Had itching w/ intravenous, not sure if reaction was from abx or the need to change the IV.  Does well when taken with Benadryl.  . Tdap [Diphth-Acell Pertussis-Tetanus]     Area raised, warm to touch, tender, swollen, pt felt jittery, nausea and some SOB       Review of Systems: Gen: Denies any fever, chills, sweats, anorexia, fatigue, weakness, malaise, weight loss, and sleep disorder CV: Denies chest pain, angina, palpitations, syncope, orthopnea, PND, peripheral edema, and claudication. Resp: Denies dyspnea at rest, dyspnea with exercise, cough, sputum, wheezing, coughing up blood, and pleurisy. GI: Denies vomiting blood, jaundice, and fecal incontinence.   Denies dysphagia or odynophagia. GU : Denies urinary burning, blood in urine, urinary frequency, urinary hesitancy, nocturnal urination, and urinary incontinence. MS: Denies joint pain, limitation of movement, and swelling, stiffness, low back pain, extremity pain. Denies muscle weakness, cramps, atrophy.  Derm: Denies rash, itching, dry skin, hives, moles, warts, or unhealing ulcers.  Psych: Denies depression, anxiety, memory loss, suicidal ideation, hallucinations, paranoia, and confusion. Heme: Denies bruising, bleeding, and enlarged lymph nodes. Neuro:  Denies any headaches, dizziness, paresthesia Endo:  Denies any problems with DM, thyroid, adrenal     Physical Exam: General: Pleasant, well developed female in no acute distress Head: Normocephalic and atraumatic Eyes:  sclerae anicteric, conjunctiva pink  Ears: Normal auditory acuity Lungs: Clear throughout to auscultation Heart: Regular rate and rhythm Abdomen: Soft, non distended, non-tender. No masses, no hepatomegaly. Normal bowel sounds Musculoskeletal: Symmetrical with no gross deformities  Extremities: No edema  Neurological: Alert oriented x 4, grossly nonfocal Psychological:  Alert and cooperative. Normal mood and affect  Assessment and Recommendations: #1. GERD she will continue Nexium 40 mg daily and when necessary use of Carafate. She will return to clinic in 6 months.   #2 history of adenomatous polyps. She will be due for surveillance colonoscopy in 2017.  Nozomi Mettler, Vita Barley PA-C  07/07/2014,  Addendum: Reviewed and agree with ongoing management. Jerene Bears, MD

## 2014-07-26 ENCOUNTER — Telehealth: Payer: Self-pay | Admitting: Internal Medicine

## 2014-07-26 ENCOUNTER — Encounter: Payer: Self-pay | Admitting: Cardiology

## 2014-07-26 ENCOUNTER — Ambulatory Visit (INDEPENDENT_AMBULATORY_CARE_PROVIDER_SITE_OTHER): Payer: Medicare Other | Admitting: Cardiology

## 2014-07-26 VITALS — BP 132/82 | HR 85 | Ht 68.0 in | Wt 176.6 lb

## 2014-07-26 DIAGNOSIS — I4901 Ventricular fibrillation: Secondary | ICD-10-CM | POA: Diagnosis not present

## 2014-07-26 DIAGNOSIS — D869 Sarcoidosis, unspecified: Secondary | ICD-10-CM

## 2014-07-26 DIAGNOSIS — R002 Palpitations: Secondary | ICD-10-CM | POA: Diagnosis not present

## 2014-07-26 DIAGNOSIS — I498 Other specified cardiac arrhythmias: Secondary | ICD-10-CM

## 2014-07-26 LAB — BASIC METABOLIC PANEL
BUN: 16 mg/dL (ref 6–23)
CO2: 30 mEq/L (ref 19–32)
Calcium: 9.2 mg/dL (ref 8.4–10.5)
Chloride: 103 mEq/L (ref 96–112)
Creatinine, Ser: 0.7 mg/dL (ref 0.4–1.2)
GFR: 85.53 mL/min (ref >60.00)
GLUCOSE: 84 mg/dL (ref 70–99)
POTASSIUM: 4 meq/L (ref 3.5–5.1)
Sodium: 141 mEq/L (ref 135–145)

## 2014-07-26 NOTE — Progress Notes (Signed)
07/26/2014   PCP: Noralee Space, MD   Chief Complaint  Patient presents with  . Palpitations    pt. states happening more often.   . Dizziness    Primary Cardiologist:Dr. Beckie Salts   HPI:  74 yo woman with a h/o unexplained dyspnea, who was referred by Dr. Lenna Gilford for evaluation of shortness of breath in March of this year.  She saw  Dr. Beckie Salts.  The patient has subsequently undergone 2D echo which demonstrated normal LV function, normal valve function and no evidence of pulmonary HTN. She then underwent stress testing which demonstrated no evidence of ischemia. She had a fairly poor functional capacity. She admits to a very sedentary lifestyle.  She is seen today as call if for palpitations and dizziness.  Palpitations described as skipped beats that cause fluttering feeling, causing anxiousness, dizziness and headache, she feels sleepy.  At times she feels like she "may go out", but she clarifys that it only last for a second or so.  She has never had syncope or near syncope.  She has had these symptoms at times for most of her life that last a day or so then go away.  Now they have been present for 3 weeks and have made her uncomfortable.  Remote hx of MVP with echo in 1999 showing flat closure but no frank prolapse...per Dr. Jeannine Kitten notes.  Most recent echo as above.  She may have at most one cup of caffeine per day.  No OTC cold meds or herbal meds.  No chest pain or SOB.  Her sisters do have atrial fib.    Allergies  Allergen Reactions  . Prednisone     REACTION: unable to take this med due to an eye condition  . Amoxicillin-Pot Clavulanate     REACTION: causes diarrhea  . Codeine     REACTION: nausea  . Levaquin [Levofloxacin In D5w]     Had itching w/ intravenous, not sure if reaction was from abx or the need to change the IV.  Does well when taken with Benadryl.  . Tdap [Diphth-Acell Pertussis-Tetanus]     Area raised, warm to touch, tender, swollen, pt felt  jittery, nausea and some SOB    Current Outpatient Prescriptions  Medication Sig Dispense Refill  . aspirin 81 MG tablet Take 81 mg by mouth daily.      . B Complex Vitamins (VITAMIN-B COMPLEX PO) Take 1 capsule by mouth daily.     . Calcium Carbonate (CALTRATE 600 PO) Take 1 tablet by mouth 2 (two) times daily.      . Cholecalciferol (VITAMIN D) 1000 UNITS capsule Take 1,000 Units by mouth daily.      . Glucosamine HCl (GLUCOSAMINE PO) Liquid form - Take once daily    . meclizine (ANTIVERT) 25 MG tablet Take 1/2-1 tablet by mouth every 4 hours as needed for dizziness    . Multiple Vitamins-Minerals (MULTIVITAL) tablet Take 1 tablet by mouth daily.      [name] NEXIUM 40 MG capsule Take 1 capsule (40 mg total) by mouth 2 (two) times daily before a meal. 60 capsule 6  . Omega-3 Fatty Acids (FISH OIL) 1000 MG CAPS Take 1 capsule by mouth daily.      . ondansetron (ZOFRAN) 8 MG tablet 1 tablet by mouth every 6 hours as needed for nausea 25 tablet 5  . simvastatin (ZOCOR) 40 MG tablet Take 1 tablet (40 mg total) by  mouth at bedtime. 90 tablet 3  . sucralfate (CARAFATE) 1 G tablet Take 1 tablet (1 g total) by mouth as needed. 30 tablet 6   No current facility-administered medications for this visit.    Past Medical History  Diagnosis Date  . Uveitis   . Sarcoidosis   . MVP (mitral valve prolapse)   . Hypercholesterolemia   . GERD (gastroesophageal reflux disease)   . IBS (irritable bowel syndrome)   . Personal history of colonic polyps     SESSILE SERRATED ADENOMAS (X2)  . Urinary tract infection, site not specified   . Osteopenia   . Anxiety   . DJD (degenerative joint disease)   . Diverticulosis of colon (without mention of hemorrhage)   . Hiatal hernia   . Stricture and stenosis of esophagus   . Unspecified gastritis and gastroduodenitis without mention of hemorrhage   . Allergy     SEASONAL  . Colon polyps     Past Surgical History  Procedure Laterality Date  . Vesicovaginal  fistula closure w/ tah    . Basal cell carcinoma excision    . Cataract extraction      right  . Removal of mass from rt antecubital fossa    . Abdominal hysterectomy    . Upper gastrointestinal endoscopy    . Colonoscopy  2012    LZJ:QBHALPF:XT colds or fevers, no weight changes Skin:no rashes or ulcers HEENT:no blurred vision, no congestion CV:see HPI PUL:see HPI GI:no diarrhea constipation or melena, controlled reflux.  GU:no hematuria, no dysuria MS:no joint pain, no claudication Neuro:no syncope, no lightheadedness Endo:no diabetes, no thyroid disease TSH normal in Nov.  Wt Readings from Last 3 Encounters:  07/26/14 176 lb 9.6 oz (80.105 kg)  07/06/14 176 lb (79.833 kg)  06/07/14 179 lb (81.194 kg)    PHYSICAL EXAM BP 132/82 mmHg  Pulse 85  Ht 5\' 8"  (1.727 m)  Wt 176 lb 9.6 oz (80.105 kg)  BMI 26.86 kg/m2 General:Pleasant affect, NAD Skin:Warm and dry, brisk capillary refill HEENT:normocephalic, sclera clear, mucus membranes moist Neck:supple, no JVD, no bruits  Heart:S1S2 RRR without murmur, gallup, rub or click Lungs:clear without rales, rhonchi, or wheezes KWI:OXBD, non tender, + BS, do not palpate liver spleen or masses Ext:no lower ext edema, 2+ pedal pulses, 2+ radial pulses Neuro:alert and oriented X 3, MAE, follows commands, + facial symmetry  EKG:SR no acute changes from previous.    ASSESSMENT AND PLAN Heart palpitations Hx of palpitations, for years but now not going away, never documented.  Plan for 2 week event monitor to eval.  Hx in 1999 of MVP, though recent echo was with normal valves.  Will defer to Dr. Beckie Salts for treatment plan once arrhthymias documented.  Per her description they seem premature beats.  We will check BMP, recent TSH was normal.  EKG is stable and recent normal echo and stress test.  She will follow up with Dr. Beckie Salts for results.  Though we will call if any abnormalities prior to that time.  Sarcoidosis Followed by Dr.  Lenna Gilford

## 2014-07-26 NOTE — Assessment & Plan Note (Signed)
Followed by Dr. Nadel.   

## 2014-07-26 NOTE — Telephone Encounter (Signed)
New message     Patient c/o Palpitations:  High priority if patient c/o lightheadedness and shortness of breath.  1. How long have you been having palpitations? Several days  2. Are you currently experiencing lightheadedness and shortness of breath? At times-------within the last hour having lightheadedness and sob  3. Have you checked your BP and heart rate? (document readings) no---do not have a bp kit  4. Are you experiencing any other symptoms? no

## 2014-07-26 NOTE — Patient Instructions (Signed)
Your physician recommends that you continue on your current medications as directed. Please refer to the Current Medication list given to you today.\   Your physician has recommended that you wear an event monitor. FOR 14 DAYS  Event monitors are medical devices that record the heart's electrical activity. Doctors most often [place] these monitors to diagnose arrhythmias. Arrhythmias are problems with the speed or rhythm of the heartbeat. The monitor is a small, portable device. You can wear one while you do your normal daily activities. This is usually used to diagnose what is causing palpitations/syncope (passing out).   Your physician recommends that you return for lab work in:  [place] 6 TO 8 WEEKS

## 2014-07-26 NOTE — Assessment & Plan Note (Addendum)
Hx of palpitations, for years but now not going away, never documented.  Plan for 2 week event monitor to eval.  Hx in 1999 of MVP, though recent echo was with normal valves.  Will defer to Dr. Beckie Salts for treatment plan once arrhthymias documented.  Per her description they seem premature beats.  We will check BMP, recent TSH was normal.  EKG is stable and recent normal echo and stress test.  She will follow up with Dr. Beckie Salts for results.  Though we will call if any abnormalities prior to that time.

## 2014-07-26 NOTE — Telephone Encounter (Signed)
Patient called about persistent fluttering and heart skipping beats the past two weeks. Patient complaining of SOB and lightheadedness. Patient has history of mitral valve prolapse. Echo in April 2015 EF was 55-60%. Patient would like to be seen by someone today. Patient denies any chest pain at this time. Discuss with Cecilie Kicks NP ([facility]) and she will see the patient. Appointment scheduled for 11:30. Patient agreed with this plan and verbalized understanding.

## 2014-07-27 ENCOUNTER — Telehealth: Payer: Self-pay | Admitting: Internal Medicine

## 2014-07-27 DIAGNOSIS — K219 Gastro-esophageal reflux disease without esophagitis: Secondary | ICD-10-CM

## 2014-07-27 NOTE — Telephone Encounter (Signed)
Dr Hilarie Fredrickson, okay with you?

## 2014-07-28 NOTE — Telephone Encounter (Signed)
Okay if effective for the patient

## 2014-07-28 NOTE — Telephone Encounter (Signed)
Left message for patient to call back  

## 2014-07-29 ENCOUNTER — Encounter (INDEPENDENT_AMBULATORY_CARE_PROVIDER_SITE_OTHER): Payer: Self-pay

## 2014-07-29 ENCOUNTER — Encounter (INDEPENDENT_AMBULATORY_CARE_PROVIDER_SITE_OTHER): Payer: Medicare Other

## 2014-07-29 ENCOUNTER — Encounter: Payer: Self-pay | Admitting: *Deleted

## 2014-07-29 ENCOUNTER — Telehealth: Payer: Self-pay | Admitting: *Deleted

## 2014-07-29 DIAGNOSIS — R002 Palpitations: Secondary | ICD-10-CM

## 2014-07-29 MED ORDER — SUCRALFATE 1 G PO TABS: 1.0000 g | ORAL_TABLET | ORAL | Status: DC | PRN

## 2014-07-29 MED ORDER — OMEPRAZOLE 40 MG PO CPDR: 40.0000 mg | DELAYED_RELEASE_CAPSULE | Freq: Two times a day (BID) | ORAL | Status: DC

## 2014-07-29 NOTE — Progress Notes (Signed)
Patient ID: Laura [name], female   DOB: 1941-07-01, 74 y.o.   MRN: [id number] [employer] 14 day cardiac event monitor applied to patient.

## 2014-07-29 NOTE — Telephone Encounter (Signed)
New rx's sent. Patient is advised and verbalizes understanding.

## 2014-08-05 ENCOUNTER — Inpatient Hospital Stay: Admission: RE | Admit: 2014-08-05 | Payer: Medicare Other | Source: Ambulatory Visit

## 2014-08-17 ENCOUNTER — Other Ambulatory Visit: Payer: Medicare Other

## 2014-09-07 ENCOUNTER — Ambulatory Visit (INDEPENDENT_AMBULATORY_CARE_PROVIDER_SITE_OTHER): Payer: Medicare Other | Admitting: Internal Medicine

## 2014-09-07 ENCOUNTER — Encounter: Payer: Self-pay | Admitting: Internal Medicine

## 2014-09-07 ENCOUNTER — Ambulatory Visit (INDEPENDENT_AMBULATORY_CARE_PROVIDER_SITE_OTHER)
Admission: RE | Admit: 2014-09-07 | Discharge: 2014-09-07 | Disposition: A | Discharge status: Home / Self Care | Payer: Medicare Other | Source: Ambulatory Visit | Attending: Pulmonary Disease | Admitting: Pulmonary Disease

## 2014-09-07 VITALS — BP 152/70 | HR 80 | Ht 68.5 in | Wt 178.4 lb

## 2014-09-07 DIAGNOSIS — M949 Disorder of cartilage, unspecified: Secondary | ICD-10-CM | POA: Diagnosis not present

## 2014-09-07 DIAGNOSIS — D869 Sarcoidosis, unspecified: Secondary | ICD-10-CM | POA: Diagnosis not present

## 2014-09-07 DIAGNOSIS — M159 Polyosteoarthritis, unspecified: Secondary | ICD-10-CM

## 2014-09-07 DIAGNOSIS — H5213 Myopia, bilateral: Secondary | ICD-10-CM | POA: Insufficient documentation

## 2014-09-07 DIAGNOSIS — R002 Palpitations: Secondary | ICD-10-CM

## 2014-09-07 DIAGNOSIS — R0600 Dyspnea, unspecified: Secondary | ICD-10-CM

## 2014-09-07 DIAGNOSIS — E7800 Pure hypercholesterolemia, unspecified: Secondary | ICD-10-CM

## 2014-09-07 DIAGNOSIS — S32000D Wedge compression fracture of unspecified lumbar vertebra, subsequent encounter for fracture with routine healing: Secondary | ICD-10-CM | POA: Diagnosis not present

## 2014-09-07 DIAGNOSIS — M899 Disorder of bone, unspecified: Secondary | ICD-10-CM

## 2014-09-07 DIAGNOSIS — M150 Primary generalized (osteo)arthritis: Secondary | ICD-10-CM | POA: Diagnosis not present

## 2014-09-07 DIAGNOSIS — H524 Presbyopia: Secondary | ICD-10-CM | POA: Insufficient documentation

## 2014-09-07 NOTE — Progress Notes (Signed)
HPI Laura [name] returns today for followup. She is a pleasant 74 yo woman with a h/o unexplained dyspnea, who was referred by Dr. Lenna [name] over a year ago for evaluation of shortness of breath. The patient has subsequently undergone 2D echo which demonstrated normal LV function, normal valve function and no evidence of pulmonary HTN. She then underwent stress testing which demonstrated no evidence of ischemia. She had a fairly poor functional capacity. She admits to a very sedentary lifestyle. She had palpitations and wore a cardiac monitor and this revealed PVC's and PAC's. The patient notes that for several weeks prior to wearing her monitor, her palpitations were much worse. Since she was a monitor, her palpitations have improved. Allergies  Allergen Reactions  . Prednisone     REACTION: unable to take this med due to an eye condition  . Amoxicillin-Pot Clavulanate     REACTION: causes diarrhea  . Codeine     REACTION: nausea  . Levaquin [Levofloxacin In D5w]     Had itching w/ intravenous, not sure if reaction was from abx or the need to change the IV.  Does well when taken with Benadryl.  . Tdap [Diphth-Acell Pertussis-Tetanus]     Area raised, warm to touch, tender, swollen, pt felt jittery, nausea and some SOB     Current Outpatient Prescriptions  Medication Sig Dispense Refill  . aspirin 81 MG tablet Take 81 mg by mouth daily.      . B Complex Vitamins (VITAMIN-B COMPLEX PO) Take 1 capsule by mouth daily.     . Calcium Carbonate (CALTRATE 600 PO) Take 1 tablet by mouth 2 (two) times daily.      . Cholecalciferol (VITAMIN D) 1000 UNITS capsule Take 1,000 Units by mouth daily.      . Glucosamine HCl (GLUCOSAMINE PO) Liquid form - Take by mouth as directed once daily    . meclizine (ANTIVERT) 25 MG tablet Take 1/2-1 tablet by mouth every 4 hours as needed for dizziness    . Multiple Vitamins-Minerals (MULTIVITAL) tablet Take 1 tablet by mouth daily.      . Omega-3 Fatty Acids  (FISH OIL) 1000 MG CAPS Take 1 capsule by mouth daily.      [name] omeprazole (PRILOSEC) 40 MG capsule Take 1 capsule (40 mg total) by mouth 2 (two) times daily. 60 capsule 5  . ondansetron (ZOFRAN) 8 MG tablet 1 tablet by mouth every 6 hours as needed for nausea 25 tablet 5  . simvastatin (ZOCOR) 40 MG tablet Take 1 tablet (40 mg total) by mouth at bedtime. 90 tablet 3  . sucralfate (CARAFATE) 1 G tablet Take 1 tablet (1 g total) by mouth as needed. (Patient taking differently: Take 1 g by mouth daily as needed (stomach). ) 30 tablet 2   No current facility-administered medications for this visit.     Past Medical History  Diagnosis Date  . Uveitis   . Sarcoidosis   . MVP (mitral valve prolapse)   . Hypercholesterolemia   . GERD (gastroesophageal reflux disease)   . IBS (irritable bowel syndrome)   . Personal history of colonic polyps     SESSILE SERRATED ADENOMAS (X2)  . Urinary tract infection, site not specified   . Osteopenia   . Anxiety   . DJD (degenerative joint disease)   . Diverticulosis of colon (without mention of hemorrhage)   . Hiatal hernia   . Stricture and stenosis of esophagus   . Unspecified gastritis and gastroduodenitis  without mention of hemorrhage   . Allergy     SEASONAL  . Colon polyps     ROS:   All systems reviewed and negative except as noted in the HPI.   Past Surgical History  Procedure Laterality Date  . Vesicovaginal fistula closure w/ tah    . Basal cell carcinoma excision    . Cataract extraction      right  . Removal of mass from rt antecubital fossa    . Abdominal hysterectomy    . Upper gastrointestinal endoscopy    . Colonoscopy  2012     Family History  Problem Relation Age of Onset  . Sarcoidosis Sister   . Sarcoidosis Brother   . Other      nephew with Wegener's   . Cancer Mother     ? type  . Colon cancer Sister   . Stroke Sister      History   Social History  . Marital Status: Married    Spouse Name: Laura [name]  . Number of Children: 2  . Years of Education: N/A   Occupational History  . Retired     & Part Time Artist   Social History Main Topics  . Smoking status: Never Smoker   . Smokeless tobacco: Never Used  . Alcohol Use: 0.6 oz/week    1 Glasses of wine per week     Comment: occ wine   . Drug Use: No  . Sexual Activity: Not on file   Other Topics Concern  . Not on file   Social History Narrative     BP 152/70 mmHg  Pulse 80  Ht 5' 8.5" (1.74 m)  Wt 178 lb 6.4 oz (80.922 kg)  BMI 26.73 kg/m2  Physical Exam:  Well appearing 74 yo woman, NAD HEENT: Unremarkable Neck:  6 cm JVD, no thyromegally Back:  No CVA tenderness Lungs:  Clear with no wheezes HEART:  Regular rate rhythm, no murmurs, no rubs, no clicks Abd:  soft, positive bowel sounds, no organomegally, no rebound, no guarding Ext:  2 plus pulses, no edema, no cyanosis, no clubbing Skin:  No rashes no nodules Neuro:  CN II through XII intact, motor grossly intact  Cardiac monitor: Predominantly sinus rhythm with frequent PVCs, occasional PACs, but no sustained arrhythmias.  Assess/Plan:

## 2014-09-07 NOTE — Patient Instructions (Signed)
Your physician wants you to follow-up in: 3-4 months with Dr Knox Saliva will receive a reminder letter in the mail two months in advance. If you don't receive a letter, please call our office to schedule the follow-up appointment.

## 2014-09-08 DIAGNOSIS — H524 Presbyopia: Secondary | ICD-10-CM | POA: Diagnosis not present

## 2014-09-08 DIAGNOSIS — H02839 Dermatochalasis of unspecified eye, unspecified eyelid: Secondary | ICD-10-CM | POA: Diagnosis not present

## 2014-09-08 DIAGNOSIS — H5213 Myopia, bilateral: Secondary | ICD-10-CM | POA: Diagnosis not present

## 2014-09-08 DIAGNOSIS — H11823 Conjunctivochalasis, bilateral: Secondary | ICD-10-CM | POA: Diagnosis not present

## 2014-09-08 DIAGNOSIS — H35373 Puckering of macula, bilateral: Secondary | ICD-10-CM | POA: Diagnosis not present

## 2014-09-08 DIAGNOSIS — H43813 Vitreous degeneration, bilateral: Secondary | ICD-10-CM | POA: Diagnosis not present

## 2014-09-08 DIAGNOSIS — H52203 Unspecified astigmatism, bilateral: Secondary | ICD-10-CM | POA: Diagnosis not present

## 2014-09-08 DIAGNOSIS — H40043 Steroid responder, bilateral: Secondary | ICD-10-CM | POA: Diagnosis not present

## 2014-09-08 DIAGNOSIS — Z961 Presence of intraocular lens: Secondary | ICD-10-CM | POA: Diagnosis not present

## 2014-09-08 NOTE — Assessment & Plan Note (Signed)
Based on her echo, as well as her ECG, there is no evidence of cardiac sarcoid at this time. She will undergo watchful waiting.

## 2014-09-08 NOTE — Assessment & Plan Note (Signed)
Her dyspnea has improved since I saw her several months ago. At that time she was quite sedentary. She is attempting to increase her physical activity.

## 2014-09-08 NOTE — Assessment & Plan Note (Signed)
She will maintain a low-fat diet and continue her statin therapy.

## 2014-09-08 NOTE — Assessment & Plan Note (Signed)
The patient remains symptomatic, but her symptoms have improved. She notes that during times of increased stress, her palpitations worsen. Today we discussed various treatment options including watchful waiting, and initiation of flecainide. We also discussed the possible initiation of other medications. For now, she wishes to hold off on medical therapy, and try to reduce her use of caffeine, and try to obtain adequate sleep.

## 2014-09-14 ENCOUNTER — Telehealth: Payer: Self-pay | Admitting: Pulmonary Disease

## 2014-09-14 ENCOUNTER — Other Ambulatory Visit: Payer: Self-pay | Admitting: *Deleted

## 2014-09-14 DIAGNOSIS — R2232 Localized swelling, mass and lump, left upper limb: Secondary | ICD-10-CM

## 2014-09-14 MED ORDER — ALENDRONATE SODIUM 70 MG PO TABS: 70.0000 mg | ORAL_TABLET | ORAL | Status: DC

## 2014-09-14 NOTE — Telephone Encounter (Signed)
Pt states that when she seen Dr Lenna Gilford in 05/2014 she had a marble sized know near her left elbow.  She denies pain at the site but states that Dr Lenna Gilford had mentioned that it was near a nerve and she may want to have it removed.  She wants to know if Dr Lenna Gilford will recommend someone and does she need to make her own appt.  Please advise.

## 2014-09-14 NOTE — Telephone Encounter (Signed)
Spoke with pt, she is aware of SN's recs.  Referral placed.  Nothing further needed.

## 2014-09-14 NOTE — Telephone Encounter (Signed)
Per Dr. Lenna Gilford: Refer to ortho, [facility] - Dr. Daylene Katayama or Dr. Sedonia Small.

## 2014-09-20 DIAGNOSIS — D2112 Benign neoplasm of connective and other soft tissue of left upper limb, including shoulder: Secondary | ICD-10-CM | POA: Diagnosis not present

## 2014-09-21 ENCOUNTER — Other Ambulatory Visit: Payer: Self-pay | Admitting: Orthopedic Surgery

## 2014-09-23 ENCOUNTER — Telehealth: Payer: Self-pay | Admitting: Internal Medicine

## 2014-09-23 DIAGNOSIS — K219 Gastro-esophageal reflux disease without esophagitis: Secondary | ICD-10-CM

## 2014-09-23 MED ORDER — SUCRALFATE 1 G PO TABS: 1.0000 g | ORAL_TABLET | Freq: Two times a day (BID) | ORAL | Status: DC

## 2014-09-23 NOTE — Telephone Encounter (Signed)
Patient wants to know if it will be okay for her script of sucralfate to read as twice daily dosing rather than as necessary. She states she has been on 2 times daily for years and does not do well if she doesn't take this twice daily.Laura KitchenMarland [name]

## 2014-09-23 NOTE — Telephone Encounter (Signed)
Yes, okay for BID

## 2014-09-23 NOTE — Telephone Encounter (Signed)
I have left a voicemail for patient with Dr Vena Rua recommendations. New rx sent to pharmacy. Patient to follow up in 3 more months.

## 2014-09-25 DIAGNOSIS — M1711 Unilateral primary osteoarthritis, right knee: Secondary | ICD-10-CM | POA: Diagnosis not present

## 2014-10-27 ENCOUNTER — Telehealth: Payer: Self-pay | Admitting: Pulmonary Disease

## 2014-10-27 ENCOUNTER — Encounter (HOSPITAL_BASED_OUTPATIENT_CLINIC_OR_DEPARTMENT_OTHER): Payer: Self-pay | Admitting: *Deleted

## 2014-10-27 MED ORDER — CIPROFLOXACIN HCL 250 MG PO TABS: 250.0000 mg | ORAL_TABLET | Freq: Two times a day (BID) | ORAL | Status: DC

## 2014-10-27 NOTE — Telephone Encounter (Signed)
Spoke with pt, c/o burning with urination, frequent urination, lower back aching Xfew days.  Pt believes this is a UTI.   Pt uses [facility] in [place].  Last ov: 06/07/14 Next ov: 12/06/14  Dr. Lenna Gilford please advise on recs.  Thanks!  Allergies  Allergen Reactions  . Prednisone     REACTION: unable to take this med due to an eye condition  . Amoxicillin-Pot Clavulanate     REACTION: causes diarrhea  . Codeine     REACTION: nausea  . Levaquin [Levofloxacin In D5w]     Had itching w/ intravenous, not sure if reaction was from abx or the need to change the IV.  Does well when taken with Benadryl.  . Tdap [Diphth-Acell Pertussis-Tetanus]     Area raised, warm to touch, tender, swollen, pt felt jittery, nausea and some SOB   Current Outpatient Prescriptions on File Prior to Visit  Medication Sig Dispense Refill  . alendronate (FOSAMAX) 70 MG tablet Take 1 tablet (70 mg total) by mouth once a week. Take with a full glass of water on an empty stomach. 4 tablet 6  . aspirin 81 MG tablet Take 81 mg by mouth daily.      . B Complex Vitamins (VITAMIN-B COMPLEX PO) Take 1 capsule by mouth daily.     . Calcium Carbonate (CALTRATE 600 PO) Take 1 tablet by mouth 2 (two) times daily.      . Cholecalciferol (VITAMIN D) 1000 UNITS capsule Take 1,000 Units by mouth daily.      . Glucosamine HCl (GLUCOSAMINE PO) Liquid form - Take by mouth as directed once daily    . meclizine (ANTIVERT) 25 MG tablet Take 1/2-1 tablet by mouth every 4 hours as needed for dizziness    . Multiple Vitamins-Minerals (MULTIVITAL) tablet Take 1 tablet by mouth daily.      . Omega-3 Fatty Acids (FISH OIL) 1000 MG CAPS Take 1 capsule by mouth daily.      [name] omeprazole (PRILOSEC) 40 MG capsule Take 1 capsule (40 mg total) by mouth 2 (two) times daily. 60 capsule 5  . ondansetron (ZOFRAN) 8 MG tablet 1 tablet by mouth every 6 hours as needed for nausea 25 tablet 5  . simvastatin (ZOCOR) 40 MG tablet Take 1 tablet (40 mg total) by  mouth at bedtime. 90 tablet 3  . sucralfate (CARAFATE) 1 G tablet Take 1 tablet (1 g total) by mouth 2 (two) times daily. 60 tablet 2   No current facility-administered medications on file prior to visit.

## 2014-10-27 NOTE — Telephone Encounter (Signed)
Per SN, cipro 25mg  po BID #[id number] called into pt pharmacy. Pt was called and notified that mediation was being called in. Nothing further needed at this time.

## 2014-10-27 NOTE — Progress Notes (Signed)
Being tx uti- No labs needed-had rt elbow done here yr ago

## 2014-11-02 ENCOUNTER — Encounter (HOSPITAL_BASED_OUTPATIENT_CLINIC_OR_DEPARTMENT_OTHER): Payer: Self-pay | Admitting: Orthopedic Surgery

## 2014-11-02 ENCOUNTER — Ambulatory Visit (HOSPITAL_BASED_OUTPATIENT_CLINIC_OR_DEPARTMENT_OTHER): Payer: Medicare Other | Admitting: Anesthesiology

## 2014-11-02 ENCOUNTER — Encounter (HOSPITAL_BASED_OUTPATIENT_CLINIC_OR_DEPARTMENT_OTHER)
Admission: RE | Disposition: A | Discharge status: Home / Self Care | Payer: Self-pay | Source: Ambulatory Visit | Attending: Orthopedic Surgery

## 2014-11-02 ENCOUNTER — Ambulatory Visit (HOSPITAL_BASED_OUTPATIENT_CLINIC_OR_DEPARTMENT_OTHER)
Admission: RE | Admit: 2014-11-02 | Discharge: 2014-11-02 | Disposition: A | Discharge status: Home / Self Care | Payer: Medicare Other | Source: Ambulatory Visit | Attending: Orthopedic Surgery | Admitting: Orthopedic Surgery

## 2014-11-02 DIAGNOSIS — R2232 Localized swelling, mass and lump, left upper limb: Secondary | ICD-10-CM | POA: Diagnosis not present

## 2014-11-02 DIAGNOSIS — M7989 Other specified soft tissue disorders: Secondary | ICD-10-CM | POA: Diagnosis not present

## 2014-11-02 DIAGNOSIS — K219 Gastro-esophageal reflux disease without esophagitis: Secondary | ICD-10-CM | POA: Insufficient documentation

## 2014-11-02 DIAGNOSIS — M1990 Unspecified osteoarthritis, unspecified site: Secondary | ICD-10-CM | POA: Insufficient documentation

## 2014-11-02 DIAGNOSIS — Z7982 Long term (current) use of aspirin: Secondary | ICD-10-CM | POA: Insufficient documentation

## 2014-11-02 DIAGNOSIS — K449 Diaphragmatic hernia without obstruction or gangrene: Secondary | ICD-10-CM | POA: Insufficient documentation

## 2014-11-02 DIAGNOSIS — Z79899 Other long term (current) drug therapy: Secondary | ICD-10-CM | POA: Insufficient documentation

## 2014-11-02 DIAGNOSIS — E780 Pure hypercholesterolemia: Secondary | ICD-10-CM | POA: Insufficient documentation

## 2014-11-02 DIAGNOSIS — D869 Sarcoidosis, unspecified: Secondary | ICD-10-CM | POA: Insufficient documentation

## 2014-11-02 DIAGNOSIS — H209 Unspecified iridocyclitis: Secondary | ICD-10-CM | POA: Insufficient documentation

## 2014-11-02 DIAGNOSIS — D2112 Benign neoplasm of connective and other soft tissue of left upper limb, including shoulder: Secondary | ICD-10-CM | POA: Diagnosis not present

## 2014-11-02 HISTORY — DX: Reserved for inherently not codable concepts without codable children: IMO0001

## 2014-11-02 HISTORY — DX: Other specified postprocedural states: Z98.890

## 2014-11-02 HISTORY — PX: MASS EXCISION: SHX2000

## 2014-11-02 HISTORY — DX: Other specified postprocedural states: R11.2

## 2014-11-02 HISTORY — DX: Cardiac arrhythmia, unspecified: I49.9

## 2014-11-02 LAB — POCT HEMOGLOBIN-HEMACUE: Hemoglobin: 15.2 g/dL — ABNORMAL HIGH (ref 12.0–15.0)

## 2014-11-02 SURGERY — EXCISION MASS
Anesthesia: Regional | Site: Elbow | Laterality: Left

## 2014-11-02 MED ORDER — TRAMADOL HCL 50 MG PO TABS
50.0000 mg | ORAL_TABLET | Freq: Four times a day (QID) | ORAL | Status: DC | PRN
Start: 2014-11-02 — End: 2014-12-28

## 2014-11-02 MED ORDER — MIDAZOLAM HCL 2 MG/2ML IJ SOLN
INTRAMUSCULAR | Status: AC
  Filled 2014-11-02: qty 2

## 2014-11-02 MED ORDER — BUPIVACAINE-EPINEPHRINE (PF) 0.5% -1:200000 IJ SOLN
INTRAMUSCULAR | Status: AC
  Filled 2014-11-02: qty 30

## 2014-11-02 MED ORDER — CHLORHEXIDINE GLUCONATE 4 % EX LIQD: 60.0000 mL | Freq: Once | CUTANEOUS | Status: DC

## 2014-11-02 MED ORDER — SUCCINYLCHOLINE CHLORIDE 20 MG/ML IJ SOLN
INTRAMUSCULAR | Status: AC
  Filled 2014-11-02: qty 1

## 2014-11-02 MED ORDER — LACTATED RINGERS IV SOLN
INTRAVENOUS | Status: DC
  Administered 2014-11-02 (×2): via INTRAVENOUS

## 2014-11-02 MED ORDER — PROPOFOL 10 MG/ML IV EMUL
INTRAVENOUS | Status: AC
  Filled 2014-11-02: qty 150

## 2014-11-02 MED ORDER — PROPOFOL 10 MG/ML IV BOLUS
INTRAVENOUS | Status: DC | PRN
  Administered 2014-11-02 (×2): 20 mg via INTRAVENOUS

## 2014-11-02 MED ORDER — FENTANYL CITRATE 0.05 MG/ML IJ SOLN
INTRAMUSCULAR | Status: AC
  Filled 2014-11-02: qty 2

## 2014-11-02 MED ORDER — VANCOMYCIN HCL IN DEXTROSE 1-5 GM/200ML-% IV SOLN: 1000.0000 mg | INTRAVENOUS | Status: DC

## 2014-11-02 MED ORDER — FENTANYL CITRATE 0.05 MG/ML IJ SOLN
INTRAMUSCULAR | Status: AC
Start: 2014-11-02 — End: 2014-11-02
  Filled 2014-11-02: qty 6

## 2014-11-02 MED ORDER — BUPIVACAINE HCL (PF) 0.5 % IJ SOLN
INTRAMUSCULAR | Status: AC
  Filled 2014-11-02: qty 30

## 2014-11-02 MED ORDER — FENTANYL CITRATE 0.05 MG/ML IJ SOLN
50.0000 ug | INTRAMUSCULAR | Status: DC | PRN
  Administered 2014-11-02: 50 ug via INTRAVENOUS

## 2014-11-02 MED ORDER — OXYCODONE HCL 5 MG/5ML PO SOLN: 5.0000 mg | Freq: Once | ORAL | Status: DC | PRN

## 2014-11-02 MED ORDER — MIDAZOLAM HCL 2 MG/2ML IJ SOLN
1.0000 mg | INTRAMUSCULAR | Status: DC | PRN
  Administered 2014-11-02: 0.5 mg via INTRAVENOUS

## 2014-11-02 MED ORDER — BUPIVACAINE HCL (PF) 0.25 % IJ SOLN
INTRAMUSCULAR | Status: AC
  Filled 2014-11-02: qty 120

## 2014-11-02 MED ORDER — OXYCODONE HCL 5 MG PO TABS: 5.0000 mg | ORAL_TABLET | Freq: Once | ORAL | Status: DC | PRN

## 2014-11-02 MED ORDER — FENTANYL CITRATE 0.05 MG/ML IJ SOLN: 25.0000 ug | INTRAMUSCULAR | Status: DC | PRN

## 2014-11-02 MED ORDER — LIDOCAINE-EPINEPHRINE (PF) 1.5 %-1:200000 IJ SOLN
INTRAMUSCULAR | Status: DC | PRN
  Administered 2014-11-02: 25 mL via PERINEURAL

## 2014-11-02 SURGICAL SUPPLY — 48 items
BANDAGE COBAN STERILE 2 (GAUZE/BANDAGES/DRESSINGS) IMPLANT
BLADE SURG 15 STRL LF DISP TIS (BLADE) ×1 IMPLANT
BLADE SURG 15 STRL SS (BLADE) ×1
BNDG COHESIVE 1X5 TAN STRL LF (GAUZE/BANDAGES/DRESSINGS) IMPLANT
BNDG COHESIVE 3X5 TAN STRL LF (GAUZE/BANDAGES/DRESSINGS) IMPLANT
BNDG ESMARK 4X9 LF (GAUZE/BANDAGES/DRESSINGS) ×2 IMPLANT
BNDG GAUZE ELAST 4 BULKY (GAUZE/BANDAGES/DRESSINGS) IMPLANT
CHLORAPREP W/TINT 26ML (MISCELLANEOUS) ×2 IMPLANT
CORDS BIPOLAR (ELECTRODE) ×2 IMPLANT
COVER BACK TABLE 60X90IN (DRAPES) ×2 IMPLANT
COVER MAYO STAND STRL (DRAPES) ×2 IMPLANT
CUFF TOURNIQUET SINGLE 18IN (TOURNIQUET CUFF) ×2 IMPLANT
DECANTER SPIKE VIAL GLASS SM (MISCELLANEOUS) IMPLANT
DRAIN PENROSE 1/2X12 LTX STRL (WOUND CARE) IMPLANT
DRAPE EXTREMITY T 121X128X90 (DRAPE) ×2 IMPLANT
DRAPE SURG 17X23 STRL (DRAPES) ×2 IMPLANT
DRSG PAD ABDOMINAL 8X10 ST (GAUZE/BANDAGES/DRESSINGS) ×2 IMPLANT
GAUZE SPONGE 4X4 12PLY STRL (GAUZE/BANDAGES/DRESSINGS) ×2 IMPLANT
GAUZE XEROFORM 1X8 LF (GAUZE/BANDAGES/DRESSINGS) ×2 IMPLANT
GLOVE BIO SURGEON STRL SZ 6.5 (GLOVE) ×4 IMPLANT
GLOVE BIOGEL PI IND STRL 6.5 (GLOVE) ×1 IMPLANT
GLOVE BIOGEL PI IND STRL 7.0 (GLOVE) ×1 IMPLANT
GLOVE BIOGEL PI IND STRL 8.5 (GLOVE) ×1 IMPLANT
GLOVE BIOGEL PI INDICATOR 6.5 (GLOVE) ×1
GLOVE BIOGEL PI INDICATOR 7.0 (GLOVE) ×1
GLOVE BIOGEL PI INDICATOR 8.5 (GLOVE) ×1
GLOVE ECLIPSE 6.5 STRL STRAW (GLOVE) ×2 IMPLANT
GLOVE SURG ORTHO 8.0 STRL STRW (GLOVE) ×2 IMPLANT
GOWN STRL REUS W/ TWL LRG LVL3 (GOWN DISPOSABLE) ×2 IMPLANT
GOWN STRL REUS W/TWL LRG LVL3 (GOWN DISPOSABLE) ×2
GOWN STRL REUS W/TWL XL LVL3 (GOWN DISPOSABLE) ×2 IMPLANT
NDL SAFETY ECLIPSE 18X1.5 (NEEDLE) IMPLANT
NEEDLE HYPO 18GX1.5 SHARP (NEEDLE)
NEEDLE PRECISIONGLIDE 27X1.5 (NEEDLE) IMPLANT
NS IRRIG 1000ML POUR BTL (IV SOLUTION) ×2 IMPLANT
PACK BASIN DAY SURGERY FS (CUSTOM PROCEDURE TRAY) ×2 IMPLANT
PAD CAST 3X4 CTTN HI CHSV (CAST SUPPLIES) IMPLANT
PADDING CAST COTTON 3X4 STRL (CAST SUPPLIES)
SLING ARM FOAM STRAP LRG (SOFTGOODS) ×2 IMPLANT
SPLINT PLASTER CAST XFAST 3X15 (CAST SUPPLIES) IMPLANT
SPLINT PLASTER XTRA FASTSET 3X (CAST SUPPLIES)
STOCKINETTE 4X48 STRL (DRAPES) ×2 IMPLANT
SUT ETHILON 4 0 PS 2 18 (SUTURE) ×2 IMPLANT
SUT VIC AB 4-0 P2 18 (SUTURE) ×2 IMPLANT
SYR BULB 3OZ (MISCELLANEOUS) ×2 IMPLANT
SYR CONTROL 10ML LL (SYRINGE) IMPLANT
TOWEL OR 17X24 6PK STRL BLUE (TOWEL DISPOSABLE) ×2 IMPLANT
UNDERPAD 30X30 INCONTINENT (UNDERPADS AND DIAPERS) ×2 IMPLANT

## 2014-11-02 NOTE — Anesthesia Postprocedure Evaluation (Signed)
  Anesthesia Post-op Note  Patient: Laura [name]  Procedure(s) Performed: Procedure(s): EXCISION MASS LEFT ELBOW (Left)  Patient Location: PACU  Anesthesia Type:Regional  Level of Consciousness: awake and alert   Airway and Oxygen Therapy: Patient Spontanous Breathing  Post-op Pain: none  Post-op Assessment: Post-op Vital signs reviewed  Post-op Vital Signs: stable  Last Vitals:  Filed Vitals:   11/02/14 1009  BP: 162/72  Pulse: 75  Temp: 36.6 C  Resp: 18    Complications: No apparent anesthesia complications

## 2014-11-02 NOTE — Op Note (Signed)
Dictation Number [id number]

## 2014-11-02 NOTE — Anesthesia Preprocedure Evaluation (Addendum)
Anesthesia Evaluation  Patient identified by MRN, date of birth, ID band Patient awake    Reviewed: Allergy & Precautions  History of Anesthesia Complications (+) PONV  Airway Mallampati: II   Neck ROM: Full  Mouth opening: Limited Mouth Opening  Dental  (+) Teeth Intact, Partial Upper   Pulmonary shortness of breath,  breath sounds clear to auscultation        Cardiovascular + dysrhythmias Rhythm:Regular Rate:Normal     Neuro/Psych  Neuromuscular disease    GI/Hepatic hiatal hernia, GERD-  ,Irritable bowel    Endo/Other    Renal/GU      Musculoskeletal  (+) Arthritis -, Chronic back pain   Abdominal   Peds  Hematology   Anesthesia Other Findings   Reproductive/Obstetrics                            Anesthesia Physical Anesthesia Plan  ASA: III  Anesthesia Plan: Regional   Post-op Pain Management:    Induction:   Airway Management Planned: Simple Face Mask  Additional Equipment:   Intra-op Plan:   Post-operative Plan:   Informed Consent: I have reviewed the patients History and Physical, chart, labs and discussed the procedure including the risks, benefits and alternatives for the proposed anesthesia with the patient or authorized representative who has indicated his/her understanding and acceptance.     Plan Discussed with: CRNA and Surgeon  Anesthesia Plan Comments:        Anesthesia Quick Evaluation

## 2014-11-02 NOTE — Op Note (Signed)
NAMERogelio Seen NO.:  [id number]  MEDICAL RECORD NO.:  [id number]  LOCATION:                                 FACILITY:  PHYSICIAN:  Daryll Brod, M.D.            DATE OF BIRTH:  DATE OF PROCEDURE:  11/02/2014 DATE OF DISCHARGE:                              OPERATIVE REPORT   PREOPERATIVE DIAGNOSIS:  Mass, left elbow.  POSTOPERATIVE DIAGNOSIS:  Mass, left elbow.  OPERATION:  Excisional biopsy, mass, left elbow.  SURGEON:  Daryll Brod, M.D.  ASSISTANT:  Leanora Cover M.D.  ANESTHESIA:  Supraclavicular block with sedation.  ANESTHESIOLOGIST:  Ala Dach, M.D.  HISTORY:  The patient is a 74 year old female with a history of mass on the cubital tunnel area of her left elbow.  She is desirous having this removed.  Pre, peri, and postoperative courses have been discussed along with risks and complications.  She is aware that there is no guarantee with the surgery, possibility of infection, recurrence, injury to arteries, nerves, tendons, incomplete relief of symptoms, dystrophy.  In the preoperative area, the patient is seen.  The extremity marked by both the patient and surgeon.  Antibiotic was not given as she is allergic to multiple antibiotics.  She was prepped using ChloraPrep, supine position, left arm free.  A 3-minute dry time was allowed.  Time- out taken confirming the patient and procedure.  The limb was exsanguinated with an Esmarch bandage.  Tourniquet placed high on the arm and was inflated to 250 mmHg.  A curvilinear incision was then made over the medial aspect of left elbow in line of a possible transposition or decompression, carried down through subcutaneous tissue.  The mass was immediately encountered.  With blunt and sharp dissection, this was dissected free.  It was multilobulated in nature.  This was sent to Pathology.  Wound was copiously irrigated with saline.  The nerve was not seen and that it was deeper.  The subcutaneous  tissue was then closed with interrupted 4-0 Vicryl and the skin with interrupted 4-0 nylon sutures.  A sterile compressive dressing was applied.  On deflation of the tourniquet, all fingers immediately pinked.  She was taken to the recovery room for observation in satisfactory condition. She will be discharged home to return to [facility] in 1 week on Ultr[facility].         ______________________________ Daryll Brod, M.D.    GK/MEDQ  D:  11/02/2014  T:  11/02/2014  Job:  [id number]

## 2014-11-02 NOTE — Transfer of Care (Signed)
Immediate Anesthesia Transfer of Care Note  Patient: Laura [name]  Procedure(s) Performed: Procedure(s): EXCISION MASS LEFT ELBOW (Left)  Patient Location: PACU  Anesthesia Type:MAC and Regional  Level of Consciousness: awake, alert  and oriented  Airway & Oxygen Therapy: Patient Spontanous Breathing and Patient connected to face mask oxygen  Post-op Assessment: Report given to RN and Post -op Vital signs reviewed and stable  Post vital signs: Reviewed and stable  Last Vitals:  Filed Vitals:   11/02/14 0830  BP: 151/62  Pulse: 85  Temp:   Resp: 24    Complications: No apparent anesthesia complications

## 2014-11-02 NOTE — Discharge Instructions (Addendum)

## 2014-11-02 NOTE — Progress Notes (Signed)
Assisted Dr. Orene Desanctis with left, ultrasound guided, infraclavicular block. Side rails up, monitors on throughout procedure. See vital signs in flow sheet. Tolerated Procedure well.

## 2014-11-02 NOTE — Brief Op Note (Signed)
11/02/2014  9:15 AM  PATIENT:  Laura [name]  74 y.o. female  PRE-OPERATIVE DIAGNOSIS:  Mass left elbow  POST-OPERATIVE DIAGNOSIS:  mass left elbow  PROCEDURE:  Procedure(s): EXCISION MASS LEFT ELBOW (Left)  SURGEON:  Surgeon(s) and Role:    * Daryll Brod, MD - Primary    * Leanora Cover, MD - Assisting  PHYSICIAN ASSISTANT:   ASSISTANTS: K Kitana Gage,MD   ANESTHESIA:   regional  EBL:  Total I/O In: 800 [I.V.:800] Out: -   BLOOD ADMINISTERED:none  DRAINS: none   LOCAL MEDICATIONS USED:  NONE  SPECIMEN:  Excision  DISPOSITION OF SPECIMEN:  PATHOLOGY  COUNTS:  YES  TOURNIQUET:   Total Tourniquet Time Documented: Upper Arm (Left) - 10 minutes Total: Upper Arm (Left) - 10 minutes   DICTATION: .Other Dictation: Dictation Number [id number]  PLAN OF CARE: Discharge to home after PACU  PATIENT DISPOSITION:  PACU - hemodynamically stable.

## 2014-11-02 NOTE — H&P (Signed)
Laura [name] is a 74 year old right hand dominant female referred by Dr. Teressa [name] for a consultation with respect to a mass on her left elbow. This is just at the distal margin of her cubital tunnel. It is firm. She states it has been present for approximately 4 months but is not painful for her. She notes no numbness or tingling. She is advised by Dr. Lenna [name] to consider having this removed. She recalls no history of injury. She has no history of diabetes.   ALLERGIES:    Codeine, Amoxicillin and steroids.   MEDICATIONS:    Aspirin 81 mg, Caltrate and vitamin D, Nexium, fish oil, Glucosamine, meclizine, Zofran, and simvastatin.   SURGICAL HISTORY:    She has had an operation on her right side by Dr. Daylene [name] multiple years ago for a mass. She does not know what it was.   FAMILY MEDICAL HISTORY: Positive for diabetes, heart disease, high BP and arthritis.  SOCIAL HISTORY: She does not smoke or use alcohol. She is married and retired.  REVIEW OF SYSTEMS: Positive for glasses, otherwise negative 14 points.  Laura [name] is an 73 y.o. female.   Chief Complaint: mass left elbow HPI: see above  Past Medical History  Diagnosis Date  . Uveitis   . Sarcoidosis   . MVP (mitral valve prolapse)   . Hypercholesterolemia   . GERD (gastroesophageal reflux disease)   . IBS (irritable bowel syndrome)   . Personal history of colonic polyps     SESSILE SERRATED ADENOMAS (X2)  . Urinary tract infection, site not specified   . Osteopenia   . Anxiety   . DJD (degenerative joint disease)   . Diverticulosis of colon (without mention of hemorrhage)   . Hiatal hernia   . Stricture and stenosis of esophagus   . Unspecified gastritis and gastroduodenitis without mention of hemorrhage   . Allergy     SEASONAL  . Colon polyps   . Shortness of breath dyspnea   . Dysrhythmia     pac  . PONV (postoperative nausea and vomiting)     Past Surgical History  Procedure Laterality Date  . Vesicovaginal  fistula closure w/ tah    . Basal cell carcinoma excision    . Cataract extraction      right  . Removal of mass from rt antecubital fossa    . Abdominal hysterectomy    . Upper gastrointestinal endoscopy    . Colonoscopy  2012    Family History  Problem Relation Age of Onset  . Sarcoidosis Sister   . Sarcoidosis Brother   . Other      nephew with Wegener's   . Cancer Mother     ? type  . Colon cancer Sister   . Stroke Sister    Social History:  reports that she has never smoked. She has never used smokeless tobacco. She reports that she drinks about 0.6 oz of alcohol per week. She reports that she does not use illicit drugs.  Allergies:  Allergies  Allergen Reactions  . Prednisone     REACTION: unable to take this med due to an eye condition  . Amoxicillin-Pot Clavulanate     REACTION: causes diarrhea  . Codeine     REACTION: nausea  . Levaquin [Levofloxacin In D5w]     Had itching w/ intravenous, not sure if reaction was from abx or the need to change the IV.  Does well when taken with Benadryl.  . Tdap [Diphth-Acell  Pertussis-Tetanus]     Area raised, warm to touch, tender, swollen, pt felt jittery, nausea and some SOB  . Tape Rash    NEEDS PAPER TAPE    No prescriptions prior to admission    No results found for this or any previous visit (from the past 48 hour(s)).  No results found.   Pertinent items are noted in HPI.  Height 5' 8.5" (1.74 m), weight 80.74 kg (178 lb).  General appearance: alert, cooperative and appears stated age Head: Normocephalic, without obvious abnormality Neck: no JVD Resp: clear to auscultation bilaterally Cardio: regular rate and rhythm, S1, S2 normal, no murmur, click, rub or gallop GI: soft, non-tender; bowel sounds normal; no masses,  no organomegaly Extremities: mass medial left elbow Pulses: 2+ and symmetric Skin: Skin color, texture, turgor normal. No rashes or lesions Neurologic: Grossly  normal Incision/Wound: na  Assessment/Plan RADIOGRAPHS:   X-rays of her left elbow are negative.   DIAGNOSIS:   Soft tissue tumor unspecified, left elbow.    RECOMMENDATIONS/PLAN:    Various alternatives were discussed with her.  She is advised this may be an epidermal inclusion cyst, foreign body granuloma, it may be a cyst, it may be a giant cell tumor.  There is minimal chance that this could be a malignant tumor in that it is not significantly enlarged.  There is always potential it could be a lymph node.  It appears very superficial.  She would like to have this removed.   The pre, peri and postoperative course were discussed along with the risks and complications.  The patient is aware there is no guarantee with the surgery, possibility of infection, recurrence, injury to arteries, nerves and tendons.  She would like to proceed.   Laura [name] R 11/02/2014, 4:19 AM

## 2014-11-02 NOTE — Anesthesia Procedure Notes (Addendum)
Anesthesia Regional Block:  Supraclavicular block  Pre-Anesthetic Checklist: ,, timeout performed, Correct Patient, Correct Site, Correct Laterality, Correct Procedure, Correct Position, site marked, Risks and benefits discussed,  Surgical consent,  Pre-op evaluation,  At surgeon's request and post-op pain management  Laterality: Left and Upper  Prep: chloraprep       Needles:   Needle Type: Echogenic Needle     Needle Length: 9cm 9 cm Needle Gauge: 21 and 21 G  Needle insertion depth: 5 cm   Additional Needles:  Procedures: ultrasound guided (picture in chart) and nerve stimulator Supraclavicular block Narrative:  Start time: 11/02/2014 8:10 AM End time: 11/02/2014 8:25 AM  Performed by: Personally  Anesthesiologist: M[place], TERRY  Additional Notes: Tolerated well   Procedure Name: MAC Date/Time: 11/02/2014 8:41 AM Performed by: Melynda Ripple D Placement Confirmation: positive ETCO2

## 2014-11-03 ENCOUNTER — Encounter (HOSPITAL_BASED_OUTPATIENT_CLINIC_OR_DEPARTMENT_OTHER): Payer: Self-pay | Admitting: Orthopedic Surgery

## 2014-11-09 ENCOUNTER — Telehealth: Payer: Self-pay | Admitting: Internal Medicine

## 2014-11-09 NOTE — Telephone Encounter (Signed)
Pts PCP would like for her to take Fosamax. Pt wants to know if Dr. Hilarie Fredrickson thinks it would be ok for her to take with her "gastric issues." Please advise.

## 2014-11-09 NOTE — Telephone Encounter (Signed)
I am okay starting Should be taken with at least 8 oz of water and she should remain upright for 90 min after dosing

## 2014-11-09 NOTE — Telephone Encounter (Signed)
Left message for pt to call back  °

## 2014-11-12 NOTE — Telephone Encounter (Signed)
Spoke with pt and she is aware.

## 2014-12-06 ENCOUNTER — Encounter: Payer: Self-pay | Admitting: Pulmonary Disease

## 2014-12-06 ENCOUNTER — Ambulatory Visit (INDEPENDENT_AMBULATORY_CARE_PROVIDER_SITE_OTHER): Payer: Medicare Other | Admitting: Pulmonary Disease

## 2014-12-06 ENCOUNTER — Encounter (INDEPENDENT_AMBULATORY_CARE_PROVIDER_SITE_OTHER): Payer: Self-pay

## 2014-12-06 ENCOUNTER — Other Ambulatory Visit: Payer: Medicare Other

## 2014-12-06 ENCOUNTER — Ambulatory Visit (INDEPENDENT_AMBULATORY_CARE_PROVIDER_SITE_OTHER)
Admission: RE | Admit: 2014-12-06 | Discharge: 2014-12-06 | Disposition: A | Discharge status: Home / Self Care | Payer: Medicare Other | Source: Ambulatory Visit | Attending: Pulmonary Disease | Admitting: Pulmonary Disease

## 2014-12-06 VITALS — BP 118/76 | HR 67 | Temp 97.6°F | Wt 178.0 lb

## 2014-12-06 DIAGNOSIS — D869 Sarcoidosis, unspecified: Secondary | ICD-10-CM

## 2014-12-06 DIAGNOSIS — M159 Polyosteoarthritis, unspecified: Secondary | ICD-10-CM

## 2014-12-06 DIAGNOSIS — M150 Primary generalized (osteo)arthritis: Secondary | ICD-10-CM

## 2014-12-06 DIAGNOSIS — K219 Gastro-esophageal reflux disease without esophagitis: Secondary | ICD-10-CM

## 2014-12-06 DIAGNOSIS — E7800 Pure hypercholesterolemia, unspecified: Secondary | ICD-10-CM

## 2014-12-06 DIAGNOSIS — S32000D Wedge compression fracture of unspecified lumbar vertebra, subsequent encounter for fracture with routine healing: Secondary | ICD-10-CM

## 2014-12-06 DIAGNOSIS — M8580 Other specified disorders of bone density and structure, unspecified site: Secondary | ICD-10-CM

## 2014-12-06 DIAGNOSIS — F411 Generalized anxiety disorder: Secondary | ICD-10-CM

## 2014-12-06 DIAGNOSIS — I059 Rheumatic mitral valve disease, unspecified: Secondary | ICD-10-CM | POA: Diagnosis not present

## 2014-12-06 DIAGNOSIS — R599 Enlarged lymph nodes, unspecified: Secondary | ICD-10-CM | POA: Diagnosis not present

## 2014-12-06 DIAGNOSIS — M810 Age-related osteoporosis without current pathological fracture: Secondary | ICD-10-CM | POA: Insufficient documentation

## 2014-12-06 DIAGNOSIS — K589 Irritable bowel syndrome without diarrhea: Secondary | ICD-10-CM

## 2014-12-06 DIAGNOSIS — K222 Esophageal obstruction: Secondary | ICD-10-CM

## 2014-12-06 DIAGNOSIS — Z86010 Personal history of colonic polyps: Secondary | ICD-10-CM

## 2014-12-06 NOTE — Progress Notes (Signed)
Subjective:    Patient ID: Laura [name], female    DOB: 05/12/1941, 74 y.o.   MRN: [id number]  HPI 74 y/o WF here for a follow up visit... she has prob Sarcoidosis w/ hx Uveitis and BHA on CXR... we are following a "watchful waiting" protocol...she continues to feel well- no cough, sputum, CP, or SOB...   ~  Nov 21, 2011:  28mo ROV & Laura [name] is doing reasonably well w/o new complaints or concerns;     She was given Zithromax for an ear infection from an [facility] in [place]; 3/13 she developed an "irritation in my esoph" dyspagia/ odynophagia; EGD by DrPatterson revealed "Pill esophagitis" & she was treated w/ Carafate (off now), Nexium Bid, & Zantac 300Qhs;  improved now on & she will check w/ GI about a formulary PPI med...    We reviewed prob list, meds, xrays, & labs> see below>>   LABS 5/13:  FLP- at goals on Simva40;  Chems- wnl;  CBC- wnl;  TSH=1.61;  VitD=47  ADDENDUM>> she received TDAP w/ local reaction req antihist & topical Rx...  ~  May 23, 2012:  10mo ROV & Laura [name] reports some vertigo while on vacation recently & improved w/ Antivert...    Sarcoid> Hx abn CXR, she is asymptomatic; CXR today= right suprahilar nodule (likely sarcoid scarring) & chr changes...    MVP> on ASA81;  She denies CP, palpit, SOB, etc...     Chol> on Simva40; FLP 5/13 looked ok w/ TChol 159, TG 151, HDL 47, LDL 82; reviewed diet & wt reduction...    GI> GERD, IBS, Polyp> on Nexium40, Zantac300HS, CarafateQid prn, & Zofran prn;     UTIs> she reports no prob on Cranberry juice...    DJD, LBP, Osteop> on calium, MVI, VitD; we refilled Tramadol for prn use...    Anxiety> she has a lot of family support, doesn't require meds... We reviewed prob list, meds, xrays and labs> see below for updates >> OK Flu shot today...  CXR 11/13 showed similar changes- irreg nodular changes in right suprahilar region, scarring, stable adenopathy, mild atherosclerotic calcif, osteopenia...   ~  Dec 02, 2012:  27mo ROV & Laura [name] reports  that in Feb2014 she fell while getting up at night & hit her back w/ severe pain & L1 compression fx- adm to [facility] for 2d w/ kyphoplasty & good pain relief; also told to have LLLpneumonia treated w/ Levaquin; she had f/u here by TP 3/14 & improved; since then she is back to normal- had nice [place] vacation, feeling well, etc...  We reviewed the following medical problems during today's office visit >>     Sarcoid> Hx abn CXR, she is asymptomatic; baseline CXR w/ right suprahilar nodule, adenop, scarring & chr changes; ?LLLpneum 4/25 in [place] treated w/ Levaquin...    MVP> on ASA81;  She denies CP, palpit, SOB, etc...     Chol> on Simva40; FLP 5/14 showed TChol 165, TG 157, HDL 41, LDL 93; reviewed diet & wt reduction...    GI> GERD, IBS, Polyp> on Nexium40Bid, Zantac300HS, CarafateQid prn, & Zofran prn; she remains asymptomatic w/o abd pain, n/v, c/d, blood seen...    UTIs> she reports no prob on Cranberry juice...    DJD, LBP, L1 compression w/ kyphoplasty, Osteop> on calium, MVI, VitD; we refilled Tramadol for prn use; she fell 2/14 w/ L1 compression & kyphoplasty in [place]; she needs f/u BMD.    Anxiety> she has a lot of family support,  doesn't require meds... We reviewed prob list, meds, xrays and labs> see below for updates >>   CXR 3/14 showed normal heart size, atherosclerotic Ao, chr scarring from underlying sarcoid, NAD...  LABS 5/14:  FLP- at goals on Simva40;  Chems- wnl;  CBC- wnl;  TSH=1.74;  VitD=52...  ~  June 03, 2013:  53mo ROV & Laura [name] has had some prob w/ pain & swelling in right ankle- she has appt w/ Ortho this afternoon (DrDaldorf)...     Hx prob Sarcoid and abn CXR; she remains asymptomatic & last film 3/14 showed stable scarring, NAD...     She remains on Simva40 for Chol; Labs 5/14 looked ok & we reviewed diet, exercise, & wt reduction strategies...    She has Hx GERD, IBS, colon polyps> on Nexium40Qam, Zantac300Qhs, Carafate 1gmQidPrn & doing fine w/o  abd apin, dysphagia, n/v, c/d, blood seen; last colon was 5/12...    She has hx DJD, LBP, L1 compression w/ kyphoplasty; on Tramadol prn, takes calcium, MVI, VitD; last BMD was done here 5/11 w/ TScores -1.2 in Spine and FemNecks... We reviewed prob list, meds, xrays and labs> see below for updates >> she had the 2014 flu vaccine in Oct...   ~  Dec 09, 2013:  70mo ROV & Laura [name] saw TP several months ago c/o dyspnea and fatigue> she's been sedentary due to foot problem & when she tried to walk she noted trouble w/ hills, no CP, etc; hx sarcoid but she has never required treatment as it has been felt to be inactive- f/u CXR unchanged; felt to likely be deconditioning but referred to Cards for their eval as well...    Seen by DrTaylor for [employer] 2DEcho showed norm LVF, norm valves, no signs of pulmHTN; subdeq stress testing showed extreme dyspnea w/ exercise (poor functional capacity), no ischemia; she has been encouraged to join silver sneakers and incr her exercise program...     Seen by DrPyrtle for GI> c/o epig discomfort, hx GERD/ IBS/ colon polyps; symptoms improved by incr Nexium to Bid & adding Carafate prn; she is up to date on screening...  We reviewed prob list, meds, xrays and labs> see below for updates >>   LABS 1/15:  Chems- ok x K=3.4;  CBC- wnl;  TSH=2.28;  Mag=2.0.Marland KitchenMarland Kitchen  CXR 1/15 showed norm heart size, areas of scarring appear unchanged, prom hilae w/o change- stable, NAD.Marland KitchenMarland Kitchen   EKG 3/15 showed NSR, rate79, wnl, NAD...  Stress Test 3/15 showed poor exercise capacity, no ischemia...  2DEcho 3/15 showed normal LVF w/ EF=55-60%, no regional wall motion abn, normal valves, no evid for pulmHTN...  PFTs 5/15 showed just some sm airways dis> FVC=2.65 (78%), FEV1=1.86 (72%); %1sec=70; mid-flows=55% predicted... After bronchodil her FEV1 improved 6%... Lung Volumes & DLCO are wnl...   LABS 5/15:  ACE level = 55     ~  June 07, 2014:  22mo ROV & Laura [name] is improved> she has been exercising at  home 2-3d/wk & [facility]; she notes less dyspnea w/ walking, on hills, etc...  She saw DrPyrtle 6/15- Hx GERD w/ esoph, IBS, adenomatous colon polyps; on Nexium40/d and Carafate 1tmQhs and doing well w/o breakthrough symptoms...  We reviewed the following medical problems during today's office visit >>     Sarcoid> Hx abn CXR, she is asymptomatic; baseline CXR w/ right suprahilar nodule, adenop, scarring & chr changes; ?LLLpneum 3/15 in [place] treated w/ Levaquin; last CXR 1/15 was stable and last ACE=55 .Marland KitchenMarland Kitchen  MVP> on ASA81;  She denies CP, palpit, SOB, etc...     Chol> on Simva40 & FishOil; last FLP 11/15 showed TChol 169, TG 135, HDL 41, LDL 101; reviewed diet & wt reduction...    GI> GERD, IBS, Polyp> on Nexium40, Carafate1GmQhs, & Zofran prn; she remains asymptomatic w/o abd pain, n/v, c/d, blood seen...    UTIs> she reports no prob on Cranberry juice...    DJD, LBP, L1 compression w/ kyphoplasty, Osteop> on calium, MVI, VitD & OTC analgesics; she fell 2/14 w/ L1 compression & kyphoplasty in [place]; she is ready for f/u BMD=> pending...    Anxiety> she has a lot of family support, doesn't require meds... We reviewed prob list, meds, xrays and labs> see below for updates >> she had the 2015 Flu shot recently, she is in need of the Prevnar-13 vaccine but wants to wait...   LABS 11/15:  FLP- looks good on Simva40, needs to lose wt;  Chems- wnl;  CBC- wnl;  TSH=2.19;  VitD=52...  ~  Dec 06, 2014:  69[place] had left elbow surg for a nodule & excision by [name] showed path=numerous nonnecrotizing granuloma, spec stains neg & this is c/w an unusual manifestation of her Sarcoid; she was told "scar tissue" and is improved now... We reviewed the following medical problems during today's office visit >>     Sarcoid> Hx abn CXR, she is asymptomatic; baseline CXR w/ right suprahilar nodule, adenop, scarring & chr changes; ?LLLpneum 7/32 in [place] treated w/ Levaquin; last CXR 1/15 was  stable and last ACE=55; she had nodule excised from left elbow 4/16 by DrKuzma=> granulomatous nodule c/w unusual manifewstation of Sarcoid, all spec stains were neg...    HxMVP, palpit w/ PVCs & PACs on Holter> on ASA81;  She denies CP, palpit, SOB now; advised no caffeine & avoid stress!    Chol> on Simva40 & FishOil; last FLP 11/15 showed TChol 169, TG 135, HDL 41, LDL 101; reviewed diet & wt reduction...    GI> GERD, IBS, Polyp> on Prilosec40Bid, Carafate1GmQhs, & Zofran prn; followed by DrJacobs- she remains asymptomatic w/o abd pain, n/v, c/d, blood seen...    UTIs> she reports no prob on Cranberry juice but had one recurrent UTI 4/16- Cipro cqalled in & symptoms resolved....    DJD, LBP, L1 compression w/ kyphoplasty, Osteop> on calcium, MVI, VitD & Tramadol50; she fell 2/14 w/ L1 compression & kyphoplasty in [place]; BMD 2/16 w/ Tscore-1.8 & we rec ALENDRONATE70/wk...    Anxiety> she has a lot of family support, doesn't require meds... We reviewed prob list, meds, xrays and labs> see below for updates >>   CXR 5/16 showed stable BHA & scarring on right, NAD, no change...  LABS 5/16:  ACE=56, stable...          Problem List:  Hx of UVEITIS (ICD-364.3) - eval at [facility] since 2001, given steroid injection in 2002... s/p right cataract surg 2/09 and no active uveitis seen... ~  4/11:  she tells me she was seen 10/10 & doing well, may need left cataract surg soon.  R/O SARCOIDOSIS (ICD-135) - Hx uveitis w/ eval at [facility], and subseq f/u CXR w/ CTChest showing bilat hilar adenopathy, and w/u showing neg PPD, ACE=53, sed=13... prev PFT's 1/08 showed FVC 3.75 (111%), FEV1 2.74 (102%), %1sec=73, mid-flows=70%... being followed w/ serial films and "watchful waiting"... there is a family hx of sarcoidosis. ~   we had a f/u CXR in Feb09- streaky opacity right mid  lung zone, similar to 2008...  ~  CT Chest 7/08 & 6/09 showed mediastinal > hilar adenopathy some w/ calcif (generally smaller than  1/08), area of atx/ scarring right mid zone (infer RUL) & scat <1cm nodules in lower zones w/o change... ~  CXR 2/10 showed bilat hilar prominence & scarring appears the same- NAD... labs all WNL w/ ACE=56. ~  CXR 4/11 showed mild bilat hilar prominence, scarring, NAD- osteopenia & DJD in TSpine... ACE= 37. ~  CXR 4/12 showed stable bilat hilar prominence, scarring, NAD.[name]. ~  CXR 11/13 showed similar changes- irreg nodular changes in right suprahilar region, scarring, stable adenopathy, mild atherosclerotic calcif, osteopenia... ~  CXR 3/14 showed normal heart size, atherosclerotic Ao, chr scarring from underlying sarcoid, NAD.[name]. ~  CXR 1/15 showed norm heart size, areas of scarring appear unchanged, prom hilae w/o change- stable, NAD ~  PFTs 5/15 showed just some sm airways dis> FVC=2.65 (78%), FEV1=1.86 (72%); %1sec=70; mid-flows=55% predicted... After bronchodil her FEV1 improved 6%... Lung Volumes & DLCO are wnl. ~  LABS 5/15:  ACE level = 55  ~  4/16: she had nodule excised from left elbow 4/16 by DrKuzma=> granulomatous nodule c/w unusual manifewstation of Sarcoid, all spec stains were neg... ~  5/16: CXR 5/16 showed stable BHA & scarring on right, NAD, no change... LABS 5/16:  ACE=56, stable.  PALPITATIONS >>  Hx MITRAL VALVE PROLAPSE >> on ASA 81mg /d... clinical dx w/ 2DEcho 1999 showing flat closure but no frank prolapse... ~  2/16: she had a cardiac eval by DrTaylor for dyspnea> 2DEcho was neg, wnl; stress testing was neg- w/o ischemia; noted to have poor functional capacity & is way too sedentary; she had some palpit & monitor revealed PVCs & PACs- didn't want meds therefore asked to avoid caffeine 7 stress...  HYPERCHOLESTEROLEMIA (ICD-272.0) - on SIMVASTATIN 40mg /d now... ~  [place] 1/08 on Lip10 showed TChol 183, TG 122, HDL 45, LDL 114...  ~  [place] 2/09 on Lip10 showed TChol 183, TG 189, HDL 39, LDL 107... continue diet & change to Simva40.  ~  FLP 5/09 on Simva40 showed TChol 185, TG  163, HDL 36, LDL 117... she wants to stay on Simva40. ~  FLP 2/10 showed TChol 176, Tg 160, HDL 39, LDL 105... rec> continue same. ~  FLP 4/11 showed TChol 160, TG 131, HDL 41, LDL 93 ~  FLP 4/12 showed TChol 174, TG 148, HDL 43, LDL 102 ~  FLP 5/13 showed TChol 159, TG 151, HDL 47, LDL 82 ~  FLP 5/14 on Simva40 showed TChol 165, TG 157, HDL 41, LDL 93 ~  FLP 11/15 on Simva40 showed TChol 169, TG 135, HDL 41, LDL 101  GERD (ICD-530.81) - on NEXIUM 40mg /d & ZANTAC 300mg Qhs... ~  EGD 6/07 w/ 3cmHH, stricture, gastitis (HPylori neg)... I offered to change her Nexium to a generic but she declined- "DrPatterson told me never to change this med"... she notes occas nocturnal reflux symptoms. ~  5/12: She had GI eval by DrPatterson 5/12> chr GERD, hx 3cmHH, prev stricture dilated, hx colon polyps, etc> c/o incr reflux symptoms, bloating, pressure despite her Nexium40AM & Zantac300PM;  Nexium was increased to Bid, Carafate added Qid & she had EGD 5/12 showing mod gastritis w/ bx= chr inflamm, neg HPylori... ~  CTAbd 5/12 showed mult parenchymal nodules at lung bases (some fractionally larger), calcif hilar nodes bilat, no renal lesions (?left upper pole mass seen on sonar?). ~  EGD 3/13 by DrPatterson showed 3cmHH, esophagitis,  prob "pill espohagitis", dilated... Symptoms resolved w/ Carafate, Nexium, Zantac, Xylocaine... ~  5/14: on Nexium40Bid, Zantac300HS, CarafateQid prn, & Zofran prn; she remains asymptomatic w/o abd pain, n/v, c/d, blood seen... ~  11/15: on Nexium40, Carafate1GmQhs, & Zofran prn; she has seen DrPyrtle & remains asymptomatic on these meds- w/o abd pain, n/v, c/d, blood seen...  IRRITABLE BOWEL SYNDROME (ICD-564.1) COLONIC POLYPS, HX OF (ICD-V12.72)  ~  Colonoscopy 6/07 by DrPatterson showing divertics & polyps (adenomatous)... f/u planned 29yrs... ~  She had f/u colonoscopy 5/12 showing mod divertics, sessile polyp in cecum= serrated adenoma w/ f/u planned 3-52yrs...  UTI'S, CHRONIC  (ICD-599.0) - eval by DrOttelin for urology... now off the Macrodantin and using cranberry juice without recurrent UTI, no problems...  DEGENERATIVE JOINT DISEASE (ICD-715.90) - eval by DrDalldorf w/ mod DJD in knees & hx of torn left meniscus... 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 overall improved now. ~  11/13:  TRAMADOL 50mg  Tid refilled for prn use... ~  2015-16: she's been eval by [facility] for Ortho> right & left knee DJD, right ankle tendonitis, right plantar fasciitis...  ~  4/16: she had right elbow surg by DrKuzma> mult granulomas on path c/w sarcoid nodule...  LOW BACK PAIN, CHRONIC (ICD-724.2) ~  2/14: she fell while getting up at night & hit her back w/ severe pain & L1 compression fx- adm to [facility] for 2d w/ kyphoplasty & good pain relief...  OSTEOPENIA (ICD-733.90) - on Calcium, MVI, Vit D... ~  BMD in 2005 showed TScore -1.2 in Spine, & -0.7 in [facility]. ~  BMD 5/09 showed TScores -1.5 in Spine & -1.1 in FemNecks. ~  BMD here 5/11 showed TScores -1.2 in Spine, and -1.2 in [facility]. ~  5/13 & 5/14:  She is due for f/u BMD but wants to wait for now... ~  Labs 11/15 showed VitD= 52... ~  BMD 2/16 revealed Tscore -1.8 in Lspine and Rt FemNeck; we rec FOSAMAX70/wk in light of her prev compression fx...  ANXIETY (ICD-300.00)  Hx of BASAL CELL SKIN CANCER - she still sees her Derm in [place] yearly...  Health Maintenance: ~  GI:  followed by DrPatterson/Pyrtle w/ colon as above... ~  GYN:  followed by DrRichardson & doing well by pt's report... Mammograms at the [facility]... ~  Immunizations:  she gets yearly flu shots;  has PNEUMOVAX in 1998 & repeated 10/11 (age 75);  TDAP given 5/13;  we discussed Shingles vaccine (she had bout of shingles involving her right hip & leg ~age30) & she will decide.    Past Surgical History  Procedure Laterality Date  .  Vesicovaginal fistula closure w/ tah    . Basal cell carcinoma excision    . Cataract extraction      right  . Removal of mass from rt antecubital fossa    . Abdominal hysterectomy    . Upper gastrointestinal endoscopy    . Colonoscopy  2012  . Mass excision Left 11/02/2014    Procedure: EXCISION MASS LEFT ELBOW;  Surgeon: Daryll Brod, MD;  Location: [place];  Service: Orthopedics;  Laterality: Left;    Outpatient Encounter Prescriptions as of 12/06/2014  Medication Sig  . alendronate (FOSAMAX) 70 MG tablet Take 1 tablet (70 mg total) by mouth once a week. Take with a full glass of water on an empty stomach.  [name] aspirin 81 MG tablet Take  81 mg by mouth daily.    . B Complex Vitamins (VITAMIN-B COMPLEX PO) Take 1 capsule by mouth daily.   . Calcium Carbonate (CALTRATE 600 PO) Take 1 tablet by mouth 2 (two) times daily.    . Cholecalciferol (VITAMIN D) 1000 UNITS capsule Take 1,000 Units by mouth daily.    . fexofenadine (ALLEGRA) 180 MG tablet Take 180 mg by mouth daily.  . fluticasone (FLONASE) 50 MCG/ACT nasal spray Place into both nostrils daily.  . Glucosamine HCl (GLUCOSAMINE PO) Liquid form - Take by mouth as directed once daily  . meclizine (ANTIVERT) 25 MG tablet Take 1/2-1 tablet by mouth every 4 hours as needed for dizziness  . Multiple Vitamins-Minerals (MULTIVITAL) tablet Take 1 tablet by mouth daily.    . Omega-3 Fatty Acids (FISH OIL) 1000 MG CAPS Take 1 capsule by mouth daily.    [name] omeprazole (PRILOSEC) 40 MG capsule Take 1 capsule (40 mg total) by mouth 2 (two) times daily. (Patient taking differently: Take 40 mg by mouth daily. )  . ondansetron (ZOFRAN) 8 MG tablet 1 tablet by mouth every 6 hours as needed for nausea  . simvastatin (ZOCOR) 40 MG tablet Take 1 tablet (40 mg total) by mouth at bedtime.  . sucralfate (CARAFATE) 1 G tablet Take 1 tablet (1 g total) by mouth 2 (two) times daily.  . traMADol (ULTRAM) 50 MG tablet Take 1 tablet (50 mg total) by  mouth every 6 (six) hours as needed.  . ciprofloxacin (CIPRO) 250 MG tablet Take 1 tablet (250 mg total) by mouth 2 (two) times daily. (Patient not taking: Reported on 12/06/2014)   No facility-administered encounter medications on file as of 12/06/2014.    Allergies  Allergen Reactions  . Prednisone     REACTION: unable to take this med due to an eye condition  . Amoxicillin-Pot Clavulanate     REACTION: causes diarrhea  . Codeine     REACTION: nausea  . Levaquin [Levofloxacin In D5w]     Had itching w/ intravenous, not sure if reaction was from abx or the need to change the IV.  Does well when taken with Benadryl.  . Tdap [Diphth-Acell Pertussis-Tetanus]     Area raised, warm to touch, tender, swollen, pt felt jittery, nausea and some SOB  . Tape Rash    NEEDS PAPER TAPE    Review of Systems         See HPI - all other systems neg except as noted... The patient denies anorexia, fever, weight loss, weight gain, vision loss, decreased hearing, hoarseness, chest pain, syncope, dyspnea on exertion, peripheral edema, prolonged cough, headaches, hemoptysis, abdominal pain, melena, hematochezia, severe indigestion/heartburn, hematuria, incontinence, muscle weakness, suspicious skin lesions, transient blindness, difficulty walking, depression, unusual weight change, abnormal bleeding, enlarged lymph nodes, and angioedema.     Objective:   Physical Exam    WD, WN, 74 y/o WF in NAD... GENERAL:  Alert & oriented; pleasant & cooperative... HEENT:  [place]/AT, EOM-wnl, PERRLA, EACs-clear, TMs-wnl, NOSE-clear, THROAT-clear & wnl. NECK:  Supple w/ fairROM; no JVD; normal carotid impulses w/o bruits; no thyromegaly or nodules palpated; no lymphadenopathy. CHEST:  Clear to P & A; without wheezes/ rales/ or rhonchi. HEART:  Regular Rhythm; without murmurs/ rubs/ or gallops. ABDOMEN:  Soft & nontender; normal bowel sounds; no organomegaly or masses detected. EXT: without deformities, mild arthritic  changes; no varicose veins/ +venous insuffic/ no edema. NEURO:  CN's intact;  no focal neuro deficits... DERM:  No lesions noted;  no rash etc...  RADIOLOGY DATA:  Reviewed in the EPIC EMR & discussed w/ the patient...  LABORATORY DATA:  Reviewed in the EPIC EMR & discussed w/ the patient...   Assessment & Plan:    DYSPNEA w/ thorough PULM & CARDIAC eval>> underlying sarcoidosis w/ mild pulm fibrosis, no signs of dis activity, remains on watchful waiting protocol; Cards eval by DrTaylor was neg as well x PVCs & PACs; she has improved on a gradual exercise program, avoids caffeine & stress...  SARCOID>  CXR/ PFT/ ACE stable & she remains asymptomatic, no sigs of dis activity x nodule left elbow excised & path revealed nonnecrotizing granulomata...  CHOL>  Stable on the Simva40 + diet...  GERD>  On Prilosec & Zantac, she is currently improved from her prev symptoms...  DJD/ LBP/ Osteopenia>  As noted she remains stable on current meds & exercise program; she will see DrDaldorf about her ankle pain; BMD 2/16 w/ Tscore -1.8 & rec to start [place]...  Compression fx L1 after fall> she is s/p L1 kyphoplasty...  Other medical issues as noted...   Patient's Medications  New Prescriptions   No medications on file  Previous Medications   ALENDRONATE (FOSAMAX) 70 MG TABLET    Take 1 tablet (70 mg total) by mouth once a week. Take with a full glass of water on an empty stomach.   ASPIRIN 81 MG TABLET    Take 81 mg by mouth daily.     B COMPLEX VITAMINS (VITAMIN-B COMPLEX PO)    Take 1 capsule by mouth daily.    CALCIUM CARBONATE (CALTRATE 600 PO)    Take 1 tablet by mouth 2 (two) times daily.     CHOLECALCIFEROL (VITAMIN D) 1000 UNITS CAPSULE    Take 1,000 Units by mouth daily.     CIPROFLOXACIN (CIPRO) 250 MG TABLET    Take 1 tablet (250 mg total) by mouth 2 (two) times daily.   FEXOFENADINE (ALLEGRA) 180 MG TABLET    Take 180 mg by mouth daily.   FLUTICASONE (FLONASE) 50 MCG/ACT NASAL  SPRAY    Place into both nostrils daily.   GLUCOSAMINE HCL (GLUCOSAMINE PO)    Liquid form - Take by mouth as directed once daily   MECLIZINE (ANTIVERT) 25 MG TABLET    Take 1/2-1 tablet by mouth every 4 hours as needed for dizziness   MULTIPLE VITAMINS-MINERALS (MULTIVITAL) TABLET    Take 1 tablet by mouth daily.     OMEGA-3 FATTY ACIDS (FISH OIL) 1000 MG CAPS    Take 1 capsule by mouth daily.     OMEPRAZOLE (PRILOSEC) 40 MG CAPSULE    Take 1 capsule (40 mg total) by mouth 2 (two) times daily.   ONDANSETRON (ZOFRAN) 8 MG TABLET    1 tablet by mouth every 6 hours as needed for nausea   SIMVASTATIN (ZOCOR) 40 MG TABLET    Take 1 tablet (40 mg total) by mouth at bedtime.   SUCRALFATE (CARAFATE) 1 G TABLET    Take 1 tablet (1 g total) by mouth 2 (two) times daily.   TRAMADOL (ULTRAM) 50 MG TABLET    Take 1 tablet (50 mg total) by mouth every 6 (six) hours as needed.  Modified Medications   No medications on file  Discontinued Medications   No medications on file

## 2014-12-06 NOTE — Patient Instructions (Signed)
Today we updated your med list in our EPIC system...    Continue your current medications the same...    We refilled the meds you requested...  Today we did a follow up CXR & ACE level to check your Sarcoidosis...    We will contact you w/ the results when available...   Keep up the good work w/ diet & exercise...  Call for any questions...  Let's plan a follow up visit in 58mo, sooner if needed for problems.Marland KitchenMarland Kitchen

## 2014-12-07 LAB — ANGIOTENSIN CONVERTING ENZYME: ANGIOTENSIN-CONVERTING ENZYME: 56 U/L — AB (ref 8–52)

## 2014-12-28 ENCOUNTER — Encounter: Payer: Self-pay | Admitting: Gastroenterology

## 2014-12-28 ENCOUNTER — Ambulatory Visit (INDEPENDENT_AMBULATORY_CARE_PROVIDER_SITE_OTHER): Payer: Medicare Other | Admitting: Internal Medicine

## 2014-12-28 ENCOUNTER — Encounter: Payer: Self-pay | Admitting: Internal Medicine

## 2014-12-28 VITALS — BP 142/80 | HR 75 | Ht 68.5 in | Wt 177.2 lb

## 2014-12-28 DIAGNOSIS — R002 Palpitations: Secondary | ICD-10-CM | POA: Diagnosis not present

## 2014-12-28 DIAGNOSIS — R0600 Dyspnea, unspecified: Secondary | ICD-10-CM

## 2014-12-28 DIAGNOSIS — I059 Rheumatic mitral valve disease, unspecified: Secondary | ICD-10-CM

## 2014-12-28 NOTE — Assessment & Plan Note (Signed)
Her dyspnea is improved. Will follow.

## 2014-12-28 NOTE — Assessment & Plan Note (Signed)
She has no murmur on exam today. Will follow.

## 2014-12-28 NOTE — Progress Notes (Signed)
HPI Mrs. Schum returns today for followup. She is a pleasant 74 yo woman with a h/o unexplained dyspnea, and PVC's and PAC's who returns for followup. Her symptoms are well controlled. No chest pain or sob. No syncope. Overall she is better.  Allergies  Allergen Reactions  . Prednisone     REACTION: unable to take this med due to an eye condition  . Amoxicillin-Pot Clavulanate     REACTION: causes diarrhea  . Codeine     REACTION: nausea  . Levaquin [Levofloxacin In D5w]     Had itching w/ intravenous, not sure if reaction was from abx or the need to change the IV.  Does well when taken with Benadryl.  . Tdap [Diphth-Acell Pertussis-Tetanus]     Area raised, warm to touch, tender, swollen, pt felt jittery, nausea and some SOB  . Tape Rash    NEEDS PAPER TAPE     Current Outpatient Prescriptions  Medication Sig Dispense Refill  . alendronate (FOSAMAX) 70 MG tablet Take 1 tablet (70 mg total) by mouth once a week. Take with a full glass of water on an empty stomach. 4 tablet 6  . aspirin 81 MG tablet Take 81 mg by mouth daily.      . B Complex Vitamins (VITAMIN-B COMPLEX PO) Take 1 capsule by mouth daily.     . Calcium Carbonate (CALTRATE 600 PO) Take 1 tablet by mouth 2 (two) times daily.      . Cholecalciferol (VITAMIN D) 1000 UNITS capsule Take 1,000 Units by mouth daily.      . Glucosamine HCl (GLUCOSAMINE PO) Liquid form - Take by mouth as directed once daily    . meclizine (ANTIVERT) 25 MG tablet Take 1/2-1 tablet by mouth every 4 hours as needed for dizziness    . Multiple Vitamins-Minerals (MULTIVITAL) tablet Take 1 tablet by mouth daily.      . Omega-3 Fatty Acids (FISH OIL) 1000 MG CAPS Take 1 capsule by mouth daily.      [name] omeprazole (PRILOSEC) 40 MG capsule Take 1 capsule (40 mg total) by mouth 2 (two) times daily. (Patient taking differently: Take 40 mg by mouth daily. ) 60 capsule 5  . ondansetron (ZOFRAN) 8 MG tablet 1 tablet by mouth every 6 hours as needed for  nausea 25 tablet 5  . simvastatin (ZOCOR) 40 MG tablet Take 1 tablet (40 mg total) by mouth at bedtime. 90 tablet 3  . sucralfate (CARAFATE) 1 G tablet Take 1 tablet (1 g total) by mouth 2 (two) times daily. 60 tablet 2   No current facility-administered medications for this visit.     Past Medical History  Diagnosis Date  . Uveitis   . Sarcoidosis   . MVP (mitral valve prolapse)   . Hypercholesterolemia   . GERD (gastroesophageal reflux disease)   . IBS (irritable bowel syndrome)   . Personal history of colonic polyps     SESSILE SERRATED ADENOMAS (X2)  . Urinary tract infection, site not specified   . Osteopenia   . Anxiety   . DJD (degenerative joint disease)   . Diverticulosis of colon (without mention of hemorrhage)   . Hiatal hernia   . Stricture and stenosis of esophagus   . Unspecified gastritis and gastroduodenitis without mention of hemorrhage   . Allergy     SEASONAL  . Colon polyps   . Shortness of breath dyspnea   . Dysrhythmia     pac  . PONV (  postoperative nausea and vomiting)     ROS:   All systems reviewed and negative except as noted in the HPI.   Past Surgical History  Procedure Laterality Date  . Vesicovaginal fistula closure w/ tah    . Basal cell carcinoma excision    . Cataract extraction      right  . Removal of mass from rt antecubital fossa    . Abdominal hysterectomy    . Upper gastrointestinal endoscopy    . Colonoscopy  2012  . Mass excision Left 11/02/2014    Procedure: EXCISION MASS LEFT ELBOW;  Surgeon: Daryll Brod, MD;  Location: [facility];  Service: Orthopedics;  Laterality: Left;     Family History  Problem Relation Age of Onset  . Sarcoidosis Sister   . Sarcoidosis Brother   . Other      nephew with Wegener's   . Cancer Mother     ? type  . Colon cancer Sister   . Stroke Sister      History   Social History  . Marital Status: Married    Spouse Name: Khristi Schiller  . Number of Children: 2  .  Years of Education: N/A   Occupational History  . Retired     & Part Time Artist   Social History Main Topics  . Smoking status: Never Smoker   . Smokeless tobacco: Never Used  . Alcohol Use: 0.6 oz/week    1 Glasses of wine per week     Comment: occ wine   . Drug Use: No  . Sexual Activity: Not on file   Other Topics Concern  . Not on file   Social History Narrative     BP 142/80 mmHg  Pulse 75  Ht 5' 8.5" (1.74 m)  Wt 177 lb 3.2 oz (80.377 kg)  BMI 26.55 kg/m2  Physical Exam:  Well appearing 74 yo woman, NAD HEENT: Unremarkable Neck:  6 cm JVD, no thyromegally Back:  No CVA tenderness Lungs:  Clear with no wheezes HEART:  Regular rate rhythm, no murmurs, no rubs, no clicks Abd:  soft, positive bowel sounds, no organomegally, no rebound, no guarding Ext:  2 plus pulses, no edema, no cyanosis, no clubbing Skin:  No rashes no nodules Neuro:  CN II through XII intact, motor grossly intact  Cardiac monitor: Predominantly sinus rhythm with frequent PVCs, occasional PACs, but no sustained arrhythmias.  Assess/Plan:

## 2014-12-28 NOTE — Patient Instructions (Signed)
Medication Instructions:  Your physician recommends that you continue on your current medications as directed. Please refer to the Current Medication list given to you today.   Labwork: NONE  Testing/Procedures: NONE  Follow-Up: Follow up with Dr. Taylor as needed.  Any Other Special Instructions Will Be Listed Below (If Applicable).   

## 2014-12-28 NOTE — Assessment & Plan Note (Signed)
Her symptoms are quiet. She will undergo watchful waiting. I will see her back on an as needed basis.

## 2015-02-03 ENCOUNTER — Other Ambulatory Visit: Payer: Self-pay | Admitting: Pulmonary Disease

## 2015-03-09 ENCOUNTER — Ambulatory Visit (INDEPENDENT_AMBULATORY_CARE_PROVIDER_SITE_OTHER): Payer: Medicare Other | Admitting: Internal Medicine

## 2015-03-09 ENCOUNTER — Encounter: Payer: Self-pay | Admitting: Internal Medicine

## 2015-03-09 VITALS — BP 132/72 | HR 76 | Ht 68.5 in | Wt 177.4 lb

## 2015-03-09 DIAGNOSIS — Z860100 Personal history of colon polyps, unspecified: Secondary | ICD-10-CM

## 2015-03-09 DIAGNOSIS — K219 Gastro-esophageal reflux disease without esophagitis: Secondary | ICD-10-CM | POA: Diagnosis not present

## 2015-03-09 DIAGNOSIS — K224 Dyskinesia of esophagus: Secondary | ICD-10-CM | POA: Diagnosis not present

## 2015-03-09 MED ORDER — SUCRALFATE 1 G PO TABS: 1.0000 g | ORAL_TABLET | Freq: Two times a day (BID) | ORAL | Status: DC

## 2015-03-09 MED ORDER — DILTIAZEM HCL 30 MG PO TABS: 30.0000 mg | ORAL_TABLET | Freq: Four times a day (QID) | ORAL | Status: DC

## 2015-03-09 MED ORDER — ESOMEPRAZOLE MAGNESIUM 40 MG PO CPDR: 40.0000 mg | DELAYED_RELEASE_CAPSULE | Freq: Every day | ORAL | Status: DC

## 2015-03-09 NOTE — Patient Instructions (Signed)
We have sent the following medications to your pharmacy for you to pick up at your convenience: Nexium 40 mg daily (in place of omeprazole) Diltiazem every 6 hours (please take in the evening and bedtime only for now) Carafate twice daily  You will be due for a recall colonoscopy in 11/2015. We will send you a reminder in the mail when it gets closer to that time.  Please follow up with Dr Hilarie Fredrickson in 3 months.

## 2015-03-09 NOTE — Progress Notes (Signed)
Subjective:    Patient ID: Laura [name], female    DOB: [birth year], 74 y.o.   MRN: [id number]  HPI Laura [name] is a 74 year old female with long-standing history of GERD, IBS, adenomatous colon polyps, family history of colon cancer in sister who is seen for follow-up. She also has a history of degenerative joint disease, hyperlipidemia, sarcoidosis. She was last seen in the office in December 2015. She reports that both reflux and IBS seem to be somewhat stress related for her. She was previous he taking Nexium 40 mg once per day but insurance stopped covering it and she was switched to omeprazole 40 mg daily. She is using this once daily very occasionally twice daily for breakthrough heartburn. She has noticed a significant difference in omeprazole in that it is less effective. She is having nocturnal esophageal pain which she feels is spasm. She feels a tightening and relaxing usually in the middle of the night. This radiates from her chest into her neck. She notices it worse if sleeping on her right side also worse if she eats late. She denies dysphagia or odynophagia. When she is eating food is going down easily. No abdominal pain recently. No trouble with nausea or vomiting. She was started on Fosamax by primary care but this worsened reflux and also seemed to cause joint and bone pain.she stopped the medication. She plans to discuss this with primary care at follow-up in the next several months. She continues to use Carafate twice daily which she's done for long-term. She rarely if ever needed Zofran. Appetite has been good and her weight has been very stable recently  Review of Systems As per history of present illness, otherwise negative  Current Medications, Allergies, Past Medical History, Past Surgical History, Family History and Social History were reviewed in [employer] record.     Objective:   Physical Exam BP 132/72 mmHg  Pulse 76  Ht 5' 8.5" (1.74 m)  Wt  177 lb 6.4 oz (80.468 kg)  BMI 26.58 kg/m2 Constitutional: Well-developed and well-nourished. No distress. HEENT: Normocephalic and atraumatic. Oropharynx is clear and moist. No oropharyngeal exudate. Conjunctivae are normal.  No scleral icterus. Neck: Neck supple. Trachea midline. Cardiovascular: Normal rate, regular rhythm and intact distal pulses. No M/R/G Pulmonary/chest: Effort normal and breath sounds normal. No wheezing, rales or rhonchi. Abdominal: Soft, nontender, nondistended. Bowel sounds active throughout. There are no masses palpable. No hepatosplenomegaly. Extremities: no clubbing, cyanosis, or edema Lymphadenopathy: No cervical adenopathy noted. Neurological: Alert and oriented to person place and time. Skin: Skin is warm and dry. No rashes noted. Psychiatric: Normal mood and affect. Behavior is normal.  CBC    Component Value Date/Time   WBC 6.2 06/07/2014 1028   RBC 5.17* 06/07/2014 1028   HGB 15.2* 11/02/2014 0739   HCT 47.0* 06/07/2014 1028   PLT 253.0 06/07/2014 1028   MCV 90.8 06/07/2014 1028   MCHC 33.0 06/07/2014 1028   RDW 13.2 06/07/2014 1028   LYMPHSABS 1.8 06/07/2014 1028   MONOABS 0.4 06/07/2014 1028   EOSABS 0.2 06/07/2014 1028   BASOSABS 0.0 06/07/2014 1028    CMP     Component Value Date/Time   NA 141 07/26/2014 1258   K 4.0 07/26/2014 1258   CL 103 07/26/2014 1258   CO2 30 07/26/2014 1258   GLUCOSE 84 07/26/2014 1258   GLUCOSE 105* 08/01/2006 0943   BUN 16 07/26/2014 1258   CREATININE 0.7 07/26/2014 1258   CALCIUM 9.2 07/26/2014 1258  PROT 7.7 06/07/2014 1028   ALBUMIN 4.3 06/07/2014 1028   AST 21 06/07/2014 1028   ALT 19 06/07/2014 1028   ALKPHOS 86 06/07/2014 1028   BILITOT 1.2 06/07/2014 1028   GFRNONAA 88.11 11/16/2009 1045   GFRAA 92 09/08/2008 0000       Assessment & Plan:  74 year old female with long-standing history of GERD, IBS, adenomatous colon polyps, family history of colon cancer in sister who is seen for  follow-up.   1. GERD/esophageal spasm -- symptoms are consistent with reflux and esophageal spasm. No dysphagia. Omeprazole is not as effective as Nexium previously. We'll thus discontinue omeprazole and restart Nexium 40 mg daily. Given less efficacy of omeprazole hopefully insurance will now cover Nexium as it has proven to work better for her. I will start diltiazem 30 mg every 6 hours for esophageal spasm. This may need to be titrated up. We discussed dizziness, lightheadedness, muscle pain or weakness or fatigue, if this occurs with this medication let us know. I will have her take it around 4 PM and 10 PM to see if we can help with the esophageal spasm occurring mostly at night. Antireflux precautions. She is taking Carafate twice daily for many years and finds this helps thus she will continue.  2. History of adenomatous colon polyps and family history of colon cancer -- repeat colonoscopy recommended for May 2017. She is aware of this recommendation  Return in 3 months, sooner if necessary  25 minutes with the patient today

## 2015-03-22 DIAGNOSIS — Z01419 Encounter for gynecological examination (general) (routine) without abnormal findings: Secondary | ICD-10-CM | POA: Diagnosis not present

## 2015-03-22 DIAGNOSIS — Z1389 Encounter for screening for other disorder: Secondary | ICD-10-CM | POA: Diagnosis not present

## 2015-03-22 DIAGNOSIS — Z1231 Encounter for screening mammogram for malignant neoplasm of breast: Secondary | ICD-10-CM | POA: Diagnosis not present

## 2015-03-22 DIAGNOSIS — R8290 Unspecified abnormal findings in urine: Secondary | ICD-10-CM | POA: Diagnosis not present

## 2015-03-22 DIAGNOSIS — Z6826 Body mass index (BMI) 26.0-26.9, adult: Secondary | ICD-10-CM | POA: Diagnosis not present

## 2015-03-22 DIAGNOSIS — N8110 Cystocele, unspecified: Secondary | ICD-10-CM | POA: Insufficient documentation

## 2015-03-31 DIAGNOSIS — K130 Diseases of lips: Secondary | ICD-10-CM | POA: Diagnosis not present

## 2015-04-12 ENCOUNTER — Telehealth: Payer: Self-pay | Admitting: Pulmonary Disease

## 2015-04-12 NOTE — Telephone Encounter (Signed)
Per SN-send Cipro 250 mg #14 take 1 po BID no refills; also be sure pt is taking cranberry juice too. Thanks.      LMTCB

## 2015-04-12 NOTE — Telephone Encounter (Signed)
Pt called stating she feels like she has another UTI; discomfort in abdomen, burning with urination. Pt would like to have Rx sent to pharmacy to help. Pt has 6 month OV 06-08-15.  Ph[facility]: [facility], [place]  Allergies  Allergen Reactions  . Prednisone     REACTION: unable to take this med due to an eye condition  . Amoxicillin-Pot Clavulanate     REACTION: causes diarrhea  . Codeine     REACTION: nausea  . Levaquin [Levofloxacin In D5w]     Had itching w/ intravenous, not sure if reaction was from abx or the need to change the IV.  Does well when taken with Benadryl.  . Tdap [Diphth-Acell Pertussis-Tetanus]     Area raised, warm to touch, tender, swollen, pt felt jittery, nausea and some SOB  . Tape Rash    NEEDS PAPER TAPE   SN Please advise. Thanks.

## 2015-04-13 MED ORDER — CIPROFLOXACIN HCL 250 MG PO TABS: 250.0000 mg | ORAL_TABLET | Freq: Two times a day (BID) | ORAL | Status: DC

## 2015-04-13 NOTE — Telephone Encounter (Signed)
Spoke with the pt and notified of recs per SN  She verbalized understanding  Rx was sent

## 2015-04-14 DIAGNOSIS — K130 Diseases of lips: Secondary | ICD-10-CM | POA: Diagnosis not present

## 2015-05-13 ENCOUNTER — Other Ambulatory Visit: Payer: Self-pay | Admitting: Pulmonary Disease

## 2015-05-17 ENCOUNTER — Encounter: Payer: Self-pay | Admitting: *Deleted

## 2015-06-02 DIAGNOSIS — Z08 Encounter for follow-up examination after completed treatment for malignant neoplasm: Secondary | ICD-10-CM | POA: Diagnosis not present

## 2015-06-02 DIAGNOSIS — Z85828 Personal history of other malignant neoplasm of skin: Secondary | ICD-10-CM | POA: Diagnosis not present

## 2015-06-06 ENCOUNTER — Ambulatory Visit (INDEPENDENT_AMBULATORY_CARE_PROVIDER_SITE_OTHER): Payer: Medicare Other | Admitting: Internal Medicine

## 2015-06-06 ENCOUNTER — Encounter: Payer: Self-pay | Admitting: Internal Medicine

## 2015-06-06 VITALS — BP 138/74 | HR 80 | Ht 67.25 in | Wt 177.5 lb

## 2015-06-06 DIAGNOSIS — K219 Gastro-esophageal reflux disease without esophagitis: Secondary | ICD-10-CM | POA: Diagnosis not present

## 2015-06-06 DIAGNOSIS — K224 Dyskinesia of esophagus: Secondary | ICD-10-CM | POA: Diagnosis not present

## 2015-06-06 MED ORDER — SUCRALFATE 1 G PO TABS: 1.0000 g | ORAL_TABLET | Freq: Two times a day (BID) | ORAL | Status: DC

## 2015-06-06 MED ORDER — DILTIAZEM HCL 30 MG PO TABS: 30.0000 mg | ORAL_TABLET | Freq: Four times a day (QID) | ORAL | Status: DC

## 2015-06-06 NOTE — Patient Instructions (Signed)
Please continue Nexium 20 mg twice daily.  Continue as needed carafate and diltiazem.  Please follow up with Dr Hilarie Fredrickson in 1 year.  You will be due for a recall colonoscopy in 11/2015. We will send you a reminder in the mail when it gets closer to that time.

## 2015-06-06 NOTE — Progress Notes (Signed)
   Subjective:    Patient ID: Laura [name], female    DOB: Jan 02, 1941, 74 y.o.   MRN: [id number]  HPI [name] is a 74 year old female with long-standing GERD, IBS, adenomatous colon polyps, family history of colon cancer in her sister who is here for follow-up. Last seen on 03/09/2015. At that time she was having symptoms consistent with reflux and esophageal spasm. PPI was switched back to Nexium and diltiazem was prescribed for spasm. She reports she is taking Nexium 20 mg twice daily which she is buying over-the-counter. She is using Carafate on an as-needed basis only needing several times per week. Symptoms have dramatically improved. She is feeling well and happy with her overall symptom control. She's use diltiazem 3 or 4 times for esophageal spasm symptoms which we had previously discussed. This seems to help when she uses it. She has become more aware of her dietary triggers and also that stress triggers her reflux disease. She's been recently sleeping better. Bowel movements a been regular without blood in her stool or melena. No fevers, chills, nausea or vomiting. No chest pain or shortness of breath. No trouble with lower extremity edema.   Review of Systems As per history of present illness, otherwise negative  Current Medications, Allergies, Past Medical History, Past Surgical History, Family History and Social History were reviewed in [employer] record.     Objective:   Physical Exam BP 138/74 mmHg  Pulse 80  Ht 5' 7.25" (1.708 m)  Wt 177 lb 8 oz (80.513 kg)  BMI 27.60 kg/m2 Constitutional: Well-developed and well-nourished. No distress. HEENT: Normocephalic and atraumatic. Oropharynx is clear and moist. No oropharyngeal exudate. Conjunctivae are normal.  No scleral icterus. Neck: Neck supple. Trachea midline. Cardiovascular: Normal rate, regular rhythm and intact distal pulses.  Pulmonary/chest: Effort normal and breath sounds normal. No wheezing,  rales or rhonchi. Abdominal: Soft, nontender, nondistended. Bowel sounds active throughout.  Extremities: no clubbing, cyanosis, or edema Neurological: Alert and oriented to person place and time. Skin: Skin is warm and dry. No rashes noted. Psychiatric: Normal mood and affect. Behavior is normal.     Assessment & Plan:  74 year old female with long-standing GERD, IBS, adenomatous colon polyps, family history of colon cancer in her sister who is here for follow-up.  1. GERD/esophageal spasm -- fortunately symptoms have come under good control with Nexium. Continue Nexium 20 mg twice a day. Continue GERD diet and avoiding known triggers. She can use Carafate suspension 1 g before meals and at bedtime on an as-needed basis. She is currently using this very infrequently. Diltiazem can also be used as needed for soft gel spasm symptoms. Currently no evidence of esophagitis, dysphagia or odynophagia. She will follow-up in one year for this issue.  2. History of adenomatous colon polyps/family history of colon cancer -- last colonoscopy 2012, repeat recommended May 2017

## 2015-06-08 ENCOUNTER — Encounter: Payer: Self-pay | Admitting: Pulmonary Disease

## 2015-06-08 ENCOUNTER — Other Ambulatory Visit (INDEPENDENT_AMBULATORY_CARE_PROVIDER_SITE_OTHER): Payer: Medicare Other

## 2015-06-08 ENCOUNTER — Ambulatory Visit (INDEPENDENT_AMBULATORY_CARE_PROVIDER_SITE_OTHER): Payer: Medicare Other | Admitting: Pulmonary Disease

## 2015-06-08 VITALS — BP 126/70 | HR 70 | Temp 98.1°F | Wt 177.0 lb

## 2015-06-08 DIAGNOSIS — D869 Sarcoidosis, unspecified: Secondary | ICD-10-CM

## 2015-06-08 DIAGNOSIS — F411 Generalized anxiety disorder: Secondary | ICD-10-CM | POA: Diagnosis not present

## 2015-06-08 DIAGNOSIS — K222 Esophageal obstruction: Secondary | ICD-10-CM

## 2015-06-08 DIAGNOSIS — M159 Polyosteoarthritis, unspecified: Secondary | ICD-10-CM

## 2015-06-08 DIAGNOSIS — M8580 Other specified disorders of bone density and structure, unspecified site: Secondary | ICD-10-CM

## 2015-06-08 DIAGNOSIS — I059 Rheumatic mitral valve disease, unspecified: Secondary | ICD-10-CM

## 2015-06-08 DIAGNOSIS — K219 Gastro-esophageal reflux disease without esophagitis: Secondary | ICD-10-CM

## 2015-06-08 DIAGNOSIS — K588 Other irritable bowel syndrome: Secondary | ICD-10-CM

## 2015-06-08 DIAGNOSIS — E7800 Pure hypercholesterolemia, unspecified: Secondary | ICD-10-CM | POA: Diagnosis not present

## 2015-06-08 DIAGNOSIS — Z860100 Personal history of colon polyps, unspecified: Secondary | ICD-10-CM

## 2015-06-08 DIAGNOSIS — M150 Primary generalized (osteo)arthritis: Secondary | ICD-10-CM

## 2015-06-08 DIAGNOSIS — S32000D Wedge compression fracture of unspecified lumbar vertebra, subsequent encounter for fracture with routine healing: Secondary | ICD-10-CM

## 2015-06-08 LAB — BASIC METABOLIC PANEL
BUN: 11 mg/dL (ref 6–23)
CO2: 30 mEq/L (ref 19–32)
Calcium: 9.6 mg/dL (ref 8.4–10.5)
Chloride: 103 mEq/L (ref 96–112)
Creatinine, Ser: 0.68 mg/dL (ref 0.40–1.20)
GFR: 89.69 mL/min (ref >60.00)
Glucose, Bld: 99 mg/dL (ref 70–99)
POTASSIUM: 3.9 meq/L (ref 3.5–5.1)
SODIUM: 140 meq/L (ref 135–145)

## 2015-06-08 LAB — CBC WITH DIFFERENTIAL/PLATELET
BASOS PCT: 0.4 % (ref 0.0–3.0)
Basophils Absolute: 0 10*3/uL (ref 0.0–0.1)
EOS PCT: 2.1 % (ref 0.0–5.0)
Eosinophils Absolute: 0.1 10*3/uL (ref 0.0–0.7)
HEMATOCRIT: 45.7 % (ref 36.0–46.0)
HEMOGLOBIN: 15.3 g/dL — AB (ref 12.0–15.0)
Lymphocytes Relative: 30.9 % (ref 12.0–46.0)
Lymphs Abs: 1.8 10*3/uL (ref 0.7–4.0)
MCHC: 33.5 g/dL (ref 30.0–36.0)
MCV: 90.1 fl (ref 78.0–100.0)
MONO ABS: 0.4 10*3/uL (ref 0.1–1.0)
Monocytes Relative: 7.5 % (ref 3.0–12.0)
Neutro Abs: 3.4 10*3/uL (ref 1.4–7.7)
Neutrophils Relative %: 59.1 % (ref 43.0–77.0)
Platelets: 251 10*3/uL (ref 150.0–400.0)
RBC: 5.07 Mil/uL (ref 3.87–5.11)
RDW: 13.2 % (ref 11.5–15.5)
WBC: 5.7 10*3/uL (ref 4.0–10.5)

## 2015-06-08 LAB — LIPID PANEL
CHOL/HDL RATIO: 4
Cholesterol: 161 mg/dL (ref 0–200)
HDL: 44.1 mg/dL (ref >39.00)
LDL CALC: 87 mg/dL (ref 0–99)
NONHDL: 116.44
TRIGLYCERIDES: 146 mg/dL (ref 0.0–149.0)
VLDL: 29.2 mg/dL (ref 0.0–40.0)

## 2015-06-08 LAB — VITAMIN D 25 HYDROXY (VIT D DEFICIENCY, FRACTURES): VITD: 51.62 ng/mL (ref 30.00–100.00)

## 2015-06-08 LAB — HEPATIC FUNCTION PANEL
ALT: 14 U/L (ref 0–35)
AST: 17 U/L (ref 0–37)
Albumin: 4.2 g/dL (ref 3.5–5.2)
Alkaline Phosphatase: 67 U/L (ref 39–117)
BILIRUBIN TOTAL: 1.2 mg/dL (ref 0.2–1.2)
Bilirubin, Direct: 0.2 mg/dL (ref 0.0–0.3)
Total Protein: 7.3 g/dL (ref 6.0–8.3)

## 2015-06-08 LAB — TSH: TSH: 1.86 u[IU]/mL (ref 0.35–4.50)

## 2015-06-08 MED ORDER — ALENDRONATE SODIUM 70 MG PO TBEF: 1.0000 | EFFERVESCENT_TABLET | ORAL | Status: DC

## 2015-06-08 NOTE — Patient Instructions (Addendum)
Today we updated your med list in our EPIC system...    Continue your current medications the same...  Today we did your follow up FASTING blood work...    We will contact you w/ the results when available...   Continue your calcium, VitD, & wt bearing exercise...  We are trying a diff Fosam,ax-like med that is supposed to be easy on the GI tract...    Take the new BINOSTO as directed...       Dissolve 1 tab in water & drink it once weekly in the AM on empty stomach 7 wiat at least 46min before having breakfast...       Check their website for details  Call for any questions...  Let's plan a follow up visit in 49mo, sooner if needed for problems.Marland KitchenMarland Kitchen

## 2015-06-09 LAB — ANGIOTENSIN CONVERTING ENZYME: ANGIOTENSIN-CONVERTING ENZYME: 63 U/L — AB (ref 8–52)

## 2015-07-27 NOTE — Telephone Encounter (Signed)
SPOKE TO PT ABOUT RESULTS 

## 2015-08-05 ENCOUNTER — Telehealth: Payer: Self-pay | Admitting: Pulmonary Disease

## 2015-08-05 MED ORDER — CIPROFLOXACIN HCL 500 MG PO TABS: 250.0000 mg | ORAL_TABLET | Freq: Two times a day (BID) | ORAL | Status: DC

## 2015-08-05 NOTE — Telephone Encounter (Signed)
Spoke with pt, aware of recs.  rx sent to pharmacy.  Nothing further needed.  

## 2015-08-05 NOTE — Telephone Encounter (Signed)
Sending to DOD as this has not yet been addressed. MW please advise.  Thanks!

## 2015-08-05 NOTE — Telephone Encounter (Signed)
cipro 500 one half twice daily x 5 days (aware of levaquin "allergy")

## 2015-08-05 NOTE — Telephone Encounter (Signed)
Spoke with pt, c/o burning discomfort with urination, fullness and tightness in lower abdomen, believes she has a UTI Xseveral days. Pt requesting recs from SN Pt uses [facility] in [place]. [address]    SN please advise on recs.  Thanks!   Allergies  Allergen Reactions  . Prednisone     REACTION: unable to take this med due to an eye condition  . Amoxicillin-Pot Clavulanate     REACTION: causes diarrhea  . Codeine     REACTION: nausea  . Levaquin [Levofloxacin In D5w]     Had itching w/ intravenous, not sure if reaction was from abx or the need to change the IV.  Does well when taken with Benadryl.  . Tdap [Diphth-Acell Pertussis-Tetanus]     Area raised, warm to touch, tender, swollen, pt felt jittery, nausea and some SOB  . Tape Rash    NEEDS PAPER TAPE

## 2015-08-10 ENCOUNTER — Other Ambulatory Visit: Payer: Self-pay | Admitting: Pulmonary Disease

## 2015-09-13 DIAGNOSIS — Z79899 Other long term (current) drug therapy: Secondary | ICD-10-CM | POA: Diagnosis not present

## 2015-09-13 DIAGNOSIS — L639 Alopecia areata, unspecified: Secondary | ICD-10-CM | POA: Diagnosis not present

## 2015-10-10 ENCOUNTER — Encounter: Payer: Self-pay | Admitting: Internal Medicine

## 2015-10-16 ENCOUNTER — Emergency Department (HOSPITAL_BASED_OUTPATIENT_CLINIC_OR_DEPARTMENT_OTHER): Payer: Medicare Other

## 2015-10-16 ENCOUNTER — Encounter (HOSPITAL_BASED_OUTPATIENT_CLINIC_OR_DEPARTMENT_OTHER): Payer: Self-pay

## 2015-10-16 ENCOUNTER — Emergency Department (HOSPITAL_BASED_OUTPATIENT_CLINIC_OR_DEPARTMENT_OTHER)
Admission: EM | Admit: 2015-10-16 | Discharge: 2015-10-16 | Disposition: A | Discharge status: Home / Self Care | Payer: Medicare Other | Attending: Emergency Medicine | Admitting: Emergency Medicine

## 2015-10-16 DIAGNOSIS — J111 Influenza due to unidentified influenza virus with other respiratory manifestations: Secondary | ICD-10-CM

## 2015-10-16 DIAGNOSIS — Z86010 Personal history of colonic polyps: Secondary | ICD-10-CM | POA: Insufficient documentation

## 2015-10-16 DIAGNOSIS — Z880 Allergy status to penicillin: Secondary | ICD-10-CM | POA: Diagnosis not present

## 2015-10-16 DIAGNOSIS — Z792 Long term (current) use of antibiotics: Secondary | ICD-10-CM | POA: Diagnosis not present

## 2015-10-16 DIAGNOSIS — R509 Fever, unspecified: Secondary | ICD-10-CM | POA: Diagnosis not present

## 2015-10-16 DIAGNOSIS — Z8659 Personal history of other mental and behavioral disorders: Secondary | ICD-10-CM | POA: Insufficient documentation

## 2015-10-16 DIAGNOSIS — Z8679 Personal history of other diseases of the circulatory system: Secondary | ICD-10-CM | POA: Insufficient documentation

## 2015-10-16 DIAGNOSIS — Z8719 Personal history of other diseases of the digestive system: Secondary | ICD-10-CM | POA: Insufficient documentation

## 2015-10-16 DIAGNOSIS — R69 Illness, unspecified: Secondary | ICD-10-CM

## 2015-10-16 DIAGNOSIS — E7800 Pure hypercholesterolemia, unspecified: Secondary | ICD-10-CM | POA: Insufficient documentation

## 2015-10-16 DIAGNOSIS — Z8739 Personal history of other diseases of the musculoskeletal system and connective tissue: Secondary | ICD-10-CM | POA: Diagnosis not present

## 2015-10-16 DIAGNOSIS — Z79899 Other long term (current) drug therapy: Secondary | ICD-10-CM | POA: Diagnosis not present

## 2015-10-16 DIAGNOSIS — Z87440 Personal history of urinary (tract) infections: Secondary | ICD-10-CM | POA: Insufficient documentation

## 2015-10-16 DIAGNOSIS — R51 Headache: Secondary | ICD-10-CM | POA: Diagnosis present

## 2015-10-16 DIAGNOSIS — R05 Cough: Secondary | ICD-10-CM | POA: Diagnosis not present

## 2015-10-16 DIAGNOSIS — Z7982 Long term (current) use of aspirin: Secondary | ICD-10-CM | POA: Diagnosis not present

## 2015-10-16 LAB — INFLUENZA PANEL BY PCR (TYPE A & B)
H1N1 flu by pcr: NOT DETECTED
INFLAPCR: POSITIVE — AB
INFLBPCR: NEGATIVE

## 2015-10-16 NOTE — ED Notes (Signed)
Patient here with fatigue, headache, body aches, chills for 2 days. Reports that she was taking care of her sister who is now admitted with flu and pneumonia. No congestion but does have dry cough.

## 2015-10-16 NOTE — ED Provider Notes (Signed)
CSN: [id number]     Arrival date & time 10/16/15  [id number] History   First MD Initiated Contact with Patient 10/16/15 0919     Chief Complaint  Patient presents with  . Headache  . Generalized Body Aches     (Consider location/radiation/quality/duration/timing/severity/associated sxs/prior Treatment) Patient is a 75 y.o. female presenting with headaches and general illness. The history is provided by the patient.  Headache Associated symptoms: congestion, cough and fever   Associated symptoms: no dizziness, no myalgias, no nausea and no vomiting   Illness Severity:  Mild Onset quality:  Sudden Duration:  2 days Timing:  Constant Progression:  Worsening Chronicity:  New Associated symptoms: congestion, cough, fever, headaches and rhinorrhea   Associated symptoms: no chest pain, no myalgias, no nausea, no shortness of breath, no vomiting and no wheezing    75 yo F With a chief complaints of URI like symptoms. Going on for the past couple days. Patient has been in contact with her sister who tested positive for the flu and is now in the stepdown unit for pneumonia. Patient denies any shortness of breath. Mild cough. Started yesterday. Slowly worsening.  Past Medical History  Diagnosis Date  . Uveitis   . Sarcoidosis ([place])   . MVP (mitral valve prolapse)   . Hypercholesterolemia   . GERD (gastroesophageal reflux disease)   . IBS (irritable bowel syndrome)   . Personal history of colonic polyps     SESSILE SERRATED ADENOMAS (X2)  . Urinary tract infection, site not specified   . Osteopenia   . Anxiety   . DJD (degenerative joint disease)   . Diverticulosis of colon (without mention of hemorrhage)   . Hiatal hernia   . Stricture and stenosis of esophagus   . Unspecified gastritis and gastroduodenitis without mention of hemorrhage   . Allergy     SEASONAL  . Colon polyps   . Shortness of breath dyspnea   . Dysrhythmia     pac  . PONV (postoperative nausea and vomiting)   .  Esophagitis   . Adenomatous colon polyp     sessile   Past Surgical History  Procedure Laterality Date  . Vesicovaginal fistula closure w/ tah    . Basal cell carcinoma excision    . Cataract extraction      right  . Removal of mass from rt antecubital fossa    . Abdominal hysterectomy    . Upper gastrointestinal endoscopy    . Colonoscopy  2012  . Mass excision Left 11/02/2014    Procedure: EXCISION MASS LEFT ELBOW;  Surgeon: Daryll Brod, MD;  Location: [facility];  Service: Orthopedics;  Laterality: Left;   Family History  Problem Relation Age of Onset  . Sarcoidosis Sister   . Sarcoidosis Brother   . Other      nephew with Wegener's   . Cancer Mother     ? type  . Colon cancer Sister   . Stroke Sister    Social History  Substance Use Topics  . Smoking status: Never Smoker   . Smokeless tobacco: Never Used  . Alcohol Use: 0.6 oz/week    1 Glasses of wine per week     Comment: occ wine    OB History    No data available     Review of Systems  Constitutional: Positive for fever and chills.  HENT: Positive for congestion and rhinorrhea.   Eyes: Negative for redness and visual disturbance.  Respiratory: Positive for cough.  Negative for shortness of breath and wheezing.   Cardiovascular: Negative for chest pain and palpitations.  Gastrointestinal: Negative for nausea and vomiting.  Genitourinary: Negative for dysuria and urgency.  Musculoskeletal: Negative for myalgias and arthralgias.  Skin: Negative for pallor and wound.  Neurological: Positive for headaches. Negative for dizziness.      Allergies  Prednisone; Amoxicillin-pot clavulanate; Codeine; Levaquin; Tdap; and Tape  Home Medications   Prior to Admission medications   Medication Sig Start Date End Date Taking? Authorizing Provider  alendronate (FOSAMAX) 70 MG tablet Take 1 tablet (70 mg total) by mouth once a week. Take with a full glass of water on an empty stomach. Patient not taking:  Reported on 06/08/2015 09/14/14   Noralee Space, MD  Alendronate Sodium (BINOSTO) 70 MG TBEF Take 1 tablet by mouth once a week. 06/08/15   Noralee Space, MD  aspirin 81 MG tablet Take 81 mg by mouth daily.      Historical Provider, MD  B Complex Vitamins (VITAMIN-B COMPLEX PO) Take 1 capsule by mouth daily.     Historical Provider, MD  Calcium Carbonate (CALTRATE 600 PO) Take 1 tablet by mouth 2 (two) times daily.      Historical Provider, MD  Cholecalciferol (VITAMIN D) 1000 UNITS capsule Take 1,000 Units by mouth daily.      Historical Provider, MD  ciprofloxacin (CIPRO) 500 MG tablet Take 0.5 tablets (250 mg total) by mouth 2 (two) times daily. 08/05/15   Tanda Rockers, MD  diltiazem (CARDIZEM) 30 MG tablet Take 1 tablet (30 mg total) by mouth every 6 (six) hours. For esophageal spasm 06/06/15   Jerene Bears, MD  Esomeprazole Magnesium (NEXIUM 24HR) 20 MG TBEC Take 1 tablet by mouth 2 (two) times daily.    Historical Provider, MD  Glucosamine HCl (GLUCOSAMINE PO) Liquid form - Take by mouth as directed once daily    Historical Provider, MD  meclizine (ANTIVERT) 25 MG tablet Take 1/2-1 tablet by mouth every 4 hours as needed for dizziness 06/03/13   Noralee Space, MD  Multiple Vitamins-Minerals (MULTIVITAL) tablet Take 1 tablet by mouth daily.      Historical Provider, MD  Omega-3 Fatty Acids (FISH OIL) 1000 MG CAPS Take 1 capsule by mouth daily.      Historical Provider, MD  ondansetron (ZOFRAN) 8 MG tablet 1 tablet by mouth every 6 hours as needed for nausea 06/03/13   Noralee Space, MD  simvastatin (ZOCOR) 40 MG tablet TAKE 1 TABLET BY MOUTH EVERY NIGHT AT BEDTIME 08/10/15   Noralee Space, MD  sucralfate (CARAFATE) 1 G tablet Take 1 tablet (1 g total) by mouth 2 (two) times daily. 06/06/15   Jerene Bears, MD   BP 168/70 mmHg  Pulse 102  Temp(Src) 99.2 F (37.3 C) (Oral)  Resp 20  Ht 5' 8.5" (1.74 m)  Wt 172 lb (78.019 kg)  BMI 25.77 kg/m2  SpO2 96% Physical Exam  Constitutional: She is  oriented to person, place, and time. She appears well-developed and well-nourished. No distress.  HENT:  Head: Normocephalic and atraumatic.  Swollen turbinates, posterior nasal drip, no noted sinus ttp, tm normal bilaterally.    Eyes: EOM are normal. Pupils are equal, round, and reactive to light.  Neck: Normal range of motion. Neck supple.  Cardiovascular: Normal rate and regular rhythm.  Exam reveals no gallop and no friction rub.   No murmur heard. Pulmonary/Chest: Effort normal. She has no wheezes. She has no  rales.  Abdominal: Soft. She exhibits no distension. There is no tenderness.  Musculoskeletal: She exhibits no edema or tenderness.  Neurological: She is alert and oriented to person, place, and time.  Skin: Skin is warm and dry. She is not diaphoretic.  Psychiatric: She has a normal mood and affect. Her behavior is normal.  Nursing note and vitals reviewed.   ED Course  Procedures (including critical care time) Labs Review Labs Reviewed  INFLUENZA PANEL BY PCR (TYPE A & B, H1N1)    Imaging Review Dg Chest 2 View  10/16/2015  CLINICAL DATA:  Pt with cough, fever, flu-like symptoms x 1-2 days, hx sarcoid in right lung per pt being followed by pulmonary physician No previous surgeries to heart or lungs EXAM: CHEST  2 VIEW COMPARISON:  12/06/2014, 08/21/2013 FINDINGS: Normal cardiac silhouette. Bilateral hilar adenopathy again demonstrated. There is curvilinear thickening in RIGHT perihilar lung shunt present on comparison exams. The thickening appears progressed from 08/21/2013. No focal infiltrate pneumothorax. IMPRESSION: 1. Adenopathy and perihilar bronchial thickening consistent with sarcoidosis. 2. RIGHT perihilar occurred nodular thickening is progressed. Consider non emergent CT thorax for evaluation 3.  No acute cardiopulmonary process. Electronically Signed   By: Suzy Bouchard M.D.   On: 10/16/2015 10:03   I have personally reviewed and evaluated these images and lab  results as part of my medical decision-making.   EKG Interpretation None      MDM   Final diagnoses:  Influenza-like illness    75 yo F with a chief complaint of flulike symptoms. Patient is well-appearing and nontoxic. Chest x-ray is negative for pneumonia. We'll treat the patient symptomatically. PCP follow-up.  10:12 AM:  I have discussed the diagnosis/risks/treatment options with the patient and believe the pt to be eligible for discharge home to follow-up with PCP. We also discussed returning to the ED immediately if new or worsening sx occur. We discussed the sx which are most concerning (e.g., sudden worsening sob, sudden worsening of fever, inability to tolerate by mouth ) that necessitate immediate return. Medications administered to the patient during their visit and any new prescriptions provided to the patient are listed below.  Medications given during this visit Medications - No data to display  New Prescriptions   No medications on file    The patient appears reasonably screen and/or stabilized for discharge and I doubt any other medical condition or other [facility] requiring further screening, evaluation, or treatment in the ED at this time prior to discharge.      Deno Etienne, DO 10/16/15 1012

## 2015-10-16 NOTE — Discharge Instructions (Signed)
Take tylenol 2 pills 4 times a day and motrin 4 pills 3 times a day.  Drink plenty of fluids.  Return for worsening shortness of breath, headache, confusion. Follow up with your family doctor.   Influenza, Adult Influenza ("the flu") is a viral infection of the respiratory tract. It occurs more often in winter months because people spend more time in close contact with one another. Influenza can make you feel very sick. Influenza easily spreads from person to person (contagious). CAUSES  Influenza is caused by a virus that infects the respiratory tract. You can catch the virus by breathing in droplets from an infected person's cough or sneeze. You can also catch the virus by touching something that was recently contaminated with the virus and then touching your mouth, nose, or eyes. RISKS AND COMPLICATIONS You may be at risk for a more severe case of influenza if you smoke cigarettes, have diabetes, have chronic heart disease (such as heart failure) or lung disease (such as asthma), or if you have a weakened immune system. Elderly people and pregnant women are also at risk for more serious infections. The most common problem of influenza is a lung infection (pneumonia). Sometimes, this problem can require emergency medical care and may be life threatening. SIGNS AND SYMPTOMS  Symptoms typically last 4 to 10 days and may include:  Fever.  Chills.  Headache, body aches, and muscle aches.  Sore throat.  Chest discomfort and cough.  Poor appetite.  Weakness or feeling tired.  Dizziness.  Nausea or vomiting. DIAGNOSIS  Diagnosis of influenza is often made based on your history and a physical exam. A nose or throat swab test can be done to confirm the diagnosis. TREATMENT  In mild cases, influenza goes away on its own. Treatment is directed at relieving symptoms. For more severe cases, your health care provider may prescribe antiviral medicines to shorten the sickness. Antibiotic medicines  are not effective because the infection is caused by a virus, not by bacteria. HOME CARE INSTRUCTIONS  Take medicines only as directed by your health care provider.  Use a cool mist humidifier to make breathing easier.  Get plenty of rest until your temperature returns to normal. This usually takes 3 to 4 days.  Drink enough fluid to keep your urine clear or pale yellow.  Cover yourmouth and nosewhen coughing or sneezing,and wash your handswellto prevent thevirusfrom spreading.  Stay homefromwork orschool untilthe fever is gonefor at least 45full day. PREVENTION  An annual influenza vaccination (flu shot) is the best way to avoid getting influenza. An annual flu shot is now routinely recommended for all adults in the [place] IF:  You experiencechest pain, yourcough worsens,or you producemore mucus.  Youhave nausea,vomiting, ordiarrhea.  Your fever returns or gets worse. SEEK IMMEDIATE MEDICAL CARE IF:  You havetrouble breathing, you become short of breath,or your skin ornails becomebluish.  You have severe painor stiffnessin the neck.  You develop a sudden headache, or pain in the face or ear.  You have nausea or vomiting that you cannot control. MAKE SURE YOU:   Understand these instructions.  Will watch your condition.  Will get help right away if you are not doing well or get worse.   This information is not intended to replace advice given to you by your health care provider. Make sure you discuss any questions you have with your health care provider.   Document Released: 07/06/2000 Document Revised: 07/30/2014 Document Reviewed: 10/08/2011 Elsevier Interactive Patient Education  Education ©2016 [employer] Inc. ° °

## 2015-10-18 ENCOUNTER — Ambulatory Visit (INDEPENDENT_AMBULATORY_CARE_PROVIDER_SITE_OTHER): Payer: Medicare Other | Admitting: Adult Health

## 2015-10-18 ENCOUNTER — Encounter: Payer: Self-pay | Admitting: Adult Health

## 2015-10-18 VITALS — BP 126/64 | HR 82 | Temp 98.3°F | Ht 68.0 in | Wt 175.0 lb

## 2015-10-18 DIAGNOSIS — J111 Influenza due to unidentified influenza virus with other respiratory manifestations: Secondary | ICD-10-CM

## 2015-10-18 DIAGNOSIS — D869 Sarcoidosis, unspecified: Secondary | ICD-10-CM | POA: Diagnosis not present

## 2015-10-18 MED ORDER — AZITHROMYCIN 250 MG PO TABS: ORAL_TABLET | ORAL | Status: AC

## 2015-10-18 MED ORDER — BENZONATATE 200 MG PO CAPS: 200.0000 mg | ORAL_CAPSULE | Freq: Three times a day (TID) | ORAL | Status: DC | PRN

## 2015-10-18 NOTE — Patient Instructions (Signed)
Mucinex Twice daily  As needed  Cough/congestion  Delsym 2 tsp Twice daily  As needed  Cough  Tessalon Three times a day  As needed  Cough .  Push fluids .  Advance diet as tolerated.  Tylenol As needed   Zpack to have on hold if symptoms worsen with discolored mucus.  Please contact office for sooner follow up if symptoms do not improve or worsen or seek emergency care  follow up with Dr. Lenna Gilford  As planned and As needed

## 2015-10-18 NOTE — Progress Notes (Signed)
Subjective:    Patient ID: Laura [name], female    DOB: 1941/05/19, 75 y.o.   MRN: [id number]  HPI 75 yo female never smoker with Sarcoid with hx of Uveitis , Dyslipidemia , DJD and GERD   10/18/2015 Acute OV  Pt presents for an acute office visit. Complains of 3 days of dry cough, sinus pressure, fever at times, SOB and wheezing starting on 10/16/15. Seen in ER on 3/26 dx with Inlfuenza with positive flu swab.  CXR showed chronic sarcoid changes, no PNA.   Denies any chest congestion/tightness, sinus drainage, nausea or vomiting.  Sister had the flu . Appetite is lower. Drinking fluids . Eating some but not a lot.  Cough is worse at night .  No chest pain, orthopnea, edema or hemoptysis .     Past Medical History  Diagnosis Date  . Uveitis   . Sarcoidosis ([place])   . MVP (mitral valve prolapse)   . Hypercholesterolemia   . GERD (gastroesophageal reflux disease)   . IBS (irritable bowel syndrome)   . Personal history of colonic polyps     SESSILE SERRATED ADENOMAS (X2)  . Urinary tract infection, site not specified   . Osteopenia   . Anxiety   . DJD (degenerative joint disease)   . Diverticulosis of colon (without mention of hemorrhage)   . Hiatal hernia   . Stricture and stenosis of esophagus   . Unspecified gastritis and gastroduodenitis without mention of hemorrhage   . Allergy     SEASONAL  . Colon polyps   . Shortness of breath dyspnea   . Dysrhythmia     pac  . PONV (postoperative nausea and vomiting)   . Esophagitis   . Adenomatous colon polyp     sessile   Current Outpatient Prescriptions on File Prior to Visit  Medication Sig Dispense Refill  . alendronate (FOSAMAX) 70 MG tablet Take 1 tablet (70 mg total) by mouth once a week. Take with a full glass of water on an empty stomach. 4 tablet 6  . Alendronate Sodium (BINOSTO) 70 MG TBEF Take 1 tablet by mouth once a week. 4 tablet prn  . aspirin 81 MG tablet Take 81 mg by mouth daily.      . B Complex Vitamins  (VITAMIN-B COMPLEX PO) Take 1 capsule by mouth daily.     . Calcium Carbonate (CALTRATE 600 PO) Take 1 tablet by mouth 2 (two) times daily.      . ciprofloxacin (CIPRO) 500 MG tablet Take 0.5 tablets (250 mg total) by mouth 2 (two) times daily. 5 tablet 0  . diltiazem (CARDIZEM) 30 MG tablet Take 1 tablet (30 mg total) by mouth every 6 (six) hours. For esophageal spasm 60 tablet 2  . Esomeprazole Magnesium (NEXIUM 24HR) 20 MG TBEC Take 1 tablet by mouth 2 (two) times daily.    . Glucosamine HCl (GLUCOSAMINE PO) Liquid form - Take by mouth as directed once daily    . meclizine (ANTIVERT) 25 MG tablet Take 1/2-1 tablet by mouth every 4 hours as needed for dizziness    . Multiple Vitamins-Minerals (MULTIVITAL) tablet Take 1 tablet by mouth daily.      . Omega-3 Fatty Acids (FISH OIL) 1000 MG CAPS Take 1 capsule by mouth daily.      . ondansetron (ZOFRAN) 8 MG tablet 1 tablet by mouth every 6 hours as needed for nausea 25 tablet 5  . simvastatin (ZOCOR) 40 MG tablet TAKE 1 TABLET BY MOUTH EVERY  NIGHT AT BEDTIME 90 tablet 0  . sucralfate (CARAFATE) 1 G tablet Take 1 tablet (1 g total) by mouth 2 (two) times daily. 60 tablet 2   No current facility-administered medications on file prior to visit.    Review of Systems Constitutional:   No  weight loss, night sweats,   +Fevers, chills, fatigue, or  lassitude.  HEENT:   No headaches,  Difficulty swallowing,  Tooth/dental problems, or  Sore throat,                No sneezing, itching, ear ache, nasal congestion, post nasal drip,   CV:  No chest pain,  Orthopnea, PND, swelling in lower extremities, anasarca, dizziness, palpitations, syncope.   GI  No heartburn, indigestion, abdominal pain, nausea, vomiting, diarrhea, change in bowel habits, loss of appetite, bloody stools.   Resp:  .  No chest wall deformity  Skin: no rash or lesions.  GU: no dysuria, change in color of urine, no urgency or frequency.  No flank pain, no hematuria   MS:  No  joint pain or swelling.  No decreased range of motion.  No back pain.  Psych:  No change in mood or affect. No depression or anxiety.  No memory loss.          Objective:   Physical Exam  Filed Vitals:   10/18/15 1122  BP: 126/64  Pulse: 82  Temp: 98.3 F (36.8 C)  TempSrc: Oral  Height: 5\' 8"  (1.727 m)  Weight: 175 lb (79.379 kg)  SpO2: 93%   GEN: A/Ox3; pleasant , NAD , frail   HEENT:  [place]/AT,  EACs-clear, TMs-wnl, NOSE-clear, THROAT-clear, no lesions, no postnasal drip or exudate noted.   NECK:  Supple w/ fair ROM; no JVD; normal carotid impulses w/o bruits; no thyromegaly or nodules palpated; no lymphadenopathy.  RESP  Clear  P & A; w/o, wheezes/ rales/ or rhonchi.no accessory muscle use, no dullness to percussion  CARD:  RRR, no m/r/g  , no peripheral edema, pulses intact, no cyanosis or clubbing.  GI:   Soft & nt; nml bowel sounds; no organomegaly or masses detected.  Musco: Warm bil, no deformities or joint swelling noted.   Neuro: alert, no focal deficits noted.    Skin: Warm, no lesions or rashes   Starlit Raburn NP-C  [facility]  10/18/2015     Assessment & Plan:

## 2015-10-18 NOTE — Assessment & Plan Note (Signed)
Stable without flare   

## 2015-10-18 NOTE — Assessment & Plan Note (Signed)
Influenza with URI   Plan  Mucinex Twice daily  As needed  Cough/congestion  Delsym 2 tsp Twice daily  As needed  Cough  Tessalon Three times a day  As needed  Cough .  Push fluids .  Advance diet as tolerated.  Tylenol As needed   Zpack to have on hold if symptoms worsen with discolored mucus.  Please contact office for sooner follow up if symptoms do not improve or worsen or seek emergency care  follow up with Dr. Lenna Gilford  As planned and As needed

## 2015-11-01 ENCOUNTER — Other Ambulatory Visit: Payer: Self-pay | Admitting: Pulmonary Disease

## 2015-11-09 ENCOUNTER — Encounter: Payer: Self-pay | Admitting: Internal Medicine

## 2015-11-09 DIAGNOSIS — L42 Pityriasis rosea: Secondary | ICD-10-CM | POA: Diagnosis not present

## 2015-11-10 ENCOUNTER — Telehealth: Payer: Self-pay | Admitting: Pulmonary Disease

## 2015-11-10 MED ORDER — CIPROFLOXACIN HCL 250 MG PO TABS: 250.0000 mg | ORAL_TABLET | Freq: Two times a day (BID) | ORAL | Status: DC

## 2015-11-10 NOTE — Telephone Encounter (Signed)
Spoke with pt and advised of Dr Nadel's recommendations.  Rx sent to pharmacy. 

## 2015-11-10 NOTE — Telephone Encounter (Signed)
Pt c/o low back and pelvic discomfort, frequency of urination with slight burning.  Denies odor, discoloration or fever.  Please advise.  Allergies  Allergen Reactions  . Prednisone     REACTION: unable to take this med due to an eye condition  . Amoxicillin-Pot Clavulanate     REACTION: causes diarrhea  . Codeine     REACTION: nausea  . Levaquin [Levofloxacin In D5w]     Had itching w/ intravenous, not sure if reaction was from abx or the need to change the IV.  Does well when taken with Benadryl.  . Tdap [Diphth-Acell Pertussis-Tetanus]     Area raised, warm to touch, tender, swollen, pt felt jittery, nausea and some SOB  . Tape Rash    NEEDS PAPER TAPE    Current Outpatient Prescriptions on File Prior to Visit  Medication Sig Dispense Refill  . alendronate (FOSAMAX) 70 MG tablet Take 1 tablet (70 mg total) by mouth once a week. Take with a full glass of water on an empty stomach. 4 tablet 6  . Alendronate Sodium (BINOSTO) 70 MG TBEF Take 1 tablet by mouth once a week. 4 tablet prn  . aspirin 81 MG tablet Take 81 mg by mouth daily.      . B Complex Vitamins (VITAMIN-B COMPLEX PO) Take 1 capsule by mouth daily.     . benzonatate (TESSALON) 200 MG capsule Take 1 capsule (200 mg total) by mouth 3 (three) times daily as needed for cough. 30 capsule 1  . Calcium Carbonate (CALTRATE 600 PO) Take 1 tablet by mouth 2 (two) times daily.      [name] diltiazem (CARDIZEM) 30 MG tablet Take 1 tablet (30 mg total) by mouth every 6 (six) hours. For esophageal spasm 60 tablet 2  . Esomeprazole Magnesium (NEXIUM 24HR) 20 MG TBEC Take 1 tablet by mouth 2 (two) times daily.    . Glucosamine HCl (GLUCOSAMINE PO) Liquid form - Take by mouth as directed once daily    . meclizine (ANTIVERT) 25 MG tablet Take 1/2-1 tablet by mouth every 4 hours as needed for dizziness    . Multiple Vitamins-Minerals (MULTIVITAL) tablet Take 1 tablet by mouth daily.      . Omega-3 Fatty Acids (FISH OIL) 1000 MG CAPS Take 1  capsule by mouth daily.      . ondansetron (ZOFRAN) 8 MG tablet 1 tablet by mouth every 6 hours as needed for nausea 25 tablet 5  . simvastatin (ZOCOR) 40 MG tablet TAKE 1 TABLET BY MOUTH EVERY NIGHT AT BEDTIME 90 tablet 1  . sucralfate (CARAFATE) 1 G tablet Take 1 tablet (1 g total) by mouth 2 (two) times daily. 60 tablet 2   No current facility-administered medications on file prior to visit.

## 2015-11-10 NOTE — Telephone Encounter (Signed)
Per SN:  Cipro 250mg  #14, 1 PO BID. Thanks.

## 2015-12-06 ENCOUNTER — Ambulatory Visit: Payer: Medicare Other | Admitting: Pulmonary Disease

## 2015-12-13 ENCOUNTER — Ambulatory Visit: Payer: Medicare Other | Admitting: Pulmonary Disease

## 2015-12-26 ENCOUNTER — Encounter: Payer: Self-pay | Admitting: Pulmonary Disease

## 2015-12-26 ENCOUNTER — Ambulatory Visit (INDEPENDENT_AMBULATORY_CARE_PROVIDER_SITE_OTHER): Payer: Medicare Other | Admitting: Pulmonary Disease

## 2015-12-26 ENCOUNTER — Other Ambulatory Visit (INDEPENDENT_AMBULATORY_CARE_PROVIDER_SITE_OTHER): Payer: Medicare Other

## 2015-12-26 VITALS — BP 126/74 | HR 70 | Ht 68.0 in | Wt 171.2 lb

## 2015-12-26 DIAGNOSIS — M150 Primary generalized (osteo)arthritis: Secondary | ICD-10-CM

## 2015-12-26 DIAGNOSIS — K219 Gastro-esophageal reflux disease without esophagitis: Secondary | ICD-10-CM | POA: Diagnosis not present

## 2015-12-26 DIAGNOSIS — E7800 Pure hypercholesterolemia, unspecified: Secondary | ICD-10-CM | POA: Diagnosis not present

## 2015-12-26 DIAGNOSIS — F411 Generalized anxiety disorder: Secondary | ICD-10-CM

## 2015-12-26 DIAGNOSIS — Z860100 Personal history of colon polyps, unspecified: Secondary | ICD-10-CM

## 2015-12-26 DIAGNOSIS — S32000D Wedge compression fracture of unspecified lumbar vertebra, subsequent encounter for fracture with routine healing: Secondary | ICD-10-CM

## 2015-12-26 DIAGNOSIS — M8580 Other specified disorders of bone density and structure, unspecified site: Secondary | ICD-10-CM

## 2015-12-26 DIAGNOSIS — I059 Rheumatic mitral valve disease, unspecified: Secondary | ICD-10-CM

## 2015-12-26 DIAGNOSIS — D869 Sarcoidosis, unspecified: Secondary | ICD-10-CM

## 2015-12-26 DIAGNOSIS — K588 Other irritable bowel syndrome: Secondary | ICD-10-CM

## 2015-12-26 DIAGNOSIS — K222 Esophageal obstruction: Secondary | ICD-10-CM

## 2015-12-26 DIAGNOSIS — M159 Polyosteoarthritis, unspecified: Secondary | ICD-10-CM

## 2015-12-26 LAB — VITAMIN D 25 HYDROXY (VIT D DEFICIENCY, FRACTURES): VITD: 50.99 ng/mL (ref 30.00–100.00)

## 2015-12-26 NOTE — Patient Instructions (Signed)
Today we updated your med list in our EPIC system...    Continue your current medications the same...  Today we checked a follow up ACE level to check your Sarcoid activity...    We will contact you w/ the results when available...   Keep up the good work w/ diet/ exercise/ wt reduction...  Call for any questions...  Let's plan a follow up visit in 63mo, sooner if needed for problems.Marland KitchenMarland Kitchen

## 2015-12-26 NOTE — Progress Notes (Signed)
Subjective:    Patient ID: Laura [name], female    DOB: 01/17/1941, 75 y.o.   MRN: [id number]  HPI 75 y/o WF here for a follow up visit... she has prob Sarcoidosis w/ hx Uveitis and BHA on CXR... we are following a "watchful waiting" protocol...she continues to feel well- no cough, sputum, CP, or SOB...   ~  Nov 21, 2011:  62mo ROV & Laura [name] is doing reasonably well w/o new complaints or concerns;     She was given Zithromax for an ear infection from an [facility] in [place]; 3/13 she developed an "irritation in my esoph" dyspagia/ odynophagia; EGD by DrPatterson revealed "Pill esophagitis" & she was treated w/ Carafate (off now), Nexium Bid, & Zantac 300Qhs;  improved now on & she will check w/ GI about a formulary PPI med...    We reviewed prob list, meds, xrays, & labs> see below>>   LABS 5/13:  FLP- at goals on Simva40;  Chems- wnl;  CBC- wnl;  TSH=1.61;  VitD=47  ADDENDUM>> she received TDAP w/ local reaction req antihist & topical Rx...  ~  May 23, 2012:  34mo ROV & Laura [name] reports some vertigo while on vacation recently & improved w/ Antivert...    Sarcoid> Hx abn CXR, she is asymptomatic; CXR today= right suprahilar nodule (likely sarcoid scarring) & chr changes...    MVP> on ASA81;  She denies CP, palpit, SOB, etc...     Chol> on Simva40; FLP 5/13 looked ok w/ TChol 159, TG 151, HDL 47, LDL 82; reviewed diet & wt reduction...    GI> GERD, IBS, Polyp> on Nexium40, Zantac300HS, CarafateQid prn, & Zofran prn;     UTIs> she reports no prob on Cranberry juice...    DJD, LBP, Osteop> on calium, MVI, VitD; we refilled Tramadol for prn use...    Anxiety> she has a lot of family support, doesn't require meds... We reviewed prob list, meds, xrays and labs> see below for updates >> OK Flu shot today...  CXR 11/13 showed similar changes- irreg nodular changes in right suprahilar region, scarring, stable adenopathy, mild atherosclerotic calcif, osteopenia...   ~  Dec 02, 2012:  68mo ROV & Laura [name] reports  that in Feb2014 she fell while getting up at night & hit her back w/ severe pain & L1 compression fx- adm to [facility] for 2d w/ kyphoplasty & good pain relief; also told to have LLLpneumonia treated w/ Levaquin; she had f/u here by TP 3/14 & improved; since then she is back to normal- had nice [place] vacation, feeling well, etc...  We reviewed the following medical problems during today's office visit >>     Sarcoid> Hx abn CXR, she is asymptomatic; baseline CXR w/ right suprahilar nodule, adenop, scarring & chr changes; ?LLLpneum D34-534 in [place] treated w/ Levaquin...    MVP> on ASA81;  She denies CP, palpit, SOB, etc...     Chol> on Simva40; FLP 5/14 showed TChol 165, TG 157, HDL 41, LDL 93; reviewed diet & wt reduction...    GI> GERD, IBS, Polyp> on Nexium40Bid, Zantac300HS, CarafateQid prn, & Zofran prn; she remains asymptomatic w/o abd pain, n/v, c/d, blood seen...    UTIs> she reports no prob on Cranberry juice...    DJD, LBP, L1 compression w/ kyphoplasty, Osteop> on calium, MVI, VitD; we refilled Tramadol for prn use; she fell 2/14 w/ L1 compression & kyphoplasty in [place]; she needs f/u BMD.    Anxiety> she has a lot of family support,  doesn't require meds... We reviewed prob list, meds, xrays and labs> see below for updates >>   CXR 3/14 showed normal heart size, atherosclerotic Ao, chr scarring from underlying sarcoid, NAD...  LABS 5/14:  FLP- at goals on Simva40;  Chems- wnl;  CBC- wnl;  TSH=1.74;  VitD=52...  ~  June 03, 2013:  53mo ROV & Laura [name] has had some prob w/ pain & swelling in right ankle- she has appt w/ Ortho this afternoon (DrDaldorf)...     Hx prob Sarcoid and abn CXR; she remains asymptomatic & last film 3/14 showed stable scarring, NAD...     She remains on Simva40 for Chol; Labs 5/14 looked ok & we reviewed diet, exercise, & wt reduction strategies...    She has Hx GERD, IBS, colon polyps> on Nexium40Qam, Zantac300Qhs, Carafate 1gmQidPrn & doing fine w/o  abd apin, dysphagia, n/v, c/d, blood seen; last colon was 5/12...    She has hx DJD, LBP, L1 compression w/ kyphoplasty; on Tramadol prn, takes calcium, MVI, VitD; last BMD was done here 5/11 w/ TScores -1.2 in Spine and FemNecks... We reviewed prob list, meds, xrays and labs> see below for updates >> she had the 2014 flu vaccine in Oct...   ~  Dec 09, 2013:  70mo ROV & Laura [name] saw TP several months ago c/o dyspnea and fatigue> she's been sedentary due to foot problem & when she tried to walk she noted trouble w/ hills, no CP, etc; hx sarcoid but she has never required treatment as it has been felt to be inactive- f/u CXR unchanged; felt to likely be deconditioning but referred to Cards for their eval as well...    Seen by DrTaylor for [employer] 2DEcho showed norm LVF, norm valves, no signs of pulmHTN; subdeq stress testing showed extreme dyspnea w/ exercise (poor functional capacity), no ischemia; she has been encouraged to join silver sneakers and incr her exercise program...     Seen by DrPyrtle for GI> c/o epig discomfort, hx GERD/ IBS/ colon polyps; symptoms improved by incr Nexium to Bid & adding Carafate prn; she is up to date on screening...  We reviewed prob list, meds, xrays and labs> see below for updates >>   LABS 1/15:  Chems- ok x K=3.4;  CBC- wnl;  TSH=2.28;  Mag=2.0.Marland KitchenMarland Kitchen  CXR 1/15 showed norm heart size, areas of scarring appear unchanged, prom hilae w/o change- stable, NAD.Marland KitchenMarland Kitchen   EKG 3/15 showed NSR, rate79, wnl, NAD...  Stress Test 3/15 showed poor exercise capacity, no ischemia...  2DEcho 3/15 showed normal LVF w/ EF=55-60%, no regional wall motion abn, normal valves, no evid for pulmHTN...  PFTs 5/15 showed just some sm airways dis> FVC=2.65 (78%), FEV1=1.86 (72%); %1sec=70; mid-flows=55% predicted... After bronchodil her FEV1 improved 6%... Lung Volumes & DLCO are wnl...   LABS 5/15:  ACE level = 55     ~  June 07, 2014:  22mo ROV & Laura [name] is improved> she has been exercising at  home 2-3d/wk & [facility] 2[facility]; she notes less dyspnea w/ walking, on hills, etc...  She saw DrPyrtle 6/15- Hx GERD w/ esoph, IBS, adenomatous colon polyps; on Nexium40/d and Carafate 1tmQhs and doing well w/o breakthrough symptoms...  We reviewed the following medical problems during today's office visit >>     Sarcoid> Hx abn CXR, she is asymptomatic; baseline CXR w/ right suprahilar nodule, adenop, scarring & chr changes; ?LLLpneum 3/15 in [place] treated w/ Levaquin; last CXR 1/15 was stable and last ACE=55 .Marland KitchenMarland Kitchen  MVP> on ASA81;  She denies CP, palpit, SOB, etc...     Chol> on Simva40 & FishOil; last FLP 11/15 showed TChol 169, TG 135, HDL 41, LDL 101; reviewed diet & wt reduction...    GI> GERD, IBS, Polyp> on Nexium40, Carafate1GmQhs, & Zofran prn; she remains asymptomatic w/o abd pain, n/v, c/d, blood seen...    UTIs> she reports no prob on Cranberry juice...    DJD, LBP, L1 compression w/ kyphoplasty, Osteop> on calium, MVI, VitD & OTC analgesics; she fell 2/14 w/ L1 compression & kyphoplasty in [place]; she is ready for f/u BMD=> pending...    Anxiety> she has a lot of family support, doesn't require meds... We reviewed prob list, meds, xrays and labs> see below for updates >> she had the 2015 Flu shot recently, she is in need of the Prevnar-13 vaccine but wants to wait...   LABS 11/15:  FLP- looks good on Simva40, needs to lose wt;  Chems- wnl;  CBC- wnl;  TSH=2.19;  VitD=52...  ~  Dec 06, 2014:  50mo Laura [name] had left elbow surg for a nodule & excision by [name] showed path=numerous nonnecrotizing granuloma, spec stains neg & this is c/w an unusual manifestation of her Sarcoid; she was told "scar tissue" and is improved now... We reviewed the following medical problems during today's office visit >>     Sarcoid> Hx abn CXR, she is asymptomatic; baseline CXR w/ right suprahilar nodule, adenop, scarring & chr changes; ?LLLpneum D34-534 in [place] treated w/ Levaquin; last CXR 1/15 was  stable and last ACE=55; she had nodule excised from left elbow 4/16 by DrKuzma=> granulomatous nodule c/w unusual manifewstation of Sarcoid, all spec stains were neg...    HxMVP, palpit w/ PVCs & PACs on Holter> on ASA81;  She denies CP, palpit, SOB now; advised no caffeine & avoid stress!    Chol> on Simva40 & FishOil; last FLP 11/15 showed TChol 169, TG 135, HDL 41, LDL 101; reviewed diet & wt reduction...    GI> GERD, IBS, Polyp> on Prilosec40Bid, Carafate1GmQhs, & Zofran prn; followed by DrJacobs- she remains asymptomatic w/o abd pain, n/v, c/d, blood seen...    UTIs> she reports no prob on Cranberry juice but had one recurrent UTI 4/16- Cipro cqalled in & symptoms resolved....    DJD, LBP, L1 compression w/ kyphoplasty, Osteop> on calcium, MVI, VitD & Tramadol50; she fell 2/14 w/ L1 compression & kyphoplasty in [place]; BMD 2/16 w/ Tscore-1.8 & we rec ALENDRONATE70/wk...    Anxiety> she has a lot of family support, doesn't require meds... We reviewed prob list, meds, xrays and labs> see below for updates >>   CXR 5/16 showed stable BHA & scarring on right, NAD, no change...  LABS 5/16:  ACE=56, stable...  ~  June 08, 2015:  35mo Laura [name] had f/u DrPyrtle recently- GERD, esoph spasm, IBS, colon polyps & +FamHx colon ca in sister> Rx w/ Nexium20Bid, Carafate prn, Cardizem30 prn esoph spasm and sheis improved ;  She also had f/u Cards DrTaylor 12/23/14- palpit w/ PVCs and PACs, improved and rec to f/u prn... Note that she is off prev Fosamax Rx- took it for 87mo & stopped due to reflux- offered Binoso but she prefers off Rx (BMD 2/16- Tscore -1.8) & she takes Ca & VitD supplement... Prob list as above> EXAM shows Afeb, VSS, O2sat=95% on RA;  HEENT- neg, Mallampati1;  Chest- clear w/o w/r/r;  Heart- RR w/o m/r/g;  Abd- soft, nontender, neg;  Ext-  neg w/o c/c/e;  Neuro- intact w/o focal abn...  LABS 06/07/16>  FLP- all parameters at goals on Simva40;  Chems- wnl;  CBC- wnl;  TSH=1.86;  VitD=52;   ACE=63;   IMP/PLAN>>  Laura [name] is stable on current regimen- continue same meds and f/u in 48mo...          Problem List:  Hx of UVEITIS (ICD-364.3) - eval at [facility] since 2001, given steroid injection in 2002... s/p right cataract surg 2/09 and no active uveitis seen... ~  4/11:  she tells me she was seen 10/10 & doing well, may need left cataract surg soon.  R/O SARCOIDOSIS (ICD-135) - Hx uveitis w/ eval at [facility], and subseq f/u CXR w/ CTChest showing bilat hilar adenopathy, and w/u showing neg PPD, ACE=53, sed=13... prev PFT's 1/08 showed FVC 3.75 (111%), FEV1 2.74 (102%), %1sec=73, mid-flows=70%... being followed w/ serial films and "watchful waiting"... there is a family hx of sarcoidosis in one brother (severe dis w/ neuropathy). ~   we had a f/u CXR in Feb09- streaky opacity right mid lung zone, similar to 2008...  ~  CT Chest 7/08 & 6/09 showed mediastinal > hilar adenopathy some w/ calcif (generally smaller than 1/08), area of atx/ scarring right mid zone (infer RUL) & scat <1cm nodules in lower zones w/o change... ~  CXR 2/10 showed bilat hilar prominence & scarring appears the same- NAD... labs all WNL w/ ACE=56. ~  CXR 4/11 showed mild bilat hilar prominence, scarring, NAD- osteopenia & DJD in TSpine... ACE= 37. ~  CXR 4/12 showed stable bilat hilar prominence, scarring, NAD.[name]. ~  CXR 11/13 showed similar changes- irreg nodular changes in right suprahilar region, scarring, stable adenopathy, mild atherosclerotic calcif, osteopenia... ~  CXR 3/14 showed normal heart size, atherosclerotic Ao, chr scarring from underlying sarcoid, NAD.[name]. ~  CXR 1/15 showed norm heart size, areas of scarring appear unchanged, prom hilae w/o change- stable, NAD ~  PFTs 5/15 showed just some sm airways dis> FVC=2.65 (78%), FEV1=1.86 (72%); %1sec=70; mid-flows=55% predicted... After bronchodil her FEV1 improved 6%... Lung Volumes & DLCO are wnl. ~  LABS 5/15:  ACE level = 55  ~  4/16: she had nodule excised from  left elbow 4/16 by DrKuzma=> granulomatous nodule c/w unusual manifewstation of Sarcoid, all spec stains were neg... ~  5/16: CXR 5/16 showed stable BHA & scarring on right, NAD, no change... LABS 5/16:  ACE=56, stable.  PALPITATIONS >>  Hx MITRAL VALVE PROLAPSE >> on ASA 81mg /d... clinical dx w/ 2DEcho 1999 showing flat closure but no frank prolapse... ~  2/16: she had a cardiac eval by DrTaylor for dyspnea> 2DEcho was neg, wnl; stress testing was neg- w/o ischemia; noted to have poor functional capacity & is way too sedentary; she had some palpit & monitor revealed PVCs & PACs- didn't want meds therefore asked to avoid caffeine 7 stress...  HYPERCHOLESTEROLEMIA (ICD-272.0) - on SIMVASTATIN 40mg /d now... ~  [place] 1/08 on Lip10 showed TChol 183, TG 122, HDL 45, LDL 114...  ~  [place] 2/09 on Lip10 showed TChol 183, TG 189, HDL 39, LDL 107... continue diet & change to Simva40.  ~  FLP 5/09 on Simva40 showed TChol 185, TG 163, HDL 36, LDL 117... she wants to stay on Simva40. ~  FLP 2/10 showed TChol 176, Tg 160, HDL 39, LDL 105... rec> continue same. ~  FLP 4/11 showed TChol 160, TG 131, HDL 41, LDL 93 ~  FLP 4/12 showed TChol 174, TG 148, HDL 43, LDL  102 ~  FLP 5/13 showed TChol 159, TG 151, HDL 47, LDL 82 ~  FLP 5/14 on Simva40 showed TChol 165, TG 157, HDL 41, LDL 93 ~  FLP 11/15 on Simva40 showed TChol 169, TG 135, HDL 41, LDL 101  GERD (ICD-530.81) - on NEXIUM 40mg /d & ZANTAC 300mg Qhs... ~  EGD 6/07 w/ 3cmHH, stricture, gastitis (HPylori neg)... I offered to change her Nexium to a generic but she declined- "DrPatterson told me never to change this med"... she notes occas nocturnal reflux symptoms. ~  5/12: She had GI eval by DrPatterson 5/12> chr GERD, hx 3cmHH, prev stricture dilated, hx colon polyps, etc> c/o incr reflux symptoms, bloating, pressure despite her Nexium40AM & Zantac300PM;  Nexium was increased to Bid, Carafate added Qid & she had EGD 5/12 showing mod gastritis w/ bx= chr inflamm,  neg HPylori... ~  CTAbd 5/12 showed mult parenchymal nodules at lung bases (some fractionally larger), calcif hilar nodes bilat, no renal lesions (?left upper pole mass seen on sonar?). ~  EGD 3/13 by DrPatterson showed 3cmHH, esophagitis, prob "pill espohagitis", dilated... Symptoms resolved w/ Carafate, Nexium, Zantac, Xylocaine... ~  5/14: on Nexium40Bid, Zantac300HS, CarafateQid prn, & Zofran prn; she remains asymptomatic w/o abd pain, n/v, c/d, blood seen... ~  11/15: on Nexium40, Carafate1GmQhs, & Zofran prn; she has seen DrPyrtle & remains asymptomatic on these meds- w/o abd pain, n/v, c/d, blood seen...  IRRITABLE BOWEL SYNDROME (ICD-564.1) COLONIC POLYPS, HX OF (ICD-V12.72)  ~  Colonoscopy 6/07 by DrPatterson showing divertics & polyps (adenomatous)... f/u planned 31yrs... ~  She had f/u colonoscopy 5/12 showing mod divertics, sessile polyp in cecum= serrated adenoma w/ f/u planned 3-34yrs...  UTI'S, CHRONIC (ICD-599.0) - eval by DrOttelin for urology... now off the Macrodantin and using cranberry juice without recurrent UTI, no problems...  DEGENERATIVE JOINT DISEASE (ICD-715.90) - eval by DrDalldorf w/ mod DJD in knees & hx of torn left meniscus... 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 overall improved now. ~  11/13:  TRAMADOL 50mg  Tid refilled for prn use... ~  2015-16: she's been eval by [facility]> right & left knee DJD, right ankle tendonitis, right plantar fasciitis...  ~  4/16: she had right elbow surg by DrKuzma> mult granulomas on path c/w sarcoid nodule...  LOW BACK PAIN, CHRONIC (ICD-724.2) ~  2/14: she fell while getting up at night & hit her back w/ severe pain & L1 compression fx- adm to [facility] for 2d w/ kyphoplasty & good pain relief...  OSTEOPENIA (ICD-733.90) - on Calcium, MVI, Vit D... ~  BMD in 2005 showed TScore -1.2 in Spine, & -0.7 in [facility]. ~   BMD 5/09 showed TScores -1.5 in Spine & -1.1 in FemNecks. ~  BMD here 5/11 showed TScores -1.2 in Spine, and -1.2 in [facility]. ~  5/13 & 5/14:  She is due for f/u BMD but wants to wait for now... ~  Labs 11/15 showed VitD= 52... ~  BMD 2/16 revealed Tscore -1.8 in Lspine and Rt FemNeck; we rec FOSAMAX70/wk in light of her prev compression fx...  ANXIETY (ICD-300.00)  Hx of BASAL CELL SKIN CANCER - she still sees her Derm in [place] yearly...  Health Maintenance: ~  GI:  followed by DrPatterson/Pyrtle w/ colon as above... ~  GYN:  followed by DrRichardson & doing well by pt's report... Mammograms at the [facility]... ~  Immunizations:  she gets yearly  flu shots;  has PNEUMOVAX in 1998 & repeated 10/11 (age 69);  TDAP given 5/13;  we discussed Shingles vaccine (she had bout of shingles involving her right hip & leg ~age30) & she will decide.    Past Surgical History  Procedure Laterality Date  . Vesicovaginal fistula closure w/ tah    . Basal cell carcinoma excision    . Cataract extraction      right  . Removal of mass from rt antecubital fossa    . Abdominal hysterectomy    . Upper gastrointestinal endoscopy    . Colonoscopy  2012  . Mass excision Left 11/02/2014    Procedure: EXCISION MASS LEFT ELBOW;  Surgeon: Daryll Brod, MD;  Location: [place];  Service: Orthopedics;  Laterality: Left;    Outpatient Encounter Prescriptions as of 06/08/2015  Medication Sig  . aspirin 81 MG tablet Take 81 mg by mouth daily.    . B Complex Vitamins (VITAMIN-B COMPLEX PO) Take 1 capsule by mouth daily.   . Calcium Carbonate (CALTRATE 600 PO) Take 1 tablet by mouth 2 (two) times daily.    [name] diltiazem (CARDIZEM) 30 MG tablet Take 1 tablet (30 mg total) by mouth every 6 (six) hours. For esophageal spasm  . Esomeprazole Magnesium (NEXIUM 24HR) 20 MG TBEC Take 1 tablet by mouth 2 (two) times daily.  . Glucosamine HCl (GLUCOSAMINE PO) Liquid form - Take by mouth as directed once  daily  . meclizine (ANTIVERT) 25 MG tablet Take 1/2-1 tablet by mouth every 4 hours as needed for dizziness  . Multiple Vitamins-Minerals (MULTIVITAL) tablet Take 1 tablet by mouth daily.    . Omega-3 Fatty Acids (FISH OIL) 1000 MG CAPS Take 1 capsule by mouth daily.    . ondansetron (ZOFRAN) 8 MG tablet 1 tablet by mouth every 6 hours as needed for nausea  . sucralfate (CARAFATE) 1 G tablet Take 1 tablet (1 g total) by mouth 2 (two) times daily.  . [DISCONTINUED] Cholecalciferol (VITAMIN D) 1000 UNITS capsule Take 1,000 Units by mouth daily. Reported on 10/18/2015  . [DISCONTINUED] simvastatin (ZOCOR) 40 MG tablet TAKE 1 TABLET BY MOUTH EVERY NIGHT AT BEDTIME  . alendronate (FOSAMAX) 70 MG tablet Take 1 tablet (70 mg total) by mouth once a week. Take with a full glass of water on an empty stomach.  . Alendronate Sodium (BINOSTO) 70 MG TBEF Take 1 tablet by mouth once a week.   No facility-administered encounter medications on file as of 06/08/2015.    Allergies  Allergen Reactions  . Prednisone     REACTION: unable to take this med due to an eye condition  . Amoxicillin-Pot Clavulanate     REACTION: causes diarrhea  . Codeine     REACTION: nausea  . Levaquin [Levofloxacin In D5w]     Had itching w/ intravenous, not sure if reaction was from abx or the need to change the IV.  Does well when taken with Benadryl.  . Tdap [Diphth-Acell Pertussis-Tetanus]     Area raised, warm to touch, tender, swollen, pt felt jittery, nausea and some SOB  . Tape Rash    NEEDS PAPER TAPE    Review of Systems         See HPI - all other systems neg except as noted... The patient denies anorexia, fever, weight loss, weight gain, vision loss, decreased hearing, hoarseness, chest pain, syncope, dyspnea on exertion, peripheral edema, prolonged cough, headaches, hemoptysis, abdominal pain, melena, hematochezia, severe indigestion/heartburn, hematuria, incontinence,  muscle weakness, suspicious skin lesions,  transient blindness, difficulty walking, depression, unusual weight change, abnormal bleeding, enlarged lymph nodes, and angioedema.     Objective:   Physical Exam    WD, WN, 74 y/o WF in NAD... GENERAL:  Alert & oriented; pleasant & cooperative... HEENT:  [place]/AT, EOM-wnl, PERRLA, EACs-clear, TMs-wnl, NOSE-clear, THROAT-clear & wnl. NECK:  Supple w/ fairROM; no JVD; normal carotid impulses w/o bruits; no thyromegaly or nodules palpated; no lymphadenopathy. CHEST:  Clear to P & A; without wheezes/ rales/ or rhonchi. HEART:  Regular Rhythm; without murmurs/ rubs/ or gallops. ABDOMEN:  Soft & nontender; normal bowel sounds; no organomegaly or masses detected. EXT: without deformities, mild arthritic changes; no varicose veins/ +venous insuffic/ no edema. NEURO:  CN's intact;  no focal neuro deficits... DERM:  No lesions noted; no rash etc...  RADIOLOGY DATA:  Reviewed in the EPIC EMR & discussed w/ the patient...  LABORATORY DATA:  Reviewed in the EPIC EMR & discussed w/ the patient...   Assessment & Plan:    DYSPNEA w/ thorough PULM & CARDIAC eval>> underlying sarcoidosis w/ mild pulm fibrosis, no signs of dis activity, remains on watchful waiting protocol; Cards eval by DrTaylor was neg as well x PVCs & PACs; she has improved on a gradual exercise program, avoids caffeine & stress...  SARCOID>  CXR/ PFT/ ACE stable & she remains asymptomatic, no sigs of dis activity x nodule left elbow excised & path revealed nonnecrotizing granulomata...  CHOL>  Stable on the Simva40 + diet...  GERD>  On Prilosec & Zantac, she is currently improved from her prev symptoms...  DJD/ LBP/ Osteopenia>  As noted she remains stable on current meds & exercise program; she will see DrDaldorf about her ankle pain; BMD 2/16 w/ Tscore -1.8 & rec to start [place]...  Compression fx L1 after fall> she is s/p L1 kyphoplasty...  Other medical issues as noted...   Patient's Medications  New Prescriptions    ALENDRONATE SODIUM (BINOSTO) 70 MG TBEF    Take 1 tablet by mouth once a week.   BENZONATATE (TESSALON) 200 MG CAPSULE    Take 1 capsule (200 mg total) by mouth 3 (three) times daily as needed for cough.   CIPROFLOXACIN (CIPRO) 250 MG TABLET    Take 1 tablet (250 mg total) by mouth 2 (two) times daily.  Previous Medications   ALENDRONATE (FOSAMAX) 70 MG TABLET    Take 1 tablet (70 mg total) by mouth once a week. Take with a full glass of water on an empty stomach.   ASPIRIN 81 MG TABLET    Take 81 mg by mouth daily.     B COMPLEX VITAMINS (VITAMIN-B COMPLEX PO)    Take 1 capsule by mouth daily.    CALCIUM CARBONATE (CALTRATE 600 PO)    Take 1 tablet by mouth 2 (two) times daily.     DILTIAZEM (CARDIZEM) 30 MG TABLET    Take 1 tablet (30 mg total) by mouth every 6 (six) hours. For esophageal spasm   ESOMEPRAZOLE MAGNESIUM (NEXIUM 24HR) 20 MG TBEC    Take 1 tablet by mouth 2 (two) times daily.   GLUCOSAMINE HCL (GLUCOSAMINE PO)    Liquid form - Take by mouth as directed once daily   MECLIZINE (ANTIVERT) 25 MG TABLET    Take 1/2-1 tablet by mouth every 4 hours as needed for dizziness   MULTIPLE VITAMINS-MINERALS (MULTIVITAL) TABLET    Take 1 tablet by mouth daily.     OMEGA-3 FATTY  ACIDS (FISH OIL) 1000 MG CAPS    Take 1 capsule by mouth daily.     ONDANSETRON (ZOFRAN) 8 MG TABLET    1 tablet by mouth every 6 hours as needed for nausea   SUCRALFATE (CARAFATE) 1 G TABLET    Take 1 tablet (1 g total) by mouth 2 (two) times daily.  Modified Medications   Modified Medication Previous Medication   SIMVASTATIN (ZOCOR) 40 MG TABLET simvastatin (ZOCOR) 40 MG tablet      TAKE 1 TABLET BY MOUTH EVERY NIGHT AT BEDTIME    TAKE 1 TABLET BY MOUTH EVERY NIGHT AT BEDTIME  Discontinued Medications   CHOLECALCIFEROL (VITAMIN D) 1000 UNITS CAPSULE    Take 1,000 Units by mouth daily. Reported on 10/18/2015   SIMVASTATIN (ZOCOR) 40 MG TABLET    TAKE 1 TABLET BY MOUTH EVERY NIGHT AT BEDTIME

## 2015-12-26 NOTE — Progress Notes (Addendum)
Subjective:    Patient ID: Laura [name], female    DOB: 1941/03/08, 75 y.o.   MRN: [id number]  HPI 75 y/o WF here for a follow up visit... she has prob Sarcoidosis w/ hx Uveitis and BHA on CXR- we are following a "watchful waiting" protocol...  ~  Nov 21, 2011:  37moROV & LGavrielais doing reasonably well w/o new complaints or concerns;     [facility] an [facility] [place] [facility] she developed an "irritation in my esoph" dyspagia/ odynophagia; EGD by DrPatterson revealed "Pill esophagitis" & she was treated w/ Carafate (off now), Nexium Bid, & Zantac 300Qhs;  improved now on & she will check w/ GI about a formulary PPI med...    We reviewed prob list, meds, xrays, & labs> see below>>   LABS 5/13:  FLP- at goals on Simva40;  Chems- wnl;  CBC- wnl;  TSH=1.61;  VitD=47  ADDENDUM>> she received TDAP w/ local reaction req antihist & topical Rx...  ~  May 23, 2012:  [name] & Laura [name] reports some vertigo while on vacation recently & improved w/ Antivert...    Sarcoid> Hx abn CXR, she is asymptomatic; CXR today= right suprahilar nodule (likely sarcoid scarring) & chr changes...    MVP> on ASA81;  She denies CP, palpit, SOB, etc...     Chol> on Simva40; FLP 5/13 looked ok w/ TChol 159, TG 151, HDL 47, LDL 82; reviewed diet & wt reduction...    GI> GERD, IBS, Polyp> on Nexium40, Zantac300HS, CarafateQid prn, & Zofran prn;     UTIs> she reports no prob on Cranberry juice...    DJD, LBP, Osteop> on calium, MVI, VitD; we refilled Tramadol for prn use...    Anxiety> she has a lot of family support, doesn't require meds... We reviewed prob list, meds, xrays and labs> see below for updates >> OK Flu shot today...  CXR 11/13 showed similar changes- irreg nodular changes in right suprahilar region, scarring, stable adenopathy, mild atherosclerotic calcif, osteopenia...   ~  Dec 02, 2012:  [name] & [name] that in Feb2014 she fell while getting up at night & hit her  back w/ severe pain & L1 compression fx- [facility] & good pain relief; also told to have LLLpneumonia treated w/ Levaquin; she had f/u here by TP 3/14 & improved; since then she is back to normal- had nice [place] vacation, feeling well, etc...  We reviewed the following medical problems during today's office visit >>     Sarcoid> Hx abn CXR, she is asymptomatic; baseline CXR w/ right suprahilar nodule, adenop, scarring & chr changes; ?[place]pneum [place]/ [place]...    MVP> on ASA81;  She denies CP, palpit, SOB, etc...     Chol> on Simva40; FLP 5/14 showed TChol 165, TG 157, HDL 41, LDL 93; reviewed diet & wt reduction...    GI> GERD, IBS, Polyp> on Nexium40Bid, Zantac300HS, CarafateQid prn, & Zofran prn; she remains asymptomatic w/o abd pain, n/v, c/d, blood seen...    UTIs> she reports no prob on Cranberry juice...    DJD, LBP, L1 compression w/ kyphoplasty, Osteop> on calium, MVI, VitD; we refilled Tramadol for prn use; [place] 2[place]14 [place].    Anxiety> she has a lot of family support, doesn't require meds... We reviewed prob list, meds, xrays and labs>  see below for updates >>   CXR 3/14 showed normal heart size, atherosclerotic Ao, chr scarring from underlying sarcoid, NAD...  LABS 5/14:  FLP- at goals on Simva40;  Chems- wnl;  CBC- wnl;  TSH=1.74;  VitD=52...  ~  June 03, 2013:  [name] & Laura [name] has had some prob w/ pain & swelling in right ankle- she has appt w/ Ortho this afternoon (Laura [name])...     Hx prob Sarcoid and abn CXR; she remains asymptomatic & last film 3/14 showed stable scarring, NAD...     She remains on Simva40 for Chol; Labs 5/14 looked ok & we reviewed diet, exercise, & wt reduction strategies...    She has Hx GERD, IBS, colon polyps> on Nexium40Qam, Zantac300Qhs, Carafate 1gmQidPrn & doing fine w/o abd apin, dysphagia, n/v, c/d, blood seen; last colon was  5/12...    She has hx DJD, LBP, L1 compression w/ kyphoplasty; on Tramadol prn, takes calcium, MVI, VitD; last BMD was done here 5/11 w/ TScores -1.2 in Spine and FemNecks... We reviewed prob list, meds, xrays and labs> see below for updates >> she had the 2014 flu vaccine in Oct...   ~  Dec 09, 2013:  614moOV & Laura [name] saw TP several months ago c/o dyspnea and fatigue> she's been sedentary due to foot problem & when she tried to walk she noted trouble w/ hills, no CP, etc; hx sarcoid but she has never required treatment as it has been felt to be inactive- f/u CXR unchanged; felt to likely be deconditioning but referred to Cards for their eval as well...    Seen by Laura [name] for CaTribune CompanyDEcho showed norm LVF, norm valves, no signs of pulmHTN; subdeq stress testing showed extreme dyspnea w/ exercise (poor functional capacity), no ischemia; she has been encouraged to join silver sneakers and incr her exercise program...     Seen by Laura [name] for GI> c/o epig discomfort, hx GERD/ IBS/ colon polyps; symptoms improved by incr Nexium to Bid & adding Carafate prn; she is up to date on screening...  We reviewed prob list, meds, xrays and labs> see below for updates >>   LABS 1/15:  Chems- ok x K=3.4;  CBC- wnl;  TSH=2.28;  Mag=2.0...Marland KitchenMarland KitchenCXR 1/15 showed norm heart size, areas of scarring appear unchanged, prom hilae w/o change- stable, NAD...Marland KitchenMarland Kitchen EKG 3/15 showed NSR, rate79, wnl, NAD...  Stress Test 3/15 showed poor exercise capacity, no ischemia...  2DEcho 3/15 showed normal LVF w/ EF=55-60%, no regional wall motion abn, normal valves, no evid for pulmHTN...  PFTs 5/15 showed just some sm airways dis> FVC=2.65 (78%), FEV1=1.86 (72%); %1sec=70; mid-flows=55% predicted... After bronchodil her FEV1 improved 6%... Lung Volumes & DLCO are wnl...   LABS 5/15:  ACE level = 55     ~  June 07, 2014:  39m239moV & Laura [name] is improved> she has been exercising at home 2-3d/wk & [facility] 2d[facility]; she notes less dyspnea w/  walking, on hills, etc...  She saw Laura [name] 6/15- Hx GERD w/ esoph, IBS, adenomatous colon polyps; on Nexium40/d and Carafate 1tmQhs and doing well w/o breakthrough symptoms...  We reviewed the following medical problems during today's office visit >>     Sarcoid> Hx abn CXR, she is asymptomatic; baseline CXR w/ right suprahilar nodule, adenop, scarring & chr changes; ?LLLpneum 09[place]05/72 [place]/ [place]; last CXR 1/15 was stable and last ACE=55 ...    MVP> on ASA81;  She denies CP, palpit, SOB, etc...[name]  Chol> on Simva40 & FishOil; last FLP 11/15 showed TChol 169, TG 135, HDL 41, LDL 101; reviewed diet & wt reduction...    GI> GERD, IBS, Polyp> on Nexium40, Carafate1GmQhs, & Zofran prn; she remains asymptomatic w/o abd pain, n/v, c/d, blood seen...    UTIs> she reports no prob on Cranberry juice...    DJD, LBP, L1 compression w/ kyphoplasty, Osteop> on calium, MVI, VitD & OTC analgesics; she fell 2/14 w/ L1 compression & kyphoplasty in [place]; she is ready for f/u BMD=> pending...    Anxiety> she has a lot of family support, doesn't require meds... We reviewed prob list, meds, xrays and labs> see below for updates >> she had the 2015 Flu shot recently, she is in need of the Prevnar-13 vaccine but wants to wait...   LABS 11/15:  FLP- looks good on Simva40, needs to lose wt;  Chems- wnl;  CBC- wnl;  TSH=2.19;  VitD=52...  ~  Dec 06, 2014:  1moRKaltaghad left elbow surg for a nodule & excision by DMelrose Nakayamashowed path=numerous nonnecrotizing granuloma, spec stains neg & this is c/w an unusual manifestation of her Sarcoid; she was told "scar tissue" and is improved now... We reviewed the following medical problems during today's office visit >>     Sarcoid> Hx abn CXR, she is asymptomatic; baseline CXR w/ right suprahilar nodule, adenop, scarring & chr changes; ?[place] 25/[place]; last CXR 1/15 was stable and last ACE=55; she had nodule excised from left elbow  4/16 by DrKuzma=> granulomatous nodule c/w unusual manifewstation of Sarcoid, all spec stains were neg...    HxMVP, palpit w/ PVCs & PACs on Holter> on ASA81;  She denies CP, palpit, SOB now; advised no caffeine & avoid stress!    Chol> on Simva40 & FishOil; last FLP 11/15 showed TChol 169, TG 135, HDL 41, LDL 101; reviewed diet & wt reduction...    GI> GERD, IBS, Polyp> on Prilosec40Bid, Carafate1GmQhs, & Zofran prn; followed by DrJacobs- she remains asymptomatic w/o abd pain, n/v, c/d, blood seen...    UTIs> she reports no prob on Cranberry juice but had one recurrent UTI 4/16- Cipro cqalled in & symptoms resolved....    DJD, LBP, L1 compression w/ kyphoplasty, Osteop> on calcium, MVI, VitD & Tramadol50; [place] 2[place]14 [place] w/ Tscore-1.8 & we rec ALENDRONATE70/wk...    Anxiety> she has a lot of family support, doesn't require meds... We reviewed prob list, meds, xrays and labs> see below for updates >>   CXR 5/16 showed stable BHA & scarring on right, NAD, no change...  LABS 5/16:  ACE=56, stable...  ~  June 08, 2015:  697moOSte. Genevievead f/u Laura [name] recently- GERD, esoph spasm, IBS, colon polyps & +FamHx colon ca in sister> Rx w/ Nexium20Bid, Carafate prn, Cardizem30 prn esoph spasm and sheis improved ;  She also had f/u Cards Laura [name] 12/23/14- palpit w/ PVCs and PACs, improved and rec to f/u prn... Note that she is off prev Fosamax Rx- took it for 70m2mostopped due to reflux- offered Binoso but she prefers off Rx (BMD 2/16- Tscore -1.8) & she takes Ca & VitD supplement... Prob list as above> EXAM shows Afeb, VSS, O2sat=95% on RA;  HEENT- neg, Mallampati1;  Chest- clear w/o w/r/r;  Heart- RR w/o m/r/g;  Abd- soft, nontender, neg;  Ext- neg w/o c/c/e;  Neuro- intact w/o focal abn...  LABS 06/07/16>  FLP-  all parameters at goals on Simva40;  Chems- wnl;  CBC- wnl;  TSH=1.86;  VitD=52;  ACE=63;   IMP/PLAN>>  Laura [name] is stable on current regimen-  continue same meds and f/u in 53mo...  ~  December 26, 2015:  6-57mo Laura [name] remains asymptomatic, feeling well, and denies CP/ palpit. Cough/ phlegm/ hemoptysis, SOB, edema, etc;  She tells me that she got the Flu at the end of March noting HA, sore throat, severe cough w/o much sput, low grade temp, aching & sore, etc & was seen in the ER where CXR showed bilat hilar adenopathy & increased curvilinear thickening on the right but no acute process;  Flu panel was POS for TypeA flu;  She had f/u OV w/ TP shortly thereafter & given ZPak, Tessalon, Mucinex, Delsym, fluids;  She notes that it took her 8wks to fully resolve & get her energy back!  We reviewed the following medical problems during today's office visit >>     Ophthalmology> Hx uveitis, s/p bilat cat surg, followed at [facility]...    Sarcoid> Hx uveitis & abn CXR w/ BHA, she is asymptomatic; baseline CXR w/ adenop, scarring & chr changes; hx ?LLLpneum [id number] in [place] treated w/ Levaquin; last CXR 5/16 was stable and last ACE=56; she had nodule excised from left elbow 4/16 by DrKuzma=> granulomatous nodule c/w unusual manifestation of Sarcoid, all spec stains were neg...    HxMVP, palpit w/ PVCs & PACs on Holter> on ASA81;  She denies CP, palpit, SOB now; advised no caffeine & avoid stress!    Chol> on Simva40 & FishOil; last FLP 11/15 showed TChol 169, TG 135, HDL 41, LDL 101; reviewed diet & wt reduction...    GI> GERD, IBS, Polyp> on Prilosec40Bid, Carafate1GmQhs, & Zofran prn; followed by DrJacobs- she remains asymptomatic w/o abd pain, n/v, c/d, blood seen...    UTIs> she reports no prob on Cranberry juice but had one recurrent UTI 4/16- Cipro cqalled in & symptoms resolved....    DJD, LBP, L1 compression w/ kyphoplasty, Osteop> on calcium, MVI, VitD & Tramadol50; she fell 2/14 w/ L1 compression & kyphoplasty in [place]; BMD 2/16 w/ Tscore-1.8 & we rec ALENDRONATE70/wk...    Anxiety> she has a lot of family support, doesn't require meds... EXAM  shows Afeb, VSS, O2sat=97% on RA;  HEENT- neg, Mallampati1;  Chest- clear w/o w/r/r;  Heart- RR w/o m/r/g;  Abd- soft, nontender, neg;  Ext- neg w/o c/c/e;  Neuro- intact w/o focal abn...  CXR 10/16/15> I have reviewed this film & compared to prev images>  bilat hilar adenopathy & increased opac in left superior hilum and right lat hilar region w/ extension into ant RUL (similar to 11/2014 films but progressed from previous CXRs) => we will proceed w/ f/u CT Chest...  LABS 10/16/15>  Flu panel showed POS FluA;  Neg FluB and H1N1...   LABS 12/2015>  ACE=62 (nl=8-52);  VitD=51;  BMet- ok x BS=131, Cr=0.74, Ca=9.4.Marland KitchenMarland Kitchen  CT Chest w/ contrast 01/06/16>  Norm heart size, aortic 7 coronary atherosclerosispart calcif mediastinal adenopathy (unchanged from 2009), progression of bilat hilar adenopathy & pulm parenchymal changes w/ mass-like density in RML~4cm, and mass-like density in ant LUL ~4x2cm -- radiology feels this is c/w sarcoid progression.   Full PFTs 01/06/16>  FVC=2.66 (80%), FEV1=1.82 (72%), %1sec=68, mid-flows rediced at 53% predicted; post bronchodil FEV1 improved 5% to 1.92;  TLC=5.57 (98%), RV=2.72 (108%), RV/TLC=49%;  DLCO=70% pred (DL/VA=96%). IMP/PLAN>>  Concern for active sarcoidosis w/ the recent CXR and  CT changes; she has never had a biopsy due to remote presentation w/ BHA and uveitis and the fact that she has been devoid of resp symptoms, and prev had neg ACE levels (ie- no signs of dis activity);  Her recent CXR changes (progression), sl elev ACE in the low 60s, suggests some activity yet she remains stoic & asymptomatic;  She would like to proceed w/o lung Bx if poss & I discussed trial PREDNISONE w/ short term f/u to check ACE & CXR (if not improved then we will address need for lung bx at that time);  REC to start Pred40/d x2wks, 30/d x2wks, then 20/d til return in about 6wks time...           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 s/p right cataract surg 2/09 and no active uveitis seen... ~  4/11:  she tells me she was seen 10/10 & doing well, may need left cataract surg soon. ~  Ophthalmology notes from [facility] are reviewed:  DrDickinson noted hx uveitis- steroid responder, s/p bilat cat surg w/ lens replacement, epiretinal membrane & post vitreous detachments...   R/O SARCOIDOSIS (ICD-135) - Hx uveitis w/ eval at [facility], and subseq f/u CXR w/ CTChest showing bilat hilar adenopathy, and w/u showing neg PPD, ACE=53, sed=13... prev PFT's 1/08 showed FVC 3.75 (111%), FEV1 2.74 (102%), %1sec=73, mid-flows=70%... being followed w/ serial films and "watchful waiting"... there is a family hx of sarcoidosis in one brother (severe dis w/ neuropathy). ~   we had a f/u CXR in Feb09- streaky opacity right mid lung zone, similar to 2008...  ~  CT Chest 7/08 & 6/09 showed mediastinal > hilar adenopathy some w/ calcif (generally smaller than 1/08), area of atx/ scarring right mid zone (infer RUL) & scat <1cm nodules in lower zones w/o change... ~  CXR 2/10 showed bilat hilar prominence & scarring appears the same- NAD... labs all WNL w/ ACE=56. ~  CXR 4/11 showed mild bilat hilar prominence, scarring, NAD- osteopenia & DJD in TSpine... ACE= 37. ~  CXR 4/12 showed stable bilat hilar prominence, scarring, NAD.[name]. ~  CXR 11/13 showed similar changes- irreg nodular changes in right suprahilar region, scarring, stable adenopathy, mild atherosclerotic calcif, osteopenia... ~  CXR 3/14 showed normal heart size, atherosclerotic Ao, chr scarring from underlying sarcoid, NAD.[name]. ~  CXR 1/15 showed norm heart size, areas of scarring appear unchanged, prom hilae w/o change- stable, NAD ~  PFTs 5/15 showed just some sm airways dis> FVC=2.65 (78%), FEV1=1.86 (72%); %1sec=70; mid-flows=55% predicted... After bronchodil her FEV1 improved 6%... Lung Volumes & DLCO are wnl. ~  LABS 5/15:  ACE level = 55  ~  4/16: she had nodule excised from left elbow 4/16  by DrKuzma=> granulomatous nodule c/w unusual manifewstation of Sarcoid, all spec stains were neg... ~  5/16: CXR 5/16 showed stable BHA & scarring on right, NAD, no change... LABS 5/16:  ACE=56, stable. ~  2017>  +FluA illness 09/2015 w/ ER eval & CXR showing progressive abn;  CT Chest 12/2015 confirmed progression of BHA & LUL/RML parenchymal densities felt to be c/w active Sarcoid, ACE=62;  Pt wanted to avoid Bx if poss & we decided on a course of Pred 40-30-20 Q2wk taper w/ ROV in 6wks for f/u CXR, ACE...   PALPITATIONS >>  Hx MITRAL VALVE PROLAPSE >> on ASA 81mg /d... clinical dx w/ 2DEcho 1999 showing flat closure but no frank prolapse... ~  2/16: she had a cardiac eval by Laura [name] for dyspnea> 2DEcho was neg, wnl; stress testing was neg- w/o ischemia; noted to have poor functional capacity & is way too sedentary; she had some palpit & monitor revealed PVCs & PACs- didn't want meds therefore asked to avoid caffeine 7 stress...  HYPERCHOLESTEROLEMIA (ICD-272.0) - on SIMVASTATIN 40mg /d now... ~  [place] 1/08 on Lip10 showed TChol 183, TG 122, HDL 45, LDL 114...  ~  [place] 2/09 on Lip10 showed TChol 183, TG 189, HDL 39, LDL 107... continue diet & change to Simva40.  ~  FLP 5/09 on Simva40 showed TChol 185, TG 163, HDL 36, LDL 117... she wants to stay on Simva40. ~  FLP 2/10 showed TChol 176, Tg 160, HDL 39, LDL 105... rec> continue same. ~  FLP 4/11 showed TChol 160, TG 131, HDL 41, LDL 93 ~  FLP 4/12 showed TChol 174, TG 148, HDL 43, LDL 102 ~  FLP 5/13 showed TChol 159, TG 151, HDL 47, LDL 82 ~  FLP 5/14 on Simva40 showed TChol 165, TG 157, HDL 41, LDL 93 ~  FLP 11/15 on Simva40 showed TChol 169, TG 135, HDL 41, LDL 101  GERD (ICD-530.81) - on NEXIUM 40mg /d & ZANTAC 300mg Qhs... ~  EGD 6/07 w/ 3cmHH, stricture, gastitis (HPylori neg)... I offered to change her Nexium to a generic but she declined- "DrPatterson told me never to change this med"... she notes occas nocturnal reflux symptoms. ~  5/12: She  had GI eval by DrPatterson 5/12> chr GERD, hx 3cmHH, prev stricture dilated, hx colon polyps, etc> c/o incr reflux symptoms, bloating, pressure despite her Nexium40AM & Zantac300PM;  Nexium was increased to Bid, Carafate added Qid & she had EGD 5/12 showing mod gastritis w/ bx= chr inflamm, neg HPylori... ~  CTAbd 5/12 showed mult parenchymal nodules at lung bases (some fractionally larger), calcif hilar nodes bilat, no renal lesions (?left upper pole mass seen on sonar?). ~  EGD 3/13 by DrPatterson showed 3cmHH, esophagitis, prob "pill espohagitis", dilated... Symptoms resolved w/ Carafate, Nexium, Zantac, Xylocaine... ~  5/14: on Nexium40Bid, Zantac300HS, CarafateQid prn, & Zofran prn; she remains asymptomatic w/o abd pain, n/v, c/d, blood seen... ~  11/15: on Nexium40, Carafate1GmQhs, & Zofran prn; she has seen Laura [name] & remains asymptomatic on these meds- w/o abd pain, n/v, c/d, blood seen...  IRRITABLE BOWEL SYNDROME (ICD-564.1) COLONIC POLYPS, HX OF (ICD-V12.72)  ~  Colonoscopy 6/07 by DrPatterson showing divertics & polyps (adenomatous)... f/u planned 63yrs... ~  She had f/u colonoscopy 5/12 showing mod divertics, sessile polyp in cecum= serrated adenoma w/ f/u planned 3-70yrs...  UTI'S, CHRONIC (ICD-599.0) - eval by DrOttelin for urology... now off the Macrodantin and using cranberry juice without recurrent UTI, no problems...  DEGENERATIVE JOINT DISEASE (ICD-715.90) - eval by DrDalldorf w/ mod DJD in knees & hx of torn left meniscus... 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 overall improved now. ~  11/13:  TRAMADOL 50mg  Tid refilled for prn use... ~  2015-16: she's been eval by [facility] for Ortho> right & left knee DJD, right ankle tendonitis, right plantar fasciitis...  ~  4/16: she had right elbow surg by DrKuzma> mult granulomas on path c/w sarcoid nodule...  LOW BACK PAIN, CHRONIC  (ICD-724.2) ~  2/14: she fell while getting up at night & hit her back w/ severe pain & L1 compression fx- adm to [facility] for 2d w/ kyphoplasty &  good pain relief...  OSTEOPENIA (ICD-733.90) - on Calcium, MVI, Vit D... ~  BMD in 2005 showed TScore -1.2 in Spine, & -0.7 in [facility]. ~  BMD 5/09 showed TScores -1.5 in Spine & -1.1 in FemNecks. ~  BMD here 5/11 showed TScores -1.2 in Spine, and -1.2 in [facility]. ~  5/13 & 5/14:  She is due for f/u BMD but wants to wait for now... ~  Labs 11/15 showed VitD= 52... ~  BMD 2/16 revealed Tscore -1.8 in Lspine and Rt FemNeck; we rec FOSAMAX70/wk in light of her prev compression fx...  ANXIETY (ICD-300.00)  Hx of BASAL CELL SKIN CANCER - she still sees her Derm in [place] yearly...  Health Maintenance: ~  GI:  followed by DrPatterson/Laura [name] w/ colon as above... ~  GYN:  followed by DrRichardson & doing well by pt's report... Mammograms at the [place]... ~  Immunizations:  she gets yearly flu shots;  has PNEUMOVAX in 1998 & repeated 10/11 (age 30);  TDAP given 5/13;  we discussed Shingles vaccine (she had bout of shingles involving her right hip & leg ~age30) & she will decide.    Past Surgical History  Procedure Laterality Date  . Vesicovaginal fistula closure w/ tah    . Basal cell carcinoma excision    . Cataract extraction      right  . Removal of mass from rt antecubital fossa    . Abdominal hysterectomy    . Upper gastrointestinal endoscopy    . Colonoscopy  2012  . Mass excision Left 11/02/2014    Procedure: EXCISION MASS LEFT ELBOW;  Surgeon: Daryll Brod, MD;  Location: Cataract;  Service: Orthopedics;  Laterality: Left;    Outpatient Encounter Prescriptions as of 12/26/2015  Medication Sig  . aspirin 81 MG tablet Take 81 mg by mouth daily.    . B Complex Vitamins (VITAMIN-B COMPLEX PO) Take 1 capsule by mouth daily.   . Calcium Carbonate (CALTRATE 600 PO) Take 1 tablet by mouth 2 (two) times daily.     [name] diltiazem (CARDIZEM) 30 MG tablet Take 1 tablet (30 mg total) by mouth every 6 (six) hours. For esophageal spasm (Patient taking differently: Take 30 mg by mouth every 6 (six) hours as needed. For esophageal spasm)  . Esomeprazole Magnesium (NEXIUM 24HR) 20 MG TBEC Take 1 tablet by mouth 2 (two) times daily.  . Glucosamine HCl (GLUCOSAMINE PO) Liquid form - Take by mouth as directed once daily  . meclizine (ANTIVERT) 25 MG tablet Take 1/2-1 tablet by mouth every 4 hours as needed for dizziness  . Multiple Vitamins-Minerals (MULTIVITAL) tablet Take 1 tablet by mouth daily.    . Omega-3 Fatty Acids (FISH OIL) 1000 MG CAPS Take 1 capsule by mouth daily.    . ondansetron (ZOFRAN) 8 MG tablet 1 tablet by mouth every 6 hours as needed for nausea  . simvastatin (ZOCOR) 40 MG tablet TAKE 1 TABLET BY MOUTH EVERY NIGHT AT BEDTIME  . sucralfate (CARAFATE) 1 G tablet Take 1 tablet (1 g total) by mouth 2 (two) times daily.  . [DISCONTINUED] alendronate (FOSAMAX) 70 MG tablet Take 1 tablet (70 mg total) by mouth once a week. Take with a full glass of water on an empty stomach. (Patient not taking: Reported on 12/26/2015)  . [DISCONTINUED] Alendronate Sodium (BINOSTO) 70 MG TBEF Take 1 tablet by mouth once a week. (Patient not taking: Reported on 12/26/2015)  . [DISCONTINUED] benzonatate (TESSALON) 200 MG capsule Take 1 capsule (200  mg total) by mouth 3 (three) times daily as needed for cough. (Patient not taking: Reported on 12/26/2015)  . [DISCONTINUED] ciprofloxacin (CIPRO) 250 MG tablet Take 1 tablet (250 mg total) by mouth 2 (two) times daily. (Patient not taking: Reported on 12/26/2015)   No facility-administered encounter medications on file as of 12/26/2015.    Allergies  Allergen Reactions  . Prednisone     REACTION: unable to take this med due to an eye condition  . Amoxicillin-Pot Clavulanate     REACTION: causes diarrhea  . Codeine     REACTION: nausea  . Levaquin [Levofloxacin In D5w]     Had  itching w/ intravenous, not sure if reaction was from abx or the need to change the IV.  Does well when taken with Benadryl.  . Tdap [Diphth-Acell Pertussis-Tetanus]     Area raised, warm to touch, tender, swollen, pt felt jittery, nausea and some SOB  . Tape Rash    NEEDS PAPER TAPE    Review of Systems         See HPI - all other systems neg except as noted... The patient denies anorexia, fever, weight loss, weight gain, vision loss, decreased hearing, hoarseness, chest pain, syncope, dyspnea on exertion, peripheral edema, prolonged cough, headaches, hemoptysis, abdominal pain, melena, hematochezia, severe indigestion/heartburn, hematuria, incontinence, muscle weakness, suspicious skin lesions, transient blindness, difficulty walking, depression, unusual weight change, abnormal bleeding, enlarged lymph nodes, and angioedema.     Objective:   Physical Exam    WD, WN, 75 y/o WF in NAD... GENERAL:  Alert & oriented; pleasant & cooperative... HEENT:  [place]/AT, EOM-wnl, PERRLA, EACs-clear, TMs-wnl, NOSE-clear, THROAT-clear & wnl. NECK:  Supple w/ fairROM; no JVD; normal carotid impulses w/o bruits; no thyromegaly or nodules palpated; no lymphadenopathy. CHEST:  Clear to P & A; without wheezes/ rales/ or rhonchi. HEART:  Regular Rhythm; without murmurs/ rubs/ or gallops. ABDOMEN:  Soft & nontender; normal bowel sounds; no organomegaly or masses detected. EXT: without deformities, mild arthritic changes; no varicose veins/ +venous insuffic/ no edema. NEURO:  CN's intact;  no focal neuro deficits... DERM:  No lesions noted; no rash etc...  RADIOLOGY DATA:  Reviewed in the EPIC EMR & discussed w/ the patient...  LABORATORY DATA:  Reviewed in the EPIC EMR & discussed w/ the patient...   Assessment & Plan:    DYSPNEA w/ thorough PULM & CARDIAC eval>> underlying sarcoidosis w/ mild pulm fibrosis & prev no signs of dis activity- on watchful waiting protocol; Cards eval by Laura [name] was neg as  well x PVCs & PACs; she has improved on a gradual exercise program, avoids caffeine & stress...  SARCOID>  Previous CXR/ PFT/ ACE stable & she remains asymptomatic, nodule left elbow excised & path revealed nonnecrotizing granulomata... ~  12/2015>  FluA illness 09/2015 w/ CXR via ER showed some progression of the BHA & parenchymal abnormalities; CT Chest 12/2015 confirmed & ACE=62; We decided to start Rx w/ PRED 40-30-20 Q2wk taper w/ ROV/ CXR/ ACE in 6wks.  CHOL>  Stable on the Simva40 + diet...  GERD>  On Prilosec & Zantac, she is currently improved from her prev symptoms...  DJD/ LBP/ Osteopenia>  As noted she remains stable on current meds & exercise program; she will see Laura [name] about her ankle pain; BMD 2/16 w/ Tscore -1.8 & rec to start [place]...  Compression fx L1 after fall> she is s/p L1 kyphoplasty...  Other medical issues as noted...   Patient's Medications  New Prescriptions  PREDNISONE 20mg -.start w/ 40mg Qam for 2wks, then 30mg  Qam for 2wks, then 20mg  Qam til return.   Previous Medications   ASPIRIN 81 MG TABLET    Take 81 mg by mouth daily.     B COMPLEX VITAMINS (VITAMIN-B COMPLEX PO)    Take 1 capsule by mouth daily.    CALCIUM CARBONATE (CALTRATE 600 PO)    Take 1 tablet by mouth 2 (two) times daily.     DILTIAZEM (CARDIZEM) 30 MG TABLET    Take 1 tablet (30 mg total) by mouth every 6 (six) hours. For esophageal spasm   ESOMEPRAZOLE MAGNESIUM (NEXIUM 24HR) 20 MG TBEC    Take 1 tablet by mouth 2 (two) times daily.   GLUCOSAMINE HCL (GLUCOSAMINE PO)    Liquid form - Take by mouth as directed once daily   MECLIZINE (ANTIVERT) 25 MG TABLET    Take 1/2-1 tablet by mouth every 4 hours as needed for dizziness   MULTIPLE VITAMINS-MINERALS (MULTIVITAL) TABLET    Take 1 tablet by mouth daily.     OMEGA-3 FATTY ACIDS (FISH OIL) 1000 MG CAPS    Take 1 capsule by mouth daily.     ONDANSETRON (ZOFRAN) 8 MG TABLET    1 tablet by mouth every 6 hours as needed for nausea    SIMVASTATIN (ZOCOR) 40 MG TABLET    TAKE 1 TABLET BY MOUTH EVERY NIGHT AT BEDTIME   SUCRALFATE (CARAFATE) 1 G TABLET    Take 1 tablet (1 g total) by mouth 2 (two) times daily.  Modified Medications   No medications on file  Discontinued Medications   ALENDRONATE (FOSAMAX) 70 MG TABLET    Take 1 tablet (70 mg total) by mouth once a week. Take with a full glass of water on an empty stomach.   ALENDRONATE SODIUM (BINOSTO) 70 MG TBEF    Take 1 tablet by mouth once a week.   BENZONATATE (TESSALON) 200 MG CAPSULE    Take 1 capsule (200 mg total) by mouth 3 (three) times daily as needed for cough.   CIPROFLOXACIN (CIPRO) 250 MG TABLET    Take 1 tablet (250 mg total) by mouth 2 (two) times daily.

## 2015-12-27 LAB — ANGIOTENSIN CONVERTING ENZYME: Angiotensin-Converting Enzyme: 62 U/L — ABNORMAL HIGH (ref 8–52)

## 2015-12-28 ENCOUNTER — Other Ambulatory Visit: Payer: Self-pay | Admitting: Pulmonary Disease

## 2015-12-28 DIAGNOSIS — R0602 Shortness of breath: Secondary | ICD-10-CM

## 2016-01-03 ENCOUNTER — Other Ambulatory Visit (INDEPENDENT_AMBULATORY_CARE_PROVIDER_SITE_OTHER): Payer: Medicare Other

## 2016-01-03 ENCOUNTER — Encounter: Payer: Self-pay | Admitting: Internal Medicine

## 2016-01-03 ENCOUNTER — Ambulatory Visit (AMBULATORY_SURGERY_CENTER): Payer: Self-pay | Admitting: *Deleted

## 2016-01-03 ENCOUNTER — Telehealth: Payer: Self-pay | Admitting: *Deleted

## 2016-01-03 VITALS — Ht 68.0 in | Wt 172.6 lb

## 2016-01-03 DIAGNOSIS — Z860100 Personal history of colon polyps, unspecified: Secondary | ICD-10-CM

## 2016-01-03 DIAGNOSIS — R0602 Shortness of breath: Secondary | ICD-10-CM | POA: Diagnosis not present

## 2016-01-03 DIAGNOSIS — Z800 Family history of malignant neoplasm of digestive organs: Secondary | ICD-10-CM

## 2016-01-03 LAB — BASIC METABOLIC PANEL
BUN: 14 mg/dL (ref 6–23)
CO2: 29 meq/L (ref 19–32)
CREATININE: 0.74 mg/dL (ref 0.40–1.20)
Calcium: 9.4 mg/dL (ref 8.4–10.5)
Chloride: 104 mEq/L (ref 96–112)
GFR: 81.23 mL/min (ref >60.00)
GLUCOSE: 131 mg/dL — AB (ref 70–99)
POTASSIUM: 3.7 meq/L (ref 3.5–5.1)
Sodium: 141 mEq/L (ref 135–145)

## 2016-01-03 MED ORDER — NA SULFATE-K SULFATE-MG SULF 17.5-3.13-1.6 GM/177ML PO SOLN: 1.0000 | Freq: Once | ORAL | Status: DC

## 2016-01-03 NOTE — Progress Notes (Signed)
No egg or soy allergy known to patient   issues with past sedation with any surgeries  or procedures of post op nausea/vomiting , no intubation problems  No diet pills per patient No home 02 use per patient  No blood thinners per patient  Pt denies issues with constipation  emmi declined

## 2016-01-03 NOTE — Telephone Encounter (Signed)
Dr Hilarie Fredrickson,  I saw Mrs Pusateri in [place] this am. She is scheduled for a colonoscopy on 6-27 Tuesday at 81 am for hx colon polyps and [facility] in [place].  She saw you  in clinic 06-08-2015 for her reflux.  She is taking her nexium BID as directed, she has no issues with swallowing at the present time except a large pill has to be broken and if she does have any issues it seems like its in the throat only, reflux seems to be under control. She questioned the need for an EGD only because each colon she has had in the past Dr Sharlett Iles he has always done an egd for her as well. Her last egd was 2013, she had reflux, he stated ??? Stricture but he dialated her esophagus, she has HH.  Just want to see if we need to add EGD to her colon ? Please advise  Thanks, Marijean Niemann

## 2016-01-03 NOTE — Telephone Encounter (Signed)
Without hx of barrett's esophagus and in the absence of symptoms, I do not think she needs repeat EGD at this time

## 2016-01-06 ENCOUNTER — Ambulatory Visit (INDEPENDENT_AMBULATORY_CARE_PROVIDER_SITE_OTHER)
Admission: RE | Admit: 2016-01-06 | Discharge: 2016-01-06 | Disposition: A | Discharge status: Home / Self Care | Payer: Medicare Other | Source: Ambulatory Visit | Attending: Pulmonary Disease | Admitting: Pulmonary Disease

## 2016-01-06 ENCOUNTER — Ambulatory Visit (HOSPITAL_COMMUNITY)
Admission: RE | Admit: 2016-01-06 | Discharge: 2016-01-06 | Disposition: A | Discharge status: Home / Self Care | Payer: Medicare Other | Source: Ambulatory Visit | Attending: Pulmonary Disease | Admitting: Pulmonary Disease

## 2016-01-06 DIAGNOSIS — D869 Sarcoidosis, unspecified: Secondary | ICD-10-CM | POA: Insufficient documentation

## 2016-01-06 LAB — PULMONARY FUNCTION TEST
DL/VA % pred: 96 %
DL/VA: 5.04 ml/min/mmHg/L
DLCO UNC % PRED: 70 %
DLCO unc: 20.78 ml/min/mmHg
FEF 25-75 PRE: 1 L/s
FEF 25-75 Post: 1.33 L/sec
FEF2575-%CHANGE-POST: 33 %
FEF2575-%PRED-POST: 70 %
FEF2575-%Pred-Pre: 53 %
FEV1-%Change-Post: 5 %
FEV1-%Pred-Post: 77 %
FEV1-%Pred-Pre: 72 %
FEV1-POST: 1.92 L
FEV1-Pre: 1.82 L
FEV1FVC-%CHANGE-POST: 5 %
FEV1FVC-%Pred-Pre: 91 %
FEV6-%Change-Post: 1 %
FEV6-%Pred-Post: 83 %
FEV6-%Pred-Pre: 82 %
FEV6-PRE: 2.61 L
FEV6-Post: 2.65 L
FEV6FVC-%Change-Post: 1 %
FEV6FVC-%PRED-PRE: 103 %
FEV6FVC-%Pred-Post: 104 %
FVC-%CHANGE-POST: 0 %
FVC-%PRED-POST: 80 %
FVC-%PRED-PRE: 80 %
FVC-PRE: 2.66 L
FVC-Post: 2.66 L
POST FEV1/FVC RATIO: 72 %
PRE FEV6/FVC RATIO: 98 %
Post FEV6/FVC ratio: 100 %
Pre FEV1/FVC ratio: 68 %
RV % PRED: 108 %
RV: 2.72 L
TLC % pred: 98 %
TLC: 5.57 L

## 2016-01-06 MED ORDER — ALBUTEROL SULFATE (2.5 MG/3ML) 0.083% IN NEBU
2.5000 mg | INHALATION_SOLUTION | Freq: Once | RESPIRATORY_TRACT | Status: AC
  Administered 2016-01-06: 2.5 mg via RESPIRATORY_TRACT

## 2016-01-06 MED ORDER — IOPAMIDOL (ISOVUE-300) INJECTION 61%
80.0000 mL | Freq: Once | INTRAVENOUS | Status: AC | PRN
  Administered 2016-01-06: 80 mL via INTRAVENOUS

## 2016-01-09 ENCOUNTER — Encounter: Payer: Self-pay | Admitting: Pulmonary Disease

## 2016-01-11 ENCOUNTER — Telehealth: Payer: Self-pay | Admitting: Internal Medicine

## 2016-01-11 NOTE — Telephone Encounter (Signed)
Pt states Dr Lenna Gilford has started her on 40 mg of prednisone and was not sure if this would interfere with her colon procedure. Instructed pt this should be fine, call with further questions Lelan Pons PV

## 2016-01-17 ENCOUNTER — Encounter: Payer: Self-pay | Admitting: Internal Medicine

## 2016-01-17 ENCOUNTER — Ambulatory Visit (AMBULATORY_SURGERY_CENTER): Payer: Medicare Other | Admitting: Internal Medicine

## 2016-01-17 VITALS — BP 148/68 | HR 65 | Temp 97.1°F | Resp 12 | Ht 68.0 in | Wt 173.0 lb

## 2016-01-17 DIAGNOSIS — Z800 Family history of malignant neoplasm of digestive organs: Secondary | ICD-10-CM

## 2016-01-17 DIAGNOSIS — D120 Benign neoplasm of cecum: Secondary | ICD-10-CM | POA: Diagnosis not present

## 2016-01-17 DIAGNOSIS — D122 Benign neoplasm of ascending colon: Secondary | ICD-10-CM

## 2016-01-17 DIAGNOSIS — Z86010 Personal history of colonic polyps: Secondary | ICD-10-CM

## 2016-01-17 MED ORDER — SODIUM CHLORIDE 0.9 % IV SOLN: 500.0000 mL | INTRAVENOUS | Status: DC

## 2016-01-17 NOTE — Op Note (Signed)
[facility] Patient Name: Laura [name] Procedure Date: 01/17/2016 8:28 AM MRN: [id number] Endoscopist: Jerene Bears , MD Age: 75 Referring MD:  Date of Birth: 06-Oct-1940 Gender: Female Account #: [id number] Procedure:                Colonoscopy Indications:              Surveillance: Personal history of adenomatous                            polyps on last colonoscopy 5 years ago, Family                            history of colon cancer in a first-degree relative Medicines:                Monitored Anesthesia Care Procedure:                Pre-Anesthesia Assessment:                           - Prior to the procedure, a History and Physical                            was performed, and patient medications and                            allergies were reviewed. The patient's tolerance of                            previous anesthesia was also reviewed. The risks                            and benefits of the procedure and the sedation                            options and risks were discussed with the patient.                            All questions were answered, and informed consent                            was obtained. Prior Anticoagulants: The patient has                            taken no previous anticoagulant or antiplatelet                            agents. ASA Grade Assessment: III - A patient with                            severe systemic disease. After reviewing the risks                            and benefits, the patient was deemed in  satisfactory condition to undergo the procedure.                           After obtaining informed consent, the colonoscope                            was passed under direct vision. Throughout the                            procedure, the patient's blood pressure, pulse, and                            oxygen saturations were monitored continuously. The                            Model PCF-H190DL  [id number]) scope was introduced                            through the anus and advanced to the the cecum,                            identified by appendiceal orifice and ileocecal                            valve. The colonoscopy was performed without                            difficulty. The patient tolerated the procedure                            well. The quality of the bowel preparation was                            good. The ileocecal valve, appendiceal orifice, and                            rectum were photographed. Scope In: 8:36:34 AM Scope Out: 8:56:40 AM Scope Withdrawal Time: 0 hours 15 minutes 8 seconds  Total Procedure Duration: 0 hours 20 minutes 6 seconds  Findings:                 The digital rectal exam findings include                            non-thrombosed external hemorrhoids.                           Two sessile polyps were found in the cecum. The                            polyps were 4 to 6 mm in size. These polyps were                            removed with a cold snare. Resection and retrieval  were complete.                           A 5 mm polyp was found in the ascending colon. The                            polyp was sessile. The polyp was removed with a                            cold snare. Resection and retrieval were complete.                           Multiple small and large-mouthed diverticula were                            found in the sigmoid colon.                           External hemorrhoids were found during retroflexion                            and during perianal exam. The hemorrhoids were                            small. Complications:            No immediate complications. Estimated Blood Loss:     Estimated blood loss was minimal. Impression:               - Two 4 to 6 mm polyps in the cecum, removed with a                            cold snare. Resected and retrieved.                           -  One 5 mm polyp in the ascending colon, removed                            with a cold snare. Resected and retrieved.                           - Diverticulosis in the sigmoid colon.                           - Small external hemorrhoid. Recommendation:           - Patient has a contact number available for                            emergencies. The signs and symptoms of potential                            delayed complications were discussed with the  patient. Return to normal activities tomorrow.                            Written discharge instructions were provided to the                            patient.                           - Resume previous diet.                           - Continue present medications.                           - Await pathology results.                           - Repeat colonoscopy is recommended for                            surveillance. The colonoscopy date will be                            determined after pathology results from today's                            exam become available for review. Jerene Bears, MD 01/17/2016 9:04:31 AM This report has been signed electronically.

## 2016-01-17 NOTE — Progress Notes (Signed)
Called to room to assist during endoscopic procedure.  Patient ID and intended procedure confirmed with present staff. Received instructions for my participation in the procedure from the performing physician.  

## 2016-01-17 NOTE — Patient Instructions (Signed)
YOU HAD AN ENDOSCOPIC PROCEDURE TODAY AT [facility]:   Refer to the procedure report that was given to you for any specific questions about what was found during the examination.  If the procedure report does not answer your questions, please call your gastroenterologist to clarify.  If you requested that your care partner not be given the details of your procedure findings, then the procedure report has been included in a sealed envelope for you to review at your convenience later.  YOU SHOULD EXPECT: Some feelings of bloating in the abdomen. Passage of more gas than usual.  Walking can help get rid of the air that was put into your GI tract during the procedure and reduce the bloating. If you had a lower endoscopy (such as a colonoscopy or flexible sigmoidoscopy) you may notice spotting of blood in your stool or on the toilet paper. If you underwent a bowel prep for your procedure, you may not have a normal bowel movement for a few days.  Please Note:  You might notice some irritation and congestion in your nose or some drainage.  This is from the oxygen used during your procedure.  There is no need for concern and it should clear up in a day or so.  SYMPTOMS TO REPORT IMMEDIATELY:   Following lower endoscopy (colonoscopy or flexible sigmoidoscopy):  Excessive amounts of blood in the stool  Significant tenderness or worsening of abdominal pains  Swelling of the abdomen that is new, acute  Fever of 100F or higher   For urgent or emergent issues, a gastroenterologist can be reached at any hour by calling [phone].   DIET: Your first meal following the procedure should be a small meal and then it is ok to progress to your normal diet. Heavy or fried foods are harder to digest and may make you feel nauseous or bloated.  Likewise, meals heavy in dairy and vegetables can increase bloating.  Drink plenty of fluids but you should avoid alcoholic beverages for 24  hours.  ACTIVITY:  You should plan to take it easy for the rest of today and you should NOT DRIVE or use heavy machinery until tomorrow (because of the sedation medicines used during the test).    FOLLOW UP: Our staff will call the number listed on your records the next business day following your procedure to check on you and address any questions or concerns that you may have regarding the information given to you following your procedure. If we do not reach you, we will leave a message.  However, if you are feeling well and you are not experiencing any problems, there is no need to return our call.  We will assume that you have returned to your regular daily activities without incident.  If any biopsies were taken you will be contacted by phone or by letter within the next 1-3 weeks.  Please call us at [phone] if you have not heard about the biopsies in 3 weeks.    SIGNATURES/CONFIDENTIALITY: You and/or your care partner have signed paperwork which will be entered into your electronic medical record.  These signatures attest to the fact that that the information above on your After Visit Summary has been reviewed and is understood.  Full responsibility of the confidentiality of this discharge information lies with you and/or your care-partner.  Polyps, diverticulosis, high fiber diet, and hemorrhoids handouts given. Repeat colonoscopy per results of pathology. Repeat colonoscopy per results of pathology.

## 2016-01-17 NOTE — Progress Notes (Signed)
Report to PACU, RN, vss, BBS= Clear.  

## 2016-01-18 ENCOUNTER — Telehealth: Payer: Self-pay

## 2016-01-18 NOTE — Telephone Encounter (Signed)
  Follow up Call-  Call back number 01/17/2016  Post procedure Call Back phone  # [phone]  Permission to leave phone message Yes     Patient questions:  Do you have a fever, pain , or abdominal swelling? No. Pain Score  0 *  Have you tolerated food without any problems? Yes.    Have you been able to return to your normal activities? Yes.    Do you have any questions about your discharge instructions: Diet   No. Medications  No. Follow up visit  No.  Do you have questions or concerns about your Care? No.  Actions: * If pain score is 4 or above: No action needed, pain <4.

## 2016-01-24 ENCOUNTER — Encounter: Payer: Self-pay | Admitting: Internal Medicine

## 2016-02-15 DIAGNOSIS — Z880 Allergy status to penicillin: Secondary | ICD-10-CM | POA: Diagnosis not present

## 2016-02-15 DIAGNOSIS — H35373 Puckering of macula, bilateral: Secondary | ICD-10-CM | POA: Diagnosis not present

## 2016-02-15 DIAGNOSIS — H40043 Steroid responder, bilateral: Secondary | ICD-10-CM | POA: Diagnosis not present

## 2016-02-15 DIAGNOSIS — Z79899 Other long term (current) drug therapy: Secondary | ICD-10-CM | POA: Diagnosis not present

## 2016-02-15 DIAGNOSIS — H5213 Myopia, bilateral: Secondary | ICD-10-CM | POA: Diagnosis not present

## 2016-02-15 DIAGNOSIS — Z961 Presence of intraocular lens: Secondary | ICD-10-CM | POA: Diagnosis not present

## 2016-02-15 DIAGNOSIS — H02839 Dermatochalasis of unspecified eye, unspecified eyelid: Secondary | ICD-10-CM | POA: Diagnosis not present

## 2016-02-15 DIAGNOSIS — H02833 Dermatochalasis of right eye, unspecified eyelid: Secondary | ICD-10-CM | POA: Diagnosis not present

## 2016-02-15 DIAGNOSIS — Z9842 Cataract extraction status, left eye: Secondary | ICD-10-CM | POA: Diagnosis not present

## 2016-02-15 DIAGNOSIS — Z7982 Long term (current) use of aspirin: Secondary | ICD-10-CM | POA: Diagnosis not present

## 2016-02-15 DIAGNOSIS — H11823 Conjunctivochalasis, bilateral: Secondary | ICD-10-CM | POA: Diagnosis not present

## 2016-02-15 DIAGNOSIS — H52203 Unspecified astigmatism, bilateral: Secondary | ICD-10-CM | POA: Diagnosis not present

## 2016-02-15 DIAGNOSIS — Z885 Allergy status to narcotic agent status: Secondary | ICD-10-CM | POA: Diagnosis not present

## 2016-02-15 DIAGNOSIS — E7800 Pure hypercholesterolemia, unspecified: Secondary | ICD-10-CM | POA: Diagnosis not present

## 2016-02-15 DIAGNOSIS — Z9841 Cataract extraction status, right eye: Secondary | ICD-10-CM | POA: Diagnosis not present

## 2016-02-15 DIAGNOSIS — H524 Presbyopia: Secondary | ICD-10-CM | POA: Diagnosis not present

## 2016-02-15 DIAGNOSIS — H43813 Vitreous degeneration, bilateral: Secondary | ICD-10-CM | POA: Diagnosis not present

## 2016-02-22 ENCOUNTER — Encounter: Payer: Self-pay | Admitting: Pulmonary Disease

## 2016-02-22 ENCOUNTER — Ambulatory Visit (INDEPENDENT_AMBULATORY_CARE_PROVIDER_SITE_OTHER): Payer: Medicare Other | Admitting: Pulmonary Disease

## 2016-02-22 ENCOUNTER — Encounter (INDEPENDENT_AMBULATORY_CARE_PROVIDER_SITE_OTHER): Payer: Self-pay

## 2016-02-22 ENCOUNTER — Other Ambulatory Visit: Payer: Medicare Other

## 2016-02-22 VITALS — BP 128/74 | HR 61 | Temp 98.0°F | Ht 68.0 in | Wt 170.2 lb

## 2016-02-22 DIAGNOSIS — D869 Sarcoidosis, unspecified: Secondary | ICD-10-CM

## 2016-02-22 NOTE — Progress Notes (Signed)
Subjective:    Patient ID: Laura [name], female    DOB: 1941/03/08, 75 y.o.   MRN: [id number]  HPI 75 y/o WF here for a follow up visit... she has prob Sarcoidosis w/ hx Uveitis and BHA on CXR- we are following a "watchful waiting" protocol...  ~  Nov 21, 2011:  37moROV & LGavrielais doing reasonably well w/o new complaints or concerns;     She [facility] an [facility] [place] [facility]13 she developed an "irritation in my esoph" dyspagia/ odynophagia; EGD by DrPatterson revealed "Pill esophagitis" & she was treated w/ Carafate (off now), Nexium Bid, & Zantac 300Qhs;  improved now on & she will check w/ GI about a formulary PPI med...    We reviewed prob list, meds, xrays, & labs> see below>>   LABS 5/13:  FLP- at goals on Simva40;  Chems- wnl;  CBC- wnl;  TSH=1.61;  VitD=47  ADDENDUM>> she received TDAP w/ local reaction req antihist & topical Rx...  ~  May 23, 2012:  [name] & [name] reports some vertigo while on vacation recently & improved w/ Antivert...    Sarcoid> Hx abn CXR, she is asymptomatic; CXR today= right suprahilar nodule (likely sarcoid scarring) & chr changes...    MVP> on ASA81;  She denies CP, palpit, SOB, etc...     Chol> on Simva40; FLP 5/13 looked ok w/ TChol 159, TG 151, HDL 47, LDL 82; reviewed diet & wt reduction...    GI> GERD, IBS, Polyp> on Nexium40, Zantac300HS, CarafateQid prn, & Zofran prn;     UTIs> she reports no prob on Cranberry juice...    DJD, LBP, Osteop> on calium, MVI, VitD; we refilled Tramadol for prn use...    Anxiety> she has a lot of family support, doesn't require meds... We reviewed prob list, meds, xrays and labs> see below for updates >> OK Flu shot today...  CXR 11/13 showed similar changes- irreg nodular changes in right suprahilar region, scarring, stable adenopathy, mild atherosclerotic calcif, osteopenia...   ~  Dec 02, 2012:  [name] & [name] that in Feb2014 she fell while getting up at night & hit her  back w/ severe pain & L1 compression fx- [facility] & good pain relief; also told to have LLLpneumonia treated w/ Levaquin; she had f/u here by TP 3/14 & improved; since then she is back to normal- had nice [place] vacation, feeling well, etc...  We reviewed the following medical problems during today's office visit >>     Sarcoid> Hx abn CXR, she is asymptomatic; baseline CXR w/ right suprahilar nodule, adenop, scarring & chr changes; ?LLLpneum 09[place]01/08 [place]/ [place]quin...    MVP> on ASA81;  She denies CP, palpit, SOB, etc...     Chol> on Simva40; FLP 5/14 showed TChol 165, TG 157, HDL 41, LDL 93; reviewed diet & wt reduction...    GI> GERD, IBS, Polyp> on Nexium40Bid, Zantac300HS, CarafateQid prn, & Zofran prn; she remains asymptomatic w/o abd pain, n/v, c/d, blood seen...    UTIs> she reports no prob on Cranberry juice...    DJD, LBP, L1 compression w/ kyphoplasty, Osteop> on calium, MVI, VitD; we refilled Tramadol for prn use; she [place] 2[place]14 [place]/ [place]1 [place].    Anxiety> she has a lot of family support, doesn't require meds... We reviewed prob list, meds, xrays and labs>  see below for updates >>   CXR 3/14 showed normal heart size, atherosclerotic Ao, chr scarring from underlying sarcoid, NAD...  LABS 5/14:  FLP- at goals on Simva40;  Chems- wnl;  CBC- wnl;  TSH=1.74;  VitD=52...  ~  June 03, 2013:  [name] & [name] has had some prob w/ pain & swelling in right ankle- she has appt w/ Ortho this afternoon (DrDaldorf)...     Hx prob Sarcoid and abn CXR; she remains asymptomatic & last film 3/14 showed stable scarring, NAD...     She remains on Simva40 for Chol; Labs 5/14 looked ok & we reviewed diet, exercise, & wt reduction strategies...    She has Hx GERD, IBS, colon polyps> on Nexium40Qam, Zantac300Qhs, Carafate 1gmQidPrn & doing fine w/o abd apin, dysphagia, n/v, c/d, blood seen; last colon was  5/12...    She has hx DJD, LBP, L1 compression w/ kyphoplasty; on Tramadol prn, takes calcium, MVI, VitD; last BMD was done here 5/11 w/ TScores -1.2 in Spine and FemNecks... We reviewed prob list, meds, xrays and labs> see below for updates >> she had the 2014 flu vaccine in Oct...   ~  Dec 09, 2013:  614moOV & [name] saw TP several months ago c/o dyspnea and fatigue> she's been sedentary due to foot problem & when she tried to walk she noted trouble w/ hills, no CP, etc; hx sarcoid but she has never required treatment as it has been felt to be inactive- f/u CXR unchanged; felt to likely be deconditioning but referred to Cards for their eval as well...    Seen by DrTaylor for CaTribune CompanyDEcho showed norm LVF, norm valves, no signs of pulmHTN; subdeq stress testing showed extreme dyspnea w/ exercise (poor functional capacity), no ischemia; she has been encouraged to join silver sneakers and incr her exercise program...     Seen by DrPyrtle for GI> c/o epig discomfort, hx GERD/ IBS/ colon polyps; symptoms improved by incr Nexium to Bid & adding Carafate prn; she is up to date on screening...  We reviewed prob list, meds, xrays and labs> see below for updates >>   LABS 1/15:  Chems- ok x K=3.4;  CBC- wnl;  TSH=2.28;  Mag=2.0...Marland KitchenMarland KitchenCXR 1/15 showed norm heart size, areas of scarring appear unchanged, prom hilae w/o change- stable, NAD...Marland KitchenMarland Kitchen EKG 3/15 showed NSR, rate79, wnl, NAD...  Stress Test 3/15 showed poor exercise capacity, no ischemia...  2DEcho 3/15 showed normal LVF w/ EF=55-60%, no regional wall motion abn, normal valves, no evid for pulmHTN...  PFTs 5/15 showed just some sm airways dis> FVC=2.65 (78%), FEV1=1.86 (72%); %1sec=70; mid-flows=55% predicted... After bronchodil her FEV1 improved 6%... Lung Volumes & DLCO are wnl...   LABS 5/15:  ACE level = 55     ~  June 07, 2014:  39m239moV & [name] is improved> she has been exercising at home 2-3d/wk & [facility] 2d[facility]; she notes less dyspnea w/  walking, on hills, etc...  She saw DrPyrtle 6/15- Hx GERD w/ esoph, IBS, adenomatous colon polyps; on Nexium40/d and Carafate 1tmQhs and doing well w/o breakthrough symptoms...  We reviewed the following medical problems during today's office visit >>     Sarcoid> Hx abn CXR, she is asymptomatic; baseline CXR w/ right suprahilar nodule, adenop, scarring & chr changes; ?LLLpneum 09/05/72 [place]/ [place]; last CXR 1/15 was stable and last ACE=55 ...    MVP> on ASA81;  She denies CP, palpit, SOB, etc...[name]  Chol> on Simva40 & FishOil; last FLP 11/15 showed TChol 169, TG 135, HDL 41, LDL 101; reviewed diet & wt reduction...    GI> GERD, IBS, Polyp> on Nexium40, Carafate1GmQhs, & Zofran prn; she remains asymptomatic w/o abd pain, n/v, c/d, blood seen...    UTIs> she reports no prob on Cranberry juice...    DJD, LBP, L1 compression w/ kyphoplasty, Osteop> on calium, MVI, VitD & OTC analgesics; she fell 2/14 w/ L1 compression & kyphoplasty in [place]; she is ready for f/u BMD=> pending...    Anxiety> she has a lot of family support, doesn't require meds... We reviewed prob list, meds, xrays and labs> see below for updates >> she had the 2015 Flu shot recently, she is in need of the Prevnar-13 vaccine but wants to wait...   LABS 11/15:  FLP- looks good on Simva40, needs to lose wt;  Chems- wnl;  CBC- wnl;  TSH=2.19;  VitD=52...  ~  Dec 06, 2014:  1moRKaltaghad left elbow surg for a nodule & excision by DMelrose Nakayamashowed path=numerous nonnecrotizing granuloma, spec stains neg & this is c/w an unusual manifestation of her Sarcoid; she was told "scar tissue" and is improved now... We reviewed the following medical problems during today's office visit >>     Sarcoid> Hx abn CXR, she is asymptomatic; baseline CXR w/ right suprahilar nodule, adenop, scarring & chr changes; ?LLLpneum 25/46[place]/ [place]; last CXR 1/15 was stable and last ACE=55; she had nodule excised from left elbow  4/16 by DrKuzma=> granulomatous nodule c/w unusual manifewstation of Sarcoid, all spec stains were neg...    HxMVP, palpit w/ PVCs & PACs on Holter> on ASA81;  She denies CP, palpit, SOB now; advised no caffeine & avoid stress!    Chol> on Simva40 & FishOil; last FLP 11/15 showed TChol 169, TG 135, HDL 41, LDL 101; reviewed diet & wt reduction...    GI> GERD, IBS, Polyp> on Prilosec40Bid, Carafate1GmQhs, & Zofran prn; followed by DrJacobs- she remains asymptomatic w/o abd pain, n/v, c/d, blood seen...    UTIs> she reports no prob on Cranberry juice but had one recurrent UTI 4/16- Cipro cqalled in & symptoms resolved....    DJD, LBP, L1 compression w/ kyphoplasty, Osteop> on calcium, MVI, VitD & Tramadol50; she [place] 2[place]14 [place] w/ Tscore-1.8 & we rec ALENDRONATE70/wk...    Anxiety> she has a lot of family support, doesn't require meds... We reviewed prob list, meds, xrays and labs> see below for updates >>   CXR 5/16 showed stable BHA & scarring on right, NAD, no change...  LABS 5/16:  ACE=56, stable...  ~  June 08, 2015:  697moOSte. Genevievead f/u DrPyrtle recently- GERD, esoph spasm, IBS, colon polyps & +FamHx colon ca in sister> Rx w/ Nexium20Bid, Carafate prn, Cardizem30 prn esoph spasm and sheis improved ;  She also had f/u Cards DrTaylor 12/23/14- palpit w/ PVCs and PACs, improved and rec to f/u prn... Note that she is off prev Fosamax Rx- took it for 70m2mostopped due to reflux- offered Binoso but she prefers off Rx (BMD 2/16- Tscore -1.8) & she takes Ca & VitD supplement... Prob list as above> EXAM shows Afeb, VSS, O2sat=95% on RA;  HEENT- neg, Mallampati1;  Chest- clear w/o w/r/r;  Heart- RR w/o m/r/g;  Abd- soft, nontender, neg;  Ext- neg w/o c/c/e;  Neuro- intact w/o focal abn...  LABS 06/07/16>  FLP-  all parameters at goals on Simva40;  Chems- wnl;  CBC- wnl;  TSH=1.86;  VitD=52;  ACE=63;   IMP/PLAN>>  Bradleigh is stable on current regimen-  continue same meds and f/u in 49mo..  ~  December 26, 2015:  6-728moOGreenwaldemains asymptomatic, feeling well, and denies CP/ palpit. Cough/ phlegm/ hemoptysis, SOB, edema, etc;  She tells me that she got the Flu at the end of March noting HA, sore throat, severe cough w/o much sput, low grade temp, aching & sore, etc & was seen in the ER where CXR showed bilat hilar adenopathy & increased curvilinear thickening on the right but no acute process;  Flu panel was POS for TypeA flu;  She had f/u OV w/ TP shortly thereafter & given ZPak, Tessalon, Mucinex, Delsym, fluids;  She notes that it took her 8wks to fully resolve & get her energy back!  We reviewed the following medical problems during today's office visit >>     Ophthalmology> Hx uveitis, s[facility]p [facility] cat surg[facility]    Sarcoid> Hx uveitis & abn CXR w/ BHA, she is asymptomatic; baseline CXR w/ adenop, scarring & chr changes; hx ?[place]neum [place]/ [place]; last CXR 5/16 was stable and last ACE=56; she had nodule excised from left elbow 4/16 by DrKuzma=> granulomatous nodule c/w unusual manifestation of Sarcoid, all spec stains were neg...    HxMVP, palpit w/ PVCs & PACs on Holter> on ASA81;  She denies CP, palpit, SOB now; advised no caffeine & avoid stress!    Chol> on Simva40 & FishOil; last FLP 11/15 showed TChol 169, TG 135, HDL 41, LDL 101; reviewed diet & wt reduction...    GI> GERD, IBS, Polyp> on Prilosec40Bid, Carafate1GmQhs, & Zofran prn; followed by DrJacobs- she remains asymptomatic w/o abd pain, n/v, c/d, blood seen...    UTIs> she reports no prob on Cranberry juice but had one recurrent UTI 4/16- Cipro cqalled in & symptoms resolved....    DJD, LBP, L1 compression w/ kyphoplasty, Osteop> on calcium, MVI, VitD & Tramadol50; she [place] 2/14 w/ [place] w/ Tscore-1.8 & we rec ALENDRONATE70/wk...    Anxiety> she has a lot of family support, doesn't require meds... EXAM  shows Afeb, VSS, O2sat=97% on RA;  HEENT- neg, Mallampati1;  Chest- clear w/o w/r/r;  Heart- RR w/o m/r/g;  Abd- soft, nontender, neg;  Ext- neg w/o c/c/e;  Neuro- intact w/o focal abn...  CXR 10/16/15> I have reviewed this film & compared to prev images>  bilat hilar adenopathy & increased opac in left superior hilum and right lat hilar region w/ extension into ant RUL (similar to 11/2014 films but progressed from previous CXRs) => we will proceed w/ f/u CT Chest...  LABS 10/16/15>  Flu panel showed POS FluA;  Neg FluB and H1N1...   LABS 12/2015>  ACE=62 (nl=8-52);  VitD=51;  BMet- ok x BS=131, Cr=0.74, Ca=9.4...Marland KitchenMarland KitchenCT Chest w/ contrast 01/06/16>  Norm heart size, aortic & coronary atherosclerosis, part calcif mediastinal adenopathy (unchanged from 2009), progression of bilat hilar adenopathy & pulm parenchymal changes w/ mass-like density in RML~4cm, and mass-like density in ant LUL ~4x2cm -- radiology feels this is c/w sarcoid progression.   Full PFTs 01/06/16>  FVC=2.66 (80%), FEV1=1.82 (72%), %1sec=68, mid-flows rediced at 53% predicted; post bronchodil FEV1 improved 5% to 1.92;  TLC=5.57 (98%), RV=2.72 (108%), RV/TLC=49%;  DLCO=70% pred (DL/VA=96%). IMP/PLAN>>  Concern for active sarcoidosis w/ the recent CXR  and CT changes; she has never had a biopsy due to remote presentation w/ BHA and uveitis and the fact that she has been devoid of resp symptoms, and prev had neg ACE levels (ie- no signs of dis activity);  Her recent CXR changes (progression), sl elev ACE in the low 60s, suggests some activity yet she remains stoic & asymptomatic;  She would like to proceed w/o lung Bx if poss & I discussed trial PREDNISONE w/ short term f/u to check ACE & CXR (if not improved then we will address need for lung bx at that time);  REC to start Pred40/d x2wks, 30/d x2wks, then 20/d til return in about 6wks time...   ~  February 22, 2016:  31moROV & f/u sarcoid> In Jun2017 we did CTChest showing norm heart size, aortic &  coronary atherosclerosis, part calcif mediastinal adenopathy (unchanged from 2009), progression of bilat hilar adenopathy & pulm parenchymal changes w/ mass-like density in RML~4cm, and mass-like density in ant LUL ~4x2cm -- radiology feels this is c/w sarcoid progression;  ACE level= 62;  We discussed poss Bx but elected to start Pred therapy w/ 40-30-20 Q2wks and she returns today on Pred20/d noting various symptoms related to the Pred she feels eg- Ha, sweating, trembling, incr HR, & short tempered; these symptoms have diminished w/ decr in the dose; she also notes SOB, can't get a DB, & denies CP- but she has stepped up her walking program & doing better now;  We discussed checking ACE level & weaning Pred further based on this result...    EXAM shows Afeb, VSS, O2sat=97% on RA;  HEENT- neg, Mallampati1;  Chest- clear w/o w/r/r;  Heart- RR w/o m/r/g;  Abd- soft, nontender, neg;  Ext- neg w/o c/c/e;  Neuro- intact w/o focal abn...  LABS 02/2016>  BMet- ok w/ BS=100, A1c= 6.7, Cr=0.81, K=3.9;  ACE=23... IMP/PLAN>>  LZebaseems somewhat improved on the Pred & ACE is down to 23;  I suggest that she keep the Pred20/d for another 2wks then cut it to '20mg'$  alt w/ '10mg'$  Qod til return in 6 wks time...          Problem List:  Hx of UVEITIS (ICD-364.3) [facility] given [facility] in [facility]... s/p right cataract surg 2/09 and no active uveitis seen... ~  4/11:  she tells me she was seen 10/10 & doing well, may need left cataract surg soon. ~  [facility]  DrDickinson noted hx uveitis- steroid responder, s/p bilat cat surg w/ lens replacement, epiretinal membrane & post vitreous detachments...   R/O SARCOIDOSIS (ICD-135) - [facility]/u CXR w/ CTChest showing bilat hilar adenopathy, and w/u showing neg PPD, ACE=53, sed=13... prev PFT's 1/08 showed FVC 3.75 (111%), FEV1 2.74 (102%), %1sec=73, mid-flows=70%... being followed w/ serial  films and "watchful waiting"... there is a family hx of sarcoidosis in one brother (severe dis w/ neuropathy). ~   we had a f/u CXR in Feb09- streaky opacity right mid lung zone, similar to 2008...  ~  CT Chest 7/08 & 6/09 showed mediastinal > hilar adenopathy some w/ calcif (generally smaller than 1/08), area of atx/ scarring right mid zone (infer RUL) & scat <1cm nodules in lower zones w/o change... ~  CXR 2/10 showed bilat hilar prominence & scarring appears the same- NAD... labs all WNL w/ ACE=56. ~  CXR 4/11 showed mild bilat hilar prominence, scarring, NAD- osteopenia &  DJD in TSpine... ACE= 37. ~  CXR 4/12 showed stable bilat hilar prominence, scarring, NAD.[name]. ~  CXR 11/13 showed similar changes- irreg nodular changes in right suprahilar region, scarring, stable adenopathy, mild atherosclerotic calcif, osteopenia... ~  CXR 3/14 showed normal heart size, atherosclerotic Ao, chr scarring from underlying sarcoid, NAD.[name]. ~  CXR 1/15 showed norm heart size, areas of scarring appear unchanged, prom hilae w/o change- stable, NAD ~  PFTs 5/15 showed just some sm airways dis> FVC=2.65 (78%), FEV1=1.86 (72%); %1sec=70; mid-flows=55% predicted... After bronchodil her FEV1 improved 6%... Lung Volumes & DLCO are wnl. ~  LABS 5/15:  ACE level = 55  ~  4/16: she had nodule excised from left elbow 4/16 by DrKuzma=> granulomatous nodule c/w unusual manifewstation of Sarcoid, all spec stains were neg... ~  5/16: CXR 5/16 showed stable BHA & scarring on right, NAD, no change... LABS 5/16:  ACE=56, stable. ~  2017>  +FluA illness 09/2015 w/ ER eval & CXR showing progressive abn;  CT Chest 12/2015 confirmed progression of BHA & LUL/RML parenchymal densities felt to be c/w active Sarcoid, ACE=62;  Pt wanted to avoid Bx if poss & we decided on a course of Pred 40-30-20 Q2wk taper w/ ROV in 6wks for f/u=> ACE improved to 23 and we weaned Pred further to '20mg'$  alt w/ '10mg'$  Qod til return...   PALPITATIONS >>  Hx MITRAL  VALVE PROLAPSE >> on ASA '81mg'$ /d... clinical dx w/ 2DEcho 1999 showing flat closure but no frank prolapse... ~  2/16: she had a cardiac eval by DrTaylor for dyspnea> 2DEcho was neg, wnl; stress testing was neg- w/o ischemia; noted to have poor functional capacity & is way too sedentary; she had some palpit & monitor revealed PVCs & PACs- didn't want meds therefore asked to avoid caffeine 7 stress...  HYPERCHOLESTEROLEMIA (ICD-272.0) - on SIMVASTATIN '40mg'$ /d now... ~  [place] 1/08 on Lip10 showed TChol 183, TG 122, HDL 45, LDL 114...  ~  [place] 2/09 on Lip10 showed TChol 183, TG 189, HDL 39, LDL 107... continue diet & change to Simva40.  ~  FLP 5/09 on Simva40 showed TChol 185, TG 163, HDL 36, LDL 117... she wants to stay on Simva40. ~  FLP 2/10 showed TChol 176, Tg 160, HDL 39, LDL 105... rec> continue same. ~  FLP 4/11 showed TChol 160, TG 131, HDL 41, LDL 93 ~  FLP 4/12 showed TChol 174, TG 148, HDL 43, LDL 102 ~  FLP 5/13 showed TChol 159, TG 151, HDL 47, LDL 82 ~  FLP 5/14 on Simva40 showed TChol 165, TG 157, HDL 41, LDL 93 ~  FLP 11/15 on Simva40 showed TChol 169, TG 135, HDL 41, LDL 101  GERD (ICD-530.81) - on NEXIUM '40mg'$ /d & ZANTAC '300mg'$ Qhs... ~  EGD 6/07 w/ 3cmHH, stricture, gastitis (HPylori neg)... I offered to change her Nexium to a generic but she declined- "DrPatterson told me never to change this med"... she notes occas nocturnal reflux symptoms. ~  5/12: She had GI eval by DrPatterson 5/12> chr GERD, hx 3cmHH, prev stricture dilated, hx colon polyps, etc> c/o incr reflux symptoms, bloating, pressure despite her Nexium40AM & Zantac300PM;  Nexium was increased to Bid, Carafate added Qid & she had EGD 5/12 showing mod gastritis w/ bx= chr inflamm, neg HPylori... ~  CTAbd 5/12 showed mult parenchymal nodules at lung bases (some fractionally larger), calcif hilar nodes bilat, no renal lesions (?left upper pole mass seen on sonar?). ~  EGD 3/13 by DrPatterson showed [place],  esophagitis, prob "pill  espohagitis", dilated... Symptoms resolved w/ Carafate, Nexium, Zantac, Xylocaine... ~  5/14: on Nexium40Bid, Zantac300HS, CarafateQid prn, & Zofran prn; she remains asymptomatic w/o abd pain, n/v, c/d, blood seen... ~  11/15: on Nexium40, Carafate1GmQhs, & Zofran prn; she has seen DrPyrtle & remains asymptomatic on these meds- w/o abd pain, n/v, c/d, blood seen...  IRRITABLE BOWEL SYNDROME (ICD-564.1) COLONIC POLYPS, HX OF (ICD-V12.72)  ~  Colonoscopy 6/07 by DrPatterson showing divertics & polyps (adenomatous)... f/u planned 80yr... ~  She had f/u colonoscopy 5/12 showing mod divertics, sessile polyp in cecum= serrated adenoma w/ f/u planned 3-511yr..  UTI'S, CHRONIC (ICD-599.0) - eval by DrOttelin for urology... now off the Macrodantin and using cranberry juice without recurrent UTI, no problems...  DEGENERATIVE JOINT DISEASE (ICD-715.90) - eval by DrDalldorf w/ mod DJD in knees & hx of torn left meniscus... 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 overall improved now. ~  11/13:  TRAMADOL '50mg'$  Tid refilled for prn use... ~  2015-16[facility] right & left knee DJD, right ankle tendonitis, right plantar fasciitis...  ~  4/16: she had right elbow surg by DrKuzma> mult granulomas on path c/w sarcoid nodule...  LOW BACK PAIN, CHRONIC (ICD-724.2) ~  2/14: she fell while getting up at night & hit her back w/ severe pain & L1 compression fx- [facility] & good pain relief...  OSTEOPENIA (ICD-733.90) - on Calcium, MVI, Vit D... ~  BMD in 2005 showed TScore -1.2 in Spine, & [facility]  BMD 5/09 showed TScores -1.5 in Spine & -1.1 in FemNecks. ~  BMD here 5/11 showed TScores -1.2 in Spine, and [facility]  5/13 & 5/14:  She is due for f/u BMD but wants to wait for now... ~  Labs 11/15 showed VitD= 52... ~  BMD 2/16  revealed Tscore -1.8 in Lspine and Rt FemNeck; we rec FOSAMAX70/wk in light of her prev compression fx...  ANXIETY (ICD-300.00)  Hx of BASAL CELL SKIN CANCER - [place]  Health Maintenance: ~  GI:  followed by DrPatterson/Pyrtle w/ colon as above... ~  GYN:  followed by DrRichardson & doing well by pt's report... [facility] ~  Immunizations:  she gets yearly flu shots;  has PNEUMOVAX in 1998 & repeated 10/11 (age 75  TDAP given 5/13;  we discussed Shingles vaccine (she had bout of shingles involving her right hip & leg ~age30) & she will decide.    Past Surgical History:  Procedure Laterality Date  . ABDOMINAL HYSTERECTOMY    . BASAL CELL CARCINOMA EXCISION    . CATARACT EXTRACTION Bilateral   . COLONOSCOPY  2012  . MASS EXCISION Left 11/02/2014   Procedure: EXCISION MASS LEFT ELBOW;  Surgeon: GaDaryll BrodMD;  [facility];  Laterality: Left;  . POLYPECTOMY    . REMOVAL OF MASS FROM RT ANTECUBITAL FOSSA    . UPPER GASTROINTESTINAL ENDOSCOPY    . VESICOVAGINAL FISTULA CLOSURE W/ TAH      Outpatient Encounter Prescriptions as of 02/22/2016  Medication Sig Dispense Refill  . aspirin 81 MG tablet Take 81 mg by mouth daily.      . B Complex Vitamins (VITAMIN-B COMPLEX PO) Take 1 capsule by mouth daily.     .[name]  Calcium Carbonate (CALTRATE 600 PO) Take 1 tablet by mouth 2 (two) times daily.      . cholecalciferol (VITAMIN D) 1000 units tablet Take 1,000 Units by mouth daily.    [name] diltiazem (CARDIZEM) 30 MG tablet Take 1 tablet (30 mg total) by mouth every 6 (six) hours. For esophageal spasm 60 tablet 2  . Esomeprazole Magnesium (NEXIUM 24HR) 20 MG TBEC Take 1 tablet by mouth 2 (two) times daily.    . Glucosamine HCl (GLUCOSAMINE PO) Liquid form - Take by mouth as directed once daily    . meclizine (ANTIVERT) 25 MG tablet Take 1/2-1 tablet by mouth every 4 hours as needed for dizziness     . Multiple Vitamins-Minerals (MULTIVITAL) tablet Take 1 tablet by mouth daily.      . Omega-3 Fatty Acids (FISH OIL) 1000 MG CAPS Take 1 capsule by mouth daily.      . ondansetron (ZOFRAN) 8 MG tablet 1 tablet by mouth every 6 hours as needed for nausea 25 tablet 5  . predniSONE (DELTASONE) 20 MG tablet Take as directed    . simvastatin (ZOCOR) 40 MG tablet TAKE 1 TABLET BY MOUTH EVERY NIGHT AT BEDTIME 90 tablet 1  . sucralfate (CARAFATE) 1 G tablet Take 1 tablet (1 g total) by mouth 2 (two) times daily. 60 tablet 2   No facility-administered encounter medications on file as of 02/22/2016.     Allergies  Allergen Reactions  . 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 Amoxicillin-Pot Clavulanate     REACTION: causes diarrhea  . Codeine     REACTION: nausea  . Levaquin [Levofloxacin In D5w]     Had itching w/ intravenous, not sure if reaction was from abx or the need to change the IV.  Does well when taken with Benadryl. Pt states can take orally with no issues   . Tdap [Diphth-Acell Pertussis-Tetanus]     Area raised, warm to touch, tender, swollen, pt felt jittery, nausea and some SOB  . Tape Rash    Blisters skin and removes skin --NEEDS PAPER TAPE    Review of Systems         See HPI - all other systems neg except as noted... The patient denies anorexia, fever, weight loss, weight gain, vision loss, decreased hearing, hoarseness, chest pain, syncope, dyspnea on exertion, peripheral edema, prolonged cough, headaches, hemoptysis, abdominal pain, melena, hematochezia, severe indigestion/heartburn, hematuria, incontinence, muscle weakness, suspicious skin lesions, transient blindness, difficulty walking, depression, unusual weight change, abnormal bleeding, enlarged lymph nodes, and angioedema.     Objective:   Physical Exam    WD, WN, 74 y/o WF in NAD... GENERAL:  Alert & oriented; pleasant &  cooperative... HEENT:  [place]/AT, EOM-wnl, PERRLA, EACs-clear, TMs-wnl, NOSE-clear, THROAT-clear & wnl. NECK:  Supple w/ fairROM; no JVD; normal carotid impulses w/o bruits; no thyromegaly or nodules palpated; no lymphadenopathy. CHEST:  Clear to P & A; without wheezes/ rales/ or rhonchi. HEART:  Regular Rhythm; without murmurs/ rubs/ or gallops. ABDOMEN:  Soft & nontender; normal bowel sounds; no organomegaly or masses detected. EXT: without deformities, mild arthritic changes; no varicose veins/ +venous insuffic/ no edema. NEURO:  CN's intact;  no focal neuro deficits... DERM:  No lesions noted; no rash etc...  RADIOLOGY DATA:  Reviewed in the EPIC EMR & discussed w/ the patient...  LABORATORY DATA:  Reviewed in the  EPIC EMR & discussed w/ the patient...   Assessment & Plan:    DYSPNEA w/ thorough PULM & CARDIAC eval>> underlying sarcoidosis w/ mild pulm fibrosis & prev no signs of dis activity- on watchful waiting protocol; Cards eval by DrTaylor was neg as well x PVCs & PACs; she has improved on a gradual exercise program, avoids caffeine & stress...  SARCOID>  Previous CXR/ PFT/ ACE stable & she remains asymptomatic, nodule left elbow excised & path revealed nonnecrotizing granulomata... ~  12/2015>  FluA illness 09/2015 w/ CXR via ER showed some progression of the BHA & parenchymal abnormalities; CT Chest 12/2015 confirmed & ACE=62; We decided to start Rx w/ PRED 40-30-20 Q2wk taper w/ ROV/ in 6wks. ~  02/2016>  Clinically ?sl better w/ a few Pred side effects, ACE improved to 23 & we decided to cut the Pred to 20-10 Qod til ret 6wks...  CHOL>  Stable on the Simva40 + diet...  GERD>  On Prilosec & Zantac, she is currently improved from her prev symptoms...  DJD/ LBP/ Osteopenia>  As noted she remains stable on current meds & exercise program; she will see DrDaldorf about her ankle pain; BMD 2/16 w/ Tscore -1.8 & rec to start [place]...  Compression fx L1 after fall> she is s/p L1  kyphoplasty...  Other medical issues as noted...   Patient's Medications  New Prescriptions      Previous Medications   ASPIRIN 81 MG TABLET    Take 81 mg by mouth daily.     B COMPLEX VITAMINS (VITAMIN-B COMPLEX PO)    Take 1 capsule by mouth daily.    CALCIUM CARBONATE (CALTRATE 600 PO)    Take 1 tablet by mouth 2 (two) times daily.     DILTIAZEM (CARDIZEM) 30 MG TABLET    Take 1 tablet (30 mg total) by mouth every 6 (six) hours. For esophageal spasm   ESOMEPRAZOLE MAGNESIUM (NEXIUM 24HR) 20 MG TBEC    Take 1 tablet by mouth 2 (two) times daily.   GLUCOSAMINE HCL (GLUCOSAMINE PO)    Liquid form - Take by mouth as directed once daily   MECLIZINE (ANTIVERT) 25 MG TABLET    Take 1/2-1 tablet by mouth every 4 hours as needed for dizziness   MULTIPLE VITAMINS-MINERALS (MULTIVITAL) TABLET    Take 1 tablet by mouth daily.     OMEGA-3 FATTY ACIDS (FISH OIL) 1000 MG CAPS    Take 1 capsule by mouth daily.     ONDANSETRON (ZOFRAN) 8 MG TABLET    1 tablet by mouth every 6 hours as needed for nausea                   PREDNISONE '20mg'$  Tabs Take 1 tab daily for 2wks, then decr to 1tab alt w/ 1/2 tab Qod til return visit   SIMVASTATIN (ZOCOR) 40 MG TABLET    TAKE 1 TABLET BY MOUTH EVERY NIGHT AT BEDTIME   SUCRALFATE (CARAFATE) 1 G TABLET    Take 1 tablet (1 g total) by mouth 2 (two) times daily.  Modified Medications   No medications on file  Discontinued Medications   ALENDRONATE (FOSAMAX) 70 MG TABLET    Take 1 tablet (70 mg total) by mouth once a week. Take with a full glass of water on an empty stomach.   ALENDRONATE SODIUM (BINOSTO) 70 MG TBEF    Take 1 tablet by mouth once a week.   BENZONATATE (TESSALON) 200 MG CAPSULE    Take  1 capsule (200 mg total) by mouth 3 (three) times daily as needed for cough.   CIPROFLOXACIN (CIPRO) 250 MG TABLET    Take 1 tablet (250 mg total) by mouth 2 (two) times daily.

## 2016-02-22 NOTE — Patient Instructions (Signed)
Today we updated your med list in our EPIC system...    Continue your current medications the same...  We decided to continue the Prednisone 20mg  tabs at one tab each AM thru 02/04/16,,,    Then decrease the dose to 20mg  alternating w/ 10mg  (1/2 tab) every other day (ie- 20, 10, 20, 10, etc) til return visit in about 6 weeks time (mid Sept)...  Call for any questions...   Today we did a follow up ACE level (Sarcoid blood test)...    We will contact you w/ the results when available.Marland KitchenMarland Kitchen

## 2016-02-23 LAB — ANGIOTENSIN CONVERTING ENZYME: Angiotensin-Converting Enzyme: 23 U/L (ref 8–52)

## 2016-02-27 ENCOUNTER — Telehealth: Payer: Self-pay | Admitting: Pulmonary Disease

## 2016-02-27 DIAGNOSIS — R7309 Other abnormal glucose: Secondary | ICD-10-CM

## 2016-02-27 NOTE — Telephone Encounter (Signed)
Per SN---  No sweets Low carb diet Increase water intake Come to the lab for bmp and a1c.   Called and spoke with pt and she is aware of SN recs.  Order for labs has been placed and pt is aware and will come in the morning.  Nothing further is needed.

## 2016-02-27 NOTE — Telephone Encounter (Signed)
Called and spoke to pt. Pt states [place] Izora Gala, came to house today and performed a urine screen and it was negative for protein but was 3+ for glucose. Izora Gala, [facility] NP, states she believes the capillary glucose would be above 200mg /dl, however a CBG was not checked. Pt states she started taking the pred on 6/18 at 40mg  daily then 7/2 at 30mg  daily then on 7/16 started on 20mg  daily and has continued at this dose. Pt concerned and is requesting recs today, pt states she is [facility] and could come by for a CBG check if needed.  Dr. Lenna Gilford, please advise. Thanks.   Allergies  Allergen Reactions  . 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 Amoxicillin-Pot Clavulanate     REACTION: causes diarrhea  . Codeine     REACTION: nausea  . Levaquin [Levofloxacin In D5w]     Had itching w/ intravenous, not sure if reaction was from abx or the need to change the IV.  Does well when taken with Benadryl. Pt states can take orally with no issues   . Tdap [Diphth-Acell Pertussis-Tetanus]     Area raised, warm to touch, tender, swollen, pt felt jittery, nausea and some SOB  . Tape Rash    Blisters skin and removes skin --NEEDS PAPER TAPE    Current Outpatient Prescriptions on File Prior to Visit  Medication Sig Dispense Refill  . aspirin 81 MG tablet Take 81 mg by mouth daily.      . B Complex Vitamins (VITAMIN-B COMPLEX PO) Take 1 capsule by mouth daily.     . Calcium Carbonate (CALTRATE 600 PO) Take 1 tablet by mouth 2 (two) times daily.      . cholecalciferol (VITAMIN D) 1000 units tablet Take 1,000 Units by mouth daily.    [name] diltiazem (CARDIZEM) 30 MG tablet Take 1 tablet (30 mg total) by mouth every 6 (six) hours. For esophageal spasm 60 tablet 2  . Esomeprazole Magnesium (NEXIUM 24HR) 20 MG TBEC Take 1 tablet by mouth 2 (two) times daily.    . Glucosamine HCl (GLUCOSAMINE PO) Liquid form  - Take by mouth as directed once daily    . meclizine (ANTIVERT) 25 MG tablet Take 1/2-1 tablet by mouth every 4 hours as needed for dizziness    . Multiple Vitamins-Minerals (MULTIVITAL) tablet Take 1 tablet by mouth daily.      . Omega-3 Fatty Acids (FISH OIL) 1000 MG CAPS Take 1 capsule by mouth daily.      . ondansetron (ZOFRAN) 8 MG tablet 1 tablet by mouth every 6 hours as needed for nausea 25 tablet 5  . predniSONE (DELTASONE) 20 MG tablet Take as directed    . simvastatin (ZOCOR) 40 MG tablet TAKE 1 TABLET BY MOUTH EVERY NIGHT AT BEDTIME 90 tablet 1  . sucralfate (CARAFATE) 1 G tablet Take 1 tablet (1 g total) by mouth 2 (two) times daily. 60 tablet 2   No current facility-administered medications on file prior to visit.

## 2016-02-28 ENCOUNTER — Other Ambulatory Visit (INDEPENDENT_AMBULATORY_CARE_PROVIDER_SITE_OTHER): Payer: Medicare Other

## 2016-02-28 DIAGNOSIS — R7309 Other abnormal glucose: Secondary | ICD-10-CM | POA: Diagnosis not present

## 2016-02-28 LAB — BASIC METABOLIC PANEL
BUN: 16 mg/dL (ref 6–23)
CHLORIDE: 102 meq/L (ref 96–112)
CO2: 31 meq/L (ref 19–32)
Calcium: 9.4 mg/dL (ref 8.4–10.5)
Creatinine, Ser: 0.81 mg/dL (ref 0.40–1.20)
GFR: 73.15 mL/min (ref >60.00)
GLUCOSE: 100 mg/dL — AB (ref 70–99)
POTASSIUM: 3.9 meq/L (ref 3.5–5.1)
SODIUM: 141 meq/L (ref 135–145)

## 2016-02-28 LAB — HEMOGLOBIN A1C: Hgb A1c MFr Bld: 6.7 % — ABNORMAL HIGH (ref 4.6–6.5)

## 2016-02-29 ENCOUNTER — Telehealth: Payer: Self-pay | Admitting: *Deleted

## 2016-02-29 NOTE — Telephone Encounter (Signed)
-----   Message from Noralee Space, MD sent at 02/29/2016  8:30 AM EDT ----- Please notify patient>   Chem panel shows normal FBS at 100, and A1c test is still acceptable at 6.7.Marland KitchenMarland Kitchen If the A1c gets >7.0 then we will start DM meds, but she can avoid that w/ a strict DM diet-- no sugars, no sweets, low carb & work on weight reduction w/ this diet + exercise...  If she wants she can check FBS 2-3d per week at home

## 2016-02-29 NOTE — Telephone Encounter (Signed)
Patient notified of lab results.  She would like information on Diabetic diets mailed to her home address (confirmed address).  She would also like a handwritten Rx for a Glucometer, strips, lancets so she can "bargain hunt" to get the machine and everything at a cheaper rate.   Dr. Lenna Gilford, please advise.

## 2016-02-29 NOTE — Telephone Encounter (Signed)
rx for the meter and supplies and the diabetic diet have been placed in the mail and I have called the pt and she is aware.

## 2016-03-28 ENCOUNTER — Other Ambulatory Visit: Payer: Self-pay | Admitting: Pulmonary Disease

## 2016-04-04 ENCOUNTER — Ambulatory Visit (INDEPENDENT_AMBULATORY_CARE_PROVIDER_SITE_OTHER): Payer: Medicare Other | Admitting: Pulmonary Disease

## 2016-04-04 ENCOUNTER — Ambulatory Visit (INDEPENDENT_AMBULATORY_CARE_PROVIDER_SITE_OTHER)
Admission: RE | Admit: 2016-04-04 | Discharge: 2016-04-04 | Disposition: A | Discharge status: Home / Self Care | Payer: Medicare Other | Source: Ambulatory Visit | Attending: Pulmonary Disease | Admitting: Pulmonary Disease

## 2016-04-04 ENCOUNTER — Encounter: Payer: Self-pay | Admitting: Pulmonary Disease

## 2016-04-04 VITALS — BP 130/78 | HR 66 | Temp 97.7°F | Ht 68.0 in | Wt 170.2 lb

## 2016-04-04 DIAGNOSIS — K219 Gastro-esophageal reflux disease without esophagitis: Secondary | ICD-10-CM | POA: Diagnosis not present

## 2016-04-04 DIAGNOSIS — M159 Polyosteoarthritis, unspecified: Secondary | ICD-10-CM

## 2016-04-04 DIAGNOSIS — E7800 Pure hypercholesterolemia, unspecified: Secondary | ICD-10-CM | POA: Diagnosis not present

## 2016-04-04 DIAGNOSIS — I059 Rheumatic mitral valve disease, unspecified: Secondary | ICD-10-CM | POA: Diagnosis not present

## 2016-04-04 DIAGNOSIS — D869 Sarcoidosis, unspecified: Secondary | ICD-10-CM

## 2016-04-04 DIAGNOSIS — K222 Esophageal obstruction: Secondary | ICD-10-CM

## 2016-04-04 DIAGNOSIS — M150 Primary generalized (osteo)arthritis: Secondary | ICD-10-CM

## 2016-04-04 NOTE — Patient Instructions (Signed)
Today we updated your med list in our EPIC system...    Continue your current medications the same...  Today we rechecked your CXR...    We will contact you w/ the results when available...   I recommend that you continue the PREDNISONE at the current dose (20mg  alternate w/ 10mg  every other day) for another 3 weeks-  through the 1st week of October... Then plan to decrease to 10mg  daily til return OV in December...  Continue your low carb, no sweets diet (look for carbs w/ a low GLYCEMIC INDEX)...    Work on weight reduction & stay as active as possible...  Call for any questions...  Let's plan a follow up visit in early December, sooner if needed for problems.Marland KitchenMarland Kitchen

## 2016-04-05 ENCOUNTER — Telehealth: Payer: Self-pay | Admitting: Pulmonary Disease

## 2016-04-05 MED ORDER — PREDNISONE 20 MG PO TABS: ORAL_TABLET | ORAL | 0 refills | Status: DC

## 2016-04-05 NOTE — Telephone Encounter (Signed)
Called [facility] and they never received Rx for the prednisone. I gave a VO. Pt aware. Nothing further needed

## 2016-04-06 ENCOUNTER — Telehealth: Payer: Self-pay | Admitting: Pulmonary Disease

## 2016-04-06 MED ORDER — CIPROFLOXACIN HCL 250 MG PO TABS: 250.0000 mg | ORAL_TABLET | Freq: Two times a day (BID) | ORAL | 0 refills | Status: DC

## 2016-04-06 NOTE — Telephone Encounter (Signed)
Called spoke with pt. She feels she may have uti. C/o lower back discomfort, feels tired, frequent urination. Denies any foul smell, no blood in urine. Wants something called in. Please advise SN thanks  Allergies  Allergen Reactions  . 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 Amoxicillin-Pot Clavulanate     REACTION: causes diarrhea  . Codeine     REACTION: nausea  . Levaquin [Levofloxacin In D5w]     Had itching w/ intravenous, not sure if reaction was from abx or the need to change the IV.  Does well when taken with Benadryl. Pt states can take orally with no issues   . Tdap [Diphth-Acell Pertussis-Tetanus]     Area raised, warm to touch, tender, swollen, pt felt jittery, nausea and some SOB  . Tape Rash    Blisters skin and removes skin --NEEDS PAPER TAPE     Current Outpatient Prescriptions on File Prior to Visit  Medication Sig Dispense Refill  . aspirin 81 MG tablet Take 81 mg by mouth daily.      . B Complex Vitamins (VITAMIN-B COMPLEX PO) Take 1 capsule by mouth daily.     . Calcium Carbonate (CALTRATE 600 PO) Take 1 tablet by mouth 2 (two) times daily.      . cholecalciferol (VITAMIN D) 1000 units tablet Take 1,000 Units by mouth daily.    . Esomeprazole Magnesium (NEXIUM 24HR) 20 MG TBEC Take 1 tablet by mouth 2 (two) times daily.    . Glucosamine HCl (GLUCOSAMINE PO) Liquid form - Take by mouth as directed once daily    . meclizine (ANTIVERT) 25 MG tablet Take 1/2-1 tablet by mouth every 4 hours as needed for dizziness    . Multiple Vitamins-Minerals (MULTIVITAL) tablet Take 1 tablet by mouth daily.      . Omega-3 Fatty Acids (FISH OIL) 1000 MG CAPS Take 1 capsule by mouth daily.      . ondansetron (ZOFRAN) 8 MG tablet 1 tablet by mouth every 6 hours as needed for nausea 25 tablet 5  . predniSONE (DELTASONE) 20 MG tablet TAKE AS DIRECTED PER PATIENT INSTRUCTIONS 50 tablet 0  .  simvastatin (ZOCOR) 40 MG tablet TAKE 1 TABLET BY MOUTH EVERY NIGHT AT BEDTIME 90 tablet 1  . sucralfate (CARAFATE) 1 G tablet Take 1 tablet (1 g total) by mouth 2 (two) times daily. 60 tablet 2   No current facility-administered medications on file prior to visit.

## 2016-04-06 NOTE — Telephone Encounter (Signed)
Per SN-give Cipro 250 mg #14 take 1 po BID no refills and increase water intake. Pt is aware and Rx has been sent. Nothing more needed at this time.

## 2016-04-15 NOTE — Progress Notes (Signed)
Subjective:    Patient ID: Laura [name], female    DOB: 1941/03/08, 75 y.o.   MRN: [id number]  HPI 75 y/o WF here for a follow up visit... she has prob Sarcoidosis w/ hx Uveitis and BHA on CXR- we are following a "watchful waiting" protocol...  ~  Nov 21, 2011:  37moROV & LGavrielais doing reasonably well w/o new complaints or concerns;     She [facility] an [facility] [place] [facility] she developed an "irritation in my esoph" dyspagia/ odynophagia; EGD by DrPatterson revealed "Pill esophagitis" & she was treated w/ Carafate (off now), Nexium Bid, & Zantac 300Qhs;  improved now on & she will check w/ GI about a formulary PPI med...    We reviewed prob list, meds, xrays, & labs> see below>>   LABS 5/13:  FLP- at goals on Simva40;  Chems- wnl;  CBC- wnl;  TSH=1.61;  VitD=47  ADDENDUM>> she received TDAP w/ local reaction req antihist & topical Rx...  ~  May 23, 2012:  [name] & Laura [name] reports some vertigo while on vacation recently & improved w/ Antivert...    Sarcoid> Hx abn CXR, she is asymptomatic; CXR today= right suprahilar nodule (likely sarcoid scarring) & chr changes...    MVP> on ASA81;  She denies CP, palpit, SOB, etc...     Chol> on Simva40; FLP 5/13 looked ok w/ TChol 159, TG 151, HDL 47, LDL 82; reviewed diet & wt reduction...    GI> GERD, IBS, Polyp> on Nexium40, Zantac300HS, CarafateQid prn, & Zofran prn;     UTIs> she reports no prob on Cranberry juice...    DJD, LBP, Osteop> on calium, MVI, VitD; we refilled Tramadol for prn use...    Anxiety> she has a lot of family support, doesn't require meds... We reviewed prob list, meds, xrays and labs> see below for updates >> OK Flu shot today...  CXR 11/13 showed similar changes- irreg nodular changes in right suprahilar region, scarring, stable adenopathy, mild atherosclerotic calcif, osteopenia...   ~  Dec 02, 2012:  [name] & [name] that in Feb2014 she fell while getting up at night & hit her  back w/ severe pain & L1 compression fx- [facility] & good pain relief; also told to have LLLpneumonia treated w/ Levaquin; she had f/u here by TP 3/14 & improved; since then she is back to normal- had nice [place] vacation, feeling well, etc...  We reviewed the following medical problems during today's office visit >>     Sarcoid> Hx abn CXR, she is asymptomatic; baseline CXR w/ right suprahilar nodule, adenop, scarring & chr changes; ?LLLpneum 09[place]01/08 [place]/ [place]quin...    MVP> on ASA81;  She denies CP, palpit, SOB, etc...     Chol> on Simva40; FLP 5/14 showed TChol 165, TG 157, HDL 41, LDL 93; reviewed diet & wt reduction...    GI> GERD, IBS, Polyp> on Nexium40Bid, Zantac300HS, CarafateQid prn, & Zofran prn; she remains asymptomatic w/o abd pain, n/v, c/d, blood seen...    UTIs> she reports no prob on Cranberry juice...    DJD, LBP, L1 compression w/ kyphoplasty, Osteop> on calium, MVI, VitD; we refilled Tramadol for prn use; she [place] 2[place]14 [place]/ [place]1 [place].    Anxiety> she has a lot of family support, doesn't require meds... We reviewed prob list, meds, xrays and labs>  see below for updates >>   CXR 3/14 showed normal heart size, atherosclerotic Ao, chr scarring from underlying sarcoid, NAD...  LABS 5/14:  FLP- at goals on Simva40;  Chems- wnl;  CBC- wnl;  TSH=1.74;  VitD=52...  ~  June 03, 2013:  [name] & Laura [name] has had some prob w/ pain & swelling in right ankle- she has appt w/ Ortho this afternoon (DrDaldorf)...     Hx prob Sarcoid and abn CXR; she remains asymptomatic & last film 3/14 showed stable scarring, NAD...     She remains on Simva40 for Chol; Labs 5/14 looked ok & we reviewed diet, exercise, & wt reduction strategies...    She has Hx GERD, IBS, colon polyps> on Nexium40Qam, Zantac300Qhs, Carafate 1gmQidPrn & doing fine w/o abd apin, dysphagia, n/v, c/d, blood seen; last colon was  5/12...    She has hx DJD, LBP, L1 compression w/ kyphoplasty; on Tramadol prn, takes calcium, MVI, VitD; last BMD was done here 5/11 w/ TScores -1.2 in Spine and FemNecks... We reviewed prob list, meds, xrays and labs> see below for updates >> she had the 2014 flu vaccine in Oct...   ~  Dec 09, 2013:  614moOV & Laura [name] saw TP several months ago c/o dyspnea and fatigue> she's been sedentary due to foot problem & when she tried to walk she noted trouble w/ hills, no CP, etc; hx sarcoid but she has never required treatment as it has been felt to be inactive- f/u CXR unchanged; felt to likely be deconditioning but referred to Cards for their eval as well...    Seen by DrTaylor for CaTribune CompanyDEcho showed norm LVF, norm valves, no signs of pulmHTN; subdeq stress testing showed extreme dyspnea w/ exercise (poor functional capacity), no ischemia; she has been encouraged to join silver sneakers and incr her exercise program...     Seen by DrPyrtle for GI> c/o epig discomfort, hx GERD/ IBS/ colon polyps; symptoms improved by incr Nexium to Bid & adding Carafate prn; she is up to date on screening...  We reviewed prob list, meds, xrays and labs> see below for updates >>   LABS 1/15:  Chems- ok x K=3.4;  CBC- wnl;  TSH=2.28;  Mag=2.0...Marland KitchenMarland KitchenCXR 1/15 showed norm heart size, areas of scarring appear unchanged, prom hilae w/o change- stable, NAD...Marland KitchenMarland Kitchen EKG 3/15 showed NSR, rate79, wnl, NAD...  Stress Test 3/15 showed poor exercise capacity, no ischemia...  2DEcho 3/15 showed normal LVF w/ EF=55-60%, no regional wall motion abn, normal valves, no evid for pulmHTN...  PFTs 5/15 showed just some sm airways dis> FVC=2.65 (78%), FEV1=1.86 (72%); %1sec=70; mid-flows=55% predicted... After bronchodil her FEV1 improved 6%... Lung Volumes & DLCO are wnl...   LABS 5/15:  ACE level = 55     ~  June 07, 2014:  39m239moV & Laura [name] is improved> she has been exercising at home 2-3d/wk & [facility] 2d[facility]; she notes less dyspnea w/  walking, on hills, etc...  She saw DrPyrtle 6/15- Hx GERD w/ esoph, IBS, adenomatous colon polyps; on Nexium40/d and Carafate 1tmQhs and doing well w/o breakthrough symptoms...  We reviewed the following medical problems during today's office visit >>     Sarcoid> Hx abn CXR, she is asymptomatic; baseline CXR w/ right suprahilar nodule, adenop, scarring & chr changes; ?LLLpneum 09[place]05/72 [place]/ [place]; last CXR 1/15 was stable and last ACE=55 ...    MVP> on ASA81;  She denies CP, palpit, SOB, etc...[name]  Chol> on Simva40 & FishOil; last FLP 11/15 showed TChol 169, TG 135, HDL 41, LDL 101; reviewed diet & wt reduction...    GI> GERD, IBS, Polyp> on Nexium40, Carafate1GmQhs, & Zofran prn; she remains asymptomatic w/o abd pain, n/v, c/d, blood seen...    UTIs> she reports no prob on Cranberry juice...    DJD, LBP, L1 compression w/ kyphoplasty, Osteop> on calium, MVI, VitD & OTC analgesics; she fell 2/14 w/ L1 compression & kyphoplasty in [place]; she is ready for f/u BMD=> pending...    Anxiety> she has a lot of family support, doesn't require meds... We reviewed prob list, meds, xrays and labs> see below for updates >> she had the 2015 Flu shot recently, she is in need of the Prevnar-13 vaccine but wants to wait...   LABS 11/15:  FLP- looks good on Simva40, needs to lose wt;  Chems- wnl;  CBC- wnl;  TSH=2.19;  VitD=52...  ~  Dec 06, 2014:  1moRKaltaghad left elbow surg for a nodule & excision by DMelrose Nakayamashowed path=numerous nonnecrotizing granuloma, spec stains neg & this is c/w an unusual manifestation of her Sarcoid; she was told "scar tissue" and is improved now... We reviewed the following medical problems during today's office visit >>     Sarcoid> Hx abn CXR, she is asymptomatic; baseline CXR w/ right suprahilar nodule, adenop, scarring & chr changes; ?LLLpneum 25/46[place]/ [place]; last CXR 1/15 was stable and last ACE=55; she had nodule excised from left elbow  4/16 by DrKuzma=> granulomatous nodule c/w unusual manifewstation of Sarcoid, all spec stains were neg...    HxMVP, palpit w/ PVCs & PACs on Holter> on ASA81;  She denies CP, palpit, SOB now; advised no caffeine & avoid stress!    Chol> on Simva40 & FishOil; last FLP 11/15 showed TChol 169, TG 135, HDL 41, LDL 101; reviewed diet & wt reduction...    GI> GERD, IBS, Polyp> on Prilosec40Bid, Carafate1GmQhs, & Zofran prn; followed by DrJacobs- she remains asymptomatic w/o abd pain, n/v, c/d, blood seen...    UTIs> she reports no prob on Cranberry juice but had one recurrent UTI 4/16- Cipro cqalled in & symptoms resolved....    DJD, LBP, L1 compression w/ kyphoplasty, Osteop> on calcium, MVI, VitD & Tramadol50; she [place] 2[place]14 [place] w/ Tscore-1.8 & we rec ALENDRONATE70/wk...    Anxiety> she has a lot of family support, doesn't require meds... We reviewed prob list, meds, xrays and labs> see below for updates >>   CXR 5/16 showed stable BHA & scarring on right, NAD, no change...  LABS 5/16:  ACE=56, stable...  ~  June 08, 2015:  697moOSte. Genevievead f/u DrPyrtle recently- GERD, esoph spasm, IBS, colon polyps & +FamHx colon ca in sister> Rx w/ Nexium20Bid, Carafate prn, Cardizem30 prn esoph spasm and sheis improved ;  She also had f/u Cards DrTaylor 12/23/14- palpit w/ PVCs and PACs, improved and rec to f/u prn... Note that she is off prev Fosamax Rx- took it for 70m2mostopped due to reflux- offered Binoso but she prefers off Rx (BMD 2/16- Tscore -1.8) & she takes Ca & VitD supplement... Prob list as above> EXAM shows Afeb, VSS, O2sat=95% on RA;  HEENT- neg, Mallampati1;  Chest- clear w/o w/r/r;  Heart- RR w/o m/r/g;  Abd- soft, nontender, neg;  Ext- neg w/o c/c/e;  Neuro- intact w/o focal abn...  LABS 06/07/16>  FLP-  all parameters at goals on Simva40;  Chems- wnl;  CBC- wnl;  TSH=1.86;  VitD=52;  ACE=63;   IMP/PLAN>>  Dianna is stable on current regimen-  continue same meds and f/u in 9mo..  ~  December 26, 2015:  6-732moOMeridianemains asymptomatic, feeling well, and denies CP/ palpit. Cough/ phlegm/ hemoptysis, SOB, edema, etc;  She tells me that she got the Flu at the end of March noting HA, sore throat, severe cough w/o much sput, low grade temp, aching & sore, etc & was seen in the ER where CXR showed bilat hilar adenopathy & increased curvilinear thickening on the right but no acute process;  Flu panel was POS for TypeA flu;  She had f/u OV w/ TP shortly thereafter & given ZPak, Tessalon, Mucinex, Delsym, fluids;  She notes that it took her 8wks to fully resolve & get her energy back!  We reviewed the following medical problems during today's office visit >>     Ophthalmology> Hx uveitis, [facility]    Sarcoid> Hx uveitis & abn CXR w/ BHA, she is asymptomatic; baseline CXR w/ adenop, scarring & chr changes; hx ?LL[place]neum 2[place]/59[place] w/ [place]; last CXR 5/16 was stable and last ACE=56; she had nodule excised from left elbow 4/16 by DrKuzma=> granulomatous nodule c/w unusual manifestation of Sarcoid, all spec stains were neg...    HxMVP, palpit w/ PVCs & PACs on Holter> on ASA81;  She denies CP, palpit, SOB now; advised no caffeine & avoid stress!    Chol> on Simva40 & FishOil; last FLP 11/15 showed TChol 169, TG 135, HDL 41, LDL 101; reviewed diet & wt reduction...    GI> GERD, IBS, Polyp> on Prilosec40Bid, Carafate1GmQhs, & Zofran prn; followed by DrPyrtle- she remains asymptomatic w/o abd pain, n/v, c/d, blood seen...       ADDENDUM> she had colonoscopy 01/17/16> + for divertics, hemorrhoids, and several polyps- 4 to 63m65mize & Path= sessile serrated adenomas & f/u suggested 3 yrs.    UTIs> she reports no prob on Cranberry juice but had one recurrent UTI 4/16- Cipro cqalled in & symptoms resolved....    DJD, LBP, L1 compression w/ kyphoplasty, Osteop> on calcium, MVI, VitD & Tramadol50; she fell 2/14 w/ L1  compression [place] w/ Tscore-1.8 & we rec ALENDRONATE70/wk...    Anxiety> she has a lot of family support, doesn't require meds... EXAM shows Afeb, VSS, O2sat=97% on RA;  HEENT- neg, Mallampati1;  Chest- clear w/o w/r/r;  Heart- RR w/o m/r/g;  Abd- soft, nontender, neg;  Ext- neg w/o c/c/e;  Neuro- intact w/o focal abn...  CXR 10/16/15> I have reviewed this film & compared to prev images>  bilat hilar adenopathy & increased opac in left superior hilum and right lat hilar region w/ extension into ant RUL (similar to 11/2014 films but progressed from previous CXRs) => we will proceed w/ f/u CT Chest...  LABS 10/16/15>  Flu panel showed POS FluA;  Neg FluB and H1N1...   LABS 12/2015>  ACE=62 (nl=8-52);  VitD=51;  BMet- ok x BS=131, Cr=0.74, Ca=9.4... Marland KitchenMarland KitchenT Chest w/ contrast 01/06/16>  Norm heart size, aortic & coronary atherosclerosis, part calcif mediastinal adenopathy (unchanged from 2009), progression of bilat hilar adenopathy & pulm parenchymal changes w/ mass-like density in RML~4cm, and mass-like density in ant LUL ~4x2cm -- radiology feels this is c/w sarcoid progression.   Full PFTs 01/06/16>  FVC=2.66 (80%), FEV1=1.82 (72%), %1sec=68,  mid-flows rediced at 53% predicted; post bronchodil FEV1 improved 5% to 1.92;  TLC=5.57 (98%), RV=2.72 (108%), RV/TLC=49%;  DLCO=70% pred (DL/VA=96%). IMP/PLAN>>  Concern for active sarcoidosis w/ the recent CXR and CT changes; she has never had a biopsy due to remote presentation w/ BHA and uveitis and the fact that she has been devoid of resp symptoms, and prev had neg ACE levels (ie- no signs of dis activity);  Her recent CXR changes (progression), sl elev ACE in the low 60s, suggests some activity yet she remains stoic & asymptomatic;  She would like to proceed w/o lung Bx if poss & I discussed trial PREDNISONE w/ short term f/u to check ACE & CXR (if not improved then we will address need for lung bx at that time);  REC to start Pred40/d  x2wks, 30/d x2wks, then 20/d til return in about 6wks time...   ~  February 22, 2016:  9moROV & f/u sarcoid> In Jun2017 we did CTChest showing norm heart size, aortic & coronary atherosclerosis, part calcif mediastinal adenopathy (unchanged from 2009), progression of bilat hilar adenopathy & pulm parenchymal changes w/ mass-like density in RML~4cm, and mass-like density in ant LUL ~4x2cm -- radiology feels this is c/w sarcoid progression;  ACE level= 62;  We discussed poss Bx but elected to start Pred therapy w/ 40-30-20 Q2wks and she returns today on Pred20/d noting various symptoms related to the Pred she feels eg- Ha, sweating, trembling, incr HR, & short tempered; these symptoms have diminished w/ decr in the dose; she also notes SOB, can't get a DB, & denies CP- but she has stepped up her walking program & doing better now;  We discussed checking ACE level & weaning Pred further based on this result...    EXAM shows Afeb, VSS, O2sat=97% on RA;  HEENT- neg, Mallampati1;  Chest- clear w/o w/r/r;  Heart- RR w/o m/r/g;  Abd- soft, nontender, neg;  Ext- neg w/o c/c/e;  Neuro- intact w/o focal abn...  LABS 02/2016>  BMet- ok w/ BS=100, A1c= 6.7, Cr=0.81, K=3.9;  ACE=23... IMP/PLAN>>  LBettyseems somewhat improved on the Pred & ACE is down to 23;  I suggest that she keep the Pred20/d for another 2wks then cut it to 244malt w/ 1035mod til return in 6 wks time...  ~  April 04, 2016:  6wk ROV & f/u sarcoid>  She reports stable on the Pred 20-10 Qod for the past month & doing better on the lower doses, BS better & wt stable at 170#- we reviewed low carb, low sodium diet;  She denies CP, SOB, cough, sput, hemoptysis, etc...    EXAM shows Afeb, VSS, O2sat=97% on RA;  HEENT- neg, Mallampati1;  Chest- clear w/o w/r/r;  Heart- RR w/o m/r/g;  Abd- soft, nontender, neg;  Ext- neg w/o c/c/e;  Neuro- intact w/o focal abn...  CXR 04/04/16>  Stable heart size, mild atherosclerosis of Ao, BHA & perihilar opacities sl  decreased from prev films, no new airspace dis... IMP/PLAN>>  We decided to continue the Pred 69m76mt w/ 10mg31m thru Oct & then decr to 10mg/82marting in Nov until her f/u visit in 63mo w/34mo CXR & Labs...          Problem List:  Hx of UVEITIS (ICD-364.3) [facility] given [facility] injection in [facility]... s/p right cataract surg 2/09 and no active uveitis seen... ~  4/11:  she tells me she was seen 10/10 & doing well,  may need left cataract surg soon. ~  Ophthalmology notes from [facility] are reviewed:  DrDickinson noted hx uveitis- steroid responder, s/p bilat cat surg w/ lens replacement, epiretinal membrane & post vitreous detachments...   R/O SARCOIDOSIS (ICD-135) - Hx uveitis w/ eval at [facility], and subseq f/u CXR w/ CTChest showing bilat hilar adenopathy, and w/u showing neg PPD, ACE=53, sed=13... prev PFT's 1/08 showed FVC 3.75 (111%), FEV1 2.74 (102%), %1sec=73, mid-flows=70%... being followed w/ serial films and "watchful waiting"... there is a family hx of sarcoidosis in one brother (severe dis w/ neuropathy). ~   we had a f/u CXR in Feb09- streaky opacity right mid lung zone, similar to 2008...  ~  CT Chest 7/08 & 6/09 showed mediastinal > hilar adenopathy some w/ calcif (generally smaller than 1/08), area of atx/ scarring right mid zone (infer RUL) & scat <1cm nodules in lower zones w/o change... ~  CXR 2/10 showed bilat hilar prominence & scarring appears the same- NAD... labs all WNL w/ ACE=56. ~  CXR 4/11 showed mild bilat hilar prominence, scarring, NAD- osteopenia & DJD in TSpine... ACE= 37. ~  CXR 4/12 showed stable bilat hilar prominence, scarring, NAD.[name]. ~  CXR 11/13 showed similar changes- irreg nodular changes in right suprahilar region, scarring, stable adenopathy, mild atherosclerotic calcif, osteopenia... ~  CXR 3/14 showed normal heart size, atherosclerotic Ao, chr scarring from underlying sarcoid, NAD.[name]. ~  CXR 1/15 showed norm heart size, areas of scarring  appear unchanged, prom hilae w/o change- stable, NAD ~  PFTs 5/15 showed just some sm airways dis> FVC=2.65 (78%), FEV1=1.86 (72%); %1sec=70; mid-flows=55% predicted... After bronchodil her FEV1 improved 6%... Lung Volumes & DLCO are wnl. ~  LABS 5/15:  ACE level = 55  ~  4/16: she had nodule excised from left elbow 4/16 by DrKuzma=> granulomatous nodule c/w unusual manifewstation of Sarcoid, all spec stains were neg... ~  5/16: CXR 5/16 showed stable BHA & scarring on right, NAD, no change... LABS 5/16:  ACE=56, stable. ~  2017>  +FluA illness 09/2015 w/ ER eval & CXR showing progressive abn;  CT Chest 12/2015 confirmed progression of BHA & LUL/RML parenchymal densities felt to be c/w active Sarcoid, ACE=62;  Pt wanted to avoid Bx if poss & we decided on a course of Pred 40-30-20 Q2wk taper w/ ROV in 6wks for f/u=> ACE improved to 23 and we weaned Pred further to 23m alt w/ 17mQod til return...   PALPITATIONS >>  Hx MITRAL VALVE PROLAPSE >> on ASA 8193m... clinical dx w/ 2DEcho 1999 showing flat closure but no frank prolapse... ~  2/16: she had a cardiac eval by DrTaylor for dyspnea> 2DEcho was neg, wnl; stress testing was neg- w/o ischemia; noted to have poor functional capacity & is way too sedentary; she had some palpit & monitor revealed PVCs & PACs- didn't want meds therefore asked to avoid caffeine 7 stress...  HYPERCHOLESTEROLEMIA (ICD-272.0) - on SIMVASTATIN 9m32mnow... ~  FLP [place]8 on Lip10 showed TChol 183, TG 122, HDL 45, LDL 114...  ~  FLP [place]9 on Lip10 showed TChol 183, TG 189, HDL 39, LDL 107... continue diet & change to Simva40.  ~  FLP 5/09 on Simva40 showed TChol 185, TG 163, HDL 36, LDL 117... she wants to stay on Simva40. ~  FLP 2/10 showed TChol 176, Tg 160, HDL 39, LDL 105... rec> continue same. ~  FLP 4/11 showed TChol 160, TG 131, HDL 41, LDL 93 ~  FLP 4/12 showed TChol 174,  TG 148, HDL 43, LDL 102 ~  FLP 5/13 showed TChol 159, TG 151, HDL 47, LDL 82 ~  FLP 5/14 on  Simva40 showed TChol 165, TG 157, HDL 41, LDL 93 ~  FLP 11/15 on Simva40 showed TChol 169, TG 135, HDL 41, LDL 101  GERD (ICD-530.81) - on NEXIUM 38m/d & ZANTAC 3058mhs... ~  EGD 6/07 w/ 3cmHH, stricture, gastitis (HPylori neg)... I offered to change her Nexium to a generic but she declined- "DrPatterson told me never to change this med"... she notes occas nocturnal reflux symptoms. ~  5/12: She had GI eval by DrPatterson 5/12> chr GERD, hx 3cmHH, prev stricture dilated, hx colon polyps, etc> c/o incr reflux symptoms, bloating, pressure despite her Nexium40AM & Zantac300PM;  Nexium was increased to Bid, Carafate added Qid & she had EGD 5/12 showing mod gastritis w/ bx= chr inflamm, neg HPylori... ~  CTAbd 5/12 showed mult parenchymal nodules at lung bases (some fractionally larger), calcif hilar nodes bilat, no renal lesions (?left upper pole mass seen on sonar?). ~  EGD 3/13 by DrPatterson showed 3cmHH, esophagitis, prob "pill espohagitis", dilated... Symptoms resolved w/ Carafate, Nexium, Zantac, Xylocaine... ~  5/14: on Nexium40Bid, Zantac300HS, CarafateQid prn, & Zofran prn; she remains asymptomatic w/o abd pain, n/v, c/d, blood seen... ~  11/15: on Nexium40, Carafate1GmQhs, & Zofran prn; she has seen DrPyrtle & remains asymptomatic on these meds- w/o abd pain, n/v, c/d, blood seen...  IRRITABLE BOWEL SYNDROME (ICD-564.1) COLONIC POLYPS, HX OF (ICD-V12.72)  ~  Colonoscopy 6/07 by DrPatterson showing divertics & polyps (adenomatous)... f/u planned 5y28yr. ~  She had f/u colonoscopy 5/12 showing mod divertics, sessile polyp in cecum= serrated adenoma w/ f/u planned 3-37yr8yr  UTI'S, CHRONIC (ICD-599.0) - eval by DrOttelin for urology... now off the Macrodantin and using cranberry juice without recurrent UTI, no problems...  DEGENERATIVE JOINT DISEASE (ICD-715.90) - eval by DrDalldorf w/ mod DJD in knees & hx of torn left meniscus... she had an inflamm mass resected from her right antecubital  fossa in 2000 by DrSypher (it was a rheumatoid type synovial infiltrate)... overall improved now. ~  11/13:  TRAMADOL 50mg22m refilled for prn use... ~  2015-16: [facility]> right & left knee DJD, right ankle tendonitis, right plantar fasciitis...  ~  4/16: she had right elbow surg by DrKuzma> mult granulomas on path c/w sarcoid nodule...  LOW BACK PAIN, CHRONIC (ICD-724.2) ~  2/14: she fell while getting up at night & hit her back w/ severe pain & L1 compression fx- [facility] & good pain relief...  OSTEOPENIA (ICD-733.90) - on Calcium, MVI, Vit D... ~  BMD in 2005 showed TScore -1.2 in Spine, & -0.7 in [facility] 5/09 showed TScores -1.5 in Spine & -1.1 in FemNecks. ~  BMD here 5/11 showed TScores -1.2 in Spine, and [facility] & 5/14:  She is due for f/u BMD but wants to wait for now... ~  Labs 11/15 showed VitD= 52... ~  BMD 2/16 revealed Tscore -1.8 in Lspine and Rt FemNeck; we rec FOSAMAX70/wk in light of her prev compression fx...  ANXIETY (ICD-300.00)  Hx of BASAL CELL SKIN CANCER - [place]...  Health Maintenance: ~  GI:  followed by DrPatterson/Pyrtle w/ colon as above... ~  GYN:  followed by DrRichardson & doing well by pt's report... [place] [facility] [place]  Immunizations:  she gets yearly flu shots;  has PNEUMOVAX in 1998 & repeated 10/11 (age 69);  TDAP given 5/13;  we discussed Shingles vaccine (she had bout of shingles involving her right hip & leg ~age30) & she will decide.    Past Surgical History:  Procedure Laterality Date  . ABDOMINAL HYSTERECTOMY    . BASAL CELL CARCINOMA EXCISION    . CATARACT EXTRACTION Bilateral   . COLONOSCOPY  2012  . MASS EXCISION Left 11/02/2014   Procedure: EXCISION MASS LEFT ELBOW;  Surgeon: Daryll Brod, MD;  Location: [place];  Service: Orthopedics;  Laterality: Left;  . POLYPECTOMY    .  REMOVAL OF MASS FROM RT ANTECUBITAL FOSSA    . UPPER GASTROINTESTINAL ENDOSCOPY    . VESICOVAGINAL FISTULA CLOSURE W/ TAH      Outpatient Encounter Prescriptions as of 04/04/2016  Medication Sig Dispense Refill  . aspirin 81 MG tablet Take 81 mg by mouth daily.      . B Complex Vitamins (VITAMIN-B COMPLEX PO) Take 1 capsule by mouth daily.     . Calcium Carbonate (CALTRATE 600 PO) Take 1 tablet by mouth 2 (two) times daily.      . cholecalciferol (VITAMIN D) 1000 units tablet Take 1,000 Units by mouth daily.    . Esomeprazole Magnesium (NEXIUM 24HR) 20 MG TBEC Take 1 tablet by mouth 2 (two) times daily.    . Glucosamine HCl (GLUCOSAMINE PO) Liquid form - Take by mouth as directed once daily    . meclizine (ANTIVERT) 25 MG tablet Take 1/2-1 tablet by mouth every 4 hours as needed for dizziness    . Multiple Vitamins-Minerals (MULTIVITAL) tablet Take 1 tablet by mouth daily.      . Omega-3 Fatty Acids (FISH OIL) 1000 MG CAPS Take 1 capsule by mouth daily.      . ondansetron (ZOFRAN) 8 MG tablet 1 tablet by mouth every 6 hours as needed for nausea 25 tablet 5  . simvastatin (ZOCOR) 40 MG tablet TAKE 1 TABLET BY MOUTH EVERY NIGHT AT BEDTIME 90 tablet 1  . sucralfate (CARAFATE) 1 G tablet Take 1 tablet (1 g total) by mouth 2 (two) times daily. 60 tablet 2  . predniSONE (DELTASONE) 20 MG tablet TAKE AS DIRECTED PER PATIENT INSTRUCTIONS 50 tablet 0  . [DISCONTINUED] diltiazem (CARDIZEM) 30 MG tablet Take 1 tablet (30 mg total) by mouth every 6 (six) hours. For esophageal spasm (Patient not taking: Reported on 04/04/2016) 60 tablet 2   No facility-administered encounter medications on file as of 04/04/2016.     Allergies  Allergen Reactions  . 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 Amoxicillin-Pot Clavulanate     REACTION: causes diarrhea  . Codeine     REACTION: nausea  . Levaquin [Levofloxacin In D5w]      Had itching w/ intravenous, not sure if reaction was from abx or the need to change the IV.  Does well when taken with Benadryl. Pt states can take orally with no issues   . Tdap [Diphth-Acell Pertussis-Tetanus]     Area raised, warm to touch, tender, swollen, pt felt jittery, nausea and some SOB  . Tape Rash    Blisters skin and removes skin --NEEDS PAPER TAPE    Review of Systems         See HPI - all other systems neg  except as noted... The patient denies anorexia, fever, weight loss, weight gain, vision loss, decreased hearing, hoarseness, chest pain, syncope, dyspnea on exertion, peripheral edema, prolonged cough, headaches, hemoptysis, abdominal pain, melena, hematochezia, severe indigestion/heartburn, hematuria, incontinence, muscle weakness, suspicious skin lesions, transient blindness, difficulty walking, depression, unusual weight change, abnormal bleeding, enlarged lymph nodes, and angioedema.     Objective:   Physical Exam    WD, WN, 75 y/o WF in NAD... GENERAL:  Alert & oriented; pleasant & cooperative... HEENT:  [place]/AT, EOM-wnl, PERRLA, EACs-clear, TMs-wnl, NOSE-clear, THROAT-clear & wnl. NECK:  Supple w/ fairROM; no JVD; normal carotid impulses w/o bruits; no thyromegaly or nodules palpated; no lymphadenopathy. CHEST:  Clear to P & A; without wheezes/ rales/ or rhonchi. HEART:  Regular Rhythm; without murmurs/ rubs/ or gallops. ABDOMEN:  Soft & nontender; normal bowel sounds; no organomegaly or masses detected. EXT: without deformities, mild arthritic changes; no varicose veins/ +venous insuffic/ no edema. NEURO:  CN's intact;  no focal neuro deficits... DERM:  No lesions noted; no rash etc...  RADIOLOGY DATA:  Reviewed in the EPIC EMR & discussed w/ the patient...  LABORATORY DATA:  Reviewed in the EPIC EMR & discussed w/ the patient...   Assessment & Plan:    DYSPNEA w/ thorough PULM & CARDIAC eval>> underlying sarcoidosis w/ mild pulm fibrosis & prev no signs of  dis activity- on watchful waiting protocol; Cards eval by DrTaylor was neg as well x PVCs & PACs; she has improved on a gradual exercise program, avoids caffeine & stress...  SARCOID>  Previous CXR/ PFT/ ACE stable & she remains asymptomatic, nodule left elbow excised & path revealed nonnecrotizing granulomata... ~  12/2015>  FluA illness 09/2015 w/ CXR via ER showed some progression of the BHA & parenchymal abnormalities; CT Chest 12/2015 confirmed & ACE=62; We decided to start Rx w/ PRED 40-30-20 Q2wk taper w/ ROV/ in 6wks. ~  02/2016>  Clinically ?sl better w/ a few Pred side effects, ACE improved to 23 & we decided to cut the Pred to 20-10 Qod til ret 6wks... ~  03/2016>  CXR shows sl improvement on the Pred 20-10Qod;  We decided to continue this dose thru Oct & decr to 86m/d starting in Nov w/ ROV in 334mo CHOL>  Stable on the Simva40 + diet...  GERD>  On Prilosec & Zantac, she is currently improved from her prev symptoms...  DJD/ LBP/ Osteopenia>  As noted she remains stable on current meds & exercise program; she will see DrDaldorf about her ankle pain; BMD 2/16 w/ Tscore -1.8 & rec to start [place].  Compression fx L1 after fall> she is s/p L1 kyphoplasty...  Other medical issues as noted...   Patient's Medications  New Prescriptions   CIPROFLOXACIN (CIPRO) 250 MG TABLET    Take 1 tablet (250 mg total) by mouth 2 (two) times daily.  Previous Medications   ASPIRIN 81 MG TABLET    Take 81 mg by mouth daily.     B COMPLEX VITAMINS (VITAMIN-B COMPLEX PO)    Take 1 capsule by mouth daily.    CALCIUM CARBONATE (CALTRATE 600 PO)    Take 1 tablet by mouth 2 (two) times daily.     CHOLECALCIFEROL (VITAMIN D) 1000 UNITS TABLET    Take 1,000 Units by mouth daily.   ESOMEPRAZOLE MAGNESIUM (NEXIUM 24HR) 20 MG TBEC    Take 1 tablet by mouth 2 (two) times daily.   GLUCOSAMINE HCL (GLUCOSAMINE PO)    Liquid form - Take  by mouth as directed once daily   MECLIZINE (ANTIVERT) 25 MG TABLET    Take  1/2-1 tablet by mouth every 4 hours as needed for dizziness   MULTIPLE VITAMINS-MINERALS (MULTIVITAL) TABLET    Take 1 tablet by mouth daily.     OMEGA-3 FATTY ACIDS (FISH OIL) 1000 MG CAPS    Take 1 capsule by mouth daily.     ONDANSETRON (ZOFRAN) 8 MG TABLET    1 tablet by mouth every 6 hours as needed for nausea   SIMVASTATIN (ZOCOR) 40 MG TABLET    TAKE 1 TABLET BY MOUTH EVERY NIGHT AT BEDTIME   SUCRALFATE (CARAFATE) 1 G TABLET    Take 1 tablet (1 g total) by mouth 2 (two) times daily.  Modified Medications   Modified Medication Previous Medication   PREDNISONE (DELTASONE) 20 MG TABLET predniSONE (DELTASONE) 20 MG tablet      TAKE AS DIRECTED PER PATIENT INSTRUCTIONS    TAKE AS DIRECTED PER PATIENT INSTRUCTIONS  Discontinued Medications   DILTIAZEM (CARDIZEM) 30 MG TABLET    Take 1 tablet (30 mg total) by mouth every 6 (six) hours. For esophageal spasm

## 2016-04-27 ENCOUNTER — Telehealth: Payer: Self-pay | Admitting: Pulmonary Disease

## 2016-04-27 NOTE — Telephone Encounter (Signed)
Pt called and spoke with pt and she is asking if she should have a flu vaccine this year and if so, which one should she get?  SN please advise. thanks

## 2016-05-01 ENCOUNTER — Other Ambulatory Visit: Payer: Self-pay | Admitting: Pulmonary Disease

## 2016-05-01 NOTE — Telephone Encounter (Signed)
Spoke with pt and advised of Dr Jeannine Kitten recommendations.  Pt verbalized understanding.  Nothing further needed.

## 2016-05-01 NOTE — Telephone Encounter (Signed)
Per SN---  recs for the older adult flu shot. (this is also called the high-dose) due to better response in older adults above 65.

## 2016-05-24 ENCOUNTER — Ambulatory Visit: Payer: Medicare Other | Admitting: Adult Health

## 2016-06-26 ENCOUNTER — Ambulatory Visit (INDEPENDENT_AMBULATORY_CARE_PROVIDER_SITE_OTHER): Payer: Medicare Other | Admitting: Pulmonary Disease

## 2016-06-26 ENCOUNTER — Other Ambulatory Visit (INDEPENDENT_AMBULATORY_CARE_PROVIDER_SITE_OTHER): Payer: Medicare Other

## 2016-06-26 DIAGNOSIS — K222 Esophageal obstruction: Secondary | ICD-10-CM

## 2016-06-26 DIAGNOSIS — D869 Sarcoidosis, unspecified: Secondary | ICD-10-CM | POA: Diagnosis not present

## 2016-06-26 DIAGNOSIS — E7800 Pure hypercholesterolemia, unspecified: Secondary | ICD-10-CM

## 2016-06-26 DIAGNOSIS — K219 Gastro-esophageal reflux disease without esophagitis: Secondary | ICD-10-CM

## 2016-06-26 DIAGNOSIS — M159 Polyosteoarthritis, unspecified: Secondary | ICD-10-CM

## 2016-06-26 DIAGNOSIS — I059 Rheumatic mitral valve disease, unspecified: Secondary | ICD-10-CM

## 2016-06-26 DIAGNOSIS — M150 Primary generalized (osteo)arthritis: Secondary | ICD-10-CM

## 2016-06-26 LAB — BASIC METABOLIC PANEL
BUN: 14 mg/dL (ref 6–23)
CALCIUM: 9.5 mg/dL (ref 8.4–10.5)
CHLORIDE: 102 meq/L (ref 96–112)
CO2: 31 meq/L (ref 19–32)
CREATININE: 0.8 mg/dL (ref 0.40–1.20)
GFR: 74.14 mL/min (ref >60.00)
GLUCOSE: 117 mg/dL — AB (ref 70–99)
Potassium: 3.8 mEq/L (ref 3.5–5.1)
Sodium: 143 mEq/L (ref 135–145)

## 2016-06-26 MED ORDER — METHYLPREDNISOLONE 8 MG PO TABS: ORAL_TABLET | ORAL | 0 refills | Status: DC

## 2016-06-26 MED ORDER — ALPRAZOLAM 0.25 MG PO TABS: ORAL_TABLET | ORAL | 5 refills | Status: DC

## 2016-06-26 NOTE — Patient Instructions (Signed)
Today we updated your med list in our EPIC system...    Continue your current medications the same...  Today we checked your follow up blood work...    We will contact you w/ the results when available...   We decided to change your PREDNISONE to MEDROL 8mg  - take one tab each AM for December...    Starting in Jan2018-- decrease slightly to 8mg  (1tab) one day & 4mg  (1/2 tab) the next - alternate 8,4,8, 4, etc  We wrote for ALPRAZOLAM 0.25mg  tabs-- take 1/2 to 1 tab three times daily...  Stay as active as poss...  Call for any questions or if we can be of service in any way...  Let's plan a follow up visit in 2-16months, sooner if needed for problems.Marland KitchenMarland Kitchen

## 2016-06-27 ENCOUNTER — Encounter: Payer: Self-pay | Admitting: Pulmonary Disease

## 2016-06-27 LAB — ANGIOTENSIN CONVERTING ENZYME: Angiotensin-Converting Enzyme: 28 U/L (ref 9–67)

## 2016-06-27 NOTE — Progress Notes (Signed)
Subjective:    Patient ID: Laura [name], female    DOB: 05-05-1941, 75 y.o.   MRN: [id number]  HPI 75 y/o WF here for a follow up visit... she has prob Sarcoidosis w/ hx Uveitis and BHA on CXR- we are following a "watchful waiting" protocol...   ~  SEE PREV EPIC NOTES FOR OLDER DATA >>   LABS 5/13:  FLP- at goals on Simva40;  Chems- wnl;  CBC- wnl;  TSH=1.61;  VitD=47  ADDENDUM>> she received TDAP w/ local reaction req antihist & topical Rx...  CXR 11/13 showed similar changes- irreg nodular changes in right suprahilar region, scarring, stable adenopathy, mild atherosclerotic calcif, osteopenia...  ~  Laura [name] reports that in Feb2014 she fell while getting up at night & hit her back w/ severe pain & L1 compression fx- adm to [facility] for 2d w/ kyphoplasty & told to have LLLpneumonia treated w/ Levaquin.   CXR 3/14 showed normal heart size, atherosclerotic Ao, chr scarring from underlying sarcoid, NAD...  LABS 5/14:  FLP- at goals on Simva40;  Chems- wnl;  CBC- wnl;  TSH=1.74;  VitD=52...  ~  Dec 09, 2013:  22moROV & Laura [name] saw TP several months ago c/o dyspnea and fatigue> she's been sedentary due to foot problem & when she tried to walk she noted trouble w/ hills, no CP, etc; hx sarcoid but she has never required treatment as it has been felt to be inactive- f/u CXR unchanged; felt to likely be deconditioning but referred to Cards for their eval as well...    Seen by DrTaylor for [employer]rib[employer]2DEcho showed norm LVF, norm valves, no signs of pulmHTN; subdeq stress testing showed extreme dyspnea w/ exercise (poor functional capacity), no ischemia; she has been encouraged to join silver sneakers and incr her exercise program...     Seen by DrPyrtle for GI> c/o epig discomfort, hx GERD/ IBS/ colon polyps; symptoms improved by incr Nexium to Bid & adding Carafate prn; she is up to date on screening...  We reviewed prob list, meds, xrays and labs> see below for updates >>   LABS 1/15:  Chems-  ok x K=3.4;  CBC- wnl;  TSH=2.28;  Mag=2.0..Marland KitchenMarland Kitchen CXR 1/15 showed norm heart size, areas of scarring appear unchanged, prom hilae w/o change- stable, NAD..Marland KitchenMarland Kitchen  EKG 3/15 showed NSR, rate79, wnl, NAD...  Stress Test 3/15 showed poor exercise capacity, no ischemia...  2DEcho 3/15 showed normal LVF w/ EF=55-60%, no regional wall motion abn, normal valves, no evid for pulmHTN...  PFTs 5/15 showed just some sm airways dis> FVC=2.65 (78%), FEV1=1.86 (72%); %1sec=70; mid-flows=55% predicted... After bronchodil her FEV1 improved 6%... Lung Volumes & DLCO are wnl...   LABS 5/15:  ACE level = 55     ~  June 07, 2014:  62moOV & Laura [name] is improved> she has been exercising at home 2-3d/wk & [facility] 2d[facility]; she notes less dyspnea w/ walking, on hills, etc...  She saw DrPyrtle 6/15- Hx GERD w/ esoph, IBS, adenomatous colon polyps; on Nexium40/d and Carafate 1tmQhs and doing well w/o breakthrough symptoms...  We reviewed the following medical problems during today's office visit >>     Sarcoid> Hx abn CXR, she is asymptomatic; baseline CXR w/ right suprahilar nodule, adenop, scarring & chr changes; ?LLLpneum 2/0/86n [place]/ [place]quin; last CXR 1/15 was stable and last ACE=55 ...    MVP> on ASA81;  She denies CP, palpit, SOB, etc; 2DEcho 3/15 was neg...Marland KitchenMarland Kitchen  Chol> on Simva40 & FishOil; last FLP 11/15 showed TChol 169, TG 135, HDL 41, LDL 101; reviewed diet & wt reduction...    GI> GERD, IBS, Polyp> on Nexium40, Carafate1GmQhs, & Zofran prn; she remains asymptomatic w/o abd pain, n/v, c/d, blood seen...    UTIs> she reports no prob on Cranberry juice...    DJD, LBP, L1 compression w/ kyphoplasty, Osteop> on calium, MVI, VitD & OTC analgesics; she fell 2/14 w/ L1 compression & kyphoplasty in [place]; she is ready for f/u BMD=> pending...    Anxiety> she has a lot of family support, doesn't require meds... We reviewed prob list, meds, xrays and labs> see below for updates >> she had the 2015 Flu shot  recently, she is in need of the Prevnar-13 vaccine but wants to wait...   LABS 11/15:  FLP- looks good on Simva40, needs to lose wt;  Chems- wnl;  CBC- wnl;  TSH=2.19;  VitD=52...  ~  Dec 06, 2014:  40moRClevelandhad left elbow surg for a nodule & excision by DMelrose Nakayamashowed path=numerous nonnecrotizing granulomas, spec stains neg & this is c/w an unusual manifestation of her Sarcoid; she was told "scar tissue" and is improved now... We reviewed the following medical problems during today's office visit >>     Sarcoid> Hx abn CXR, she is asymptomatic; baseline CXR w/ right suprahilar nodule, adenop, scarring & chr changes; ?LL[place]pneum 20/16[place]/ Lev[place]; last CXR 1/15 was stable and last ACE=55; she had nodule excised from left elbow 4/16 by DrKuzma=> granulomatous nodule c/w unusual manifewstation of Sarcoid, all spec stains were neg...    HxMVP, palpit w/ PVCs & PACs on Holter> on ASA81;  She denies CP, palpit, SOB now; advised no caffeine & avoid stress!    Chol> on Simva40 & FishOil; last FLP 11/15 showed TChol 169, TG 135, HDL 41, LDL 101; reviewed diet & wt reduction...    GI> GERD, IBS, Polyp> on Prilosec40Bid, Carafate1GmQhs, & Zofran prn; followed by DrJacobs- she remains asymptomatic w/o abd pain, n/v, c/d, blood seen...    UTIs> she reports no prob on Cranberry juice but had one recurrent UTI 4/16- Cipro cqalled in & symptoms resolved....    DJD, LBP, L1 compression w/ kyphoplasty, Osteop> on calcium, MVI, VitD & Tramadol50; she fell 2/14 w/ L1 compression [place] w/ Tscore-1.8 & we rec ALENDRONATE70/wk...    Anxiety> she has a lot of family support, doesn't require meds... We reviewed prob list, meds, xrays and labs> see below for updates >>   CXR 5/16 showed stable BHA & scarring on right, NAD, no change...  LABS 5/16:  ACE=56, stable...  ~  June 08, 2015:  [place] DrPyrtle recently- GERD, esoph spasm, IBS, colon polyps &  +FamHx colon ca in sister> Rx w/ Nexium20Bid, Carafate prn, Cardizem30 prn esoph spasm and she is improved ;  She also had f/u Cards DrTaylor 12/23/14- palpit w/ PVCs and PACs, improved and rec to f/u prn... Note that she is off prev Fosamax Rx- took it for 70m68mostopped due to reflux- offered Binoso but she prefers off Rx (BMD 2/16- Tscore -1.8) & she takes Ca & VitD supplement... Prob list as above> EXAM shows Afeb, VSS, O2sat=95% on RA;  HEENT- neg, Mallampati1;  Chest- clear w/o w/r/r;  Heart- RR w/o m/r/g;  Abd- soft, nontender, neg;  Ext- neg w/o c/c/e;  Neuro- intact w/o focal abn...  LABS 06/07/16>  FLP- all parameters at goals on Simva40;  Chems- wnl;  CBC- wnl;  TSH=1.86;  VitD=52;  ACE=63   IMP/PLAN>>  Laura [name] is stable on current regimen- continue same meds and f/u in 81mo..  ~  December 26, 2015:  6[place]emains asymptomatic, feeling well, and denies CP/ palpit. Cough/ phlegm/ hemoptysis, SOB, edema, etc;  She tells me that she got the Flu at the end of March noting HA, sore throat, severe cough w/o much sput, low grade temp, aching & sore, etc & was seen in the ER where CXR showed bilat hilar adenopathy & increased curvilinear thickening on the right but no acute process;  Flu panel was POS for TypeA flu;  She had f/u OV w/ TP shortly thereafter & given ZPak, Tessalon, Mucinex, Delsym, fluids;  She notes that it took her 8wks to fully resolve & get her energy back!  We reviewed the following medical problems during today's office visit >>     Ophthalmology> Hx uveitis, s/p bilat cat surg, [facility]    Sarcoid> Hx uveitis & abn CXR w/ BHA, she is asymptomatic; baseline CXR w/ adenop, scarring & chr changes; hx ?LLLpneum 2/1/47n [place] w/ Lev[place]quin; last CXR 5/16 was stable and last ACE=63; she had nodule excised from left elbow 4/16 by DrKuzma=> granulomatous nodule c/w unusual manifestation of Sarcoid, all spec stains were neg...    HxMVP, palpit w/ PVCs & PACs on Holter> on  ASA81;  She denies CP, palpit, SOB now; advised no caffeine & avoid stress!    Chol> on Simva40 & FishOil; last FLP 11/15 showed TChol 169, TG 135, HDL 41, LDL 101; reviewed diet & wt reduction...    GI> GERD, IBS, Polyp> on Prilosec40Bid, Carafate1GmQhs, & Zofran prn; followed by DrPyrtle- she remains asymptomatic w/o abd pain, n/v, c/d, blood seen...       ADDENDUM> she had colonoscopy 01/17/16> + for divertics, hemorrhoids, and several polyps- 4 to 66m28mize & Path= sessile serrated adenomas & f/u suggested 3 yrs.    UTIs> she reports no prob on Cranberry juice but had one recurrent UTI 4/16- Cipro cqalled in & symptoms resolved....    DJD, LBP, L1 compression w/ kyphoplasty, Osteop> on calcium, MVI, VitD & Tramadol50; she fell 2/14 w/ L[place] w/ Tscore-1.8 & we rec ALENDRONATE70/wk...    Anxiety> she has a lot of family support, doesn't require meds... EXAM shows Afeb, VSS, O2sat=97% on RA;  HEENT- neg, Mallampati1;  Chest- clear w/o w/r/r;  Heart- RR w/o m/r/g;  Abd- soft, nontender, neg;  Ext- neg w/o c/c/e;  Neuro- intact w/o focal abn...  CXR 10/16/15> I have reviewed this film & compared to prev images>  bilat hilar adenopathy & increased opac in left superior hilum and right lat hilar region w/ extension into ant RUL (similar to 11/2014 films but progressed from previous CXRs) => we will proceed w/ f/u CT Chest...  LABS 10/16/15>  Flu panel showed POS FluA;  Neg FluB and H1N1...   LABS 12/2015>  ACE=62 (nl=8-52);  VitD=51;  BMet- ok x BS=131, Cr=0.74, Ca=9.4... Marland KitchenMarland KitchenT Chest w/ contrast 01/06/16>  Norm heart size, aortic & coronary atherosclerosis, part calcif mediastinal adenopathy (unchanged from 2009), progression of bilat hilar adenopathy & pulm parenchymal changes w/ mass-like density in RML~4cm, and mass-like density in ant LUL ~4x2cm -- radiology feels this is c/w sarcoid progression.   Full PFTs 01/06/16>  FVC=2.66 (80%), FEV1=1.82 (72%), %1sec=68,  mid-flows rediced at 53% predicted; post bronchodil FEV1 improved 5% to 1.92;  TLC=5.57 (98%), RV=2.72 (108%), RV/TLC=49%;  DLCO=70% pred (DL/VA=96%). IMP/PLAN>>  Concern for active sarcoidosis w/ the recent CXR and CT changes; she has never had a biopsy due to remote presentation w/ BHA and uveitis and the fact that she has been devoid of resp symptoms, and prev had neg ACE levels (ie- no signs of dis activity);  Her recent CXR changes (progression), sl elev ACE in the low 60s, suggests some activity yet she remains stoic & asymptomatic;  She would like to proceed w/o lung Bx if poss & I discussed trial PREDNISONE w/ short term f/u to check ACE & CXR (if not improved then we will address need for lung bx at that time);  REC to start Pred40/d x2wks, 30/d x2wks, then 20/d til return in about 6wks time...   ~  February 22, 2016:  56moROV & f/u sarcoid> In Jun2017 we did CTChest showing norm heart size, aortic & coronary atherosclerosis, part calcif mediastinal adenopathy (unchanged from 2009), progression of bilat hilar adenopathy & pulm parenchymal changes w/ mass-like density in RML~4cm, and mass-like density in ant LUL ~4x2cm -- radiology feels this is c/w sarcoid progression;  ACE level= 62;  We discussed poss Bx but elected to start Pred therapy w/ 40-30-20 Q2wks and she returns today on Pred20/d noting various symptoms related to the Pred she feels eg- Ha, sweating, trembling, incr HR, & short tempered; these symptoms have diminished w/ decr in the dose; she also notes SOB, can't get a DB, & denies CP- but she has stepped up her walking program & doing better now;  We discussed checking ACE level & weaning Pred further based on this result...    EXAM shows Afeb, VSS, O2sat=97% on RA;  HEENT- neg, Mallampati1;  Chest- clear w/o w/r/r;  Heart- RR w/o m/r/g;  Abd- soft, nontender, neg;  Ext- neg w/o c/c/e;  Neuro- intact w/o focal abn...  LABS 02/2016>  BMet- ok w/ BS=100, A1c= 6.7, Cr=0.81, K=3.9;   ACE=23... IMP/PLAN>>  LFinlayseems somewhat improved on the Pred & ACE is down to 23;  I suggest that she keep the Pred20/d for another 2wks then cut it to 216malt w/ 1037mod til return in 6 wks time...  ~  April 04, 2016:  6wk ROV & f/u sarcoid>  She reports stable on the Pred 20-10 Qod for the past month & doing better on the lower doses, BS better & wt stable at 170#- we reviewed low carb, low sodium diet;  She denies CP, SOB, cough, sput, hemoptysis, etc...    EXAM shows Afeb, VSS, O2sat=97% on RA;  HEENT- neg, Mallampati1;  Chest- clear w/o w/r/r;  Heart- RR w/o m/r/g;  Abd- soft, nontender, neg;  Ext- neg w/o c/c/e;  Neuro- intact w/o focal abn...  CXR 04/04/16>  Stable heart size, mild atherosclerosis of Ao, BHA & perihilar opacities sl decreased from prev films, no new airspace dis... IMP/PLAN>>  We decided to continue the Pred 80m9mt w/ 10mg61m thru Oct & then decr to 10mg/66marting in Nov until her f/u visit in 14mo w/34mo CXR & Labs...   ~  June 26, 2016:  14mo ROV46moois retDillon on Pred 10mg/d f61mhe last month- she is still noting mult somatic complaints and blames the Pred=> we discussed change to MEDROL 8mg tabs 18msee if she tolerates this better; she tells me she's been exercising 3d/wk  at the gym & doing satis, no cough/ sputum, and dyspnea improving; she notes occas palpit "fluttering" and more aware of heart beat- I have tried to get her to take the Alpraz0.51m tabs at 1/2 to1 tab Tid more regularly; we will recheck labs today, & she will need f/u CT Chest in early 2018 (166yr/u scan)...    EXAM shows Afeb, VSS, O2sat=97% on RA;  HEENT- neg, Mallampati1;  Chest- clear w/o w/r/r;  Heart- RR w/o m/r/g;  Abd- soft, nontender, neg;  Ext- neg w/o c/c/e;  Neuro- intact w/o focal abn...  LABS 06/26/16> Chems- ok x BS=117;  ACE=28 IMP/PLAN>>  LoLugeneerceives some difficulty w/ the Pred but she is improved overall & ACE down to 28; we decided to change to MEDROL 32m50mabs takingone  Qam for Dec & cut to 32mg47mternating w/ 4mg 36m starting Jan2018=> we will recheck her in Feb w/ f/u CTChest due and also due for 66yr r60yrck BMD... Note: >50% of this 15min 732mt was spent in counseling and coordination of care.          Problem List:  Hx of UVEITIS (ICD-364.3) [id number], given [facility] injection in 2002... s/p right cataract surg 2/09 and no active uveitis seen... ~  4/11:  she tells me she was seen 10/10 & doing well, may need left cataract surg soon. ~  [facility]  DrDickinson noted hx uveitis- steroid responder, s/p bilat cat surg w/ lens replacement, epiretinal membrane & post vitreous detachments...   R/O SARCOIDOSIS (ICD-135) - [facility]/ CTChest showing bilat hilar adenopathy, and w/u showing neg PPD, ACE=53, sed=13... prev PFT's 1/08 showed FVC 3.75 (111%), FEV1 2.74 (102%), %1sec=73, mid-flows=70%... being followed w/ serial films and "watchful waiting"... there is a family hx of sarcoidosis in one brother (severe dis w/ neuropathy). ~   we had a f/u CXR in Feb09- streaky opacity right mid lung zone, similar to 2008...  ~  CT Chest 7/08 & 6/09 showed mediastinal > hilar adenopathy some w/ calcif (generally smaller than 1/08), area of atx/ scarring right mid zone (infer RUL) & scat <1cm nodules in lower zones w/o change... ~  CXR 2/10 showed bilat hilar prominence & scarring appears the same- NAD... labs all WNL w/ ACE=56. ~  CXR 4/11 showed mild bilat hilar prominence, scarring, NAD- osteopenia & DJD in TSpine... ACE= 37. ~  CXR 4/12 showed stable bilat hilar prominence, scarring, NAD... ~  CMarland KitchenR 11/13 showed similar changes- irreg nodular changes in right suprahilar region, scarring, stable adenopathy, mild atherosclerotic calcif, osteopenia... ~  CXR 3/14 showed normal heart size, atherosclerotic Ao, chr scarring from underlying sarcoid, NAD... ~  CMarland KitchenR 1/15 showed norm heart size,  areas of scarring appear unchanged, prom hilae w/o change- stable, NAD ~  PFTs 5/15 showed just some sm airways dis> FVC=2.65 (78%), FEV1=1.86 (72%); %1sec=70; mid-flows=55% predicted... After bronchodil her FEV1 improved 6%... Lung Volumes & DLCO are wnl. ~  LABS 5/15:  ACE level = 55  ~  4/16: she had nodule excised from left elbow 4/16 by DrKuzma=> granulomatous nodule c/w unusual manifewstation of Sarcoid, all spec stains were neg... ~  5/16: CXR 5/16 showed stable BHA & scarring on right, NAD, no change... LABS 5/16:  ACE=56, stable. ~  2017>  +FluA illness 09/2015 w/ ER eval & CXR showing progressive abn;  CT Chest 12/2015 confirmed progression of BHA & LUL/RML  parenchymal densities felt to be c/w active Sarcoid, ACE=62;  Pt wanted to avoid Bx if poss & we decided on a course of Pred 40-30-20 Q2wk taper w/ ROV in 6wks for f/u=> ACE improved to 23 and we weaned Pred slowly... ~  12/17: Pt perceives several issues w2/ the Pred (now down to 52m/d) so we will switch to Medrol836mtabs and continue slow weaning.. Marland KitchenPALPITATIONS >>  Hx MITRAL VALVE PROLAPSE >> on ASA 8164m... clinical dx w/ 2DEcho 1999 showing flat closure but no frank prolapse... ~  2/16: she had a cardiac eval by DrTaylor for dyspnea> 2DEcho was neg, wnl; stress testing was neg- w/o ischemia; noted to have poor functional capacity & is way too sedentary; she had some palpit & monitor revealed PVCs & PACs- didn't want meds therefore asked to avoid caffeine 7 stress...  HYPERCHOLESTEROLEMIA (ICD-272.0) - on SIMVASTATIN 65m32mnow... ~  FLP [place] on Lip10 showed TChol 183, TG 122, HDL 45, LDL 114...  ~  FLP [place]9 on Lip10 showed TChol 183, TG 189, HDL 39, LDL 107... continue diet & change to Simva40.  ~  FLP 5/09 on Simva40 showed TChol 185, TG 163, HDL 36, LDL 117... she wants to stay on Simva40. ~  FLP 2/10 showed TChol 176, Tg 160, HDL 39, LDL 105... rec> continue same. ~  FLP 4/11 showed TChol 160, TG 131, HDL 41, LDL 93 ~  FLP 4/12  showed TChol 174, TG 148, HDL 43, LDL 102 ~  FLP 5/13 showed TChol 159, TG 151, HDL 47, LDL 82 ~  FLP 5/14 on Simva40 showed TChol 165, TG 157, HDL 41, LDL 93 ~  FLP 11/15 on Simva40 showed TChol 169, TG 135, HDL 41, LDL 101 ~  FLP 11/16 on Simva40 showed TChol 161, TG 146, HDL 44, LDL 87  GERD (ICD-530.81) - on NEXIUM 65mg7m ZANTAC 300mgQ57m. ~  EGD 6/07 w/ 3cmHH, stricture, gastitis (HPylori neg)... I offered to change her Nexium to a generic but she declined- "DrPatterson told me never to change this med"... she notes occas nocturnal reflux symptoms. ~  5/12: She had GI eval by DrPatterson 5/12> chr GERD, hx 3cmHH, prev stricture dilated, hx colon polyps, etc> c/o incr reflux symptoms, bloating, pressure despite her Nexium40AM & Zantac300PM;  Nexium was increased to Bid, Carafate added Qid & she had EGD 5/12 showing mod gastritis w/ bx= chr inflamm, neg HPylori... ~  CTAbd 5/12 showed mult parenchymal nodules at lung bases (some fractionally larger), calcif hilar nodes bilat, no renal lesions (?left upper pole mass seen on sonar?). ~  EGD 3/13 by DrPatterson showed 3cmHH, esophagitis, prob "pill espohagitis", dilated... Symptoms resolved w/ Carafate, Nexium, Zantac, Xylocaine... ~  5/14: on Nexium40Bid, Zantac300HS, CarafateQid prn, & Zofran prn; she remains asymptomatic w/o abd pain, n/v, c/d, blood seen... ~  11/15: on Nexium40, Carafate1GmQhs, & Zofran prn; she has seen DrPyrtle & remains asymptomatic on these meds- w/o abd pain, n/v, c/d, blood seen...  IRRITABLE BOWEL SYNDROME (ICD-564.1) COLONIC POLYPS, HX OF (ICD-V12.72)  ~  Colonoscopy 6/07 by DrPatterson showing divertics & polyps (adenomatous)... f/u planned 31yrs..62yr She had f/u colonoscopy 5/12 showing mod divertics, sessile polyp in cecum= serrated adenoma w/ f/u planned 3-31yrs...41yrI'S, CHRONIC (ICD-599.0) - eval by DrOttelin for urology... now off the Macrodantin and using cranberry juice without recurrent UTI, no  problems...  DEGENERATIVE JOINT DISEASE (ICD-715.90) - eval by DrDalldorf w/ mod DJD in knees & hx of torn left meniscus... she had  an inflamm mass resected from her right antecubital fossa in 2000 by DrSypher (it was a rheumatoid type synovial infiltrate)... overall improved now. ~  11/13:  TRAMADOL 81m Tid refilled for prn use... ~  2015-16[facility] right & left knee DJD, right ankle tendonitis, right plantar fasciitis...  ~  4/16: she had right elbow surg by DrKuzma> mult granulomas on path c/w sarcoid nodule...  LOW BACK PAIN, CHRONIC (ICD-724.2) ~  2/14: she fell while getting up at night & hit her back w/ severe pain & L1 compression fx- [facility] & good pain relief...  OSTEOPENIA (ICD-733.90) - on Calcium, MVI, Vit D... ~  BMD in 2005 showed TScore -1.2 in Spine, & -0.7 in [facility] ~  BMD 5/09 showed TScores -1.5 in Spine & -1.1 in FemNecks. ~  BMD here 5/11 showed TScores -1.2 in Spine, and [facility] ~  5/13 & 5/14:  She is due for f/u BMD but wants to wait for now... ~  Labs 11/15 showed VitD= 52... ~  BMD 2/16 revealed Tscore -1.8 in Lspine and Rt FemNeck; we rec FOSAMAX70/wk in light of her prev compression fx=> pt stopped the Bisphos after several months due to reflux symptms and refuses alternative med... ~  She is rec to take Calcium, MVI, VitD supplement, & wt bearing exercise...  ANXIETY (ICD-300.00) >> given ALPRAZOLAM 0.251mtabs w/ directions to try 1/2 to 1 tab Tid more regularly...  Hx of BASAL CELL SKIN CANCER - [place]...  Health Maintenance: ~  GI:  followed by DrPatterson/Pyrtle w/ colon as above... ~  GYN:  followed by DrRichardson & doing well by pt's report... [facility] ~  Immunizations:  she gets yearly flu shots;  has PNEUMOVAX in 1998 & repeated 10/11 (age 75  TDAP given 5/13;  we discussed Shingles vaccine (she had bout  of shingles involving her right hip & leg ~age30) & she will decide.    Past Surgical History:  Procedure Laterality Date  . ABDOMINAL HYSTERECTOMY    . BASAL CELL CARCINOMA EXCISION    . CATARACT EXTRACTION Bilateral   . COLONOSCOPY  2012  . MASS EXCISION Left 11/02/2014   Procedure: EXCISION MASS LEFT ELBOW;  Surgeon: GaDaryll BrodMD;  [place] [facility] [place];  Laterality: Left;  . POLYPECTOMY    . REMOVAL OF MASS FROM RT ANTECUBITAL FOSSA    . UPPER GASTROINTESTINAL ENDOSCOPY    . VESICOVAGINAL FISTULA CLOSURE W/ TAH      Outpatient Encounter Prescriptions as of 06/26/2016  Medication Sig  . aspirin 81 MG tablet Take 81 mg by mouth daily.    . B Complex Vitamins (VITAMIN-B COMPLEX PO) Take 1 capsule by mouth daily.   . Calcium Carbonate (CALTRATE 600 PO) Take 1 tablet by mouth 2 (two) times daily.    . cholecalciferol (VITAMIN D) 1000 units tablet Take 1,000 Units by mouth daily.  . Esomeprazole Magnesium (NEXIUM 24HR) 20 MG TBEC Take 1 tablet by mouth 2 (two) times daily.  . Glucosamine HCl (GLUCOSAMINE PO) Liquid form - Take by mouth as directed once daily  . meclizine (ANTIVERT) 25 MG tablet Take 1/2-1 tablet by mouth every 4 hours as needed for dizziness  . Multiple Vitamins-Minerals (MULTIVITAL) tablet Take 1 tablet by mouth daily.    . Omega-3 Fatty Acids (FISH OIL) 1000 MG CAPS Take  1 capsule by mouth daily.    . ondansetron (ZOFRAN) 8 MG tablet 1 tablet by mouth every 6 hours as needed for nausea  . predniSONE (DELTASONE) 20 MG tablet TAKE AS DIRECTED PER PATIENT INSTRUCTIONS  . simvastatin (ZOCOR) 40 MG tablet TAKE 1 TABLET BY MOUTH EVERY NIGHT AT BEDTIME  . sucralfate (CARAFATE) 1 G tablet Take 1 tablet (1 g total) by mouth 2 (two) times daily.  [name] ALPRAZolam (XANAX) 0.25 MG tablet Take 1/2 to 1 tablet by mouth three times daily  . methylPREDNISolone (MEDROL) 8 MG tablet Take 1 tablet by mouth every morning  . [DISCONTINUED] ciprofloxacin  (CIPRO) 250 MG tablet Take 1 tablet (250 mg total) by mouth 2 (two) times daily. (Patient not taking: Reported on 06/26/2016)   No facility-administered encounter medications on file as of 06/26/2016.     Allergies  Allergen Reactions  . 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 Amoxicillin-Pot Clavulanate     REACTION: causes diarrhea  . Codeine     REACTION: nausea  . Levaquin [Levofloxacin In D5w]     Had itching w/ intravenous, not sure if reaction was from abx or the need to change the IV.  Does well when taken with Benadryl. Pt states can take orally with no issues   . Tdap [Diphth-Acell Pertussis-Tetanus]     Area raised, warm to touch, tender, swollen, pt felt jittery, nausea and some SOB  . Tape Rash    Blisters skin and removes skin --NEEDS PAPER TAPE    Review of Systems         See HPI - all other systems neg except as noted... The patient denies anorexia, fever, weight loss, weight gain, vision loss, decreased hearing, hoarseness, chest pain, syncope, dyspnea on exertion, peripheral edema, prolonged cough, headaches, hemoptysis, abdominal pain, melena, hematochezia, severe indigestion/heartburn, hematuria, incontinence, muscle weakness, suspicious skin lesions, transient blindness, difficulty walking, depression, unusual weight change, abnormal bleeding, enlarged lymph nodes, and angioedema.     Objective:   Physical Exam    WD, WN, 75 y/o WF in NAD... GENERAL:  Alert & oriented; pleasant & cooperative... HEENT:  [place]/AT, EOM-wnl, PERRLA, EACs-clear, TMs-wnl, NOSE-clear, THROAT-clear & wnl. NECK:  Supple w/ fairROM; no JVD; normal carotid impulses w/o bruits; no thyromegaly or nodules palpated; no lymphadenopathy. CHEST:  Clear to P & A; without wheezes/ rales/ or rhonchi. HEART:  Regular Rhythm; without murmurs/ rubs/ or gallops. ABDOMEN:  Soft & nontender; normal bowel sounds; no  organomegaly or masses detected. EXT: without deformities, mild arthritic changes; no varicose veins/ +venous insuffic/ no edema. NEURO:  CN's intact;  no focal neuro deficits... DERM:  No lesions noted; no rash etc...  RADIOLOGY DATA:  Reviewed in the EPIC EMR & discussed w/ the patient...  LABORATORY DATA:  Reviewed in the EPIC EMR & discussed w/ the patient...   Assessment & Plan:    DYSPNEA w/ thorough PULM & CARDIAC eval>> underlying sarcoidosis w/ mild pulm fibrosis & prev no signs of dis activity- on watchful waiting protocol; Cards eval by DrTaylor was neg as well x PVCs & PACs; she has improved on a gradual exercise program, avoids caffeine & stress...  SARCOID>  Previous CXR/ PFT/ ACE stable & she remains asymptomatic, nodule left elbow excised & path revealed nonnecrotizing granulomata... ~  12/2015>  FluA illness 09/2015 w/ CXR via ER showed some progression  of the [place] & parenchymal abnormalities; CT Chest 12/2015 confirmed & ACE=62; We decided to start Rx w/ PRED 40-30-20 Q2wk taper w/ ROV/ in 6wks. ~  02/2016>  Clinically ?sl better w/ a few Pred side effects, ACE improved to 23 & we decided to cut the Pred to 20-10 Qod til ret 6wks... ~  03/2016>  CXR shows sl improvement on the Pred 20-10Qod;  We decided to continue this dose thru Oct & decr to 54m/d starting in Nov w/ ROV in 321mo~  06/2016>  Laura [name] some difficulty w/ the Pred but she is improved overall & ACE down to 28; we decided to change to MEDROL 32m80mabs takingone Qam for Dec & cut to 32mg77mternating w/ 4mg 47m starting Jan2018=> we will recheck her in Feb w/ f/u CTChest due and also due for 109yr r46yrck BMD  CHOL>  Stable on the Simva40 + diet...  GERD>  On Prilosec & Zantac, she is currently improved from her prev symptoms...  DJD/ LBP/ Osteopenia>  As noted she remains stable on current meds & exercise program; she will see DrDaldorf about her ankle pain; BMD 2/16 w/ Tscore -1.8 & rec to start  [place] after [place]> she is s/p L1 kyphoplasty...  Other medical issues as noted...   Patient's Medications  New Prescriptions   ALPRAZOLAM (XANAX) 0.25 MG TABLET    Take 1/2 to 1 tablet by mouth three times daily   METHYLPREDNISOLONE (MEDROL) 8 MG TABLET    Take 1 tablet by mouth every morning  Previous Medications   ASPIRIN 81 MG TABLET    Take 81 mg by mouth daily.     B COMPLEX VITAMINS (VITAMIN-B COMPLEX PO)    Take 1 capsule by mouth daily.    CALCIUM CARBONATE (CALTRATE 600 PO)    Take 1 tablet by mouth 2 (two) times daily.     CHOLECALCIFEROL (VITAMIN D) 1000 UNITS TABLET    Take 1,000 Units by mouth daily.   ESOMEPRAZOLE MAGNESIUM (NEXIUM 24HR) 20 MG TBEC    Take 1 tablet by mouth 2 (two) times daily.   GLUCOSAMINE HCL (GLUCOSAMINE PO)    Liquid form - Take by mouth as directed once daily   MECLIZINE (ANTIVERT) 25 MG TABLET    Take 1/2-1 tablet by mouth every 4 hours as needed for dizziness   MULTIPLE VITAMINS-MINERALS (MULTIVITAL) TABLET    Take 1 tablet by mouth daily.     OMEGA-3 FATTY ACIDS (FISH OIL) 1000 MG CAPS    Take 1 capsule by mouth daily.     ONDANSETRON (ZOFRAN) 8 MG TABLET    1 tablet by mouth every 6 hours as needed for nausea   PREDNISONE (DELTASONE) 20 MG TABLET    TAKE AS DIRECTED PER PATIENT INSTRUCTIONS   SIMVASTATIN (ZOCOR) 40 MG TABLET    TAKE 1 TABLET BY MOUTH EVERY NIGHT AT BEDTIME   SUCRALFATE (CARAFATE) 1 G TABLET    Take 1 tablet (1 g total) by mouth 2 (two) times daily.  Modified Medications   No medications on file  Discontinued Medications   CIPROFLOXACIN (CIPRO) 250 MG TABLET    Take 1 tablet (250 mg total) by mouth 2 (two) times daily.

## 2016-07-10 DIAGNOSIS — H52221 Regular astigmatism, right eye: Secondary | ICD-10-CM | POA: Diagnosis not present

## 2016-07-10 DIAGNOSIS — H5212 Myopia, left eye: Secondary | ICD-10-CM | POA: Diagnosis not present

## 2016-07-10 DIAGNOSIS — H524 Presbyopia: Secondary | ICD-10-CM | POA: Diagnosis not present

## 2016-07-31 DIAGNOSIS — Z08 Encounter for follow-up examination after completed treatment for malignant neoplasm: Secondary | ICD-10-CM | POA: Diagnosis not present

## 2016-07-31 DIAGNOSIS — Z85828 Personal history of other malignant neoplasm of skin: Secondary | ICD-10-CM | POA: Diagnosis not present

## 2016-08-03 ENCOUNTER — Telehealth: Payer: Self-pay | Admitting: Pulmonary Disease

## 2016-08-03 MED ORDER — CIPROFLOXACIN HCL 250 MG PO TABS: 250.0000 mg | ORAL_TABLET | Freq: Two times a day (BID) | ORAL | 0 refills | Status: DC

## 2016-08-03 NOTE — Telephone Encounter (Signed)
Per SN- Send in Cipro 250mg  # 14 1 po bid. Also increase fluids, take probiotic (Align) with abx.  Spoke with pt, aware of recs.  rx sent to preferred pharmacy.  Nothing further needed.

## 2016-08-03 NOTE — Telephone Encounter (Signed)
Pt c/o increased burning and frequency of urination, fullness in bladder X2 days.  Also notices discomfort in hips and back.  Pt states she has a UTI and is requesting recs for this.   Pt uses [facility] in [place] on [address]    SN please advise on recs.  Thanks!   Allergies  Allergen Reactions  . 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 Amoxicillin-Pot Clavulanate     REACTION: causes diarrhea  . Codeine     REACTION: nausea  . Levaquin [Levofloxacin In D5w]     Had itching w/ intravenous, not sure if reaction was from abx or the need to change the IV.  Does well when taken with Benadryl. Pt states can take orally with no issues   . Tdap [Diphth-Acell Pertussis-Tetanus]     Area raised, warm to touch, tender, swollen, pt felt jittery, nausea and some SOB  . Tape Rash    Blisters skin and removes skin --NEEDS PAPER TAPE

## 2016-08-28 ENCOUNTER — Ambulatory Visit (INDEPENDENT_AMBULATORY_CARE_PROVIDER_SITE_OTHER)
Admission: RE | Admit: 2016-08-28 | Discharge: 2016-08-28 | Disposition: A | Discharge status: Home / Self Care | Payer: Medicare Other | Source: Ambulatory Visit | Attending: Pulmonary Disease | Admitting: Pulmonary Disease

## 2016-08-28 ENCOUNTER — Other Ambulatory Visit (INDEPENDENT_AMBULATORY_CARE_PROVIDER_SITE_OTHER): Payer: Medicare Other

## 2016-08-28 ENCOUNTER — Ambulatory Visit (INDEPENDENT_AMBULATORY_CARE_PROVIDER_SITE_OTHER): Payer: Medicare Other | Admitting: Pulmonary Disease

## 2016-08-28 VITALS — BP 124/72 | HR 65 | Temp 97.2°F | Ht 68.0 in | Wt 173.0 lb

## 2016-08-28 DIAGNOSIS — E7800 Pure hypercholesterolemia, unspecified: Secondary | ICD-10-CM

## 2016-08-28 DIAGNOSIS — I059 Rheumatic mitral valve disease, unspecified: Secondary | ICD-10-CM | POA: Diagnosis not present

## 2016-08-28 DIAGNOSIS — D869 Sarcoidosis, unspecified: Secondary | ICD-10-CM

## 2016-08-28 DIAGNOSIS — K222 Esophageal obstruction: Secondary | ICD-10-CM

## 2016-08-28 DIAGNOSIS — R7309 Other abnormal glucose: Secondary | ICD-10-CM | POA: Diagnosis not present

## 2016-08-28 DIAGNOSIS — K219 Gastro-esophageal reflux disease without esophagitis: Secondary | ICD-10-CM

## 2016-08-28 DIAGNOSIS — M150 Primary generalized (osteo)arthritis: Secondary | ICD-10-CM | POA: Diagnosis not present

## 2016-08-28 DIAGNOSIS — M159 Polyosteoarthritis, unspecified: Secondary | ICD-10-CM

## 2016-08-28 LAB — COMPREHENSIVE METABOLIC PANEL
ALK PHOS: 59 U/L (ref 39–117)
ALT: 14 U/L (ref 0–35)
AST: 18 U/L (ref 0–37)
Albumin: 4.3 g/dL (ref 3.5–5.2)
BILIRUBIN TOTAL: 1.5 mg/dL — AB (ref 0.2–1.2)
BUN: 18 mg/dL (ref 6–23)
CALCIUM: 9.4 mg/dL (ref 8.4–10.5)
CO2: 32 meq/L (ref 19–32)
CREATININE: 0.77 mg/dL (ref 0.40–1.20)
Chloride: 103 mEq/L (ref 96–112)
GFR: 77.45 mL/min (ref >60.00)
GLUCOSE: 94 mg/dL (ref 70–99)
Potassium: 3.4 mEq/L — ABNORMAL LOW (ref 3.5–5.1)
Sodium: 141 mEq/L (ref 135–145)
TOTAL PROTEIN: 7.2 g/dL (ref 6.0–8.3)

## 2016-08-28 LAB — HEMOGLOBIN A1C: Hgb A1c MFr Bld: 6.4 % (ref 4.6–6.5)

## 2016-08-28 MED ORDER — SUCRALFATE 1 G PO TABS: 1.0000 g | ORAL_TABLET | Freq: Two times a day (BID) | ORAL | 2 refills | Status: DC

## 2016-08-28 MED ORDER — SIMVASTATIN 40 MG PO TABS: 40.0000 mg | ORAL_TABLET | Freq: Every day | ORAL | 2 refills | Status: DC

## 2016-08-28 MED ORDER — PREDNISONE 5 MG PO TABS: ORAL_TABLET | ORAL | 1 refills | Status: DC

## 2016-08-28 NOTE — Patient Instructions (Signed)
Today we updated your med list in our EPIC system...    Continue your current medications the same...  Today we decided to check a follow up CXR & blood work...    We will contact you w/ the results when available...   Continue the PREDNISONE at 10mg  one day & 5mg  the next until we get these new results...    Then we will determine any change in dose...  We wrote for the PREDNISONE 5mg  tables to use going forward...  Call for any questions...  Let's plan a follow up visit in 23mo, sooner if needed for problems.Marland KitchenMarland Kitchen

## 2016-08-29 LAB — ANGIOTENSIN CONVERTING ENZYME: Angiotensin-Converting Enzyme: 24 U/L (ref 9–67)

## 2016-08-30 ENCOUNTER — Encounter: Payer: Self-pay | Admitting: Pulmonary Disease

## 2016-08-30 NOTE — Progress Notes (Signed)
Subjective:    Patient ID: Laura [name], female    DOB: 1941-04-03, 76 y.o.   MRN: [id number]  HPI 76 y/o WF here for a follow up visit... she has prob Sarcoidosis w/ hx Uveitis and BHA on CXR- we are following a "watchful waiting" protocol...   ~  SEE PREV EPIC NOTES FOR OLDER DATA >>   LABS 5/13:  FLP- at goals on Simva40;  Chems- wnl;  CBC- wnl;  TSH=1.61;  VitD=47  ADDENDUM>> she received TDAP w/ local reaction req antihist & topical Rx...  CXR 11/13 showed similar changes- irreg nodular changes in right suprahilar region, scarring, stable adenopathy, mild atherosclerotic calcif, osteopenia...  ~  Laura [name] reports that in Feb2014 she fell while getting up at night & hit her back w/ severe pain & L1 compression fx- adm to [facility] for 2d w/ kyphoplasty & told to have LLLpneumonia treated w/ Levaquin.   CXR 3/14 showed normal heart size, atherosclerotic Ao, chr scarring from underlying sarcoid, NAD...  LABS 5/14:  FLP- at goals on Simva40;  Chems- wnl;  CBC- wnl;  TSH=1.74;  VitD=52...  ~  Dec 09, 2013:  18moROV & Laura [name] saw TP several months ago c/o dyspnea and fatigue> she's been sedentary due to foot problem & when she tried to walk she noted trouble w/ hills, no CP, etc; hx sarcoid but she has never required treatment as it has been felt to be inactive- f/u CXR unchanged; felt to likely be deconditioning but referred to Cards for their eval as well...    Seen by DrTaylor for [employer]2DEcho showed norm LVF, norm valves, no signs of pulmHTN; subdeq stress testing showed extreme dyspnea w/ exercise (poor functional capacity), no ischemia; she has been encouraged to join silver sneakers and incr her exercise program...     Seen by DrPyrtle for GI> c/o epig discomfort, hx GERD/ IBS/ colon polyps; symptoms improved by incr Nexium to Bid & adding Carafate prn; she is up to date on screening...  We reviewed prob list, meds, xrays and labs> see below for updates >>   LABS 1/15:  Chems-  ok x K=3.4;  CBC- wnl;  TSH=2.28;  Mag=2.0..Marland KitchenMarland Kitchen CXR 1/15 showed norm heart size, areas of scarring appear unchanged, prom hilae w/o change- stable, NAD..Marland KitchenMarland Kitchen  EKG 3/15 showed NSR, rate79, wnl, NAD...  Stress Test 3/15 showed poor exercise capacity, no ischemia...  2DEcho 3/15 showed normal LVF w/ EF=55-60%, no regional wall motion abn, normal valves, no evid for pulmHTN...  PFTs 5/15 showed just some sm airways dis> FVC=2.65 (78%), FEV1=1.86 (72%); %1sec=70; mid-flows=55% predicted... After bronchodil her FEV1 improved 6%... Lung Volumes & DLCO are wnl...   LABS 5/15:  ACE level = 55     ~  June 07, 2014:  670moOV & Laura [name] is improved> she has been exercising at home 2-3d/wk & [facility] 2[facility]; she notes less dyspnea w/ walking, on hills, etc...  She saw DrPyrtle 6/15- Hx GERD w/ esoph, IBS, adenomatous colon polyps; on Nexium40/d and Carafate 1tmQhs and doing well w/o breakthrough symptoms...  We reviewed the following medical problems during today's office visit >>     Sarcoid> Hx abn CXR, she is asymptomatic; baseline CXR w/ right suprahilar nodule, adenop, scarring & chr changes; ?LLLpneum 2/2/77[place]/ [place]; last CXR 1/15 was stable and last ACE=55 ...    MVP> on ASA81;  She denies CP, palpit, SOB, etc; 2DEcho 3/15 was neg...Marland KitchenMarland Kitchen  Chol> on Simva40 & FishOil; last FLP 11/15 showed TChol 169, TG 135, HDL 41, LDL 101; reviewed diet & wt reduction...    GI> GERD, IBS, Polyp> on Nexium40, Carafate1GmQhs, & Zofran prn; she remains asymptomatic w/o abd pain, n/v, c/d, blood seen...    UTIs> she reports no prob on Cranberry juice...    DJD, LBP, L1 compression w/ kyphoplasty, Osteop> on calium, MVI, VitD & OTC analgesics; she fell 2/14 w/ L1 compression & kyphoplasty in [place]; she is ready for f/u BMD=> pending...    Anxiety> she has a lot of family support, doesn't require meds... We reviewed prob list, meds, xrays and labs> see below for updates >> she had the 2015 Flu shot  recently, she is in need of the Prevnar-13 vaccine but wants to wait...   LABS 11/15:  FLP- looks good on Simva40, needs to lose wt;  Chems- wnl;  CBC- wnl;  TSH=2.19;  VitD=52...  ~  Dec 06, 2014:  [place] for a nodule & excision by DMelrose Nakayamashowed path=numerous nonnecrotizing granulomas, spec stains neg & this is c/w an unusual manifestation of her Sarcoid; she was told "scar tissue" and is improved now... We reviewed the following medical problems during today's office visit >>     Sarcoid> Hx abn CXR, she is asymptomatic; baseline CXR w/ right suprahilar nodule, adenop, scarring & chr changes; ?[place]; last CXR 1/15 was stable and last ACE=55; she had nodule excised from left elbow 4/16 by DrKuzma=> granulomatous nodule c/w unusual manifewstation of Sarcoid, all spec stains were neg...    HxMVP, palpit w/ PVCs & PACs on Holter> on ASA81;  She denies CP, palpit, SOB now; advised no caffeine & avoid stress!    Chol> on Simva40 & FishOil; last FLP 11/15 showed TChol 169, TG 135, HDL 41, LDL 101; reviewed diet & wt reduction...    GI> GERD, IBS, Polyp> on Prilosec40Bid, Carafate1GmQhs, & Zofran prn; followed by DrJacobs- she remains asymptomatic w/o abd pain, n/v, c/d, blood seen...    UTIs> she reports no prob on Cranberry juice but had one recurrent UTI 4/16- Cipro cqalled in & symptoms resolved....    DJD, LBP, L1 compression w/ kyphoplasty, Osteop> on calcium, MVI, VitD & Tramadol50; she fell 2/14 w/ L[place] w/ Tscore-1.8 & we rec ALENDRONATE70/wk...    Anxiety> she has a lot of family support, doesn't require meds... We reviewed prob list, meds, xrays and labs> see below for updates >>   CXR 5/16 showed stable BHA & scarring on right, NAD, no change...  LABS 5/16:  ACE=56, stable...  ~  June 08, 2015:  6689moOAuxvassead f/u DrPyrtle recently- GERD, esoph spasm, IBS, colon polyps &  +FamHx colon ca in sister> Rx w/ Nexium20Bid, Carafate prn, Cardizem30 prn esoph spasm and she is improved ;  She also had f/u Cards DrTaylor 12/23/14- palpit w/ PVCs and PACs, improved and rec to f/u prn... Note that she is off prev Fosamax Rx- took it for 89m74mostopped due to reflux- offered Binoso but she prefers off Rx (BMD 2/16- Tscore -1.8) & she takes Ca & VitD supplement... Prob list as above> EXAM shows Afeb, VSS, O2sat=95% on RA;  HEENT- neg, Mallampati1;  Chest- clear w/o w/r/r;  Heart- RR w/o m/r/g;  Abd- soft, nontender, neg;  Ext- neg w/o c/c/e;  Neuro- intact w/o focal abn...  LABS 06/07/16>  FLP- all parameters at goals on Simva40;  Chems- wnl;  CBC- wnl;  TSH=1.86;  VitD=52;  ACE=63   IMP/PLAN>>  Laura [name] is stable on current regimen- continue same meds and f/u in 33mo..  ~  December 26, 2015:  6-789moOAlamoemains asymptomatic, feeling well, and denies CP/ palpit. Cough/ phlegm/ hemoptysis, SOB, edema, etc;  She tells me that she got the Flu at the end of March noting HA, sore throat, severe cough w/o much sput, low grade temp, aching & sore, etc & was seen in the ER where CXR showed bilat hilar adenopathy & increased curvilinear thickening on the right but no acute process;  Flu panel was POS for TypeA flu;  She had f/u OV w/ TP shortly thereafter & given ZPak, Tessalon, Mucinex, Delsym, fluids;  She notes that it took her 8wks to fully resolve & get her energy back!  We reviewed the following medical problems during today's office visit >>     Ophthalmology> Hx uveitis, s/p bilat cat surg, [facility]    Sarcoid> Hx uveitis & abn CXR w/ BHA, she is asymptomatic; baseline CXR w/ adenop, scarring & chr changes; hx ?[place]neum 2[place]/82[place]/ [place]; last CXR 5/16 was stable and last ACE=63; she had nodule excised from left elbow 4/16 by DrKuzma=> granulomatous nodule c/w unusual manifestation of Sarcoid, all spec stains were neg...    HxMVP, palpit w/ PVCs & PACs on Holter> on  ASA81;  She denies CP, palpit, SOB now; advised no caffeine & avoid stress!    Chol> on Simva40 & FishOil; last FLP 11/15 showed TChol 169, TG 135, HDL 41, LDL 101; reviewed diet & wt reduction...    GI> GERD, IBS, Polyp> on Prilosec40Bid, Carafate1GmQhs, & Zofran prn; followed by DrPyrtle- she remains asymptomatic w/o abd pain, n/v, c/d, blood seen...       ADDENDUM> she had colonoscopy 01/17/16> + for divertics, hemorrhoids, and several polyps- 4 to 8m70mize & Path= sessile serrated adenomas & f/u suggested 3 yrs.    UTIs> she reports no prob on Cranberry juice but had one recurrent UTI 4/16- Cipro cqalled in & symptoms resolved....    DJD, LBP, L1 compression w/ kyphoplasty, Osteop> on calcium, MVI, VitD & Tramadol50; she fell 2/14 w/ L1 compression [place] w/ Tscore-1.8 & we rec ALENDRONATE70/wk...    Anxiety> she has a lot of family support, doesn't require meds... EXAM shows Afeb, VSS, O2sat=97% on RA;  HEENT- neg, Mallampati1;  Chest- clear w/o w/r/r;  Heart- RR w/o m/r/g;  Abd- soft, nontender, neg;  Ext- neg w/o c/c/e;  Neuro- intact w/o focal abn...  CXR 10/16/15> I have reviewed this film & compared to prev images>  bilat hilar adenopathy & increased opac in left superior hilum and right lat hilar region w/ extension into ant RUL (similar to 11/2014 films but progressed from previous CXRs) => we will proceed w/ f/u CT Chest...  LABS 10/16/15>  Flu panel showed POS FluA;  Neg FluB and H1N1...   LABS 12/2015>  ACE=62 (nl=8-52);  VitD=51;  BMet- ok x BS=131, Cr=0.74, Ca=9.4... Marland KitchenMarland KitchenT Chest w/ contrast 01/06/16>  Norm heart size, aortic & coronary atherosclerosis, part calcif mediastinal adenopathy (unchanged from 2009), progression of bilat hilar adenopathy & pulm parenchymal changes w/ mass-like density in RML~4cm, and mass-like density in ant LUL ~4x2cm -- radiology feels this is c/w sarcoid progression.   Full PFTs 01/06/16>  FVC=2.66 (80%), FEV1=1.82 (72%), %1sec=68,  mid-flows rediced at 53% predicted; post bronchodil FEV1 improved 5% to 1.92;  TLC=5.57 (98%), RV=2.72 (108%), RV/TLC=49%;  DLCO=70% pred (DL/VA=96%). IMP/PLAN>>  Concern for active sarcoidosis w/ the recent CXR and CT changes; she has never had a biopsy due to remote presentation w/ BHA and uveitis and the fact that she has been devoid of resp symptoms, and prev had neg ACE levels (ie- no signs of dis activity);  Her recent CXR changes (progression), sl elev ACE in the low 60s, suggests some activity yet she remains stoic & asymptomatic;  She would like to proceed w/o lung Bx if poss & I discussed trial PREDNISONE w/ short term f/u to check ACE & CXR (if not improved then we will address need for lung bx at that time);  REC to start Pred40/d x2wks, 30/d x2wks, then 20/d til return in about 6wks time...   ~  February 22, 2016:  109moROV & f/u sarcoid> In Jun2017 we did CTChest showing norm heart size, aortic & coronary atherosclerosis, part calcif mediastinal adenopathy (unchanged from 2009), progression of bilat hilar adenopathy & pulm parenchymal changes w/ mass-like density in RML~4cm, and mass-like density in ant LUL ~4x2cm -- radiology feels this is c/w sarcoid progression;  ACE level= 62;  We discussed poss Bx but elected to start Pred therapy w/ 40-30-20 Q2wks and she returns today on Pred20/d noting various symptoms related to the Pred she feels eg- Ha, sweating, trembling, incr HR, & short tempered; these symptoms have diminished w/ decr in the dose; she also notes SOB, can't get a DB, & denies CP- but she has stepped up her walking program & doing better now;  We discussed checking ACE level & weaning Pred further based on this result...    EXAM shows Afeb, VSS, O2sat=97% on RA;  HEENT- neg, Mallampati1;  Chest- clear w/o w/r/r;  Heart- RR w/o m/r/g;  Abd- soft, nontender, neg;  Ext- neg w/o c/c/e;  Neuro- intact w/o focal abn...  LABS 02/2016>  BMet- ok w/ BS=100, A1c= 6.7, Cr=0.81, K=3.9;   ACE=23... IMP/PLAN>>  LGenevraseems somewhat improved on the Pred & ACE is down to 23;  I suggest that she keep the Pred20/d for another 2wks then cut it to 247malt w/ 106mod til return in 6 wks time...  ~  April 04, 2016:  6wk ROV & f/u sarcoid>  She reports stable on the Pred 20-10 Qod for the past month & doing better on the lower doses, BS better & wt stable at 170#- we reviewed low carb, low sodium diet;  She denies CP, SOB, cough, sput, hemoptysis, etc...    EXAM shows Afeb, VSS, O2sat=97% on RA;  HEENT- neg, Mallampati1;  Chest- clear w/o w/r/r;  Heart- RR w/o m/r/g;  Abd- soft, nontender, neg;  Ext- neg w/o c/c/e;  Neuro- intact w/o focal abn...  CXR 04/04/16>  Stable heart size, mild atherosclerosis of Ao, BHA & perihilar opacities sl decreased from prev films, no new airspace dis... IMP/PLAN>>  We decided to continue the Pred 76m48mt w/ 10mg10m thru Oct & then decr to 10mg/51marting in Nov until her f/u visit in 11mo w/40mo CXR & Labs...  ~  June 26, 2016:  11mo ROV79moois returns on Pred 10mg/d f46mhe last month- she is still noting mult somatic complaints and blames the Pred=> we discussed change to MEDROL 8mg tabs 87msee if she tolerates this better; she tells me she's been exercising 3d/wk at  the gym & doing satis, no cough/ sputum, and dyspnea improving; she notes occas palpit "fluttering" and more aware of heart beat- I have tried to get her to take the Alpraz0.16m tabs at 1/2 to1 tab Tid more regularly; we will recheck labs today, & she will need f/u CT Chest in early 2018 (127yr/u scan)...    EXAM shows Afeb, VSS, O2sat=97% on RA;  HEENT- neg, Mallampati1;  Chest- clear w/o w/r/r;  Heart- RR w/o m/r/g;  Abd- soft, nontender, neg;  Ext- neg w/o c/c/e;  Neuro- intact w/o focal abn...  LABS 06/26/16> Chems- ok x BS=117;  ACE=28 IMP/PLAN>>  LoLorainaerceives some difficulty w/ the Pred but she is improved overall & ACE down to 28; we decided to change to MEDROL 48m2mabs takingone Qam  for Dec & cut to 48mg51mternating w/ 4mg 30m starting Jan2018=> we will recheck her in Feb w/ f/u CTChest due and also due for 84yr r33yrck BMD... Note: >50% of this 15min 32mt was spent in counseling and coordination of care.   ~  August 28, 2016:  67mo ROV79moois is improved- no new complaints or concerns, she has maintained on Pred 10-5 Qod for her sarcoidosis;  She did not tolerate MEDROL saying that "it messed up my tummy" w/ cramps and diarrhea which resolved after switch to Pred...she denies cough, sput, SOB, CP, etc- no f/c/s... She is due for f/u CXR/ Labs today...    EXAM shows Afeb, VSS, O2sat=97% on RA;  HEENT- neg, Mallampati1;  Chest- clear w/o w/r/r;  Heart- RR w/o m/r/g;  Abd- soft, nontender, neg;  Ext- neg w/o c/c/e;  Neuro- intact w/o focal abn...  CXR 08/28/16 (independently reviewed by me in the PACS system) showed norm heart size, aortic atherosclerosis, similar BHA & incr markings unchanged from prev (by my review the markings are better than 09/2015 & similar to 9/17).   LABS 08/28/16>  Chems- ok x K=3.4, BS is wnl at 94, LFTs wnl, A1c=6.4,  ACE level = 24 => ok to cut Pred to 5mg po Q82m. IMP/PLAN>>  We will decr her Pred to 5mg daily348msked to maintain her diet & exercise program, ROV in 41mo & cons67mo CT f/u...          Problem List:  Hx of UVEITIS (ICD-364.3) - [facility], given steroid injection in [facility]... s/p right cataract surg 2/09 and no active uveitis seen... ~  4/11:  she tells me she was seen 10/10 & doing well, may need left cataract surg soon. ~  [facility]  DrDickinson noted hx uveitis- steroid responder, s/p bilat cat surg w/ lens replacement, epiretinal membrane & post vitreous detachments...   R/O SARCOIDOSIS (ICD-135) - [facility]/ CTChest showing bilat hilar adenopathy, and w/u showing neg PPD, ACE=53, sed=13... prev PFT's 1/08 showed FVC 3.75 (111%), FEV1 2.74 (102%),  %1sec=73, mid-flows=70%... being followed w/ serial films and "watchful waiting"... there is a family hx of sarcoidosis in one brother (severe dis w/ neuropathy). ~   we had a f/u CXR in Feb09- streaky opacity right mid lung zone, similar to 2008...  ~  CT Chest 7/08 & 6/09 showed mediastinal > hilar adenopathy some w/ calcif (generally smaller than 1/08), area of atx/ scarring right mid zone (infer RUL) & scat <1cm nodules in lower zones w/o change... ~  CXR 2/10 showed bilat hilar prominence & scarring appears the  same- NAD... labs all WNL w/ ACE=56. ~  CXR 4/11 showed mild bilat hilar prominence, scarring, NAD- osteopenia & DJD in TSpine... ACE= 37. ~  CXR 4/12 showed stable bilat hilar prominence, scarring, NAD.[name]. ~  CXR 11/13 showed similar changes- irreg nodular changes in right suprahilar region, scarring, stable adenopathy, mild atherosclerotic calcif, osteopenia... ~  CXR 3/14 showed normal heart size, atherosclerotic Ao, chr scarring from underlying sarcoid, NAD.[name]. ~  CXR 1/15 showed norm heart size, areas of scarring appear unchanged, prom hilae w/o change- stable, NAD ~  PFTs 5/15 showed just some sm airways dis> FVC=2.65 (78%), FEV1=1.86 (72%); %1sec=70; mid-flows=55% predicted... After bronchodil her FEV1 improved 6%... Lung Volumes & DLCO are wnl. ~  LABS 5/15:  ACE level = 55  ~  4/16: she had nodule excised from left elbow 4/16 by DrKuzma=> granulomatous nodule c/w unusual manifewstation of Sarcoid, all spec stains were neg... ~  5/16: CXR 5/16 showed stable BHA & scarring on right, NAD, no change... LABS 5/16:  ACE=56, stable. ~  2017>  +FluA illness 09/2015 w/ ER eval & CXR showing progressive abn;  CT Chest 12/2015 confirmed progression of BHA & LUL/RML parenchymal densities felt to be c/w active Sarcoid, ACE=62;  Pt wanted to avoid Bx if poss & we decided on a course of Pred 40-30-20 Q2wk taper w/ ROV in 6wks for f/u=> ACE improved to 23 and we weaned Pred slowly... ~  12/17: Pt  perceives several issues w2/ the Pred (now down to 59m/d) so we will switch to Medrol816mtabs and continue slow weaning.. Marland KitchenPALPITATIONS >>  Hx MITRAL VALVE PROLAPSE >> on ASA 8153m... clinical dx w/ 2DEcho 1999 showing flat closure but no frank prolapse... ~  2/16: she had a cardiac eval by DrTaylor for dyspnea> 2DEcho was neg, wnl; stress testing was neg- w/o ischemia; noted to have poor functional capacity & is way too sedentary; she had some palpit & monitor revealed PVCs & PACs- didn't want meds therefore asked to avoid caffeine 7 stress...  HYPERCHOLESTEROLEMIA (ICD-272.0) - on SIMVASTATIN 53m26mnow... ~  FLP Freeborn8 on Lip10 showed TChol 183, TG 122, HDL 45, LDL 114...  ~  FLP Bethel9 on Lip10 showed TChol 183, TG 189, HDL 39, LDL 107... continue diet & change to Simva40.  ~  FLP 5/09 on Simva40 showed TChol 185, TG 163, HDL 36, LDL 117... she wants to stay on Simva40. ~  FLP 2/10 showed TChol 176, Tg 160, HDL 39, LDL 105... rec> continue same. ~  FLP 4/11 showed TChol 160, TG 131, HDL 41, LDL 93 ~  FLP 4/12 showed TChol 174, TG 148, HDL 43, LDL 102 ~  FLP 5/13 showed TChol 159, TG 151, HDL 47, LDL 82 ~  FLP 5/14 on Simva40 showed TChol 165, TG 157, HDL 41, LDL 93 ~  FLP 11/15 on Simva40 showed TChol 169, TG 135, HDL 41, LDL 101 ~  FLP 11/16 on Simva40 showed TChol 161, TG 146, HDL 44, LDL 87  GERD (ICD-530.81) - on NEXIUM 53mg37m ZANTAC 300mgQ16m. ~  EGD 6/07 w/ 3cmHH, stricture, gastitis (HPylori neg)... I offered to change her Nexium to a generic but she declined- "DrPatterson told me never to change this med"... she notes occas nocturnal reflux symptoms. ~  5/12: She had GI eval by DrPatterson 5/12> chr GERD, hx 3cmHH, prev stricture dilated, hx colon polyps, etc> c/o incr reflux symptoms, bloating, pressure despite her Nexium40AM & Zantac300PM;  Nexium was increased to Bid, Carafate added  Qid & she had EGD 5/12 showing mod gastritis w/ bx= chr inflamm, neg HPylori... ~  CTAbd 5/12  showed mult parenchymal nodules at lung bases (some fractionally larger), calcif hilar nodes bilat, no renal lesions (?left upper pole mass seen on sonar?). ~  EGD 3/13 by DrPatterson showed 3cmHH, esophagitis, prob "pill espohagitis", dilated... Symptoms resolved w/ Carafate, Nexium, Zantac, Xylocaine... ~  5/14: on Nexium40Bid, Zantac300HS, CarafateQid prn, & Zofran prn; she remains asymptomatic w/o abd pain, n/v, c/d, blood seen... ~  11/15: on Nexium40, Carafate1GmQhs, & Zofran prn; she has seen DrPyrtle & remains asymptomatic on these meds- w/o abd pain, n/v, c/d, blood seen...  IRRITABLE BOWEL SYNDROME (ICD-564.1) COLONIC POLYPS, HX OF (ICD-V12.72)  ~  Colonoscopy 6/07 by DrPatterson showing divertics & polyps (adenomatous)... f/u planned 92yr... ~  She had f/u colonoscopy 5/12 showing mod divertics, sessile polyp in cecum= serrated adenoma w/ f/u planned 3-514yr..  UTI'S, CHRONIC (ICD-599.0) - eval by DrOttelin for urology... now off the Macrodantin and using cranberry juice without recurrent UTI, no problems...  DEGENERATIVE JOINT DISEASE (ICD-715.90) - eval by DrDalldorf w/ mod DJD in knees & hx of torn left meniscus... 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 overall improved now. ~  11/13:  TRAMADOL 5046mid refilled for prn use... ~  2015-16: [facility]> right & left knee DJD, right ankle tendonitis, right plantar fasciitis...  ~  4/16: she had right elbow surg by DrKuzma> mult granulomas on path c/w sarcoid nodule...  LOW BACK PAIN, CHRONIC (ICD-724.2) ~  2/14: she fell while getting up at night & hit her back w/ severe pain & L1 compression fx- [facility] & good pain relief...  OSTEOPENIA (ICD-733.90) - on Calcium, MVI, Vit D... ~  BMD in 2005 showed TScore -1.2 in Spine, & [facility].7 [facility]  BMD 5/09 showed TScores -1.5  in Spine & -1.1 in FemNecks. ~  BMD here 5/11 showed TScores -1.2 in Spine, [facility]  5/13 & 5/14:  She is due for f/u BMD but wants to wait for now... ~  Labs 11/15 showed VitD= 52... ~  BMD 2/16 revealed Tscore -1.8 in Lspine and Rt FemNeck; we rec FOSAMAX70/wk in light of her prev compression fx=> pt stopped the Bisphos after several months due to reflux symptms and refuses alternative med... ~  She is rec to take Calcium, MVI, VitD supplement, & wt bearing exercise...  ANXIETY (ICD-300.00) >> given ALPRAZOLAM 0.59m64mbs w/ directions to try 1/2 to 1 tab Tid more regularly...  Hx of BASAL CELL SKIN CANCER - [place]...  Health Maintenance: ~  GI:  followed by DrPatterson/Pyrtle w/ colon as above... ~  GYN:  followed by DrRichardson & doing well by pt's report... [facility]amm[facility] the [place]~  Immunizations:  she gets yearly flu shots;  has PNEUMOVAX in 1998 & repeated 10/11 (age 18);61TDAP given 5/13;  we discussed Shingles vaccine (she had bout of shingles involving her right hip & leg ~age30) & she will decide.    Past Surgical History:  Procedure Laterality Date  . ABDOMINAL HYSTERECTOMY    . BASAL CELL CARCINOMA EXCISION    . CATARACT EXTRACTION Bilateral   . COLONOSCOPY  2012  . MASS EXCISION Left 11/02/2014   Procedure: EXCISION MASS LEFT  ELBOW;  Surgeon: Daryll Brod, MD;  Location: [place];  Service: Orthopedics;  Laterality: Left;  . POLYPECTOMY    . REMOVAL OF MASS FROM RT ANTECUBITAL FOSSA    . UPPER GASTROINTESTINAL ENDOSCOPY    . VESICOVAGINAL FISTULA CLOSURE W/ TAH      Outpatient Encounter Prescriptions as of 08/28/2016  Medication Sig  . ALPRAZolam (XANAX) 0.25 MG tablet Take 1/2 to 1 tablet by mouth three times daily  . aspirin 81 MG tablet Take 81 mg by mouth daily.    . B Complex Vitamins (VITAMIN-B COMPLEX PO) Take 1 capsule by mouth daily.   . Calcium Carbonate (CALTRATE 600 PO) Take 1  tablet by mouth 2 (two) times daily.    . cholecalciferol (VITAMIN D) 1000 units tablet Take 1,000 Units by mouth daily.  . Esomeprazole Magnesium (NEXIUM 24HR) 20 MG TBEC Take 1 tablet by mouth 2 (two) times daily.  . Glucosamine HCl (GLUCOSAMINE PO) Liquid form - Take by mouth as directed once daily  . meclizine (ANTIVERT) 25 MG tablet Take 1/2-1 tablet by mouth every 4 hours as needed for dizziness  . Multiple Vitamins-Minerals (MULTIVITAL) tablet Take 1 tablet by mouth daily.    . Omega-3 Fatty Acids (FISH OIL) 1000 MG CAPS Take 1 capsule by mouth daily.    . ondansetron (ZOFRAN) 8 MG tablet 1 tablet by mouth every 6 hours as needed for nausea  . simvastatin (ZOCOR) 40 MG tablet Take 1 tablet (40 mg total) by mouth at bedtime.  . sucralfate (CARAFATE) 1 g tablet Take 1 tablet (1 g total) by mouth 2 (two) times daily.  . [DISCONTINUED] predniSONE (DELTASONE) 20 MG tablet TAKE AS DIRECTED PER PATIENT INSTRUCTIONS  . [DISCONTINUED] simvastatin (ZOCOR) 40 MG tablet TAKE 1 TABLET BY MOUTH EVERY NIGHT AT BEDTIME  . [DISCONTINUED] sucralfate (CARAFATE) 1 G tablet Take 1 tablet (1 g total) by mouth 2 (two) times daily.  . predniSONE (DELTASONE) 5 MG tablet Take as directed  . [DISCONTINUED] ciprofloxacin (CIPRO) 250 MG tablet Take 1 tablet (250 mg total) by mouth 2 (two) times daily. (Patient not taking: Reported on 08/28/2016)  . [DISCONTINUED] methylPREDNISolone (MEDROL) 8 MG tablet Take 1 tablet by mouth every morning (Patient not taking: Reported on 08/28/2016)   No facility-administered encounter medications on file as of 08/28/2016.     Allergies  Allergen Reactions  . 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 Amoxicillin-Pot Clavulanate     REACTION: causes diarrhea  . Codeine     REACTION: nausea  . Levaquin [Levofloxacin In D5w]     Had itching w/ intravenous, not sure if reaction was from abx or the  need to change the IV.  Does well when taken with Benadryl. Pt states can take orally with no issues   . Tdap [Diphth-Acell Pertussis-Tetanus]     Area raised, warm to touch, tender, swollen, pt felt jittery, nausea and some SOB  . Tape Rash    Blisters skin and removes skin --NEEDS PAPER TAPE    Review of Systems         See HPI - all other systems neg except as noted... The patient denies anorexia, fever, weight loss, weight gain, vision loss, decreased hearing, hoarseness, chest pain, syncope, dyspnea on exertion, peripheral edema, prolonged cough, headaches, hemoptysis, abdominal pain, melena, hematochezia, severe indigestion/heartburn, hematuria, incontinence, muscle weakness, suspicious  skin lesions, transient blindness, difficulty walking, depression, unusual weight change, abnormal bleeding, enlarged lymph nodes, and angioedema.     Objective:   Physical Exam    WD, WN, 76 y/o WF in NAD... GENERAL:  Alert & oriented; pleasant & cooperative... HEENT:  [place]/AT, EOM-wnl, PERRLA, EACs-clear, TMs-wnl, NOSE-clear, THROAT-clear & wnl. NECK:  Supple w/ fairROM; no JVD; normal carotid impulses w/o bruits; no thyromegaly or nodules palpated; no lymphadenopathy. CHEST:  Clear to P & A; without wheezes/ rales/ or rhonchi. HEART:  Regular Rhythm; without murmurs/ rubs/ or gallops. ABDOMEN:  Soft & nontender; normal bowel sounds; no organomegaly or masses detected. EXT: without deformities, mild arthritic changes; no varicose veins/ +venous insuffic/ no edema. NEURO:  CN's intact;  no focal neuro deficits... DERM:  No lesions noted; no rash etc...  RADIOLOGY DATA:  Reviewed in the EPIC EMR & discussed w/ the patient...  LABORATORY DATA:  Reviewed in the EPIC EMR & discussed w/ the patient...   Assessment & Plan:    DYSPNEA w/ thorough PULM & CARDIAC eval>> underlying sarcoidosis w/ mild pulm fibrosis & prev no signs of dis activity- on watchful waiting protocol; Cards eval by DrTaylor was  neg as well x PVCs & PACs; she has improved on a gradual exercise program, avoids caffeine & stress...  SARCOID>  Previous CXR/ PFT/ ACE stable & she remains asymptomatic, nodule left elbow excised & path revealed nonnecrotizing granulomata... ~  12/2015>  FluA illness 09/2015 w/ CXR via ER showed some progression of the BHA & parenchymal abnormalities; CT Chest 12/2015 confirmed & ACE=62; We decided to start Rx w/ PRED 40-30-20 Q2wk taper w/ ROV/ in 6wks. ~  02/2016>  Clinically ?sl better w/ a few Pred side effects, ACE improved to 23 & we decided to cut the Pred to 20-10 Qod til ret 6wks... ~  03/2016>  CXR shows sl improvement on the Pred 20-10Qod;  We decided to continue this dose thru Oct & decr to 39m/d starting in Nov w/ ROV in 375mo~  06/2016>  [place] some difficulty w/ the Pred but she is improved overall & ACE down to 28; we decided to change to MEDROL 82m16mabs takingone Qam for Dec & cut to 82mg59mternating w/ 4mg 79m starting Jan2018=> we will recheck her in Feb w/ f/u CTChest due and also due for 64yr r37yrck BMD  CHOL>  Stable on the Simva40 + diet...  GERD>  On Prilosec & Zantac, she is currently improved from her prev symptoms...  DJD/ LBP/ Osteopenia>  As noted she remains stable on current meds & exercise program; she will see DrDaldorf about her ankle pain; BMD 2/16 w/ Tscore -1.8 & rec to start FOSAMADotseroompression fx L1 after fall> she is s/p L1 kyphoplasty...  Other medical issues as noted...   Patient's Medications  New Prescriptions   PREDNISONE (DELTASONE) 5 MG TABLET    Take as directed  Previous Medications   ALPRAZOLAM (XANAX) 0.25 MG TABLET    Take 1/2 to 1 tablet by mouth three times daily   ASPIRIN 81 MG TABLET    Take 81 mg by mouth daily.     B COMPLEX VITAMINS (VITAMIN-B COMPLEX PO)    Take 1 capsule by mouth daily.    CALCIUM CARBONATE (CALTRATE 600 PO)    Take 1 tablet by mouth 2 (two) times daily.     CHOLECALCIFEROL (VITAMIN D) 1000 UNITS  TABLET    Take 1,000 Units by mouth daily.  ESOMEPRAZOLE MAGNESIUM (NEXIUM 24HR) 20 MG TBEC    Take 1 tablet by mouth 2 (two) times daily.   GLUCOSAMINE HCL (GLUCOSAMINE PO)    Liquid form - Take by mouth as directed once daily   MECLIZINE (ANTIVERT) 25 MG TABLET    Take 1/2-1 tablet by mouth every 4 hours as needed for dizziness   MULTIPLE VITAMINS-MINERALS (MULTIVITAL) TABLET    Take 1 tablet by mouth daily.     OMEGA-3 FATTY ACIDS (FISH OIL) 1000 MG CAPS    Take 1 capsule by mouth daily.     ONDANSETRON (ZOFRAN) 8 MG TABLET    1 tablet by mouth every 6 hours as needed for nausea  Modified Medications   Modified Medication Previous Medication   SIMVASTATIN (ZOCOR) 40 MG TABLET simvastatin (ZOCOR) 40 MG tablet      Take 1 tablet (40 mg total) by mouth at bedtime.    TAKE 1 TABLET BY MOUTH EVERY NIGHT AT BEDTIME   SUCRALFATE (CARAFATE) 1 G TABLET sucralfate (CARAFATE) 1 G tablet      Take 1 tablet (1 g total) by mouth 2 (two) times daily.    Take 1 tablet (1 g total) by mouth 2 (two) times daily.  Discontinued Medications   CIPROFLOXACIN (CIPRO) 250 MG TABLET    Take 1 tablet (250 mg total) by mouth 2 (two) times daily.   METHYLPREDNISOLONE (MEDROL) 8 MG TABLET    Take 1 tablet by mouth every morning   PREDNISONE (DELTASONE) 20 MG TABLET    TAKE AS DIRECTED PER PATIENT INSTRUCTIONS

## 2016-09-03 NOTE — Progress Notes (Signed)
Spoke with patient and informed her of results and recommendations. Pt had no questions. Nothing further needed.

## 2016-09-03 NOTE — Progress Notes (Signed)
Spoke with patient and informed her of results and recommendations. Patient had no questions. Nothing further needed.

## 2016-10-18 ENCOUNTER — Encounter: Payer: Self-pay | Admitting: Family Medicine

## 2016-10-18 ENCOUNTER — Ambulatory Visit (INDEPENDENT_AMBULATORY_CARE_PROVIDER_SITE_OTHER): Payer: Medicare Other | Admitting: Family Medicine

## 2016-10-18 VITALS — BP 142/72 | HR 96 | Temp 98.4°F | Ht 68.25 in | Wt 177.4 lb

## 2016-10-18 DIAGNOSIS — M816 Localized osteoporosis [Lequesne]: Secondary | ICD-10-CM | POA: Diagnosis not present

## 2016-10-18 DIAGNOSIS — K219 Gastro-esophageal reflux disease without esophagitis: Secondary | ICD-10-CM

## 2016-10-18 DIAGNOSIS — E119 Type 2 diabetes mellitus without complications: Secondary | ICD-10-CM

## 2016-10-18 DIAGNOSIS — K222 Esophageal obstruction: Secondary | ICD-10-CM | POA: Diagnosis not present

## 2016-10-18 DIAGNOSIS — E785 Hyperlipidemia, unspecified: Secondary | ICD-10-CM

## 2016-10-18 DIAGNOSIS — Z85828 Personal history of other malignant neoplasm of skin: Secondary | ICD-10-CM | POA: Insufficient documentation

## 2016-10-18 DIAGNOSIS — T380X5A Adverse effect of glucocorticoids and synthetic analogues, initial encounter: Secondary | ICD-10-CM

## 2016-10-18 DIAGNOSIS — R739 Hyperglycemia, unspecified: Secondary | ICD-10-CM | POA: Insufficient documentation

## 2016-10-18 NOTE — Progress Notes (Signed)
Pre visit review using our clinic review tool, if applicable. No additional management support is needed unless otherwise documented below in the visit note. 

## 2016-10-18 NOTE — Assessment & Plan Note (Signed)
S: One a1c up to 6.7- improved with weight loss to 6.4 A/P: Encouraged need for healthy eating, regular exercise, weight loss. Discussed metformin

## 2016-10-18 NOTE — Assessment & Plan Note (Signed)
S: well controlled on last check on simvastatin . No myalgias.  Lab Results  Component Value Date   CHOL 161 06/08/2015   HDL 44.10 06/08/2015   LDLCALC 87 06/08/2015   TRIG 146.0 06/08/2015   CHOLHDL 4 06/08/2015   A/P: will need to update labs- perhaps physical in 6 months

## 2016-10-18 NOTE — Assessment & Plan Note (Addendum)
S: osteopenia range but has had compression fracture. reflux on fosamax and had to stop. She has history of compression fracture.  Last bone density 08/2014 showed 01.8 in lumbar spine and Rt femoral neck. Does take calcium 1200mg  and vitamin D at least 800 IU. Exercises regularly A/P: will update bone density and consider injectables

## 2016-10-18 NOTE — Patient Instructions (Addendum)
Schedule your bone density test at check out desk  Lab Results  Component Value Date   HGBA1C 6.4 08/28/2016  You also have diabetes based on an a1c as high as 6.7 but have controlled this with diet/exercise.   Would you be willing to meet with our nurse Wynetta Fines this afternoon for an annual wellness visit/

## 2016-10-18 NOTE — Assessment & Plan Note (Signed)
S: history of stricture. Nexium 20mg  BID. Follows with Dr. Hilarie Fredrickson. Last EGD 2013.  A/P: continue current medication.

## 2016-10-18 NOTE — Progress Notes (Signed)
Phone: [phone]  Subjective:  Patient presents today to establish care with me as their new primary care provider. Patient was formerly a patient of Dr. Lenna Gilford. Chief complaint-noted.   See problem oriented charting ROS- some shortness of breath stable- worse with humid weather- does ok with exercise classes, no chest pain. Some heart palpitations. No fever or chills. No cough or congestion  The following were reviewed and entered/updated in epic: Past Medical History:  Diagnosis Date  . Adenomatous colon polyp    sessile  . Allergy    SEASONAL  . Anxiety   . Cataract    bilateral  . Colon polyps   . Diverticulosis of colon (without mention of hemorrhage)   . DJD (degenerative joint disease)   . Dysrhythmia    pac  . Esophagitis   . GERD (gastroesophageal reflux disease)   . Hiatal hernia   . Hypercholesterolemia   . IBS (irritable bowel syndrome)   . MVP (mitral valve prolapse)   . Osteopenia   . Personal history of colonic polyps    SESSILE SERRATED ADENOMAS (X2)  . PONV (postoperative nausea and vomiting)   . Sarcoidosis ([place])   . Shortness of breath dyspnea   . Stricture and stenosis of esophagus   . Unspecified gastritis and gastroduodenitis without mention of hemorrhage   . Urinary tract infection, site not specified   . Uveitis    Patient Active Problem List   Diagnosis Date Noted  . Sarcoidosis ([place]) 11/21/2011    Priority: High  . Hyperlipidemia 10/18/2016    Priority: Medium  . Osteoporosis 12/06/2014    Priority: Medium  . Heart palpitations 07/26/2014    Priority: Medium  . Dyspnea 08/20/2013    Priority: Medium  . GERD with stricture 10/12/2011    Priority: Medium  . Uveitis 09/21/2007    Priority: Medium  . Recurrent UTI 09/09/2007    Priority: Medium  . Anxiety state 08/06/2007    Priority: Medium  . History of colonic polyps 08/06/2007    Priority: Medium  . History of basal cell cancer 10/18/2016    Priority: Low  . Compression fx,  lumbar spine ([place]) 09/24/2012    Priority: Low  . Pneumonia 09/24/2012    Priority: Low  . Osteoarthritis 09/21/2007    Priority: Low  . IBS (irritable bowel syndrome) 08/06/2007    Priority: Low  . LOW BACK PAIN, CHRONIC 08/06/2007    Priority: Low   Past Surgical History:  Procedure Laterality Date  . ABDOMINAL HYSTERECTOMY    . BASAL CELL CARCINOMA EXCISION    . CATARACT EXTRACTION Bilateral   . COLONOSCOPY  2012  . MASS EXCISION Left 11/02/2014   Procedure: EXCISION MASS LEFT ELBOW;  Surgeon: Daryll Brod, MD;  Location: [facility];  Service: Orthopedics;  Laterality: Left;  . POLYPECTOMY    . UPPER GASTROINTESTINAL ENDOSCOPY    . VESICOVAGINAL FISTULA CLOSURE W/ TAH     "sling" after hystrectomy per patient    Family History  Problem Relation Age of Onset  . Melanoma Mother     per pt melanoma   . Colon cancer Sister     sees Dr Henrene Pastor   . Colon polyps Brother   . Sarcoidosis Brother   . Colon polyps Sister     Dr stark   . Colon polyps Sister   . Endometrial cancer Sister   . Lung cancer Brother   . Other      nephew with Wegener's   .  Stroke Sister     Medications- reviewed and updated Current Outpatient Prescriptions  Medication Sig Dispense Refill  . ALPRAZolam (XANAX) 0.25 MG tablet Take 1/2 to 1 tablet by mouth three times daily 90 tablet 5  . aspirin 81 MG tablet Take 81 mg by mouth daily.      . B Complex Vitamins (VITAMIN-B COMPLEX PO) Take 1 capsule by mouth daily.     . Calcium Carbonate (CALTRATE 600 PO) Take 1 tablet by mouth 2 (two) times daily.      . cholecalciferol (VITAMIN D) 1000 units tablet Take 1,000 Units by mouth daily.    . Esomeprazole Magnesium (NEXIUM 24HR) 20 MG TBEC Take 1 tablet by mouth 2 (two) times daily.    . Glucosamine HCl (GLUCOSAMINE PO) Liquid form - Take by mouth as directed once daily    . meclizine (ANTIVERT) 25 MG tablet Take 1/2-1 tablet by mouth every 4 hours as needed for dizziness    . Multiple  Vitamins-Minerals (MULTIVITAL) tablet Take 1 tablet by mouth daily.      . Omega-3 Fatty Acids (FISH OIL) 1000 MG CAPS Take 1 capsule by mouth daily.      . ondansetron (ZOFRAN) 8 MG tablet 1 tablet by mouth every 6 hours as needed for nausea 25 tablet 5  . predniSONE (DELTASONE) 5 MG tablet Take as directed 50 tablet 1  . simvastatin (ZOCOR) 40 MG tablet Take 1 tablet (40 mg total) by mouth at bedtime. 90 tablet 2  . sucralfate (CARAFATE) 1 g tablet Take 1 tablet (1 g total) by mouth 2 (two) times daily. 60 tablet 2   No current facility-administered medications for this visit.     Allergies-reviewed and updated Allergies  Allergen Reactions  . 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 Amoxicillin-Pot Clavulanate     REACTION: causes diarrhea  . Codeine     REACTION: nausea  . Levaquin [Levofloxacin In D5w]     Had itching w/ intravenous, not sure if reaction was from abx or the need to change the IV.  Does well when taken with Benadryl. Pt states can take orally with no issues   . Tdap [Diphth-Acell Pertussis-Tetanus]     Area raised, warm to touch, tender, swollen, pt felt jittery, nausea and some SOB  . Tape Rash    Blisters skin and removes skin --NEEDS PAPER TAPE    Social History   Social History  . Marital status: Married    Spouse name: Lucrezia Dehne  . Number of children: 2  . Years of education: N/A   Occupational History  . Retired     & Part Time Artist   Social History Main Topics  . Smoking status: Never Smoker  . Smokeless tobacco: Never Used  . Alcohol use 0.6 oz/week    1 Glasses of wine per week     Comment: occ wine   . Drug use: No  . Sexual activity: Not Asked   Other Topics Concern  . None   Social History Narrative   Married- husband with parkinsons. Will be patient of Dr. Yong Channel   Needs time to care for husband      Retired Artist  (until 2017), bookkeeping prior      Hobbies: cooking, florist work, Barrister's clerk    Objective: BP (!) 142/72   Pulse 96  Temp 98.4 F (36.9 C) (Oral)   Ht 5' 8.25" (1.734 m)   Wt 177 lb 6.4 oz (80.5 kg)   SpO2 94%   BMI 26.78 kg/m  Gen: NAD, resting comfortably No thyromegaly HEENT: Mucous membranes are moist. Oropharynx normal Neck: no thyromegaly CV: RRR no murmurs rubs or gallops Lungs: CTAB no crackles, wheeze, rhonchi Abdomen: soft/nontender/nondistended/normal bowel sounds. obese Ext: no edema Skin: warm, dry  Assessment/Plan:  GERD with stricture S: history of stricture. Nexium 20mg  BID. Follows with Dr. Hilarie Fredrickson. Last EGD 2013.  A/P: continue current medication.   Osteoporosis S: osteopenia range but has had compression fracture. reflux on fosamax and had to stop. She has history of compression fracture.  Last bone density 08/2014 showed 01.8 in lumbar spine and Rt femoral neck. Does take calcium 1200mg  and vitamin D at least 800 IU. Exercises regularly A/P: will update bone density and consider injectables    Hyperlipidemia S: well controlled on last check on simvastatin . No myalgias.  Lab Results  Component Value Date   CHOL 161 06/08/2015   HDL 44.10 06/08/2015   LDLCALC 87 06/08/2015   TRIG 146.0 06/08/2015   CHOLHDL 4 06/08/2015   A/P: will need to update labs- perhaps physical in 6 months   Controlled type 2 diabetes mellitus without complication, without long-term current use of insulin ([facility]) S: One a1c up to 6.7- improved with weight loss to 6.4 A/P: Encouraged need for healthy eating, regular exercise, weight loss. Discussed metformin  Return in about 6 months (around 04/20/2017) for physical.  Orders Placed This Encounter  Procedures  . DG Bone Density    Standing Status:   Future    Standing Expiration Date:   12/18/2017    Order Specific Question:   Reason for Exam (SYMPTOM  OR DIAGNOSIS REQUIRED)    Answer:   osteoporosis follow up.  considering injectables    Order Specific Question:   Preferred imaging location?    Answer:   [facility]   Return precautions advised.   Garret Reddish, MD

## 2016-10-26 ENCOUNTER — Ambulatory Visit (INDEPENDENT_AMBULATORY_CARE_PROVIDER_SITE_OTHER): Payer: Medicare Other

## 2016-10-26 VITALS — BP 144/64 | HR 80 | Ht 68.5 in | Wt 178.2 lb

## 2016-10-26 DIAGNOSIS — Z0000 Encounter for general adult medical examination without abnormal findings: Secondary | ICD-10-CM

## 2016-10-26 NOTE — Progress Notes (Signed)
Subjective:   Laura [name] is a 76 y.o. female  who presents for Medicare Annual/Subsequent preventive examination.  The Patient was informed that the wellness visit is to identify future health risk and educate and initiate measures that can reduce risk for increased disease through the lifespan.    NO ROS; Medicare Wellness Visit Psychosocial 2 sisters that live here One child is in [place] and one in [place] as good, fair or great? Good  Have some conditions;  Auto sarcoid of the lungs; DrMarland Kitchen Lenna Gilford  Had uveitis in 02; a few years later showed up in a chest xray; went through CAT scans and Dr. Lenna Gilford does CAT scans every year; 3/17 had the flu 08/2016 /Dr. Lenna Gilford is on Prednisone and still on it Dr. Yong Channel was going to call Dr. Lenna Gilford to inquire about getting the prevnar   Osteoporosis DEX 08/2014 - Have on scheduled for Monday to repeat   Mammogram x 76 yo and will schedule  Dr. Marvel Plan follows and has ordered both mammogram and your dexa  Tried fosamax and had digestive issues   Fallen arch in right foot; using orthotic    Preventive Screening -Counseling & Management   Smoking history 0- never smoked   Second Hand Smoke status; No Smokers in the home ETOH - occas wine  Medication adherence or issues?   RISK FACTORS Diet Does a lot of fresh cooking  Went on prednisone borderline DM A1c is 6.4  Is on prednisone and has been sick as well with chronic disease; acute illness last year and sarcoidosis  Educated on pre diabetes but BS may improve once off prednisone  Regular exercise  Keeping a tri-level home up Joined program for women x 3 days Exercise varies; zumba;  Doing some chair aerobics at least 2 days a week Step aerobics    Cardiac Risk Factors:  Advanced aged > 17 in men; >65 in women Hyperlipidemia - discussed HDL;  Diabetes pre diabetes; will monitor once well and prednisone stopped    Obesity neg; BMI 26  Fall risk  no Given education on "Fall Prevention in the Home" for more safety tips the patient can apply as appropriate.  Long term goal is to "age in place" or undecided   Mobility of Functional changes this year? Some with feet; working to keep arch and plans to see a foot doctor   Mental Health:  Any emotional problems? Anxious, depressed, irritable, sad or blue? no Denies feeling depressed or hopeless; voices pleasure in daily life How many social activities have you been engaged in within the last 2 weeks? no   Activities of Daily Living - See functional screen   Cognitive testing; Ad8 score; 0 or less than 2  MMSE deferred or completed if AD8 + 2 issues  Advanced Directives completed and will try to bring a copy   Patient Care Team: Marin Olp, MD as PCP - General (Family Medicine)   Immunization History  Administered Date(s) Administered  . Influenza Split 05/23/2011, 05/23/2012, 05/04/2013, 05/31/2014  . Influenza Whole 09/11/2009, 05/08/2010  . Influenza, High Dose Seasonal PF 05/02/2016  . Influenza-Unspecified 05/25/2015  . Pneumococcal Polysaccharide-23 05/17/2010  . Tdap 11/21/2011   Required Immunizations needed today  Screening test up to date or reviewed for plan of completion Health Maintenance Due  Topic Date Due  . FOOT EXAM  08/02/1950  . OPHTHALMOLOGY EXAM  08/02/1950  . URINE MICROALBUMIN  08/02/1950  . PNA vac Low  Risk Adult (2 of 2 - PCV13) 05/18/2011   Colonoscopy completed 12/2015 had 3 polyps and sister had colon cancer and has scheduled for 3 years 12/2018 Dr. Hilarie Fredrickson   Seeing foot doctor; going to a foot specialist   Will schedule eye exam soon and will have doctor send report here   Deferred Ua Albumin due to time   Educated regarding shingles (new vaccine)  As well as prevnar. Stated Dr. Yong Channel is checking with Dr. Lenna Gilford and she will not take any vaccines until she comes off of prednisone and sarcoid stabliizes  Cardiac Risk Factors  include: advanced age (>49men, >2 women)     Objective:    Vitals: BP (!) 144/64   Pulse 80   Ht 5' 8.5" (1.74 m)   Wt 178 lb 3.2 oz (80.8 kg)   SpO2 94%   BMI 26.70 kg/m   Body mass index is 26.7 kg/m.  Tobacco History  Smoking Status  . Never Smoker  Smokeless Tobacco  . Never Used     Counseling given: Yes   Past Medical History:  Diagnosis Date  . Adenomatous colon polyp    sessile  . Allergy    SEASONAL  . Anxiety   . Cataract    bilateral  . Colon polyps   . Diverticulosis of colon (without mention of hemorrhage)   . DJD (degenerative joint disease)   . Dysrhythmia    pac  . Esophagitis   . GERD (gastroesophageal reflux disease)   . Hiatal hernia   . Hypercholesterolemia   . IBS (irritable bowel syndrome)   . MVP (mitral valve prolapse)   . Osteopenia   . Personal history of colonic polyps    SESSILE SERRATED ADENOMAS (X2)  . PONV (postoperative nausea and vomiting)   . Sarcoidosis ([place])   . Shortness of breath dyspnea   . Stricture and stenosis of esophagus   . Unspecified gastritis and gastroduodenitis without mention of hemorrhage   . Urinary tract infection, site not specified   . Uveitis    Past Surgical History:  Procedure Laterality Date  . ABDOMINAL HYSTERECTOMY    . BASAL CELL CARCINOMA EXCISION    . CATARACT EXTRACTION Bilateral   . COLONOSCOPY  2012  . MASS EXCISION Left 11/02/2014   Procedure: EXCISION MASS LEFT ELBOW;  Surgeon: Daryll Brod, MD;  Location: [facility];  Service: Orthopedics;  Laterality: Left;  . POLYPECTOMY    . UPPER GASTROINTESTINAL ENDOSCOPY    . VESICOVAGINAL FISTULA CLOSURE W/ TAH     "sling" after hystrectomy per patient   Family History  Problem Relation Age of Onset  . Melanoma Mother     per pt melanoma   . Colon cancer Sister     sees Dr Henrene Pastor   . Colon polyps Brother   . Sarcoidosis Brother   . Colon polyps Sister     Dr stark   . Colon polyps Sister   . Endometrial cancer Sister    . Lung cancer Brother   . Other      nephew with Wegener's   . Stroke Sister    History  Sexual Activity  . Sexual activity: Not on file    Outpatient Encounter Prescriptions as of 10/26/2016  Medication Sig  . ALPRAZolam (XANAX) 0.25 MG tablet Take 1/2 to 1 tablet by mouth three times daily  . aspirin 81 MG tablet Take 81 mg by mouth daily.    . B Complex Vitamins (VITAMIN-B COMPLEX PO)  Take 1 capsule by mouth daily.   . Calcium Carbonate (CALTRATE 600 PO) Take 1 tablet by mouth 2 (two) times daily.    . cholecalciferol (VITAMIN D) 1000 units tablet Take 1,000 Units by mouth daily.  . Esomeprazole Magnesium (NEXIUM 24HR) 20 MG TBEC Take 1 tablet by mouth 2 (two) times daily.  . Glucosamine HCl (GLUCOSAMINE PO) Liquid form - Take by mouth as directed once daily  . meclizine (ANTIVERT) 25 MG tablet Take 1/2-1 tablet by mouth every 4 hours as needed for dizziness  . Multiple Vitamins-Minerals (MULTIVITAL) tablet Take 1 tablet by mouth daily.    . Omega-3 Fatty Acids (FISH OIL) 1000 MG CAPS Take 1 capsule by mouth daily.    . ondansetron (ZOFRAN) 8 MG tablet 1 tablet by mouth every 6 hours as needed for nausea  . predniSONE (DELTASONE) 5 MG tablet Take as directed  . simvastatin (ZOCOR) 40 MG tablet Take 1 tablet (40 mg total) by mouth at bedtime.  . sucralfate (CARAFATE) 1 g tablet Take 1 tablet (1 g total) by mouth 2 (two) times daily.   No facility-administered encounter medications on file as of 10/26/2016.     Activities of Daily Living In your present state of health, do you have any difficulty performing the following activities: 10/26/2016  Hearing? N  Vision? N  Difficulty concentrating or making decisions? N  Walking or climbing stairs? N  Dressing or bathing? N  Doing errands, shopping? N  Preparing Food and eating ? N  Using the Toilet? N  In the past six months, have you accidently leaked urine? N  Do you have problems with loss of bowel control? N  Managing your  Medications? N  Managing your Finances? N  Housekeeping or managing your Housekeeping? N  Some recent data might be hidden    Patient Care Team: Marin Olp, MD as PCP - General (Family Medicine)   Assessment:     Exercise Activities and Dietary recommendations Current Exercise Habits: Structured exercise class, Type of exercise: strength training/weights;Other - see comments, Time (Minutes): 45, Frequency (Times/Week): 3, Weekly Exercise (Minutes/Week): 135, Intensity: Moderate  Goals    . Exercise 150 minutes per week (moderate activity)          Continue your exercise  Continue to live with stress better;  Will consider taking time every week to do something w your hands  Play with floral designs;       Fall Risk Fall Risk  10/26/2016 10/18/2016 12/09/2013 12/02/2012  Falls in the past year? No No No Yes  Number falls in past yr: - - - 1  Injury with Fall? - - - Yes   Depression Screen PHQ 2/9 Scores 10/26/2016 10/18/2016 12/09/2013 12/02/2012  PHQ - 2 Score 0 0 0 0    Cognitive Function MMSE - Mini Mental State Exam 10/26/2016  Not completed: (No Data)    Ad8 score is 0     Immunization History  Administered Date(s) Administered  . Influenza Split 05/23/2011, 05/23/2012, 05/04/2013, 05/31/2014  . Influenza Whole 09/11/2009, 05/08/2010  . Influenza, High Dose Seasonal PF 05/02/2016  . Influenza-Unspecified 05/25/2015  . Pneumococcal Polysaccharide-23 05/17/2010  . Tdap 11/21/2011   Screening Tests Health Maintenance  Topic Date Due  . FOOT EXAM  08/02/1950  . OPHTHALMOLOGY EXAM  08/02/1950  . URINE MICROALBUMIN  08/02/1950  . PNA vac Low Risk Adult (2 of 2 - PCV13) 05/18/2011  . INFLUENZA VACCINE  02/20/2017  . HEMOGLOBIN A1C  02/25/2017  . COLONOSCOPY  01/17/2019  . TETANUS/TDAP  11/20/2021  . DEXA SCAN  Completed      Plan:      PCP Notes  Health Maintenance To have bone density on Monday Did refer to the osteoporosis foundation online site to  review meds and educated regarding prolia  To have eye exam soon and will send report to Dr. Yong Channel  Will bring a copy of AD  Will check with Dr. Lenna Gilford prior to taking prevnar or shingrix. Continues on titrating po prednisone   BP slightly elevated; Discussed checking a few times at home and to come back to Dr. Yong Channel if it stays elevated. Does mostly home cooking and watches the sodium in their diet.   To schedule her next mammogram Followed by Dr. Marvel Plan GYN   Colonoscopy q 3 years due to colon cancer in family   Left prior to getting mico-albumin; will defer for now   Abnormal Screens none  Referrals none  Patient concerns; right foot has dropped arch and is seeing specialist for treatment; ? Orthotics or other   Nurse Concerns; discussed relaxation and time for self   Next PCP apt Pulmonary on 5/8    During the course of the visit the patient was educated and counseled about the following appropriate screening and preventive services:   Vaccines to include Pneumoccal, Influenza, Hepatitis B, Td, Zostavax, HCV  Electrocardiogram  Cardiovascular Disease  Colorectal cancer screening  Diabetes screening  Prostate Cancer Screening  Glaucoma screening  Nutrition counseling   Smoking cessation counseling  Patient Instructions (the written plan) was given to the patient.    Wynetta Fines, RN  10/26/2016

## 2016-10-26 NOTE — Patient Instructions (Addendum)
[name] , Thank you for taking time to come for your Medicare Wellness Visit. I appreciate your ongoing commitment to your health goals. Please review the following plan we discussed and let me know if I can assist you in the future.   Calcium 1234m with Vit D 800u per day; more as directed by physician Strength building exercises discussed; can include walking; housework; small weights or stretch bands; silver sneakers if access to the [facility]  Try to review the osteoporosis foundation.org online Prolia; injectable sq every 6 months  Please have your eye doctor [place]  Will discuss Prevnar and new shingles vaccine with Dr. NLenna Gilfordor Dr. HYong Channel Can check you BP 3 times a week; one time in am, then in the afternoon or evening  Minimal Blood Pressure Goal= AVERAGE < 140/90; Ideal is an AVERAGE < 135/85. This AVERAGE should be calculated from @ least 5-7 BP readings taken @ different times of day on different days of week. You should not respond to isolated BP readings , but rather the AVERAGE for that week .Please bring your blood pressure cuff to office visits to verify that it is reliable.It can also be checked against the blood pressure device at the pharmacy. Finger or wrist cuffs are not dependable; an arm cuff is.  Also, monitor sodium intake in label of any canned food, as well as sodium in common everyday condiments; ketchup; salad dressing, sauces and any fast food restaurant.     These are the goals we discussed: Goals    None      This is a list of the screening recommended for you and due dates:  Health Maintenance  Topic Date Due  . Complete foot exam   08/02/1950  . Eye exam for diabetics  08/02/1950  . Urine Protein Check  08/02/1950  . Pneumonia vaccines (2 of 2 - PCV13) 05/18/2011  . Flu Shot  02/20/2017  . Hemoglobin A1C  02/25/2017  . Colon Cancer Screening  01/17/2019  . Tetanus Vaccine  11/20/2021  . DEXA scan (bone density measurement)   Completed        Diabetes and Foot Care Diabetes may cause you to have problems because of poor blood supply (circulation) to your feet and legs. This may cause the skin on your feet to become thinner, break easier, and heal more slowly. Your skin may become dry, and the skin may peel and crack. You may also have nerve damage in your legs and feet causing decreased feeling in them. You may not notice minor injuries to your feet that could lead to infections or more serious problems. Taking care of your feet is one of the most important things you can do for yourself. Follow these instructions at home:  Wear shoes at all times, even in the house. Do not go barefoot. Bare feet are easily injured.  Check your feet daily for blisters, cuts, and redness. If you cannot see the bottom of your feet, use a mirror or ask someone for help.  Wash your feet with warm water (do not use hot water) and mild soap. Then pat your feet and the areas between your toes until they are completely dry. Do not soak your feet as this can dry your skin.  Apply a moisturizing lotion or petroleum jelly (that does not contain alcohol and is unscented) to the skin on your feet and to dry, brittle toenails. Do not apply lotion between your toes.  Trim your  toenails straight across. Do not dig under them or around the cuticle. File the edges of your nails with an emery board or nail file.  Do not cut corns or calluses or try to remove them with medicine.  Wear clean socks or stockings every day. Make sure they are not too tight. Do not wear knee-high stockings since they may decrease blood flow to your legs.  Wear shoes that fit properly and have enough cushioning. To break in new shoes, wear them for just a few hours a day. This prevents you from injuring your feet. Always look in your shoes before you put them on to be sure there are no objects inside.  Do not cross your legs. This may decrease the blood flow to your  feet.  If you find a minor scrape, cut, or break in the skin on your feet, keep it and the skin around it clean and dry. These areas may be cleansed with mild soap and water. Do not cleanse the area with peroxide, alcohol, or iodine.  When you remove an adhesive bandage, be sure not to damage the skin around it.  If you have a wound, look at it several times a day to make sure it is healing.  Do not use heating pads or hot water bottles. They may burn your skin. If you have lost feeling in your feet or legs, you may not know it is happening until it is too late.  Make sure your health care provider performs a complete foot exam at least annually or more often if you have foot problems. Report any cuts, sores, or bruises to your health care provider immediately. Contact a health care provider if:  You have an injury that is not healing.  You have cuts or breaks in the skin.  You have an ingrown nail.  You notice redness on your legs or feet.  You feel burning or tingling in your legs or feet.  You have pain or cramps in your legs and feet.  Your legs or feet are numb.  Your feet always feel cold. Get help right away if:  There is increasing redness, swelling, or pain in or around a wound.  There is a red line that goes up your leg.  Pus is coming from a wound.  You develop a fever or as directed by your health care provider.  You notice a bad smell coming from an ulcer or wound. This information is not intended to replace advice given to you by your health care provider. Make sure you discuss any questions you have with your health care provider. Document Released: 07/06/2000 Document Revised: 12/15/2015 Document Reviewed: 12/16/2012 Elsevier Interactive Patient Education  2017 [place] Prevention in the Home Falls can cause injuries. They can happen to people of all ages. There are many things you can do to make your home safe and to help prevent falls. What  can I do on the outside of my home?  Regularly fix the edges of walkways and driveways and fix any cracks.  Remove anything that might make you trip as you walk through a door, such as a raised step or threshold.  Trim any bushes or trees on the path to your home.  Use bright outdoor lighting.  Clear any walking paths of anything that might make someone trip, such as rocks or tools.  Regularly check to see if handrails are loose or broken. Make sure that both sides of any  steps have handrails.  Any raised decks and porches should have guardrails on the edges.  Have any leaves, snow, or ice cleared regularly.  Use sand or salt on walking paths during winter.  Clean up any spills in your garage right away. This includes oil or grease spills. What can I do in the bathroom?  Use night lights.  Install grab bars by the toilet and in the tub and shower. Do not use towel bars as grab bars.  Use non-skid mats or decals in the tub or shower.  If you need to sit down in the shower, use a plastic, non-slip stool.  Keep the floor dry. Clean up any water that spills on the floor as soon as it happens.  Remove soap buildup in the tub or shower regularly.  Attach bath mats securely with double-sided non-slip rug tape.  Do not have throw rugs and other things on the floor that can make you trip. What can I do in the bedroom?  Use night lights.  Make sure that you have a light by your bed that is easy to reach.  Do not use any sheets or blankets that are too big for your bed. They should not hang down onto the floor.  Have a firm chair that has side arms. You can use this for support while you get dressed.  Do not have throw rugs and other things on the floor that can make you trip. What can I do in the kitchen?  Clean up any spills right away.  Avoid walking on wet floors.  Keep items that you use a lot in easy-to-reach places.  If you need to reach something above you, use a  strong step stool that has a grab bar.  Keep electrical cords out of the way.  Do not use floor polish or wax that makes floors slippery. If you must use wax, use non-skid floor wax.  Do not have throw rugs and other things on the floor that can make you trip. What can I do with my stairs?  Do not leave any items on the stairs.  Make sure that there are handrails on both sides of the stairs and use them. Fix handrails that are broken or loose. Make sure that handrails are as long as the stairways.  Check any carpeting to make sure that it is firmly attached to the stairs. Fix any carpet that is loose or worn.  Avoid having throw rugs at the top or bottom of the stairs. If you do have throw rugs, attach them to the floor with carpet tape.  Make sure that you have a light switch at the top of the stairs and the bottom of the stairs. If you do not have them, ask someone to add them for you. What else can I do to help prevent falls?  Wear shoes that:  Do not have high heels.  Have rubber bottoms.  Are comfortable and fit you well.  Are closed at the toe. Do not wear sandals.  If you use a stepladder:  Make sure that it is fully opened. Do not climb a closed stepladder.  Make sure that both sides of the stepladder are locked into place.  Ask someone to hold it for you, if possible.  Clearly mark and make sure that you can see:  Any grab bars or handrails.  First and last steps.  Where the edge of each step is.  Use tools that help you move around (  mobility aids) if they are needed. These include:  Canes.  Walkers.  Scooters.  Crutches.  Turn on the lights when you go into a dark area. Replace any light bulbs as soon as they burn out.  Set up your furniture so you have a clear path. Avoid moving your furniture around.  If any of your floors are uneven, fix them.  If there are any pets around you, be aware of where they are.  Review your medicines with your  doctor. Some medicines can make you feel dizzy. This can increase your chance of falling. Ask your doctor what other things that you can do to help prevent falls. This information is not intended to replace advice given to you by your health care provider. Make sure you discuss any questions you have with your health care provider. Document Released: 05/05/2009 Document Revised: 12/15/2015 Document Reviewed: 08/13/2014 Elsevier Interactive Patient Education  2017 [place] Maintenance, Female Adopting a healthy lifestyle and getting preventive care can go a long way to promote health and wellness. Talk with your health care provider about what schedule of regular examinations is right for you. This is a good chance for you to check in with your provider about disease prevention and staying healthy. In between checkups, there are plenty of things you can do on your own. Experts have done a lot of research about which lifestyle changes and preventive measures are most likely to keep you healthy. Ask your health care provider for more information. Weight and diet Eat a healthy diet  Be sure to include plenty of vegetables, fruits, low-fat dairy products, and lean protein.  Do not eat a lot of foods high in solid fats, added sugars, or salt.  Get regular exercise. This is one of the most important things you can do for your health.  Most adults should exercise for at least 150 minutes each week. The exercise should increase your heart rate and make you sweat (moderate-intensity exercise).  Most adults should also do strengthening exercises at least twice a week. This is in addition to the moderate-intensity exercise. Maintain a healthy weight  Body mass index (BMI) is a measurement that can be used to identify possible weight problems. It estimates body fat based on height and weight. Your health care provider can help determine your BMI and help you achieve or maintain a healthy  weight.  For females 82 years of age and older:  A BMI below 18.5 is considered underweight.  A BMI of 18.5 to 24.9 is normal.  A BMI of 25 to 29.9 is considered overweight.  A BMI of 30 and above is considered obese. Watch levels of cholesterol and blood lipids  You should start having your blood tested for lipids and cholesterol at 76 years of age, then have this test every 5 years.  You may need to have your cholesterol levels checked more often if:  Your lipid or cholesterol levels are high.  You are older than 76 years of age.  You are at high risk for heart disease. Cancer screening Lung Cancer  Lung cancer screening is recommended for adults 24-67 years old who are at high risk for lung cancer because of a history of smoking.  A yearly low-dose CT scan of the lungs is recommended for people who:  Currently smoke.  Have quit within the past 15 years.  Have at least a 30-pack-year history of smoking. A pack year is smoking an average of one pack  of cigarettes a day for 1 year.  Yearly screening should continue until it has been 15 years since you quit.  Yearly screening should stop if you develop a health problem that would prevent you from having lung cancer treatment. Breast Cancer  Practice breast self-awareness. This means understanding how your breasts normally appear and feel.  It also means doing regular breast self-exams. Let your health care provider know about any changes, no matter how small.  If you are in your 20s or 30s, you should have a clinical breast exam (CBE) by a health care provider every 1-3 years as part of a regular health exam.  If you are 92 or older, have a CBE every year. Also consider having a breast X-ray (mammogram) every year.  If you have a family history of breast cancer, talk to your health care provider about genetic screening.  If you are at high risk for breast cancer, talk to your health care provider about having an MRI  and a mammogram every year.  Breast cancer gene (BRCA) assessment is recommended for women who have family members with BRCA-related cancers. BRCA-related cancers include:  Breast.  Ovarian.  Tubal.  Peritoneal cancers.  Results of the assessment will determine the need for genetic counseling and BRCA1 and BRCA2 testing. Cervical Cancer  Your health care provider may recommend that you be screened regularly for cancer of the pelvic organs (ovaries, uterus, and vagina). This screening involves a pelvic examination, including checking for microscopic changes to the surface of your cervix (Pap test). You may be encouraged to have this screening done every 3 years, beginning at age 8.  For women ages 21-65, health care providers may recommend pelvic exams and Pap testing every 3 years, or they may recommend the Pap and pelvic exam, combined with testing for human papilloma virus (HPV), every 5 years. Some types of HPV increase your risk of cervical cancer. Testing for HPV may also be done on women of any age with unclear Pap test results.  Other health care providers may not recommend any screening for nonpregnant women who are considered low risk for pelvic cancer and who do not have symptoms. Ask your health care provider if a screening pelvic exam is right for you.  If you have had past treatment for cervical cancer or a condition that could lead to cancer, you need Pap tests and screening for cancer for at least 20 years after your treatment. If Pap tests have been discontinued, your risk factors (such as having a new sexual partner) need to be reassessed to determine if screening should resume. Some women have medical problems that increase the chance of getting cervical cancer. In these cases, your health care provider may recommend more frequent screening and Pap tests. Colorectal Cancer  This type of cancer can be detected and often prevented.  Routine colorectal cancer screening  usually begins at 76 years of age and continues through 76 years of age.  Your health care provider may recommend screening at an earlier age if you have risk factors for colon cancer.  Your health care provider may also recommend using home test kits to check for hidden blood in the stool.  A small camera at the end of a tube can be used to examine your colon directly (sigmoidoscopy or colonoscopy). This is done to check for the earliest forms of colorectal cancer.  Routine screening usually begins at age 34.  Direct examination of the colon should be repeated every 5-10  years through 76 years of age. However, you may need to be screened more often if early forms of precancerous polyps or small growths are found. Skin Cancer  Check your skin from head to toe regularly.  Tell your health care provider about any new moles or changes in moles, especially if there is a change in a mole's shape or color.  Also tell your health care provider if you have a mole that is larger than the size of a pencil eraser.  Always use sunscreen. Apply sunscreen liberally and repeatedly throughout the day.  Protect yourself by wearing long sleeves, pants, a wide-brimmed hat, and sunglasses whenever you are outside. Heart disease, diabetes, and high blood pressure  High blood pressure causes heart disease and increases the risk of stroke. High blood pressure is more likely to develop in:  People who have blood pressure in the high end of the normal range (130-139/85-89 mm Hg).  People who are overweight or obese.  People who are African American.  If you are 72-93 years of age, have your blood pressure checked every 3-5 years. If you are 4 years of age or older, have your blood pressure checked every year. You should have your blood pressure measured twice-once when you are at a hospital or clinic, and once when you are not at a hospital or clinic. Record the average of the two measurements. To check your  blood pressure when you are not at a hospital or clinic, you can use:  An automated blood pressure machine at a pharmacy.  A home blood pressure monitor.  If you are between 26 years and 27 years old, ask your health care provider if you should take aspirin to prevent strokes.  Have regular diabetes screenings. This involves taking a blood sample to check your fasting blood sugar level.  If you are at a normal weight and have a low risk for diabetes, have this test once every three years after 76 years of age.  If you are overweight and have a high risk for diabetes, consider being tested at a younger age or more often. Preventing infection Hepatitis B  If you have a higher risk for hepatitis B, you should be screened for this virus. You are considered at high risk for hepatitis B if:  You were born in a country where hepatitis B is common. Ask your health care provider which countries are considered high risk.  Your parents were born in a high-risk country, and you have not been immunized against hepatitis B (hepatitis B vaccine).  You have HIV or AIDS.  You use needles to inject street drugs.  You live with someone who has hepatitis B.  You have had sex with someone who has hepatitis B.  You get hemodialysis treatment.  You take certain medicines for conditions, including cancer, organ transplantation, and autoimmune conditions. Hepatitis C  Blood testing is recommended for:  Everyone born from [birth year] through 1965.  Anyone with known risk factors for hepatitis C. Sexually transmitted infections (STIs)  You should be screened for sexually transmitted infections (STIs) including gonorrhea and chlamydia if:  You are sexually active and are younger than 76 years of age.  You are older than 76 years of age and your health care provider tells you that you are at risk for this type of infection.  Your sexual activity has changed since you were last screened and you are at an  increased risk for chlamydia or gonorrhea. Ask your health care  provider if you are at risk.  If you do not have HIV, but are at risk, it may be recommended that you take a prescription medicine daily to prevent HIV infection. This is called pre-exposure prophylaxis (PrEP). You are considered at risk if:  You are sexually active and do not regularly use condoms or know the HIV status of your partner(s).  You take drugs by injection.  You are sexually active with a partner who has HIV. Talk with your health care provider about whether you are at high risk of being infected with HIV. If you choose to begin PrEP, you should first be tested for HIV. You should then be tested every 3 months for as long as you are taking PrEP. Pregnancy  If you are premenopausal and you may become pregnant, ask your health care provider about preconception counseling.  If you may become pregnant, take 400 to 800 micrograms (mcg) of folic acid every day.  If you want to prevent pregnancy, talk to your health care provider about birth control (contraception). Osteoporosis and menopause  Osteoporosis is a disease in which the bones lose minerals and strength with aging. This can result in serious bone fractures. Your risk for osteoporosis can be identified using a bone density scan.  If you are 73 years of age or older, or if you are at risk for osteoporosis and fractures, ask your health care provider if you should be screened.  Ask your health care provider whether you should take a calcium or vitamin D supplement to lower your risk for osteoporosis.  Menopause may have certain physical symptoms and risks.  Hormone replacement therapy may reduce some of these symptoms and risks. Talk to your health care provider about whether hormone replacement therapy is right for you. Follow these instructions at home:  Schedule regular health, dental, and eye exams.  Stay current with your immunizations.  Do not use  any tobacco products including cigarettes, chewing tobacco, or electronic cigarettes.  If you are pregnant, do not drink alcohol.  If you are breastfeeding, limit how much and how often you drink alcohol.  Limit alcohol intake to no more than 1 drink per day for nonpregnant women. One drink equals 12 ounces of beer, 5 ounces of wine, or 1 ounces of hard liquor.  Do not use street drugs.  Do not share needles.  Ask your health care provider for help if you need support or information about quitting drugs.  Tell your health care provider if you often feel depressed.  Tell your health care provider if you have ever been abused or do not feel safe at home. This information is not intended to replace advice given to you by your health care provider. Make sure you discuss any questions you have with your health care provider. Document Released: 01/22/2011 Document Revised: 12/15/2015 Document Reviewed: 04/12/2015 Elsevier Interactive Patient Education  2017 [employer].

## 2016-10-26 NOTE — Progress Notes (Signed)
I have reviewed and agree with note, evaluation, plan.   Paticia Moster, MD  

## 2016-10-29 ENCOUNTER — Ambulatory Visit (INDEPENDENT_AMBULATORY_CARE_PROVIDER_SITE_OTHER)
Admission: RE | Admit: 2016-10-29 | Discharge: 2016-10-29 | Disposition: A | Discharge status: Home / Self Care | Payer: Medicare Other | Source: Ambulatory Visit | Attending: Family Medicine | Admitting: Family Medicine

## 2016-10-29 DIAGNOSIS — M816 Localized osteoporosis [Lequesne]: Secondary | ICD-10-CM

## 2016-11-01 ENCOUNTER — Other Ambulatory Visit: Payer: Self-pay

## 2016-11-01 DIAGNOSIS — M810 Age-related osteoporosis without current pathological fracture: Secondary | ICD-10-CM

## 2016-11-19 ENCOUNTER — Encounter: Payer: Self-pay | Admitting: Endocrinology

## 2016-11-19 ENCOUNTER — Ambulatory Visit (INDEPENDENT_AMBULATORY_CARE_PROVIDER_SITE_OTHER): Payer: Medicare Other | Admitting: Endocrinology

## 2016-11-19 VITALS — BP 136/70 | HR 81 | Ht 69.0 in | Wt 174.0 lb

## 2016-11-19 DIAGNOSIS — S32010S Wedge compression fracture of first lumbar vertebra, sequela: Secondary | ICD-10-CM

## 2016-11-19 DIAGNOSIS — M816 Localized osteoporosis [Lequesne]: Secondary | ICD-10-CM

## 2016-11-19 LAB — TSH: TSH: 0.79 u[IU]/mL (ref 0.35–4.50)

## 2016-11-19 LAB — VITAMIN D 25 HYDROXY (VIT D DEFICIENCY, FRACTURES): VITD: 68.78 ng/mL (ref 30.00–100.00)

## 2016-11-19 NOTE — Progress Notes (Signed)
Subjective:    Patient ID: Laura [name], female    DOB: March 25, 1941, 76 y.o.   MRN: [id number]  HPI Pt is referred by Dr Yong Channel, for osteoporosis. In 2014, pt had L1 compression fx s/p fall s/p kyphoplasty at [facility]. Pt was noted to have osteoporosis in 2016, and was rx'ed fosamax.  After 2 months, she stopped, due to moderate arthralgias throughout the body, and assoc nausea.  She has never been on any other medication for this.  she has never had any other bony fracture.  She has no history of any of the following: early menopause, multiple myeloma, renal dz, thyroid problems, prolonged bedrest, steroids, smoking, liver dz, vid-d deficiency, and primary hyperparathyroidism.  She does not take heparin or anticonvulsants.  She takes intermitt prednisone, due to pulm sarcoidosis.   She takes Ca++, and vit-D, uncertain dosage.   Past Medical History:  Diagnosis Date  . Adenomatous colon polyp    sessile  . Allergy    SEASONAL  . Anxiety   . Cataract    bilateral  . Colon polyps   . Diverticulosis of colon (without mention of hemorrhage)   . DJD (degenerative joint disease)   . Dysrhythmia    pac  . Esophagitis   . GERD (gastroesophageal reflux disease)   . Hiatal hernia   . Hypercholesterolemia   . IBS (irritable bowel syndrome)   . MVP (mitral valve prolapse)   . Osteopenia   . Personal history of colonic polyps    SESSILE SERRATED ADENOMAS (X2)  . PONV (postoperative nausea and vomiting)   . Sarcoidosis   . Shortness of breath dyspnea   . Stricture and stenosis of esophagus   . Unspecified gastritis and gastroduodenitis without mention of hemorrhage   . Urinary tract infection, site not specified   . Uveitis     Past Surgical History:  Procedure Laterality Date  . ABDOMINAL HYSTERECTOMY    . BASAL CELL CARCINOMA EXCISION    . CATARACT EXTRACTION Bilateral   . COLONOSCOPY  2012  . MASS EXCISION Left 11/02/2014   Procedure: EXCISION MASS LEFT ELBOW;  Surgeon:  Daryll Brod, MD;  Location: [place];  Service: Orthopedics;  Laterality: Left;  . POLYPECTOMY    . UPPER GASTROINTESTINAL ENDOSCOPY    . VESICOVAGINAL FISTULA CLOSURE W/ TAH     "sling" after hystrectomy per patient    Social History   Social History  . Marital status: Married    Spouse name: Markan Cazarez  . Number of children: 2  . Years of education: N/A   Occupational History  . Retired     & Part Time Artist   Social History Main Topics  . Smoking status: Never Smoker  . Smokeless tobacco: Never Used  . Alcohol use 0.6 oz/week    1 Glasses of wine per week     Comment: occ wine   . Drug use: No  . Sexual activity: Not on file   Other Topics Concern  . Not on file   Social History Narrative   Married- husband with parkinsons. Will be patient of Dr. Yong Channel   Needs time to care for husband      Retired Artist (until 2017), bookkeeping prior      Hobbies: cooking, florist work, Barrister's clerk    Current Outpatient Prescriptions on File Prior to Visit  Medication Sig Dispense Refill  . ALPRAZolam (XANAX) 0.25 MG tablet Take 1/2 to 1 tablet by mouth  three times daily 90 tablet 5  . aspirin 81 MG tablet Take 81 mg by mouth daily.      . B Complex Vitamins (VITAMIN-B COMPLEX PO) Take 1 capsule by mouth daily.     . Calcium Carbonate (CALTRATE 600 PO) Take 1 tablet by mouth 2 (two) times daily.      . cholecalciferol (VITAMIN D) 1000 units tablet Take 1,000 Units by mouth daily.    . Esomeprazole Magnesium (NEXIUM 24HR) 20 MG TBEC Take 1 tablet by mouth 2 (two) times daily.    . Glucosamine HCl (GLUCOSAMINE PO) Liquid form - Take by mouth as directed once daily    . meclizine (ANTIVERT) 25 MG tablet Take 1/2-1 tablet by mouth every 4 hours as needed for dizziness    . Multiple Vitamins-Minerals (MULTIVITAL) tablet Take 1 tablet by mouth daily.      . Omega-3 Fatty Acids (FISH OIL) 1000 MG CAPS Take 1 capsule by mouth daily.      .  ondansetron (ZOFRAN) 8 MG tablet 1 tablet by mouth every 6 hours as needed for nausea 25 tablet 5  . predniSONE (DELTASONE) 5 MG tablet Take as directed 50 tablet 1  . simvastatin (ZOCOR) 40 MG tablet Take 1 tablet (40 mg total) by mouth at bedtime. 90 tablet 2  . sucralfate (CARAFATE) 1 g tablet Take 1 tablet (1 g total) by mouth 2 (two) times daily. 60 tablet 2   No current facility-administered medications on file prior to visit.     Allergies  Allergen Reactions  . 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 Amoxicillin-Pot Clavulanate     REACTION: causes diarrhea  . Codeine     REACTION: nausea  . Levaquin [Levofloxacin In D5w]     Had itching w/ intravenous, not sure if reaction was from abx or the need to change the IV.  Does well when taken with Benadryl. Pt states can take orally with no issues   . Tdap [Diphth-Acell Pertussis-Tetanus]     Area raised, warm to touch, tender, swollen, pt felt jittery, nausea and some SOB  . Tape Rash    Blisters skin and removes skin --NEEDS PAPER TAPE    Family History  Problem Relation Age of Onset  . Melanoma Mother     per pt melanoma   . Colon cancer Sister     sees Dr Henrene Pastor   . Colon polyps Brother   . Sarcoidosis Brother   . Colon polyps Sister     Dr stark   . Colon polyps Sister   . Endometrial cancer Sister   . Lung cancer Brother   . Other      nephew with Wegener's   . Stroke Sister   . Osteoporosis Neg Hx     BP 136/70   Pulse 81   Ht _0  (1.753 m)   Wt 174 lb (78.9 kg)   SpO2 97%   BMI 25.70 kg/m   Review of Systems denies weight loss, hematuria, heartburn, cold intolerance, edema, skin rash, insomnia, falls, cramps, memory loss, back pain, easy bruising, and rhinorrhea.      Objective:   Physical Exam VS: see vs page GEN: no distress HEAD: head: no deformity eyes: no periorbital swelling, no proptosis external nose  and ears are normal mouth: no lesion seen NECK: supple, thyroid is not enlarged CHEST  WALL: no deformity.  No kyphosis.   LUNGS: clear to auscultation CV: reg rate and rhythm, no murmur ABD: abdomen is soft, nontender.  no hepatosplenomegaly.  not distended.  no hernia MUSCULOSKELETAL: muscle bulk and strength are grossly normal.  no obvious joint swelling.  gait is normal and steady EXTEMITIES: no deformity.  Trace bilat leg edema PULSES: no carotid bruit NEURO:  cn 2-12 grossly intact.   readily moves all 4's.  sensation is intact to touch on all 4's SKIN:  Normal texture and temperature.  No rash or suspicious lesion is visible.   NODES:  None palpable at the neck.   PSYCH: alert, well-oriented.  Does not appear anxious nor depressed.    Lab Results  Component Value Date   CALCIUM 9.4 08/28/2016    Lumbar spine L2-L4 (L1) Femoral neck (FN)  T-score -1.5  RFN:-1.7 LFN:-1.2   Change in BMD from previous DXA test (%) +3.6%* +0.8%   (*) statistically significant  Assessment: the BMD is low according to the [facility] classification for osteoporosis (see below). Fracture risk: moderate FRAX score: 10 year major osteoporotic risk: 27.9%. 10 year hip fracture risk: 7.0%. These are above the thresholds for treatment of 20% and 3%, respectively.  Lab Results  Component Value Date   CALCIUM 9.4 08/28/2016   I have reviewed outside records, and summarized: Pt was noted to have osteoporosis, and referred here.  Diet and exercise were advised for DM.     Assessment & Plan:  Osteoporosis, new to me.  She needs increased rx.   Patient Instructions  It is critically important to prevent falling down (keep floor areas well-lit, dry, and free of loose objects.  If you have a cane, walker, or wheelchair, you should use it, even for short trips around the house.  Wear flat-soled shoes.  Also, try not to rush). Take calcium at least 1200 mg per day, and vitamin-D, at least 400 units per day blood  tests are requested for you today.  We'll let you know about the results. When we know the results, I would probably recommend a twice a year injection called "Prolia." Please return in 1 year.

## 2016-11-19 NOTE — Patient Instructions (Addendum)
It is critically important to prevent falling down (keep floor areas well-lit, dry, and free of loose objects.  If you have a cane, walker, or wheelchair, you should use it, even for short trips around the house.  Wear flat-soled shoes.  Also, try not to rush). Take calcium at least 1200 mg per day, and vitamin-D, at least 400 units per day blood tests are requested for you today.  We'll let you know about the results. When we know the results, I would probably recommend a twice a year injection called "Prolia." Please return in 1 year.

## 2016-11-20 LAB — PROTEIN ELECTROPHORESIS, SERUM
ALBUMIN ELP: 3.9 g/dL (ref 3.8–4.8)
ALPHA-2-GLOBULIN: 0.7 g/dL (ref 0.5–0.9)
Alpha-1-Globulin: 0.3 g/dL (ref 0.2–0.3)
BETA 2: 0.3 g/dL (ref 0.2–0.5)
BETA GLOBULIN: 0.4 g/dL (ref 0.4–0.6)
Gamma Globulin: 0.9 g/dL (ref 0.8–1.7)
Total Protein, Serum Electrophoresis: 6.5 g/dL (ref 6.1–8.1)

## 2016-11-20 LAB — PTH, INTACT AND CALCIUM
Calcium: 9.2 mg/dL (ref 8.6–10.4)
PTH: 25 pg/mL (ref 14–64)

## 2016-11-22 ENCOUNTER — Telehealth: Payer: Self-pay | Admitting: *Deleted

## 2016-11-22 NOTE — Telephone Encounter (Signed)
Information has been submitted to pts insurance for verification of benefits. Awaiting response for coverage  

## 2016-11-27 ENCOUNTER — Ambulatory Visit (INDEPENDENT_AMBULATORY_CARE_PROVIDER_SITE_OTHER): Payer: Medicare Other | Admitting: Pulmonary Disease

## 2016-11-27 ENCOUNTER — Encounter: Payer: Self-pay | Admitting: Pulmonary Disease

## 2016-11-27 VITALS — BP 134/72 | HR 77 | Temp 97.7°F | Ht 68.0 in | Wt 175.0 lb

## 2016-11-27 DIAGNOSIS — D869 Sarcoidosis, unspecified: Secondary | ICD-10-CM

## 2016-11-27 DIAGNOSIS — M8080XD Other osteoporosis with current pathological fracture, unspecified site, subsequent encounter for fracture with routine healing: Secondary | ICD-10-CM | POA: Diagnosis not present

## 2016-11-27 DIAGNOSIS — S32010D Wedge compression fracture of first lumbar vertebra, subsequent encounter for fracture with routine healing: Secondary | ICD-10-CM

## 2016-11-27 MED ORDER — PREDNISONE 5 MG PO TABS: 5.0000 mg | ORAL_TABLET | Freq: Every day | ORAL | 1 refills | Status: DC

## 2016-11-27 NOTE — Patient Instructions (Signed)
Today we updated your med list in our EPIC system...    Continue your current medications the same...  We discussed decreasing your PREDNISONE to 5mg  - one tab each AM...  We also discussed the PROLIA osteoporosis medication that DrElison has recommended...  Call for any questions...  Let's plan a follow up visit in 3-68mo, sooner if needed for problems.Marland KitchenMarland Kitchen

## 2016-11-27 NOTE — Progress Notes (Signed)
Subjective:    Patient ID: Laura [name], female    DOB: [birth year], 76 y.o.   MRN: [id number]  HPI 76 y/o WF here for a follow up visit... she has prob Sarcoidosis w/ hx Uveitis and BHA on CXR => Pred therapy started 12/2015 due to progressive CXT/ CT abnormality...   ~  SEE PREV EPIC NOTES FOR OLDER DATA >>   LABS 5/13:  FLP- at goals on Simva40;  Chems- wnl;  CBC- wnl;  TSH=1.61;  VitD=47  ADDENDUM>> she received TDAP w/ local reaction req antihist & topical Rx...  CXR 11/13 showed similar changes- irreg nodular changes in right suprahilar region, scarring, stable adenopathy, mild atherosclerotic calcif, osteopenia...  ~  Laura [name] reports that in Feb2014 she fell while getting up at night & hit her back w/ severe pain & L1 compression fx- adm [facility] 2d w/ kyphoplasty & told to have LLLpneumonia treated w/ Levaquin.   CXR 3/14 showed normal heart size, atherosclerotic Ao, chr scarring from underlying sarcoid, NAD...  LABS 5/14:  FLP- at goals on Simva40;  Chems- wnl;  CBC- wnl;  TSH=1.74;  VitD=52...  ~  Dec 09, 2013:  33moROV & Laura [name] saw TP several months ago c/o dyspnea and fatigue> she's been sedentary due to foot problem & when she tried to walk she noted trouble w/ hills, no CP, etc; hx sarcoid but she has never required treatment as it has been felt to be inactive- f/u CXR unchanged; felt to likely be deconditioning but referred to Cards for their eval as well...    Seen by Laura [name] for CTribune Company2DEcho showed norm LVF, norm valves, no signs of pulmHTN; subdeq stress testing showed extreme dyspnea w/ exercise (poor functional capacity), no ischemia; she has been encouraged to join silver sneakers and incr her exercise program...     Seen by Laura [name] for GI> c/o epig discomfort, hx GERD/ IBS/ colon polyps; symptoms improved by incr Nexium to Bid & adding Carafate prn; she is up to date on screening...  We reviewed prob list, meds, xrays and labs> see below for updates  >>   LABS 1/15:  Chems- ok x K=3.4;  CBC- wnl;  TSH=2.28;  Mag=2.0..Marland KitchenMarland Kitchen CXR 1/15 showed norm heart size, areas of scarring appear unchanged, prom hilae w/o change- stable, NAD..Marland KitchenMarland Kitchen  EKG 3/15 showed NSR, rate79, wnl, NAD...  Stress Test 3/15 showed poor exercise capacity, no ischemia...  2DEcho 3/15 showed normal LVF w/ EF=55-60%, no regional wall motion abn, normal valves, no evid for pulmHTN...  PFTs 5/15 showed just some sm airways dis> FVC=2.65 (78%), FEV1=1.86 (72%); %1sec=70; mid-flows=55% predicted... After bronchodil her FEV1 improved 6%... Lung Volumes & DLCO are wnl...   LABS 5/15:  ACE level = 55     ~  June 07, 2014:  683moOV & Laura [name] is improved> she has been exercising at home 2-3d/wk & [facility] the [facility] 2d/wk; she notes less dyspnea w/ walking, on hills, etc...  She saw Laura [name] 6/15- Hx GERD w/ esoph, IBS, adenomatous colon polyps; on Nexium40/d and Carafate 1tmQhs and doing well w/o breakthrough symptoms...  We reviewed the following medical problems during today's office visit >>     Sarcoid> Hx abn CXR, she is asymptomatic; baseline CXR w/ right suprahilar nodule, adenop, scarring & chr changes; ?LLLpneum 2[place]/09[place]/ [place]; last CXR 1/15 was stable and last ACE=55 ...    MVP> on ASA81;  She denies CP, palpit, SOB, etc; 2DEcho 3/15  was neg...     Chol> on Simva40 & FishOil; last FLP 11/15 showed TChol 169, TG 135, HDL 41, LDL 101; reviewed diet & wt reduction...    GI> GERD, IBS, Polyp> on Nexium40, Carafate1GmQhs, & Zofran prn; she remains asymptomatic w/o abd pain, n/v, c/d, blood seen...    UTIs> she reports no prob on Cranberry juice...    DJD, LBP, L1 compression w/ kyphoplasty, Osteop> on calium, MVI, VitD & OTC analgesics; she fell 2/14 w/ L1 compression & kyphoplasty in [place]; she is ready for f/u BMD=> pending...    Anxiety> she has a lot of family support, doesn't require meds... We reviewed prob list, meds, xrays and labs> see below for updates >>  she had the 2015 Flu shot recently, she is in need of the Prevnar-13 vaccine but wants to wait...   LABS 11/15:  FLP- looks good on Simva40, needs to lose wt;  Chems- wnl;  CBC- wnl;  TSH=2.19;  VitD=52...  ~  Dec 06, 2014:  35moRDixmoorhad left elbow surg for a nodule & excision by DMelrose Nakayamashowed path=numerous nonnecrotizing granulomas, spec stains neg & this is c/w an unusual manifestation of her Sarcoid; she was told "scar tissue" and is improved now... We reviewed the following medical problems during today's office visit >>     Sarcoid> Hx abn CXR, she is asymptomatic; baseline CXR w/ right suprahilar nodule, adenop, scarring & chr changes; [place]; last CXR 1/15 was stable and last ACE=55; she had nodule excised from left elbow 4/16 by Laura [name]=> granulomatous nodule c/w unusual manifewstation of Sarcoid, all spec stains were neg...    HxMVP, palpit w/ PVCs & PACs on Holter> on ASA81;  She denies CP, palpit, SOB now; advised no caffeine & avoid stress!    Chol> on Simva40 & FishOil; last FLP 11/15 showed TChol 169, TG 135, HDL 41, LDL 101; reviewed diet & wt reduction...    GI> GERD, IBS, Polyp> on Prilosec40Bid, Carafate1GmQhs, & Zofran prn; followed by Laura [name]- she remains asymptomatic w/o abd pain, n/v, c/d, blood seen...    UTIs> she reports no prob on Cranberry juice but had one recurrent UTI 4/16- Cipro cqalled in & symptoms resolved....    DJD, LBP, L1 compression w/ kyphoplasty, Osteop> on calcium, MVI, VitD & Tramadol50; [place] w/ Tscore-1.8 & we rec ALENDRONATE70/wk...    Anxiety> she has a lot of family support, doesn't require meds... We reviewed prob list, meds, xrays and labs> see below for updates >>   CXR 5/16 showed stable BHA & scarring on right, NAD, no change...  LABS 5/16:  ACE=56, stable...  ~  June 08, 2015:  631moOGoliadad f/u Laura [name] recently- GERD, esoph  spasm, IBS, colon polyps & +FamHx colon ca in sister> Rx w/ Nexium20Bid, Carafate prn, Cardizem30 prn esoph spasm and she is improved ;  She also had f/u Cards Laura [name] 12/23/14- palpit w/ PVCs and PACs, improved and rec to f/u prn... Note that she is off prev Fosamax Rx- took it for 71m371mostopped due to reflux- offered Binoso but she prefers off Rx (BMD 2/16- Tscore -1.8) & she takes Ca & VitD supplement... Prob list as above> EXAM shows Afeb, VSS, O2sat=95% on RA;  HEENT- neg, Mallampati1;  Chest- clear w/o w/r/r;  Heart- RR w/o m/r/g;  Abd- soft, nontender, neg;  Ext- neg w/o c/c/e;  Neuro- intact  w/o focal abn...  LABS 06/07/16>  FLP- all parameters at goals on Simva40;  Chems- wnl;  CBC- wnl;  TSH=1.86;  VitD=52;  ACE=63   IMP/PLAN>>  Jashiya is stable on current regimen- continue same meds and f/u in 24mo..  ~  December 26, 2015:  6-769moONappaneeemains asymptomatic, feeling well, and denies CP/ palpit. Cough/ phlegm/ hemoptysis, SOB, edema, etc;  She tells me that she got the Flu at the end of March noting HA, sore throat, severe cough w/o much sput, low grade temp, aching & sore, etc & was seen in the ER where CXR showed bilat hilar adenopathy & increased curvilinear thickening on the right but no acute process;  Flu panel was POS for TypeA flu;  She had f/u OV w/ TP shortly thereafter & given ZPak, Tessalon, Mucinex, Delsym, fluids;  She notes that it took her 8wks to fully resolve & get her energy back!  We reviewed the following medical problems during today's office visit >>     Ophthalmology> Hx uveitis, [facility]p [facility]    Sarcoid> Hx uveitis & abn CXR w/ BHA, she is asymptomatic; baseline CXR w/ adenop, scarring & chr changes; hx ?[place]neum [place]/09[place]/ [place]; last CXR 5/16 was stable and last ACE=63; she had nodule excised from left elbow 4/16 by Laura [name]=> granulomatous nodule c/w unusual manifestation of Sarcoid, all spec stains were neg...    HxMVP, palpit  w/ PVCs & PACs on Holter> on ASA81;  She denies CP, palpit, SOB now; advised no caffeine & avoid stress!    Chol> on Simva40 & FishOil; last FLP 11/15 showed TChol 169, TG 135, HDL 41, LDL 101; reviewed diet & wt reduction...    GI> GERD, IBS, Polyp> on Prilosec40Bid, Carafate1GmQhs, & Zofran prn; followed by Laura [name]- she remains asymptomatic w/o abd pain, n/v, c/d, blood seen...       ADDENDUM> she had colonoscopy 01/17/16> + for divertics, hemorrhoids, and several polyps- 4 to 51m29mize & Path= sessile serrated adenomas & f/u suggested 3 yrs.    UTIs> she reports no prob on Cranberry juice but had one recurrent UTI 4/16- Cipro cqalled in & symptoms resolved....    DJD, LBP, L1 compression w/ kyphoplasty, Osteop> on calcium, MVI, VitD & Tramadol50; she [place] 2/14 [place]/ [place] w/ Tscore-1.8 & we rec ALENDRONATE70/wk...    Anxiety> she has a lot of family support, doesn't require meds... EXAM shows Afeb, VSS, O2sat=97% on RA;  HEENT- neg, Mallampati1;  Chest- clear w/o w/r/r;  Heart- RR w/o m/r/g;  Abd- soft, nontender, neg;  Ext- neg w/o c/c/e;  Neuro- intact w/o focal abn...  CXR 10/16/15> I have reviewed this film & compared to prev images>  bilat hilar adenopathy & increased opac in left superior hilum and right lat hilar region w/ extension into ant RUL (similar to 11/2014 films but progressed from previous CXRs) => we will proceed w/ f/u CT Chest...  LABS 10/16/15>  Flu panel showed POS FluA;  Neg FluB and H1N1...   LABS 12/2015>  ACE=62 (nl=8-52);  VitD=51;  BMet- ok x BS=131, Cr=0.74, Ca=9.4... Marland KitchenMarland KitchenT Chest w/ contrast 01/06/16>  Norm heart size, aortic & coronary atherosclerosis, part calcif mediastinal adenopathy (unchanged from 2009), progression of bilat hilar adenopathy & pulm parenchymal changes w/ mass-like density in RML~4cm, and mass-like density in ant LUL ~4x2cm -- radiology feels this is c/w sarcoid progression.   Full PFTs  01/06/16>  FVC=2.66 (80%),  FEV1=1.82 (72%), %1sec=68, mid-flows rediced at 53% predicted; post bronchodil FEV1 improved 5% to 1.92;  TLC=5.57 (98%), RV=2.72 (108%), RV/TLC=49%;  DLCO=70% pred (DL/VA=96%). IMP/PLAN>>  Concern for active sarcoidosis w/ the recent CXR and CT changes; she has never had a biopsy due to remote presentation w/ BHA and uveitis and the fact that she has been devoid of resp symptoms, and prev had neg ACE levels (ie- no signs of dis activity);  Her recent CXR changes (progression), sl elev ACE in the low 60s, suggests some activity yet she remains stoic & asymptomatic;  She would like to proceed w/o lung Bx if poss & I discussed trial PREDNISONE w/ short term f/u to check ACE & CXR (if not improved then we will address need for lung bx at that time);  REC to start Pred40/d x2wks, 30/d x2wks, then 20/d til return in about 6wks time...   ~  February 22, 2016:  71moROV & f/u sarcoid> In Jun2017 we did CTChest showing norm heart size, aortic & coronary atherosclerosis, part calcif mediastinal adenopathy (unchanged from 2009), progression of bilat hilar adenopathy & pulm parenchymal changes w/ mass-like density in RML~4cm, and mass-like density in ant LUL ~4x2cm -- radiology feels this is c/w sarcoid progression;  ACE level= 62;  We discussed poss Bx but elected to start Pred therapy w/ 40-30-20 Q2wks and she returns today on Pred20/d noting various symptoms related to the Pred she feels eg- Ha, sweating, trembling, incr HR, & short tempered; these symptoms have diminished w/ decr in the dose; she also notes SOB, can't get a DB, & denies CP- but she has stepped up her walking program & doing better now;  We discussed checking ACE level & weaning Pred further based on this result...    EXAM shows Afeb, VSS, O2sat=97% on RA;  HEENT- neg, Mallampati1;  Chest- clear w/o w/r/r;  Heart- RR w/o m/r/g;  Abd- soft, nontender, neg;  Ext- neg w/o c/c/e;  Neuro- intact w/o focal abn...  LABS 02/2016>  BMet- ok w/ BS=100, A1c= 6.7,  Cr=0.81, K=3.9;  ACE=23... IMP/PLAN>>  LKatheeseems somewhat improved on the Pred & ACE is down to 23;  I suggest that she keep the Pred20/d for another 2wks then cut it to 257malt w/ 1024mod til return in 6 wks time...  ~  April 04, 2016:  6wk ROV & f/u sarcoid>  She reports stable on the Pred 20-10 Qod for the past month & doing better on the lower doses, BS better & wt stable at 170#- we reviewed low carb, low sodium diet;  She denies CP, SOB, cough, sput, hemoptysis, etc...    EXAM shows Afeb, VSS, O2sat=97% on RA;  HEENT- neg, Mallampati1;  Chest- clear w/o w/r/r;  Heart- RR w/o m/r/g;  Abd- soft, nontender, neg;  Ext- neg w/o c/c/e;  Neuro- intact w/o focal abn...  CXR 04/04/16>  Stable heart size, mild atherosclerosis of Ao, BHA & perihilar opacities sl decreased from prev films, no new airspace dis... IMP/PLAN>>  We decided to continue the Pred 100m27mt w/ 10mg74m thru Oct & then decr to 10mg/49marting in Nov until her f/u visit in 21mo w/18mo CXR & Labs...  ~  June 26, 2016:  21mo ROV88moois returns on Pred 10mg/d f10mhe last month- she is still noting mult somatic complaints and blames the Pred=> we discussed change to MEDROL 8mg tabs 27msee if she tolerates this better;  she tells me she's been exercising 3d/wk at the gym & doing satis, no cough/ sputum, and dyspnea improving; she notes occas palpit "fluttering" and more aware of heart beat- I have tried to get her to take the Alpraz0.20m tabs at 1/2 to1 tab Tid more regularly; we will recheck labs today, & she will need f/u CT Chest in early 2018 (16yr/u scan)...    EXAM shows Afeb, VSS, O2sat=97% on RA;  HEENT- neg, Mallampati1;  Chest- clear w/o w/r/r;  Heart- RR w/o m/r/g;  Abd- soft, nontender, neg;  Ext- neg w/o c/c/e;  Neuro- intact w/o focal abn...  LABS 06/26/16> Chems- ok x BS=117;  ACE=28 IMP/PLAN>>  LoJosueerceives some difficulty w/ the Pred but she is improved overall & ACE down to 28; we decided to change to MEDROL 37m40mtabs takingone Qam for Dec & cut to 37mg30mternating w/ 4mg 12m starting Jan2018=> we will recheck her in Feb w/ f/u CTChest due and also due for 22yr r46yrck BMD... Note: >50% of this 15min 14mt was spent in counseling and coordination of care.  ~  August 28, 2016:  45mo ROV732moois is improved- no new complaints or concerns, she has maintained on Pred 10-5 Qod for her sarcoidosis;  She did not tolerate MEDROL saying that "it messed up my tummy" w/ cramps and diarrhea which resolved after switch to Pred...she denies cough, sput, SOB, CP, etc- no f/c/s... She is due for f/u CXR/ Labs today...    EXAM shows Afeb, VSS, O2sat=97% on RA;  HEENT- neg, Mallampati1;  Chest- clear w/o w/r/r;  Heart- RR w/o m/r/g;  Abd- soft, nontender, neg;  Ext- neg w/o c/c/e;  Neuro- intact w/o focal abn...  CXR 08/28/16 (independently reviewed by me in the PACS system) showed norm heart size, aortic atherosclerosis, similar BHA & incr markings unchanged from prev (by my review the markings are better than 09/2015 & similar to 9/17).   LABS 08/28/16>  Chems- ok x K=3.4, BS is wnl at 94, LFTs wnl, A1c=6.4,  ACE level = 24 => ok to cut Pred to 5mg po Q76m. IMP/PLAN>>  We will decr her Pred to 5mg daily42msked to maintain her diet & exercise program, ROV in 32mo & cons69mo CT f/u...   ~  Nov 27, 2016:  32mo ROV & f39moarcoidosis> Susanna reportsEmelynnt her breathing is good & she has no new complaints or concerns, she is exercising in a gym for 1H 3d/wk, doing zoomba etc; current Pred dose is 10mg alt w/ 71mQod and s47mconfirms- no cough, sput, or hemoptysis; her SOB/DOE is stable w/ no difficulty w/ ADLs but notes trouble w/ inclines and stairs; she is also under stress from mult fronts-- Jim's parkinsons & some mental changes, sisters Louise & Ruby Barbaraann Share toBertram Millardolina EstatWalt DisneyreekNortheast UtilitiesfuRamonitahas a support group)... NOTE: she reminds me that her brother had Sarcoid in his 40s, very serv47s, almost died, Rx in [place]...  Hobartd f/u w/ PCP-DrHunter 10/18/16> establish care, hx Sarcoid, HL(on Simva40), DM (A1c=6.4 on diet), GERD (on Nexium Bid), osteoporosis (referred to endocrine)...    She saw DrEllison 11/19/16 for osteoporosis> hx L[facility] compression [facility] & stopped due to arthralgias, BMD 08/2016 showed lowest Tscore= -1.7 in RFN w/ incr risk;  REC to prevent falls, Ca 1200mg/d, VitD 465md, & start Prolia 2x/yr (SPE- neg,  TSH-0.79, VitD-69, PTH=wnl at 25)...    EXAM shows Afeb, VSS, O2sat=100% on RA;  HEENT- neg, Mallampati1;  Chest- clear w/o w/r/r;  Heart- RR w/o m/r/g;  Abd- soft, nontender, neg;  Ext- neg w/o c/c/e;  Neuro- intact w/o focal abn... IMP/PLAN>>  We discussed decreasing her Pred from 10-5 Qod to 40m/d w/ routine rov in 382mo        Problem List:  Hx of UVEITIS (ICD-364.3) [facility] given [facility] in [facility]... s/p right cataract surg 2/09 and no active uveitis seen... ~  4/11:  she tells me she was seen 10/10 & doing well, may need left cataract surg soon. ~  [facility]  DrDickinson noted hx uveitis- steroid responder, s/p bilat cat surg w/ lens replacement, epiretinal membrane & post vitreous detachments...   R/O SARCOIDOSIS (ICD-135) - [facility] CX[facility] w/ CTChest showing bilat hilar adenopathy, and w/u showing neg PPD, ACE=53, sed=13... prev PFT's 1/08 showed FVC 3.75 (111%), FEV1 2.74 (102%), %1sec=73, mid-flows=70%... being followed w/ serial films and "watchful waiting"... there is a family hx of sarcoidosis in one brother (severe dis w/ neuropathy). ~   we had a f/u CXR in Feb09- streaky opacity right mid lung zone, similar to 2008...  ~  CT Chest 7/08 & 6/09 showed mediastinal > hilar adenopathy some w/ calcif (generally smaller than 1/08), area of atx/ scarring right mid zone (infer RUL) & scat <1cm nodules in lower zones w/o  change... ~  CXR 2/10 showed bilat hilar prominence & scarring appears the same- NAD... labs all WNL w/ ACE=56. ~  CXR 4/11 showed mild bilat hilar prominence, scarring, NAD- osteopenia & DJD in TSpine... ACE= 37. ~  CXR 4/12 showed stable bilat hilar prominence, scarring, NAD...[name]~  CXR 11/13 showed similar changes- irreg nodular changes in right suprahilar region, scarring, stable adenopathy, mild atherosclerotic calcif, osteopenia... ~  CXR 3/14 showed normal heart size, atherosclerotic Ao, chr scarring from underlying sarcoid, NAD...[name]~  CXR 1/15 showed norm heart size, areas of scarring appear unchanged, prom hilae w/o change- stable, NAD ~  PFTs 5/15 showed just some sm airways dis> FVC=2.65 (78%), FEV1=1.86 (72%); %1sec=70; mid-flows=55% predicted... After bronchodil her FEV1 improved 6%... Lung Volumes & DLCO are wnl. ~  LABS 5/15:  ACE level = 55  ~  4/16: she had nodule excised from left elbow 4/16 by Laura [name]=> granulomatous nodule c/w unusual manifewstation of Sarcoid, all spec stains were neg... ~  5/16: CXR 5/16 showed stable BHA & scarring on right, NAD, no change... LABS 5/16:  ACE=56, stable. ~  2017>  +FluA illness 09/2015 w/ ER eval & CXR showing progressive abn;  CT Chest 12/2015 confirmed progression of BHA & LUL/RML parenchymal densities felt to be c/w active Sarcoid, ACE=62;  Pt wanted to avoid Bx if poss & we decided on a course of Pred 40-30-20 Q2wk taper w/ ROV in 6wks for f/u=> ACE improved to 23 and we weaned Pred slowly... ~  12/17: Pt perceives several issues w2/ the Pred (now down to 1076m) so we will switch to Medrol8mg75mbs and continue slow weaning..  PMarland KitchenLPITATIONS >>  Hx MITRAL VALVE PROLAPSE >> on ASA 81mg59m. clinical dx w/ 2DEcho 1999 showing flat closure but no frank prolapse... ~  2/16: she had a cardiac eval by Laura [name] for dyspnea> 2DEcho was neg, wnl; stress testing was neg- w/o ischemia; noted to have poor functional  capacity & is way too sedentary; she had  some palpit & monitor revealed PVCs & PACs- didn't want meds therefore asked to avoid caffeine 7 stress...  HYPERCHOLESTEROLEMIA (ICD-272.0) - on SIMVASTATIN 59m/d now... ~  [place] on Lip10 showed TChol 183, TG 122, HDL 45, LDL 114...  ~  [place] Lip10 showed TChol 183, TG 189, HDL 39, LDL 107... continue diet & change to Simva40.  ~  FLP 5/09 on Simva40 showed TChol 185, TG 163, HDL 36, LDL 117... she wants to stay on Simva40. ~  FLP 2/10 showed TChol 176, Tg 160, HDL 39, LDL 105... rec> continue same. ~  FLP 4/11 showed TChol 160, TG 131, HDL 41, LDL 93 ~  FLP 4/12 showed TChol 174, TG 148, HDL 43, LDL 102 ~  FLP 5/13 showed TChol 159, TG 151, HDL 47, LDL 82 ~  FLP 5/14 on Simva40 showed TChol 165, TG 157, HDL 41, LDL 93 ~  FLP 11/15 on Simva40 showed TChol 169, TG 135, HDL 41, LDL 101 ~  FLP 11/16 on Simva40 showed TChol 161, TG 146, HDL 44, LDL 87  GERD (ICD-530.81) - on NEXIUM 446md & ZANTAC 30022ms... ~  EGD 6/07 w/ 3cmHH, stricture, gastitis (HPylori neg)... I offered to change her Nexium to a generic but she declined- "DrPatterson told me never to change this med"... she notes occas nocturnal reflux symptoms. ~  5/12: She had GI eval by DrPatterson 5/12> chr GERD, hx 3cmHH, prev stricture dilated, hx colon polyps, etc> c/o incr reflux symptoms, bloating, pressure despite her Nexium40AM & Zantac300PM;  Nexium was increased to Bid, Carafate added Qid & she had EGD 5/12 showing mod gastritis w/ bx= chr inflamm, neg HPylori... ~  CTAbd 5/12 showed mult parenchymal nodules at lung bases (some fractionally larger), calcif hilar nodes bilat, no renal lesions (?left upper pole mass seen on sonar?). ~  EGD 3/13 by DrPatterson showed 3cmHH, esophagitis, prob "pill espohagitis", dilated... Symptoms resolved w/ Carafate, Nexium, Zantac, Xylocaine... ~  5/14: on Nexium40Bid, Zantac300HS, CarafateQid prn, & Zofran prn; she remains asymptomatic w/o abd pain, n/v, c/d, blood seen... ~  11/15: on  Nexium40, Carafate1GmQhs, & Zofran prn; she has seen Laura [name] & remains asymptomatic on these meds- w/o abd pain, n/v, c/d, blood seen...  IRRITABLE BOWEL SYNDROME (ICD-564.1) COLONIC POLYPS, HX OF (ICD-V12.72)  ~  Colonoscopy 6/07 by DrPatterson showing divertics & polyps (adenomatous)... f/u planned 55yr18yr ~  She had f/u colonoscopy 5/12 showing mod divertics, sessile polyp in cecum= serrated adenoma w/ f/u planned 3-510yrs18yr UTI'S, CHRONIC (ICD-599.0) - eval by DrOttelin for urology... now off the Macrodantin and using cranberry juice without recurrent UTI, no problems...  DEGENERATIVE JOINT DISEASE (ICD-715.90) - eval by DrDalldorf w/ mod DJD in knees & hx of torn left meniscus... 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 overall improved now. ~  11/13:  TRAMADOL 50mg 65mrefilled for prn use... ~  2015-16: she[facility]> right & left knee DJD, right ankle tendonitis, right plantar fasciitis...  ~  4/16: she had right elbow surg by Laura [name]> mult granulomas on path c/w sarcoid nodule...  LOW BACK PAIN, CHRONIC (ICD-724.2) ~  2/14: she fell while getting up at night & hit her back w/ severe pain & L1 compression fx- [facility]/ [facility] & good pain relief...  OSTEOPENIA (ICD-733.90) - on Calcium,  MVI, Vit D... ~  BMD in 2005 showed TScore -1.2 in Spine, & -0.7 in [facility]. ~  BMD 5/09 showed TScores -1.5 in Spine & -1.1 in FemNecks. ~  BMD here 5/11 showed TScores -1.2 in Spine, and -1.2 in [facility]. ~  5/13 & 5/14:  She is due for f/u BMD but wants to wait for now... ~  Labs 11/15 showed VitD= 52... ~  BMD 2/16 revealed Tscore -1.8 in Lspine and Rt FemNeck; we rec FOSAMAX70/wk in light of her prev compression fx=> pt stopped the Bisphos after several months due to reflux symptms and refuses alternative med... ~  She is rec to take Calcium, MVI,  VitD supplement, & wt bearing exercise...  ANXIETY (ICD-300.00) >> given ALPRAZOLAM 0.1m tabs w/ directions to try 1/2 to 1 tab Tid more regularly...  Hx of BASAL CELL SKIN CANCER - she still sees her Derm in [place]  Health Maintenance: ~  GI:  followed by DrPatterson/Pyrtle w/ colon as above... ~  GYN:  followed by DrRichardson & doing well by pt's report... [facility].. ~  Immunizations:  she gets yearly flu shots;  has PNEUMOVAX in 1998 & repeated 10/11 (age 76;  TDAP given 5/13;  we discussed Shingles vaccine (she had bout of shingles involving her right hip & leg ~age30) & she will decide.    Past Surgical History:  Procedure Laterality Date  . ABDOMINAL HYSTERECTOMY    . BASAL CELL CARCINOMA EXCISION    . CATARACT EXTRACTION Bilateral   . COLONOSCOPY  2012  . MASS EXCISION Left 11/02/2014   Procedure: EXCISION MASS LEFT ELBOW;  Surgeon: GDaryll Brod MD;  [place] [facility]  Service: Orthopedics;  Laterality: Left;  . POLYPECTOMY    . UPPER GASTROINTESTINAL ENDOSCOPY    . VESICOVAGINAL FISTULA CLOSURE W/ TAH     "sling" after hystrectomy per patient    Outpatient Encounter Prescriptions as of 11/27/2016  Medication Sig  . ALPRAZolam (XANAX) 0.25 MG tablet Take 1/2 to 1 tablet by mouth three times daily  . aspirin 81 MG tablet Take 81 mg by mouth daily.    . B Complex Vitamins (VITAMIN-B COMPLEX PO) Take 1 capsule by mouth daily.   . Calcium Carbonate (CALTRATE 600 PO) Take 1 tablet by mouth 2 (two) times daily.    . cholecalciferol (VITAMIN D) 1000 units tablet Take 1,000 Units by mouth daily.  . Esomeprazole Magnesium (NEXIUM 24HR) 20 MG TBEC Take 1 tablet by mouth 2 (two) times daily.  . Glucosamine HCl (GLUCOSAMINE PO) Liquid form - Take by mouth as directed once daily  . meclizine (ANTIVERT) 25 MG tablet Take 1/2-1 tablet by mouth every 4 hours as needed for dizziness  . Multiple Vitamins-Minerals (MULTIVITAL) tablet Take  1 tablet by mouth daily.    . Omega-3 Fatty Acids (FISH OIL) 1000 MG CAPS Take 1 capsule by mouth daily.    . ondansetron (ZOFRAN) 8 MG tablet 1 tablet by mouth every 6 hours as needed for nausea  . simvastatin (ZOCOR) 40 MG tablet Take 1 tablet (40 mg total) by mouth at bedtime.  . sucralfate (CARAFATE) 1 g tablet Take 1 tablet (1 g total) by mouth 2 (two) times daily.  . [DISCONTINUED] predniSONE (DELTASONE) 5 MG tablet Take as directed  . predniSONE (DELTASONE) 5 MG tablet Take 1 tablet (5 mg total) by mouth daily with breakfast.   No facility-administered encounter medications on file as of 11/27/2016.  Allergies  Allergen Reactions  . 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 Amoxicillin-Pot Clavulanate     REACTION: causes diarrhea  . Codeine     REACTION: nausea  . Levaquin [Levofloxacin In D5w]     Had itching w/ intravenous, not sure if reaction was from abx or the need to change the IV.  Does well when taken with Benadryl. Pt states can take orally with no issues   . Tdap [Diphth-Acell Pertussis-Tetanus]     Area raised, warm to touch, tender, swollen, pt felt jittery, nausea and some SOB  . Tape Rash    Blisters skin and removes skin --NEEDS PAPER TAPE    Immunization History  Administered Date(s) Administered  . Influenza Split 05/23/2011, 05/23/2012, 05/04/2013, 05/31/2014  . Influenza Whole 09/11/2009, 05/08/2010  . Influenza, High Dose Seasonal PF 05/02/2016  . Influenza-Unspecified 05/25/2015  . Pneumococcal Polysaccharide-23 05/17/2010  . Tdap 11/21/2011   Current Medications, Allergies, Past Medical History, Past Surgical History, Family History, and Social History were reviewed in [employer] record.   Review of Systems         See HPI - all other systems neg except as noted... The patient denies anorexia, fever, weight loss, weight gain, vision  loss, decreased hearing, hoarseness, chest pain, syncope, dyspnea on exertion, peripheral edema, prolonged cough, headaches, hemoptysis, abdominal pain, melena, hematochezia, severe indigestion/heartburn, hematuria, incontinence, muscle weakness, suspicious skin lesions, transient blindness, difficulty walking, depression, unusual weight change, abnormal bleeding, enlarged lymph nodes, and angioedema.     Objective:   Physical Exam    WD, WN, 76 y/o WF in NAD... GENERAL:  Alert & oriented; pleasant & cooperative... HEENT:  [place]/AT, EOM-wnl, PERRLA, EACs-clear, TMs-wnl, NOSE-clear, THROAT-clear & wnl. NECK:  Supple w/ fairROM; no JVD; normal carotid impulses w/o bruits; no thyromegaly or nodules palpated; no lymphadenopathy. CHEST:  Clear to P & A; without wheezes/ rales/ or rhonchi. HEART:  Regular Rhythm; without murmurs/ rubs/ or gallops. ABDOMEN:  Soft & nontender; normal bowel sounds; no organomegaly or masses detected. EXT: without deformities, mild arthritic changes; no varicose veins/ +venous insuffic/ no edema. NEURO:  CN's intact;  no focal neuro deficits... DERM:  No lesions noted; no rash etc...  RADIOLOGY DATA:  Reviewed in the EPIC EMR & discussed w/ the patient...  LABORATORY DATA:  Reviewed in the EPIC EMR & discussed w/ the patient...   Assessment & Plan:    DYSPNEA w/ thorough PULM & CARDIAC eval>> underlying sarcoidosis w/ mild pulm fibrosis & prev no signs of dis activity- on watchful waiting protocol; Cards eval by Laura [name] was neg as well x PVCs & PACs; she has improved on a gradual exercise program, avoids caffeine & stress...  SARCOID>  Previous CXR/ PFT/ ACE stable & she remains asymptomatic, nodule left elbow excised & path revealed nonnecrotizing granulomata... ~  12/2015>  FluA illness 09/2015 w/ CXR via ER showed some progression of the BHA & parenchymal abnormalities; CT Chest 12/2015 confirmed & ACE=62; We decided to start Rx w/ PRED 40-30-20 Q2wk taper w/ ROV/ in  6wks. ~  02/2016>  Clinically ?sl better w/ a few Pred side effects, ACE improved to 23 & we decided to cut the Pred to 20-10 Qod til ret 6wks... ~  03/2016>  CXR shows sl improvement on the Pred 20-10Qod;  We decided to continue this dose thru Oct &  decr to 69m/d starting in Nov w/ ROV in 351mo~  06/2016>  LoAdrianaerceives some difficulty w/ the Pred but she is improved overall & ACE down to 28; we decided to change to MEDROL 47m70mabs taking one Qam for Dec & cut to 47mg647mternating w/ 4mg 37m starting Jan2018=> we will recheck her in Feb w/ f/u CTChest due and also due for 67yr r70yrck BMD ~  11/27/16>   We discussed decreasing her Pred from 10-5 Qod to 5mg/d 45mroutine rov in 32mo   C15mo  Stable on the Simva40 + diet...  GERD>  On Prilosec & Zantac, she is currently improved from her prev symptoms...  DJD/ LBP/ Osteopenia>  As noted she remains stable on current meds & exercise program; she will see DrDaldorf about her ankle pain; BMD 2/16 w/ Tscore -1.8 & rec to start [id number] fx L1 after fall> she is s/p L1 kyphoplasty...  Other medical issues as noted...   Patient's Medications  New Prescriptions   PREDNISONE (DELTASONE) 5 MG TABLET    Take 1 tablet (5 mg total) by mouth daily with breakfast.  Previous Medications   ALPRAZOLAM (XANAX) 0.25 MG TABLET    Take 1/2 to 1 tablet by mouth three times daily   ASPIRIN 81 MG TABLET    Take 81 mg by mouth daily.     B COMPLEX VITAMINS (VITAMIN-B COMPLEX PO)    Take 1 capsule by mouth daily.    CALCIUM CARBONATE (CALTRATE 600 PO)    Take 1 tablet by mouth 2 (two) times daily.     CHOLECALCIFEROL (VITAMIN D) 1000 UNITS TABLET    Take 1,000 Units by mouth daily.   ESOMEPRAZOLE MAGNESIUM (NEXIUM 24HR) 20 MG TBEC    Take 1 tablet by mouth 2 (two) times daily.   GLUCOSAMINE HCL (GLUCOSAMINE PO)    Liquid form - Take by mouth as directed once daily   MECLIZINE (ANTIVERT) 25 MG TABLET    Take 1/2-1 tablet by mouth every 4 hours as needed  for dizziness   MULTIPLE VITAMINS-MINERALS (MULTIVITAL) TABLET    Take 1 tablet by mouth daily.     OMEGA-3 FATTY ACIDS (FISH OIL) 1000 MG CAPS    Take 1 capsule by mouth daily.     ONDANSETRON (ZOFRAN) 8 MG TABLET    1 tablet by mouth every 6 hours as needed for nausea   SIMVASTATIN (ZOCOR) 40 MG TABLET    Take 1 tablet (40 mg total) by mouth at bedtime.   SUCRALFATE (CARAFATE) 1 G TABLET    Take 1 tablet (1 g total) by mouth 2 (two) times daily.  Modified Medications   No medications on file  Discontinued Medications   PREDNISONE (DELTASONE) 5 MG TABLET    Take as directed

## 2016-12-03 ENCOUNTER — Telehealth: Payer: Self-pay | Admitting: Family Medicine

## 2016-12-03 NOTE — Telephone Encounter (Signed)
Pt and her husband needs follow up appts (cpe) in October. Pt very confused why she cannot schedule. who does the pt need to call to get their appts scheduled for October?

## 2016-12-04 ENCOUNTER — Ambulatory Visit: Payer: Medicare Other | Admitting: Family Medicine

## 2016-12-04 NOTE — Telephone Encounter (Signed)
Juliann Pulse- [facility] schedule should be open now and you can schedule it for them

## 2016-12-30 ENCOUNTER — Emergency Department (HOSPITAL_BASED_OUTPATIENT_CLINIC_OR_DEPARTMENT_OTHER): Payer: Medicare Other

## 2016-12-30 ENCOUNTER — Emergency Department (HOSPITAL_BASED_OUTPATIENT_CLINIC_OR_DEPARTMENT_OTHER)
Admission: EM | Admit: 2016-12-30 | Discharge: 2016-12-30 | Disposition: A | Discharge status: Home / Self Care | Payer: Medicare Other | Attending: Emergency Medicine | Admitting: Emergency Medicine

## 2016-12-30 ENCOUNTER — Encounter (HOSPITAL_BASED_OUTPATIENT_CLINIC_OR_DEPARTMENT_OTHER): Payer: Self-pay | Admitting: Emergency Medicine

## 2016-12-30 DIAGNOSIS — W2209XA Striking against other stationary object, initial encounter: Secondary | ICD-10-CM | POA: Diagnosis not present

## 2016-12-30 DIAGNOSIS — S62617A Displaced fracture of proximal phalanx of left little finger, initial encounter for closed fracture: Secondary | ICD-10-CM | POA: Insufficient documentation

## 2016-12-30 DIAGNOSIS — S59912A Unspecified injury of left forearm, initial encounter: Secondary | ICD-10-CM | POA: Diagnosis present

## 2016-12-30 DIAGNOSIS — S62647A Nondisplaced fracture of proximal phalanx of left little finger, initial encounter for closed fracture: Secondary | ICD-10-CM

## 2016-12-30 DIAGNOSIS — E119 Type 2 diabetes mellitus without complications: Secondary | ICD-10-CM | POA: Insufficient documentation

## 2016-12-30 DIAGNOSIS — S51012A Laceration without foreign body of left elbow, initial encounter: Secondary | ICD-10-CM | POA: Insufficient documentation

## 2016-12-30 DIAGNOSIS — Z7982 Long term (current) use of aspirin: Secondary | ICD-10-CM | POA: Diagnosis not present

## 2016-12-30 DIAGNOSIS — I341 Nonrheumatic mitral (valve) prolapse: Secondary | ICD-10-CM | POA: Insufficient documentation

## 2016-12-30 DIAGNOSIS — Y92012 Bathroom of single-family (private) house as the place of occurrence of the external cause: Secondary | ICD-10-CM | POA: Diagnosis not present

## 2016-12-30 DIAGNOSIS — Y9389 Activity, other specified: Secondary | ICD-10-CM | POA: Insufficient documentation

## 2016-12-30 DIAGNOSIS — Y998 Other external cause status: Secondary | ICD-10-CM | POA: Diagnosis not present

## 2016-12-30 DIAGNOSIS — S51002A Unspecified open wound of left elbow, initial encounter: Secondary | ICD-10-CM | POA: Diagnosis not present

## 2016-12-30 DIAGNOSIS — Z85828 Personal history of other malignant neoplasm of skin: Secondary | ICD-10-CM | POA: Diagnosis not present

## 2016-12-30 DIAGNOSIS — Z79899 Other long term (current) drug therapy: Secondary | ICD-10-CM | POA: Diagnosis not present

## 2016-12-30 MED ORDER — ACETAMINOPHEN 500 MG PO TABS
1000.0000 mg | ORAL_TABLET | Freq: Once | ORAL | Status: AC
  Administered 2016-12-30: 1000 mg via ORAL
  Filled 2016-12-30: qty 2

## 2016-12-30 NOTE — ED Provider Notes (Signed)
[facility] DEPT [facility] Provider Note   CSN: [id number] Arrival date & time: 12/30/16  0731     History   Chief Complaint Chief Complaint  Patient presents with  . Fall    HPI Laura [name] is a 76 y.o. female.  76 yo F with a chief complaint of left hand pain. The patient was trying to hit a bug behind her toilet and bumped her arm against the side of the cabinet. She had a skin tear to the left forearm. Will give this morning with significant bruising to her left hand. Complaining of pain mostly to the left pinky finger. Denies other injury.   The history is provided by the patient.  Fall  This is a new problem. The current episode started yesterday. The problem occurs rarely. The problem has been resolved. Pertinent negatives include no chest pain, no headaches and no shortness of breath. The symptoms are aggravated by bending and twisting. Nothing relieves the symptoms. She has tried nothing for the symptoms. The treatment provided no relief.    Past Medical History:  Diagnosis Date  . Adenomatous colon polyp    sessile  . Allergy    SEASONAL  . Anxiety   . Cataract    bilateral  . Colon polyps   . Diverticulosis of colon (without mention of hemorrhage)   . DJD (degenerative joint disease)   . Dysrhythmia    pac  . Esophagitis   . GERD (gastroesophageal reflux disease)   . Hiatal hernia   . Hypercholesterolemia   . IBS (irritable bowel syndrome)   . MVP (mitral valve prolapse)   . Osteopenia   . Personal history of colonic polyps    SESSILE SERRATED ADENOMAS (X2)  . PONV (postoperative nausea and vomiting)   . Sarcoidosis   . Shortness of breath dyspnea   . Stricture and stenosis of esophagus   . Unspecified gastritis and gastroduodenitis without mention of hemorrhage   . Urinary tract infection, site not specified   . Uveitis     Patient Active Problem List   Diagnosis Date Noted  . Hyperlipidemia 10/18/2016  . History of basal cell cancer  10/18/2016  . Controlled type 2 diabetes mellitus without complication, without long-term current use of insulin ([place]) 10/18/2016  . Osteoporosis 12/06/2014  . Heart palpitations 07/26/2014  . Dyspnea 08/20/2013  . Compression fx, lumbar spine ([place]) 09/24/2012  . Pneumonia 09/24/2012  . Sarcoidosis 11/21/2011  . GERD with stricture 10/12/2011  . Uveitis 09/21/2007  . Osteoarthritis 09/21/2007  . Recurrent UTI 09/09/2007  . Anxiety state 08/06/2007  . IBS (irritable bowel syndrome) 08/06/2007  . LOW BACK PAIN, CHRONIC 08/06/2007  . History of colonic polyps 08/06/2007    Past Surgical History:  Procedure Laterality Date  . ABDOMINAL HYSTERECTOMY    . BASAL CELL CARCINOMA EXCISION    . CATARACT EXTRACTION Bilateral   . COLONOSCOPY  2012  . MASS EXCISION Left 11/02/2014   Procedure: EXCISION MASS LEFT ELBOW;  Surgeon: Daryll Brod, MD;  Location: [facility];  Service: Orthopedics;  Laterality: Left;  . POLYPECTOMY    . UPPER GASTROINTESTINAL ENDOSCOPY    . VESICOVAGINAL FISTULA CLOSURE W/ TAH     "sling" after hystrectomy per patient    OB History    No data available       Home Medications    Prior to Admission medications   Medication Sig Start Date End Date Taking? Authorizing Provider  ALPRAZolam Duanne Moron) 0.25 MG tablet Take  1/2 to 1 tablet by mouth three times daily 06/26/16  Yes Noralee Space, MD  aspirin 81 MG tablet Take 81 mg by mouth daily.     Yes [provider]  B Complex Vitamins (VITAMIN-B COMPLEX PO) Take 1 capsule by mouth daily.    Yes [provider]  Calcium Carbonate (CALTRATE 600 PO) Take 1 tablet by mouth 2 (two) times daily.     Yes [provider]  cholecalciferol (VITAMIN D) 1000 units tablet Take 1,000 Units by mouth daily.   Yes [provider]  Esomeprazole Magnesium (NEXIUM 24HR) 20 MG TBEC Take 1 tablet by mouth 2 (two) times daily.   Yes [provider]  Glucosamine HCl (GLUCOSAMINE  PO) Liquid form - Take by mouth as directed once daily   Yes [provider]  meclizine (ANTIVERT) 25 MG tablet Take 1/2-1 tablet by mouth every 4 hours as needed for dizziness 06/03/13  Yes Noralee Space, MD  Multiple Vitamins-Minerals (MULTIVITAL) tablet Take 1 tablet by mouth daily.     Yes [provider]  Omega-3 Fatty Acids (FISH OIL) 1000 MG CAPS Take 1 capsule by mouth daily.     Yes [provider]  ondansetron (ZOFRAN) 8 MG tablet 1 tablet by mouth every 6 hours as needed for nausea 06/03/13  Yes Noralee Space, MD  predniSONE (DELTASONE) 5 MG tablet Take 1 tablet (5 mg total) by mouth daily with breakfast. 11/27/16  Yes Noralee Space, MD  simvastatin (ZOCOR) 40 MG tablet Take 1 tablet (40 mg total) by mouth at bedtime. 08/28/16  Yes Noralee Space, MD  sucralfate (CARAFATE) 1 g tablet Take 1 tablet (1 g total) by mouth 2 (two) times daily. 08/28/16  Yes Noralee Space, MD    Family History Family History  Problem Relation Age of Onset  . Melanoma Mother        per pt melanoma   . Colon cancer Sister        sees Dr Henrene Pastor   . Colon polyps Brother   . Sarcoidosis Brother   . Colon polyps Sister        Dr stark   . Colon polyps Sister   . Endometrial cancer Sister   . Lung cancer Brother   . Other Unknown        nephew with Wegener's   . Stroke Sister   . Osteoporosis Neg Hx     Social History Social History  Substance Use Topics  . Smoking status: Never Smoker  . Smokeless tobacco: Never Used  . Alcohol use 0.6 oz/week    1 Glasses of wine per week     Comment: occ wine      Allergies   Prednisone; Amoxicillin-pot clavulanate; Codeine; Levaquin [levofloxacin in d5w]; Tdap [diphth-acell pertussis-tetanus]; and Tape   Review of Systems Review of Systems  Constitutional: Negative for chills and fever.  HENT: Negative for congestion and rhinorrhea.   Eyes: Negative for redness and visual disturbance.  Respiratory: Negative for shortness of  breath and wheezing.   Cardiovascular: Negative for chest pain and palpitations.  Gastrointestinal: Negative for nausea and vomiting.  Genitourinary: Negative for dysuria and urgency.  Musculoskeletal: Positive for arthralgias and myalgias.  Skin: Positive for wound. Negative for pallor.  Neurological: Negative for dizziness and headaches.     Physical Exam Updated Vital Signs BP (!) 193/85 (BP Location: Right Arm)   Pulse 77   Temp 98.6 F (37 C) (Oral)  Resp 18   Ht 5\' 8"  (1.727 m)   Wt 78.9 kg (174 lb)   SpO2 99%   BMI 26.46 kg/m   Physical Exam  Constitutional: She is oriented to person, place, and time. She appears well-developed and well-nourished. No distress.  HENT:  Head: Normocephalic and atraumatic.  Eyes: EOM are normal. Pupils are equal, round, and reactive to light.  Neck: Normal range of motion. Neck supple.  Cardiovascular: Normal rate and regular rhythm.  Exam reveals no gallop and no friction rub.   No murmur heard. Pulmonary/Chest: Effort normal. She has no wheezes. She has no rales.  Abdominal: Soft. She exhibits no distension. There is no tenderness.  Musculoskeletal: She exhibits no edema or tenderness.       Arms: Neurological: She is alert and oriented to person, place, and time.  Skin: Skin is warm and dry. She is not diaphoretic.  Psychiatric: She has a normal mood and affect. Her behavior is normal.  Nursing note and vitals reviewed.    ED Treatments / Results  Labs (all labs ordered are listed, but only abnormal results are displayed) Labs Reviewed - No data to display  EKG  EKG Interpretation None       Radiology Dg Hand Complete Left  Result Date: 12/30/2016 CLINICAL DATA:  Pain following fall EXAM: LEFT HAND - COMPLETE 3+ VIEW COMPARISON:  None. FINDINGS: Frontal, oblique, and lateral views were obtained. There is a comminuted fracture of the proximal aspect of the fifth proximal phalanx with alignment overall near anatomic. No  other fractures are evident. No dislocation. There is marked osteoarthritic change in the first carpal-metacarpal joint. There is milder osteoarthritic change in all PIP and DIP joints. There is a small subchondral type cyst in the lunate. IMPRESSION: Comminuted fracture proximal aspect fifth proximal phalanx with fracture fragments in near anatomic alignment. No other fractures. No dislocation. Multifocal osteoarthritic change, most marked in the first carpal -metacarpal joint. Electronically Signed   By: Lowella Grip III M.D.   On: 12/30/2016 08:21    Procedures Procedures (including critical care time)  Medications Ordered in ED Medications  acetaminophen (TYLENOL) tablet 1,000 mg (1,000 mg Oral Given 12/30/16 0749)     Initial Impression / Assessment and Plan / ED Course  I have reviewed the triage vital signs and the nursing notes.  Pertinent labs & imaging results that were available during my care of the patient were reviewed by me and considered in my medical decision making (see chart for details).     76 yo F With a chief complaint of a skin tear and a blunt hand injury. Will obtain a plain film of the left hand. Tdap up-to-date.  Plain film with the proximal phalanx fracture extending into the MCP joint on plain film. We'll place in an ulnar gutter. Have her follow-up with orthopedics.  8:40 AM:  I have discussed the diagnosis/risks/treatment options with the patient and believe the pt to be eligible for discharge home to follow-up with Ortho, PCP. We also discussed returning to the ED immediately if new or worsening sx occur. We discussed the sx which are most concerning (e.g., redness, drainage, swelling, fever) that necessitate immediate return. Medications administered to the patient during their visit and any new prescriptions provided to the patient are listed below.  Medications given during this visit Medications  acetaminophen (TYLENOL) tablet 1,000 mg (1,000 mg  Oral Given 12/30/16 0749)     The patient appears reasonably screen and/or stabilized for discharge  and I doubt any other medical condition or other [facility] requiring further screening, evaluation, or treatment in the ED at this time prior to discharge.    Final Clinical Impressions(s) / ED Diagnoses   Final diagnoses:  Skin tear of left elbow without complication, initial encounter  Closed nondisplaced fracture of proximal phalanx of left little finger, initial encounter    New Prescriptions New Prescriptions   No medications on file     Deno Etienne, DO 12/30/16 0938

## 2016-12-30 NOTE — ED Notes (Signed)
Patient transported to X-ray 

## 2016-12-30 NOTE — ED Notes (Signed)
ED Provider at bedside. 

## 2016-12-30 NOTE — ED Triage Notes (Signed)
Pt tripped and fell last night while trying to get a bug. Pt c/o L hand pain and has a large skin tear to L arm.

## 2016-12-30 NOTE — Discharge Instructions (Signed)
Follow up with your ortho for a visit in 1 week for your finger fracture.    Tylenol for pain.

## 2017-01-03 DIAGNOSIS — S62647A Nondisplaced fracture of proximal phalanx of left little finger, initial encounter for closed fracture: Secondary | ICD-10-CM | POA: Diagnosis not present

## 2017-02-12 DIAGNOSIS — S62647D Nondisplaced fracture of proximal phalanx of left little finger, subsequent encounter for fracture with routine healing: Secondary | ICD-10-CM | POA: Diagnosis not present

## 2017-02-17 IMAGING — CT CT CHEST W/ CM
2 of 3 series · 15 of 36 positions shown, 18 images · IV contrast (iopamidol)
Comparison: 12/30/07

CLINICAL DATA: Followup sarcoid.

EXAM:
CT CHEST WITH CONTRAST
TECHNIQUE: Multidetector CT imaging of the chest was performed during
intravenous contrast administration.
CONTRAST:  80mL TOTAQ8-ZMM IOPAMIDOL (TOTAQ8-ZMM) INJECTION 61%

[Series 2: thorax · axial · 0.70mm/px · z∈[-305,-45]mm · 12 of 154 slices shown, 15 images]
[im 12/154  mediastinal]
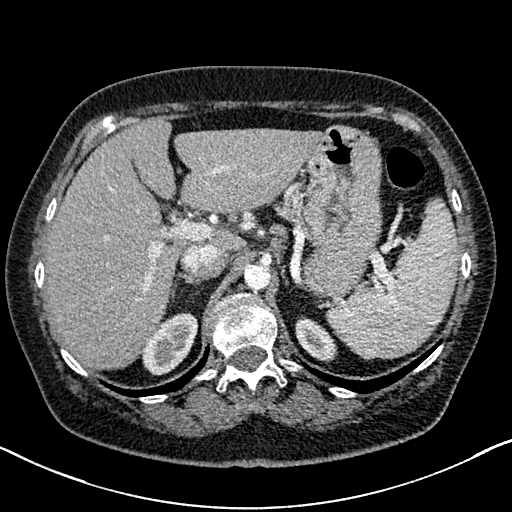
[im 12/154  lung]
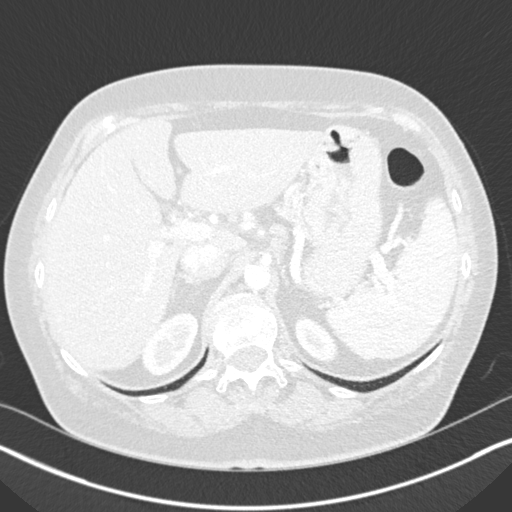
[im 23/154  lung]
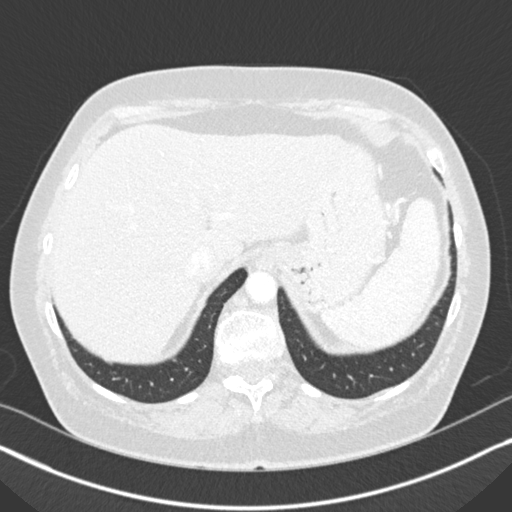
[im 35/154  lung]
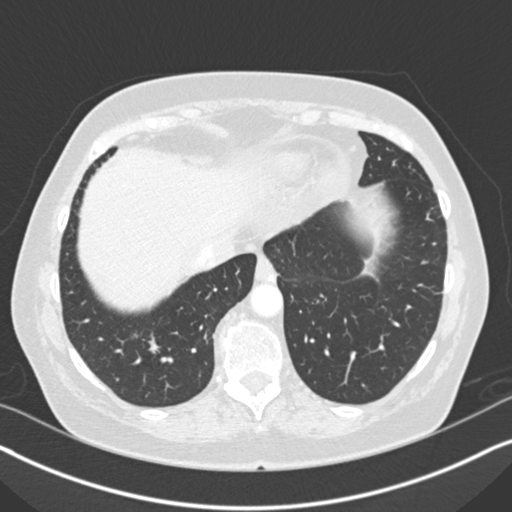
[im 46/154  lung]
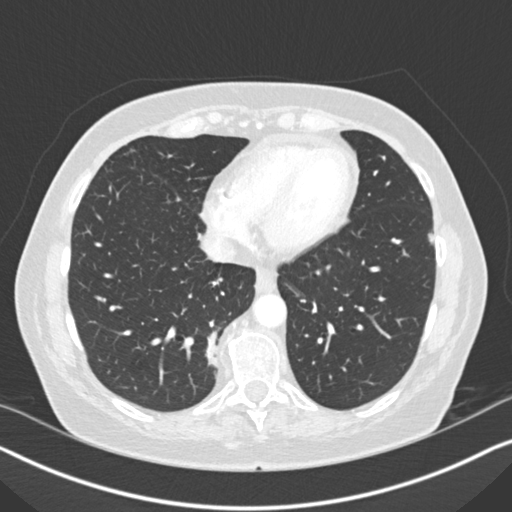
[im 57/154  mediastinal]
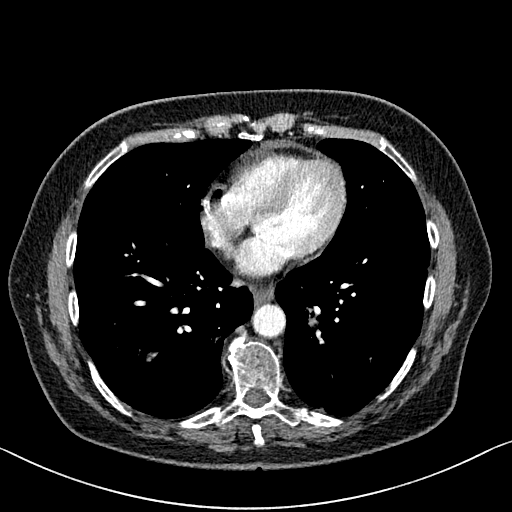
[im 57/154  lung]
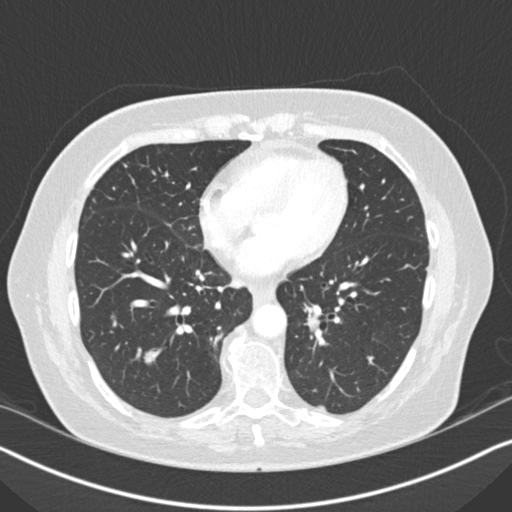
[im 69/154  lung]
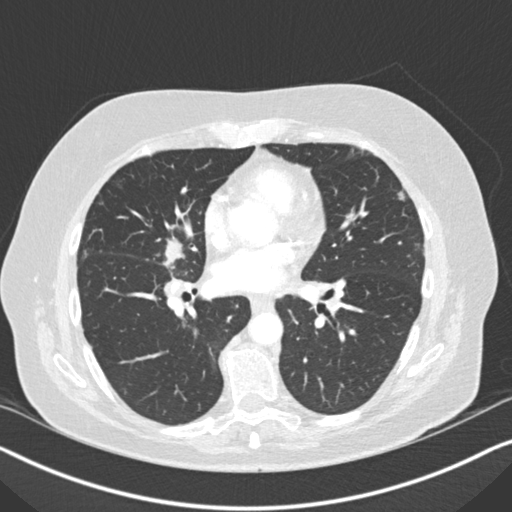
[im 86/154  lung]
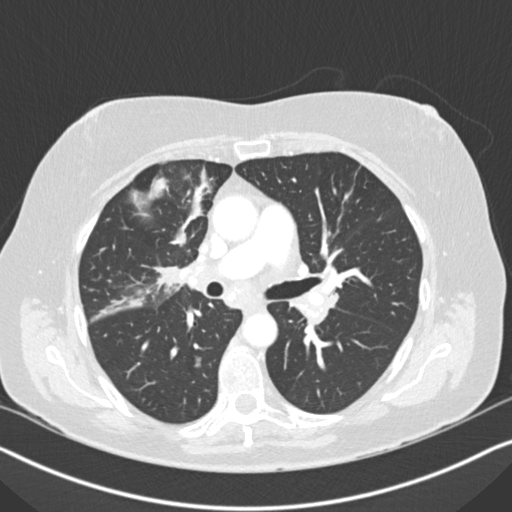
[im 97/154  lung]
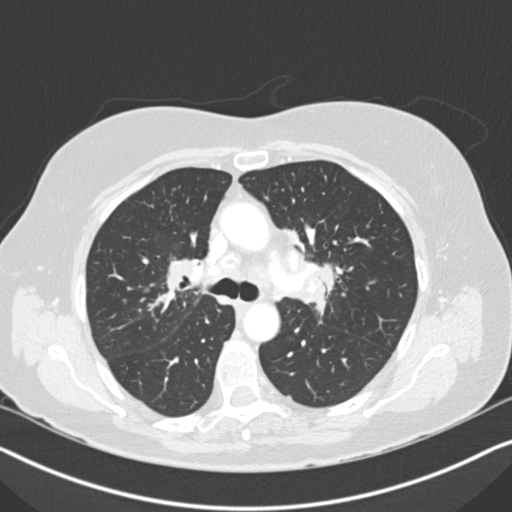
[im 108/154  mediastinal]
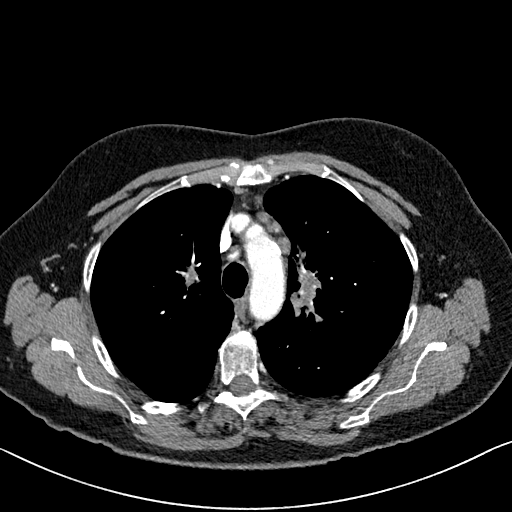
[im 108/154  lung]
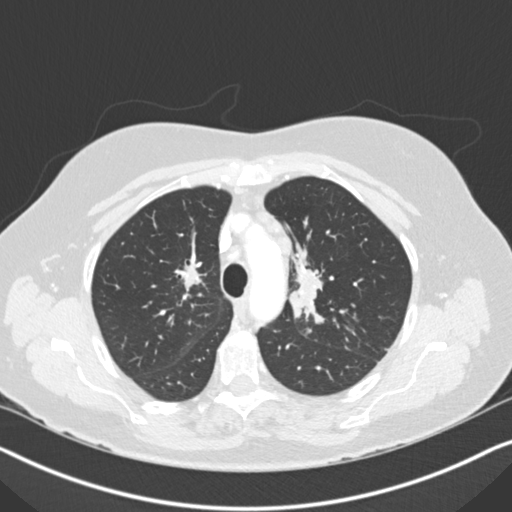
[im 120/154  lung]
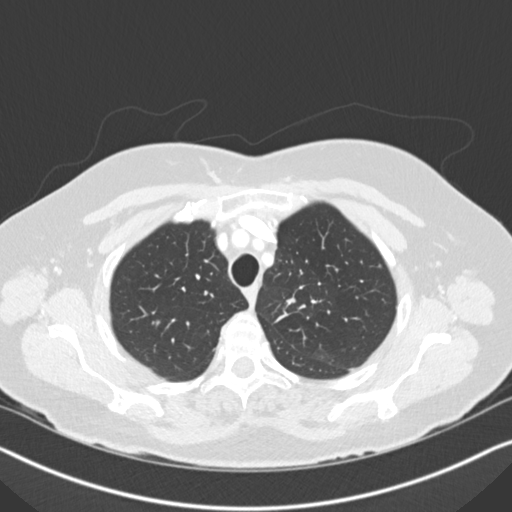
[im 131/154  lung]
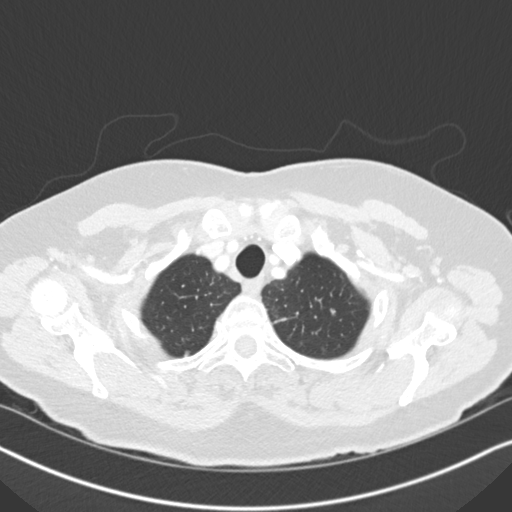
[im 142/154  lung]
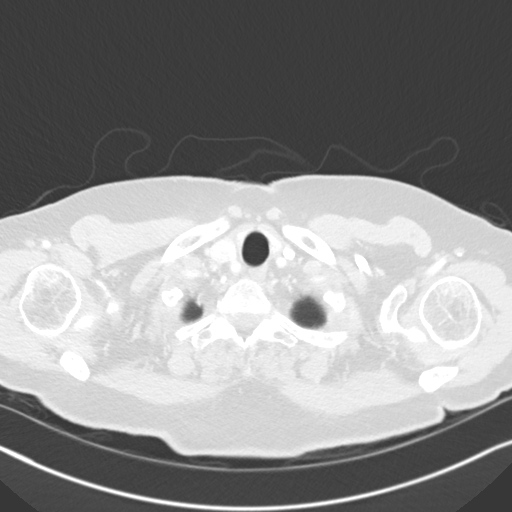

[Series 5: coronal · coronal · 0.60mm/px · 3 of 130 slices shown]
[im 26/130  lung]
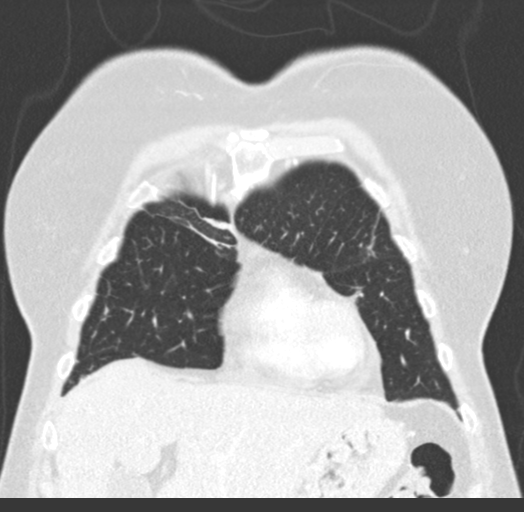
[im 52/130  lung]
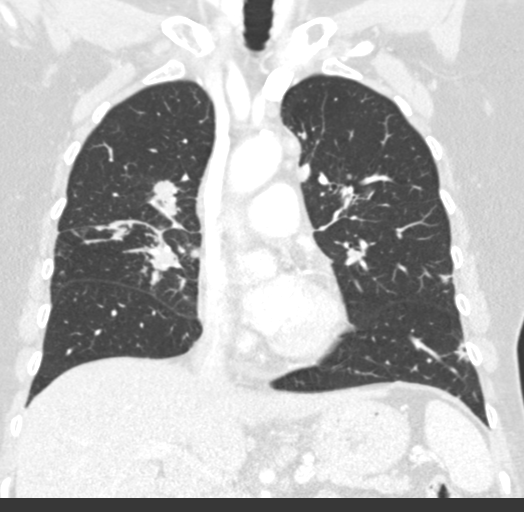
[im 78/130  lung]
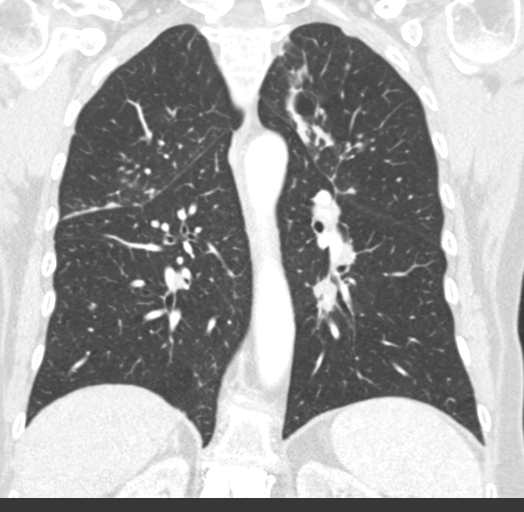

[15 of 36 positions shown; findings below may reference images not displayed]

FINDINGS: Mediastinum/Lymph Nodes: The heart size appears normal. Aortic
atherosclerosis noted. Calcification in the RCA, LAD and left
circumflex coronary arteries noted. Partially calcified mediastinal
adenopathy is again identified. Index right paratracheal lymph node
measures 1.3 cm, image 54 of series 2. Previously 1.4 cm. Sub-
carinal lymph node measures 1.6 cm, image 68 of series 2. Unchanged
from previous exam. Progression of bilateral hilar adenopathy. Index
left hilar node measures 1.8 cm, image 63 of series 2. Previously
1.5 cm. New right hilar lymph node measures 1.1 cm, image 66 of
series 2 peer right hilar node measures 2.1 cm, image 77 of series
2. Previously 1.3 cm.

Lungs/Pleura: There has been interval progression pulmonary
parenchymal sarcoid. low Bilateral upper lobe predominant
parenchymal areas of lymphovascular nodularity and mass-like
consolidation are identified. Masslike density within the right
middle lobe with adjacent pleural thickening and nodularity measures
3.8 x 4.5 cm, image 73 of series 3. Within the anterior medial left
upper lobe there is a masslike area of consolidation measures 3.9 x
2.0 cm, image 53 of series 3. New from previous exam.

Upper abdomen: Wit

1. Aortic atherosclerosis and coronary artery calcification.
Jim the upper abdomen there is a gastrohepatic ligament lymph node
which measures 1.3 cm, image 141 of series 2. New from previous
exam. Portacaval lymph node measures 1.3 cm short axis, image 148 of
series 2. Previously 1.0 cm. No focal liver abnormality identified.
The visualized portions of the spleen, adrenal glands, pancreas are
unremarkable.

Musculoskeletal: The bones appear osteopenic. There is multi level
spondylosis noted within the thoracic spine. Treated compression
fracture is identified involving the L1 vertebra.
IMPRESSION: 1. Interval progression of bilateral hilar adenopathy as well as
lymphovascular nodularity and masslike consolidation compatible with
progression of sarcoid.

## 2017-03-04 ENCOUNTER — Telehealth: Payer: Self-pay

## 2017-03-04 NOTE — Telephone Encounter (Signed)
Spoke with the patient and she agreed to call me back tomorrow to schedule her injection

## 2017-03-05 ENCOUNTER — Ambulatory Visit: Payer: Medicare Other | Admitting: Pulmonary Disease

## 2017-03-07 ENCOUNTER — Ambulatory Visit (INDEPENDENT_AMBULATORY_CARE_PROVIDER_SITE_OTHER): Payer: Medicare Other | Admitting: Pulmonary Disease

## 2017-03-07 ENCOUNTER — Other Ambulatory Visit (INDEPENDENT_AMBULATORY_CARE_PROVIDER_SITE_OTHER): Payer: Medicare Other

## 2017-03-07 VITALS — BP 130/78 | HR 78 | Temp 98.2°F | Ht 68.0 in | Wt 174.0 lb

## 2017-03-07 DIAGNOSIS — M8080XD Other osteoporosis with current pathological fracture, unspecified site, subsequent encounter for fracture with routine healing: Secondary | ICD-10-CM

## 2017-03-07 DIAGNOSIS — K219 Gastro-esophageal reflux disease without esophagitis: Secondary | ICD-10-CM | POA: Diagnosis not present

## 2017-03-07 DIAGNOSIS — D869 Sarcoidosis, unspecified: Secondary | ICD-10-CM | POA: Diagnosis not present

## 2017-03-07 DIAGNOSIS — S32010D Wedge compression fracture of first lumbar vertebra, subsequent encounter for fracture with routine healing: Secondary | ICD-10-CM | POA: Diagnosis not present

## 2017-03-07 LAB — BASIC METABOLIC PANEL
BUN: 16 mg/dL (ref 6–23)
CALCIUM: 9.8 mg/dL (ref 8.4–10.5)
CHLORIDE: 102 meq/L (ref 96–112)
CO2: 32 meq/L (ref 19–32)
CREATININE: 0.78 mg/dL (ref 0.40–1.20)
GFR: 76.2 mL/min (ref >60.00)
GLUCOSE: 122 mg/dL — AB (ref 70–99)
Potassium: 4.3 mEq/L (ref 3.5–5.1)
SODIUM: 140 meq/L (ref 135–145)

## 2017-03-07 LAB — CBC WITH DIFFERENTIAL/PLATELET
BASOS PCT: 0.7 % (ref 0.0–3.0)
Basophils Absolute: 0.1 10*3/uL (ref 0.0–0.1)
Eosinophils Absolute: 0.1 10*3/uL (ref 0.0–0.7)
Eosinophils Relative: 0.8 % (ref 0.0–5.0)
HCT: 49.2 % — ABNORMAL HIGH (ref 36.0–46.0)
Hemoglobin: 16.2 g/dL — ABNORMAL HIGH (ref 12.0–15.0)
LYMPHS ABS: 1.2 10*3/uL (ref 0.7–4.0)
LYMPHS PCT: 13.7 % (ref 12.0–46.0)
MCHC: 32.9 g/dL (ref 30.0–36.0)
MCV: 92.9 fl (ref 78.0–100.0)
MONOS PCT: 5.2 % (ref 3.0–12.0)
Monocytes Absolute: 0.5 10*3/uL (ref 0.1–1.0)
NEUTROS ABS: 7.3 10*3/uL (ref 1.4–7.7)
NEUTROS PCT: 79.6 % — AB (ref 43.0–77.0)
PLATELETS: 261 10*3/uL (ref 150.0–400.0)
RBC: 5.29 Mil/uL — ABNORMAL HIGH (ref 3.87–5.11)
RDW: 13.5 % (ref 11.5–15.5)
WBC: 9.1 10*3/uL (ref 4.0–10.5)

## 2017-03-07 MED ORDER — SUCRALFATE 1 G PO TABS: 1.0000 g | ORAL_TABLET | Freq: Two times a day (BID) | ORAL | 2 refills | Status: DC

## 2017-03-07 NOTE — Patient Instructions (Signed)
Today we updated your med list in our EPIC system...    Continue your current medications the same...  Today we checked your follow up blood work on the Prednisone 5mg  daily...    We will contact you w/ the results when available...     We can then decide what to do w/ the Pred dose...  Keep up the good work w/ diet & exercise- but be careful, no falling allowed!!!  Call for any questions...  Let's plan a follow up visit in 3-60mo, sooner if needed for problems.Marland KitchenMarland Kitchen

## 2017-03-08 ENCOUNTER — Encounter: Payer: Self-pay | Admitting: Pulmonary Disease

## 2017-03-08 LAB — ANGIOTENSIN CONVERTING ENZYME: Angiotensin-Converting Enzyme: 53 U/L (ref 9–67)

## 2017-03-08 NOTE — Progress Notes (Signed)
Subjective:    Patient ID: Laura [name], female    DOB: November 02, 1940, 76 y.o.   MRN: [id number]  HPI 76 y/o WF here for a follow up visit... she has prob Sarcoidosis w/ hx Uveitis and BHA on CXR => Pred therapy started 12/2015 due to progressive CXT/ CT abnormality...   ~  SEE PREV EPIC NOTES FOR OLDER DATA >>   LABS 5/13:  FLP- at goals on Simva40;  Chems- wnl;  CBC- wnl;  TSH=1.61;  VitD=47  ADDENDUM>> she received TDAP w/ local reaction req antihist & topical Rx...  CXR 11/13 showed similar changes- irreg nodular changes in right suprahilar region, scarring, stable adenopathy, mild atherosclerotic calcif, osteopenia...  ~  Laura [name] reports that in Feb2014 she fell while getting up at night & hit her back w/ severe pain & L1 compression fx- adm to [facility] for 2d w/ kyphoplasty & told to have LLLpneumonia treated w/ Levaquin.   CXR 3/14 showed normal heart size, atherosclerotic Ao, chr scarring from underlying sarcoid, NAD...  LABS 5/14:  FLP- at goals on Simva40;  Chems- wnl;  CBC- wnl;  TSH=1.74;  VitD=52... ~   Seen by DrTaylor for Cards 3/15> 2DEcho showed norm LVF, norm valves, no signs of pulmHTN; subseq stress testing showed extreme dyspnea w/ exercise (poor functional capacity), no ischemia; she has been encouraged to join silver sneakers and incr her exercise program...   LABS 1/15:  Chems- ok x K=3.4;  CBC- wnl;  TSH=2.28;  Mag=2.0.Marland KitchenMarland Kitchen  CXR 1/15 showed norm heart size, areas of scarring appear unchanged, prom hilae w/o change- stable, NAD.Marland KitchenMarland Kitchen   EKG 3/15 showed NSR, rate79, wnl, NAD...  Stress Test 3/15 showed poor exercise capacity, no ischemia...  2DEcho 3/15 showed normal LVF w/ EF=55-60%, no regional wall motion abn, normal valves, no evid for pulmHTN...  PFTs 5/15 showed just some sm airways dis> FVC=2.65 (78%), FEV1=1.86 (72%); %1sec=70; mid-flows=55% predicted... After bronchodil her FEV1 improved 6%... Lung Volumes & DLCO are wnl...   LABS 5/15:  ACE  level = 55     LABS 11/15:  FLP- looks good on Simva40, needs to lose wt;  Chems- wnl;  CBC- wnl;  TSH=2.19;  VitD=52...   ~  Dec 06, 2014:  36moRReptonhad left elbow surg for a nodule & excision by DMelrose Nakayamashowed path=numerous nonnecrotizing granulomas, spec stains neg & this is c/w an unusual manifestation of her Sarcoid; she was told "scar tissue" and is improved now... We reviewed the following medical problems during today's office visit >>     Sarcoid> Hx abn CXR, she is asymptomatic; baseline CXR w/ right suprahilar nodule, adenop, scarring & chr changes; [place]; last CXR 1/15 was stable and last ACE=55; she had nodule excised from left elbow 4/16 by DrKuzma=> granulomatous nodule c/w unusual manifewstation of Sarcoid, all spec stains were neg...    HxMVP, palpit w/ PVCs & PACs on Holter> on ASA81;  She denies CP, palpit, SOB now; advised no caffeine & avoid stress!    Chol> on Simva40 & FishOil; last FLP 11/15 showed TChol 169, TG 135, HDL 41, LDL 101; reviewed diet & wt reduction...    GI> GERD, IBS, Polyp> on Prilosec40Bid, Carafate1GmQhs, & Zofran prn; followed by DrJacobs- she remains asymptomatic w/o abd pain, n/v, c/d, blood seen...    UTIs> she reports no prob on Cranberry juice but had one recurrent UTI 4/16- Cipro cqalled in & symptoms resolved..Marland KitchenMarland KitchenMarland Kitchen  DJD, LBP, L1 compression w/ kyphoplasty, Osteop> on calcium, MVI, VitD & Tramadol50; she fell 2/14 w/ L1 compression & kyphoplasty in [place]; BMD 2/16 w/ Tscore-1.8 & we rec ALENDRONATE70/wk...    Anxiety> she has a lot of family support, doesn't require meds... We reviewed prob list, meds, xrays and labs> see below for updates >>   CXR 5/16 showed stable BHA & scarring on right, NAD, no change...  LABS 5/16:  ACE=56, stable...  ~  June 08, 2015:  45moRFlat Rockhad f/u DrPyrtle recently- GERD, esoph spasm, IBS, colon polyps & +FamHx colon ca in sister> Rx w/ Nexium20Bid, Carafate  prn, Cardizem30 prn esoph spasm and she is improved ;  She also had f/u Cards DrTaylor 12/23/14- palpit w/ PVCs and PACs, improved and rec to f/u prn... Note that she is off prev Fosamax Rx- took it for 312mo stopped due to reflux- offered Binoso but she prefers off Rx (BMD 2/16- Tscore -1.8) & she takes Ca & VitD supplement... Prob list as above> EXAM shows Afeb, VSS, O2sat=95% on RA;  HEENT- neg, Mallampati1;  Chest- clear w/o w/r/r;  Heart- RR w/o m/r/g;  Abd- soft, nontender, neg;  Ext- neg w/o c/c/e;  Neuro- intact w/o focal abn...  LABS 06/07/16>  FLP- all parameters at goals on Simva40;  Chems- wnl;  CBC- wnl;  TSH=1.86;  VitD=52;  ACE=63   IMP/PLAN>>  [name] stable on current regimen- continue same meds and f/u in 70m9mo  ~  December 26, 2015:  6-29mo529mo Laura [name] asymptomatic, feeling well, and denies CP/ palpit. Cough/ phlegm/ hemoptysis, SOB, edema, etc;  She tells me that she got the Flu at the end of March noting HA, sore throat, severe cough w/o much sput, low grade temp, aching & sore, etc & was seen in the ER where CXR showed bilat hilar adenopathy & increased curvilinear thickening on the right but no acute process;  Flu panel was POS for TypeA flu;  She had f/u OV w/ TP shortly thereafter & given ZPak, Tessalon, Mucinex, Delsym, fluids;  She notes that it took her 8wks to fully resolve & get her energy back!  We reviewed the following medical problems during today's office visit >>     Ophthalmology> Hx uveitis, s/p bilat cat surg, [facility] at [facility]   Sarcoid> Hx uveitis & abn CXR w/ BHA, she is asymptomatic; baseline CXR w/ adenop, scarring & chr changes; h[place]; last CXR 5/16 was stable and last ACE=63; she had nodule excised from left elbow 4/16 by DrKuzma=> granulomatous nodule c/w unusual manifestation of Sarcoid, all spec stains were neg...    HxMVP, palpit w/ PVCs & PACs on Holter> on ASA81;  She denies CP, palpit, SOB now; advised no  caffeine & avoid stress!    Chol> on Simva40 & FishOil; last FLP 11/15 showed TChol 169, TG 135, HDL 41, LDL 101; reviewed diet & wt reduction...    GI> GERD, IBS, Polyp> on Prilosec40Bid, Carafate1GmQhs, & Zofran prn; followed by DrPyrtle- she remains asymptomatic w/o abd pain, n/v, c/d, blood seen...       ADDENDUM> she had colonoscopy 01/17/16> + for divertics, hemorrhoids, and several polyps- 4 to 70mm 35me & Path= sessile serrated adenomas & f/u suggested 3 yrs.    UTIs> she reports no prob on Cranberry juice but had one recurrent UTI 4/16- Cipro cqalled in & symptoms resolved....    DJD, LBP, L1 compression w/  kyphoplasty, Osteop> on calcium, MVI, VitD & Tramadol50; she fell 2/14 w/ L1 compression & kyphoplasty in [place]; BMD 2/16 w/ Tscore-1.8 & we rec ALENDRONATE70/wk...    Anxiety> she has a lot of family support, doesn't require meds... EXAM shows Afeb, VSS, O2sat=97% on RA;  HEENT- neg, Mallampati1;  Chest- clear w/o w/r/r;  Heart- RR w/o m/r/g;  Abd- soft, nontender, neg;  Ext- neg w/o c/c/e;  Neuro- intact w/o focal abn...  CXR 10/16/15> [name] have reviewed this film & compared to prev images>  bilat hilar adenopathy & increased opac in left superior hilum and right lat hilar region w/ extension into ant RUL (similar to 11/2014 films but progressed from previous CXRs) => we will proceed w/ f/u CT Chest...  LABS 10/16/15>  Flu panel showed POS FluA;  Neg FluB and H1N1...   LABS 12/2015>  ACE=62 (nl=8-52);  VitD=51;  BMet- ok x BS=131, Cr=0.74, Ca=9.4.Marland KitchenMarland Kitchen  CT Chest w/ contrast 01/06/16>  Norm heart size, aortic & coronary atherosclerosis, part calcif mediastinal adenopathy (unchanged from 2009), progression of bilat hilar adenopathy & pulm parenchymal changes w/ mass-like density in RML~4cm, and mass-like density in ant LUL ~4x2cm -- radiology feels this is c/w sarcoid progression.   Full PFTs 01/06/16>  FVC=2.66 (80%), FEV1=1.82 (72%), %1sec=68, mid-flows reduced at 53% predicted; post bronchodil  FEV1 improved 5% to 1.92;  TLC=5.57 (98%), RV=2.72 (108%), RV/TLC=49%;  DLCO=70% pred (DL/VA=96%). IMP/PLAN>>  Concern for active sarcoidosis w/ the recent CXR and CT changes; she has never had a biopsy due to remote presentation w/ BHA and uveitis and the fact that she has been devoid of resp symptoms, and prev had neg ACE levels (ie- no signs of dis activity);  Her recent CXR changes (progression), sl elev ACE in the low 60s, suggests some activity yet she remains stoic & asymptomatic;  She would like to proceed w/o lung Bx if poss & [name] discussed trial PREDNISONE w/ short term f/u to check ACE & CXR (if not improved then we will address need for lung bx at that time);  REC to start Pred40/d x2wks, 30/d x2wks, then 20/d til return in about 6wks time...   ~  February 22, 2016:  46moROV & f/u sarcoid> In Jun2017 we did CTChest showing norm heart size, aortic & coronary atherosclerosis, part calcif mediastinal adenopathy (unchanged from 2009), progression of bilat hilar adenopathy & pulm parenchymal changes w/ mass-like density in RML~4cm, and mass-like density in ant LUL ~4x2cm -- radiology feels this is c/w sarcoid progression;  ACE level= 62;  We discussed poss Bx but elected to start Pred therapy w/ 40-30-20 Q2wks and she returns today on Pred20/d noting various symptoms related to the Pred she feels eg- Ha, sweating, trembling, incr HR, & short tempered; these symptoms have diminished w/ decr in the dose; she also notes SOB, can't get a DB, & denies CP- but she has stepped up her walking program & doing better now;  We discussed checking ACE level & weaning Pred further based on this result...    EXAM shows Afeb, VSS, O2sat=97% on RA;  HEENT- neg, Mallampati1;  Chest- clear w/o w/r/r;  Heart- RR w/o m/r/g;  Abd- soft, nontender, neg;  Ext- neg w/o c/c/e;  Neuro- intact w/o focal abn...  LABS 02/2016>  BMet- ok w/ BS=100, A1c= 6.7, Cr=0.81, K=3.9;  ACE=23... IMP/PLAN>>  LKrystallseems somewhat improved on the Pred &  ACE is down to 23;  [name] suggest that she keep the Pred20/d for another 2wks  then cut it to 24m alt w/ 116mQod til return in 6 wks time...  ~  April 04, 2016:  6wk ROV & f/u sarcoid>  She reports stable on the Pred 20-10 Qod for the past month & doing better on the lower doses, BS better & wt stable at 170#- we reviewed low carb, low sodium diet;  She denies CP, SOB, cough, sput, hemoptysis, etc...    EXAM shows Afeb, VSS, O2sat=97% on RA;  HEENT- neg, Mallampati1;  Chest- clear w/o w/r/r;  Heart- RR w/o m/r/g;  Abd- soft, nontender, neg;  Ext- neg w/o c/c/e;  Neuro- intact w/o focal abn...  CXR 04/04/16>  Stable heart size, mild atherosclerosis of Ao, BHA & perihilar opacities sl decreased from prev films, no new airspace dis... IMP/PLAN>>  We decided to continue the Pred 2057mlt w/ 16m25md thru Oct & then decr to 16mg35mtarting in Nov until her f/u visit in 95mo w98mou CXR & Labs...  ~  June 26, 2016:  95mo RO84moLois reAveriannas on Pred 16mg/d 81mthe last month- she is still noting mult somatic complaints and blames the Pred=> we discussed change to MEDROL 8mg tabs86m see if she tolerates this better; she tells me she's been exercising 3d/wk at the gym & doing satis, no cough/ sputum, and dyspnea improving; she notes occas palpit "fluttering" and more aware of heart beat- [name] have tried to get her to take the Alpraz0.25mg tabs87m1/2 to1 tab Tid more regularly; we will recheck labs today, & she will need f/u CT Chest in early 2018 (519yr f/u sc68yr..    EXAM shows Afeb, VSS, O2sat=97% on RA;  HEENT- neg, Mallampati1;  Chest- clear w/o w/r/r;  Heart- RR w/o m/r/g;  Abd- soft, nontender, neg;  Ext- neg w/o c/c/e;  Neuro- intact w/o focal abn...  LABS 06/26/16> Chems- ok x BS=117;  ACE=28 IMP/PLAN>>  Laura [name] difficulty w/ the Pred but she is improved overall & ACE down to 28; we decided to change to MEDROL 8mg tabs ta34mgone Qam for Dec & cut to 8mg alternat82m w/ 4mg QOD start89m Jan2018=> we  will recheck her in Feb w/ f/u CTChest due and also due for 19yr recheck BM57yr Note: >50% of this 15min visit was56mnt in counseling and coordination of care.  ~  August 28, 2016:  47mo ROV & Laura i51moproved- no new complaints or concerns, she has maintained on Pred 10-5 Qod for her sarcoidosis;  She did not tolerate MEDROL saying that "it messed up my tummy" w/ cramps and diarrhea which resolved after switch to Pred...she denies cough, sput, SOB, CP, etc- no f/c/s... She is due for f/u CXR/ Labs today...    EXAM shows Afeb, VSS, O2sat=97% on RA;  HEENT- neg, Mallampati1;  Chest- clear w/o w/r/r;  Heart- RR w/o m/r/g;  Abd- soft, nontender, neg;  Ext- neg w/o c/c/e;  Neuro- intact w/o focal abn...  CXR 08/28/16 (independently reviewed by me in the PACS system) showed norm heart size, aortic atherosclerosis, similar BHA & incr markings unchanged from prev (by my review the markings are better than 09/2015 & similar to 9/17).   LABS 08/28/16>  Chems- ok x K=3.4, BS is wnl at 94, LFTs wnl, A1c=6.4,  ACE level = 24 => ok to cut Pred to 5mg po Qd... IMP/29mN>>  We will decr her Pred to 5mg daily, asked t39maintain her diet & exercise program, ROV in 95mo & consider CT8mo  f/u...  ~  Nov 27, 2016:  7moROV & f/u Sarcoidosis> LCherlynnreports that her breathing is good & she has no new complaints or concerns, she is exercising in a gym for 1H 3d/wk, doing zoomba etc; current Pred dose is 160malt w/ 69m81mod and she confirms- no cough, sput, or hemoptysis; her SOB/DOE is stable w/ no difficulty w/ ADLs but notes trouble w/ inclines and stairs; she is also under stress from mult fronts-- Jim's parkinsons & some mental changes, sisters LouBarbaraann ShareRubBertram Millardving to Car[employer] Hor[employer]oi[employer]ian tearful & has a support group)... NOTE: she reminds me that her brother had Sarcoid in his 40s44sery servere, almost died, [place]    She had f/u w/ PCP-DrHunter 10/18/16> establish care, hx Sarcoid, HL(on Simva40), DM  (A1c=6.4 on diet), GERD (on Nexium Bid), osteoporosis (referred to endocrine)...    She saw DrEllison 11/19/16 for osteoporosis> [facility] 2016 & stopped due to arthralgias, BMD 08/2016 showed lowest Tscore= -1.7 in RFN w/ incr risk;  REC to prevent falls, Ca 1200m78m VitD 400u/d, & start Prolia 2x/yr (SPE- neg, TSH-0.79, VitD-69, PTH=wnl at 25)...    EXAM shows Afeb, VSS, O2sat=100% on RA;  HEENT- neg, Mallampati1;  Chest- clear w/o w/r/r;  Heart- RR w/o m/r/g;  Abd- soft, nontender, neg;  Ext- neg w/o c/c/e;  Neuro- intact w/o focal abn... IMP/PLAN>>  We discussed decreasing her Pred from 10-5 Qod to 69mg/62m/ routine rov in 11mo; 48moso encouraged her use of the Prolia for her osteopenia + hx compression fx & need for Pred rx...   ~  March 07, 2017:  11mo RO68molast visit Laura [name] waKennetteing well- exercising at gym & w/ zoomba, asymptomatic from the Sarcoid standpoint on Pred 10 alt w/ 5 qod- so we decided to decr to 69mg dai54m.. She reports stable on the Pred 69mg/d ov46mthe last 11mo & not18moewer side effects like sweating, irritability, etc; her breathing is good- no cough/ sputum/ hemoptysis; DOE is similar noted esp w/ inclines & stairs, no worse, no CP etc...     We reviewed the following medical problems during today's office visit >>     Ophthalmology> Hx uveitis, s/p bilat cat surg, followed at [facility]...    [facility] Inccoid> Hx uveitis & abn CXR w/ BHA, she is asymptomatic; baseline CXR w/ adenop, scarring & chr changes; [place] Lev[place]; f/uCXR 5/16 was stable and ACE=63; she had nodule excised from left elbow 4/16 by DrKuzma=> granulomatous nodule c/w unusual manifestation of Sarcoid, all spec stains were neg... CT Chest 6/17 showed progression of bilat hilar adenopathy & pulm parenchymal changes w/ mass-like density in RML~4cm, and mass-like density in ant LUL ~4x2cm -- radiology feels this is c/w  sarcoid progression & ACE up to 63 => she declined bx & we started on Pred Rx=> CXR & ACE improved as we wean the Pred...    HxMVP, palpit w/ PVCs & PACs on Holter> on ASA81;  She denies CP, palpit, SOB now; advised no caffeine & avoid stress!    Chol> on Simva40 & FishOil; last FLP 11/16 showed TChol 161, TG 146, HDL 44, LDL 87; reviewed diet & wt reduction...    GI> GERD, IBS, Polyp> on Nexium200Bid, Carafate1GmQhs, & Zofran prn; followed by DrPyrtle- she remains asymptomatic w/o abd pain, n/v, c/d, blood seen...Marland KitchenMarland Kitchen  ADDENDUM> she had colonoscopy 01/17/16> + for divertics, hemorrhoids, and several polyps- 4 to 54m size & Path= sessile serrated adenomas & f/u suggested 3 yrs.    UTIs> she reports no prob on Cranberry juice but had one recurrent UTI 4/16- Cipro cqalled in & symptoms resolved....    DJD, LBP, L1 compression w/ kyphoplasty, Osteop> on calcium, MVI, VitD & Tramadol50; she fell 2/14 w/ [place] w/ Tscore-1.8 & we rec ALENDRONATE70/wk=> she stopped & Endocrine consult DrEllisonn rec Prolia Q657mo.    Anxiety> she has a lot of family issues, on Xanax 0.25 prn... EXAM shows Afeb, VSS, O2sat=97% on RA;  HEENT- neg, Mallampati1;  Chest- clear w/o w/r/r;  Heart- RR w/o m/r/g;  Abd- soft, nontender, neg;  Ext- neg w/o c/c/e;  Neuro- intact w/o focal abn...  LABS 03/07/17>  ACE level = 53 (up from 24-28 range, but still wnl w/ norm range=9/67);  BMet- ok x BS=122;  CBC- OK w/ Hg=16.2, WBC=9.1 IMP/PLAN>>  With the incr in ACE to 53- we decided to continue the Pred 69m60maily; she is asked to maintain her exercise program etc; she will call prn any resp problems and we plan ROV recheck in 3-64mo31mo     NOTE:  >505 of this 269mi78mV was spent in counseling 7 coordination of care...           Problem List:  Hx of UVEITIS (ICD-364.3) [facility], given [facility] injection in 2002... s/p right cataract surg 2/09 and no active uveitis seen... ~   4/11:  she tells me she was seen 10/10 & doing well, may need left cataract surg soon. ~  [facility]  DrDickinson noted hx uveitis- steroid responder, s/p bilat cat surg w/ lens replacement, epiretinal membrane & post vitreous detachments...   R/O SARCOIDOSIS (ICD-135) - [facility] sub[facility]/u CX[facility] w/ CTChest showing bilat hilar adenopathy, and w/u showing neg PPD, ACE=53, sed=13... prev PFT's 1/08 showed FVC 3.75 (111%), FEV1 2.74 (102%), %1sec=73, mid-flows=70%... being followed w/ serial films and "watchful waiting"... there is a family hx of sarcoidosis in one brother (severe dis w/ neuropathy). ~   we had a f/u CXR in Feb09- streaky opacity right mid lung zone, similar to 2008...  ~  CT Chest 7/08 & 6/09 showed mediastinal > hilar adenopathy some w/ calcif (generally smaller than 1/08), area of atx/ scarring right mid zone (infer RUL) & scat <1cm nodules in lower zones w/o change... ~  CXR 2/10 showed bilat hilar prominence & scarring appears the same- NAD... labs all WNL w/ ACE=56. ~  CXR 4/11 showed mild bilat hilar prominence, scarring, NAD- osteopenia & DJD in TSpine... ACE= 37. ~  CXR 4/12 showed stable bilat hilar prominence, scarring, NAD... ~ Marland KitchenCXR 11/13 showed similar changes- irreg nodular changes in right suprahilar region, scarring, stable adenopathy, mild atherosclerotic calcif, osteopenia... ~  CXR 3/14 showed normal heart size, atherosclerotic Ao, chr scarring from underlying sarcoid, NAD... ~ Marland KitchenCXR 1/15 showed norm heart size, areas of scarring appear unchanged, prom hilae w/o change- stable, NAD ~  PFTs 5/15 showed just some sm airways dis> FVC=2.65 (78%), FEV1=1.86 (72%); %1sec=70; mid-flows=55% predicted... After bronchodil her FEV1 improved 6%... Lung Volumes & DLCO are wnl. ~  LABS 5/15:  ACE level = 55  ~  4/16: she had nodule excised from left elbow 4/16 by DrKuzma=> granulomatous nodule c/w unusual  manifewstation of  Sarcoid, all spec stains were neg... ~  5/16: CXR 5/16 showed stable BHA & scarring on right, NAD, no change... LABS 5/16:  ACE=56, stable. ~  2017>  +FluA illness 09/2015 w/ ER eval & CXR showing progressive abn;  CT Chest 12/2015 confirmed progression of BHA & LUL/RML parenchymal densities felt to be c/w active Sarcoid, ACE=62;  Pt wanted to avoid Bx if poss & we decided on a course of Pred 40-30-20 Q2wk taper w/ ROV in 6wks for f/u=> ACE improved to 23 and we weaned Pred slowly... ~  12/17: Pt perceives several issues w2/ the Pred (now down to 91m/d) so we will switch to Medrol838mtabs and continue slow weaning.. Marland KitchenPALPITATIONS >>  Hx MITRAL VALVE PROLAPSE >> on ASA 817m... clinical dx w/ 2DEcho 1999 showing flat closure but no frank prolapse... ~  2/16: she had a cardiac eval by DrTaylor for dyspnea> 2DEcho was neg, wnl; stress testing was neg- w/o ischemia; noted to have poor functional capacity & is way too sedentary; she had some palpit & monitor revealed PVCs & PACs- didn't want meds therefore asked to avoid caffeine 7 stress...  HYPERCHOLESTEROLEMIA (ICD-272.0) - on SIMVASTATIN 2m105mnow... ~  FLP [place]8 on Lip10 showed TChol 183, TG 122, HDL 45, LDL 114...  ~  FLP [place]9 on Lip10 showed TChol 183, TG 189, HDL 39, LDL 107... continue diet & change to Simva40.  ~  FLP 5/09 on Simva40 showed TChol 185, TG 163, HDL 36, LDL 117... she wants to stay on Simva40. ~  FLP 2/10 showed TChol 176, Tg 160, HDL 39, LDL 105... rec> continue same. ~  FLP 4/11 showed TChol 160, TG 131, HDL 41, LDL 93 ~  FLP 4/12 showed TChol 174, TG 148, HDL 43, LDL 102 ~  FLP 5/13 showed TChol 159, TG 151, HDL 47, LDL 82 ~  FLP 5/14 on Simva40 showed TChol 165, TG 157, HDL 41, LDL 93 ~  FLP 11/15 on Simva40 showed TChol 169, TG 135, HDL 41, LDL 101 ~  FLP 11/16 on Simva40 showed TChol 161, TG 146, HDL 44, LDL 87  GERD (ICD-530.81) - on NEXIUM 2mg43m ZANTAC 300mgQ30m. ~  EGD 6/07 w/ 3cmHH, stricture, gastitis (HPylori  neg)... [name] offered to change her Nexium to a generic but she declined- "DrPatterson told me never to change this med"... she notes occas nocturnal reflux symptoms. ~  5/12: She had GI eval by DrPatterson 5/12> chr GERD, hx 3cmHH, prev stricture dilated, hx colon polyps, etc> c/o incr reflux symptoms, bloating, pressure despite her Nexium40AM & Zantac300PM;  Nexium was increased to Bid, Carafate added Qid & she had EGD 5/12 showing mod gastritis w/ bx= chr inflamm, neg HPylori... ~  CTAbd 5/12 showed mult parenchymal nodules at lung bases (some fractionally larger), calcif hilar nodes bilat, no renal lesions (?left upper pole mass seen on sonar?). ~  EGD 3/13 by DrPatterson showed 3cmHH, esophagitis, prob "pill espohagitis", dilated... Symptoms resolved w/ Carafate, Nexium, Zantac, Xylocaine... ~  5/14: on Nexium40Bid, Zantac300HS, CarafateQid prn, & Zofran prn; she remains asymptomatic w/o abd pain, n/v, c/d, blood seen... ~  11/15: on Nexium40, Carafate1GmQhs, & Zofran prn; she has seen DrPyrtle & remains asymptomatic on these meds- w/o abd pain, n/v, c/d, blood seen...  IRRITABLE BOWEL SYNDROME (ICD-564.1) COLONIC POLYPS, HX OF (ICD-V12.72)  ~  Colonoscopy 6/07 by DrPatterson showing divertics & polyps (adenomatous)... f/u planned 51yrs..34yr She had f/u colonoscopy 5/12 showing mod divertics, sessile polyp  in cecum= serrated adenoma w/ f/u planned 3-58yr...  UTI'S, CHRONIC (ICD-599.0) - eval by DrOttelin for urology... now off the Macrodantin and using cranberry juice without recurrent UTI, no problems...  DEGENERATIVE JOINT DISEASE (ICD-715.90) - eval by DrDalldorf w/ mod DJD in knees & hx of torn left meniscus... 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 overall improved now. ~  11/13:  TRAMADOL 564mTid refilled for prn use... ~  2015-16: [facility]> right & left knee DJD,  right ankle tendonitis, right plantar fasciitis...  ~  4/16: she had right elbow surg by DrKuzma> mult granulomas on path c/w sarcoid nodule...  LOW BACK PAIN, CHRONIC (ICD-724.2) ~  2/14: she fell while getting up at night & hit her back w/ severe pain & L1 [facility] & [facility] pain relief...  OSTEOPENIA (ICD-733.90) - on Calcium, MVI, Vit D... ~  BMD in 2005 showed TScore -1.2 [facility] Spine, & [facility]  BMD 5/09 showed TScores -1.5 in Spine & -1.1 in FemNecks. ~  BMD here 5/11 showed TScores -1.2 in Spine, [facility]  5/13 & 5/14:  She is due for f/u BMD but wants to wait for now... ~  Labs 11/15 showed VitD= 52... ~  BMD 2/16 revealed Tscore -1.8 in Lspine and Rt FemNeck; we rec FOSAMAX70/wk in light of her prev compression fx=> pt stopped the Bisphos after several months due to reflux symptms and refuses alternative med... ~  She is rec to take Calcium, MVI, VitD supplement, & wt bearing exercise...  ANXIETY (ICD-300.00) >> given ALPRAZOLAM 0.2563mabs w/ directions to try 1/2 to 1 tab Tid more regularly...  Hx of BASAL CELL SKIN CANCER - [place]...  Health Maintenance: ~  GI:  followed by DrPatterson/Pyrtle w/ colon as above... ~  GYN:  followed by DrRichardson & doing well by pt's report... [facility] ~  Immunizations:  she gets yearly flu shots;  has PNEUMOVAX in 1998 & repeated 10/11 (age [age] TDAP given 5/13;  we discussed Shingles vaccine (she had bout of shingles involving her right hip & leg ~age30) & she will decide.    Past Surgical History:  Procedure Laterality Date  . ABDOMINAL HYSTERECTOMY    . BASAL CELL CARCINOMA EXCISION    . CATARACT EXTRACTION Bilateral   . COLONOSCOPY  2012  . MASS EXCISION Left 11/02/2014   Procedure: EXCISION MASS LEFT ELBOW;  Surgeon: GarDaryll BrodD;  [facility];   Laterality: Left;  . POLYPECTOMY    . UPPER GASTROINTESTINAL ENDOSCOPY    . VESICOVAGINAL FISTULA CLOSURE W/ TAH     "sling" after hystrectomy per patient    Outpatient Encounter Prescriptions as of 03/07/2017  Medication Sig  . ALPRAZolam (XANAX) 0.25 MG tablet Take 1/2 to 1 tablet by mouth three times daily  . aspirin 81 MG tablet Take 81 mg by mouth daily.    . B Complex Vitamins (VITAMIN-B COMPLEX PO) Take 1 capsule by mouth daily.   . Calcium Carbonate (CALTRATE 600 PO) Take 1 tablet by mouth 2 (two) times daily.    . cholecalciferol (VITAMIN D) 1000 units tablet Take 1,000 Units by mouth daily.  . Esomeprazole Magnesium (NEXIUM 24HR) 20 MG TBEC Take 1  tablet by mouth 2 (two) times daily.  . Glucosamine HCl (GLUCOSAMINE PO) Liquid form - Take by mouth as directed once daily  . meclizine (ANTIVERT) 25 MG tablet Take 1/2-1 tablet by mouth every 4 hours as needed for dizziness  . Multiple Vitamins-Minerals (MULTIVITAL) tablet Take 1 tablet by mouth daily.    . Omega-3 Fatty Acids (FISH OIL) 1000 MG CAPS Take 1 capsule by mouth daily.    . ondansetron (ZOFRAN) 8 MG tablet 1 tablet by mouth every 6 hours as needed for nausea  . predniSONE (DELTASONE) 5 MG tablet Take 1 tablet (5 mg total) by mouth daily with breakfast.  . simvastatin (ZOCOR) 40 MG tablet Take 1 tablet (40 mg total) by mouth at bedtime.  . sucralfate (CARAFATE) 1 g tablet Take 1 tablet (1 g total) by mouth 2 (two) times daily.  . [DISCONTINUED] sucralfate (CARAFATE) 1 g tablet Take 1 tablet (1 g total) by mouth 2 (two) times daily.   No facility-administered encounter medications on file as of 03/07/2017.     Allergies  Allergen Reactions  . 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 Amoxicillin-Pot Clavulanate     REACTION: causes diarrhea  . Codeine     REACTION: nausea  . Levaquin [Levofloxacin In D5w]     Had itching w/  intravenous, not sure if reaction was from abx or the need to change the IV.  Does well when taken with Benadryl. Pt states can take orally with no issues   . Tdap [Diphth-Acell Pertussis-Tetanus]     Area raised, warm to touch, tender, swollen, pt felt jittery, nausea and some SOB  . Tape Rash    Blisters skin and removes skin --NEEDS PAPER TAPE    Immunization History  Administered Date(s) Administered  . Influenza Split 05/23/2011, 05/23/2012, 05/04/2013, 05/31/2014  . Influenza Whole 09/11/2009, 05/08/2010  . Influenza, High Dose Seasonal PF 05/02/2016  . Influenza-Unspecified 05/25/2015  . Pneumococcal Polysaccharide-23 05/17/2010  . Tdap 11/21/2011   Current Medications, Allergies, Past Medical History, Past Surgical History, Family History, and Social History were reviewed in [employer] record.   Review of Systems         See HPI - all other systems neg except as noted... The patient denies anorexia, fever, weight loss, weight gain, vision loss, decreased hearing, hoarseness, chest pain, syncope, dyspnea on exertion, peripheral edema, prolonged cough, headaches, hemoptysis, abdominal pain, melena, hematochezia, severe indigestion/heartburn, hematuria, incontinence, muscle weakness, suspicious skin lesions, transient blindness, difficulty walking, depression, unusual weight change, abnormal bleeding, enlarged lymph nodes, and angioedema.     Objective:   Physical Exam    WD, WN, 76 y/o WF in NAD... GENERAL:  Alert & oriented; pleasant & cooperative... HEENT:  [place]/AT, EOM-wnl, PERRLA, EACs-clear, TMs-wnl, NOSE-clear, THROAT-clear & wnl. NECK:  Supple w/ fairROM; no JVD; normal carotid impulses w/o bruits; no thyromegaly or nodules palpated; no lymphadenopathy. CHEST:  Clear to P & A; without wheezes/ rales/ or rhonchi. HEART:  Regular Rhythm; without murmurs/ rubs/ or gallops. ABDOMEN:  Soft & nontender; normal bowel sounds; no organomegaly or masses  detected. EXT: without deformities, mild arthritic changes; no varicose veins/ +venous insuffic/ no edema. NEURO:  CN's intact;  no focal neuro deficits... DERM:  No lesions noted; no rash etc...  RADIOLOGY DATA:  Reviewed in the EPIC EMR & discussed w/ the patient...  LABORATORY  DATA:  Reviewed in the EPIC EMR & discussed w/ the patient...   Assessment & Plan:    DYSPNEA w/ thorough PULM & CARDIAC eval>> underlying sarcoidosis w/ mild pulm fibrosis & prev no signs of dis activity- on watchful waiting protocol; Cards eval by DrTaylor was neg as well x PVCs & PACs; she has improved on a gradual exercise program, avoids caffeine & stress...  SARCOID>  Previous CXR/ PFT/ ACE stable & she remains asymptomatic, nodule left elbow excised & path revealed nonnecrotizing granulomata... ~  12/2015>  FluA illness 09/2015 w/ CXR via ER showed some progression of the BHA & parenchymal abnormalities; CT Chest 12/2015 confirmed & ACE=62; We decided to start Rx w/ PRED 40-30-20 Q2wk taper w/ ROV/ in 6wks. ~  02/2016>  Clinically ?sl better w/ a few Pred side effects, ACE improved to 23 & we decided to cut the Pred to 20-10 Qod til ret 6wks... ~  03/2016>  CXR shows sl improvement on the Pred 20-10Qod;  We decided to continue this dose thru Oct & decr to 47m/d starting in Nov w/ ROV in 327mo~  06/2016>  LoJlynerceives some difficulty w/ the Pred but she is improved overall & ACE down to 28; we decided to change to MEDROL 7m61mabs taking one Qam for Dec & cut to 7mg29mternating w/ 4mg 41m starting Jan2018=> we will recheck her in Feb w/ f/u CTChest due and also due for 52yr r252yrck BMD ~  11/27/16>   We discussed decreasing her Pred from 10-5 Qod to 5mg/d 29mroutine rov in 63mo ~  57mo/18>   With the incr in ACE to 53- we decided to continue the Pred 5mg dail51mshe is asked to maintain her exercise program etc; she will call prn any resp problems and we plan ROV recheck in 3-60mo...   46mo>  Stable on the Simva40 +  diet...  GERD>  On Prilosec & Zantac, she is currently improved from her prev symptoms...  DJD/ LBP/ Osteopenia>  As noted she remains stable on current meds & exercise program; she will see DrDaldorf about her ankle pain; BMD 2/16 w/ Tscore -1.8 & rec to start [email or web address][place]> she is s/p L1 kyphoplasty...  Other medical issues as noted...   Patient's Medications  New Prescriptions   No medications on file  Previous Medications   ALPRAZOLAM (XANAX) 0.25 MG TABLET    Take 1/2 to 1 tablet by mouth three times daily   ASPIRIN 81 MG TABLET    Take 81 mg by mouth daily.     B COMPLEX VITAMINS (VITAMIN-B COMPLEX PO)    Take 1 capsule by mouth daily.    CALCIUM CARBONATE (CALTRATE 600 PO)    Take 1 tablet by mouth 2 (two) times daily.     CHOLECALCIFEROL (VITAMIN D) 1000 UNITS TABLET    Take 1,000 Units by mouth daily.   ESOMEPRAZOLE MAGNESIUM (NEXIUM 24HR) 20 MG TBEC    Take 1 tablet by mouth 2 (two) times daily.   GLUCOSAMINE HCL (GLUCOSAMINE PO)    Liquid form - Take by mouth as directed once daily   MECLIZINE (ANTIVERT) 25 MG TABLET    Take 1/2-1 tablet by mouth every 4 hours as needed for dizziness   MULTIPLE VITAMINS-MINERALS (MULTIVITAL) TABLET    Take 1 tablet by mouth daily.     OMEGA-3 FATTY ACIDS (FISH OIL) 1000 MG CAPS    Take 1 capsule by mouth daily.  ONDANSETRON (ZOFRAN) 8 MG TABLET    1 tablet by mouth every 6 hours as needed for nausea   PREDNISONE (DELTASONE) 5 MG TABLET    Take 1 tablet (5 mg total) by mouth daily with breakfast.   SIMVASTATIN (ZOCOR) 40 MG TABLET    Take 1 tablet (40 mg total) by mouth at bedtime.  Modified Medications   Modified Medication Previous Medication   SUCRALFATE (CARAFATE) 1 G TABLET sucralfate (CARAFATE) 1 g tablet      Take 1 tablet (1 g total) by mouth 2 (two) times daily.    Take 1 tablet (1 g total) by mouth 2 (two) times daily.  Discontinued Medications   No medications on file

## 2017-03-16 ENCOUNTER — Ambulatory Visit (INDEPENDENT_AMBULATORY_CARE_PROVIDER_SITE_OTHER): Payer: Medicare Other | Admitting: Family Medicine

## 2017-03-16 ENCOUNTER — Encounter: Payer: Self-pay | Admitting: Family Medicine

## 2017-03-16 ENCOUNTER — Other Ambulatory Visit (HOSPITAL_COMMUNITY)
Admission: RE | Admit: 2017-03-16 | Discharge: 2017-03-16 | Disposition: A | Discharge status: Home / Self Care | Payer: Medicare Other | Source: Other Acute Inpatient Hospital | Attending: Family Medicine | Admitting: Family Medicine

## 2017-03-16 VITALS — BP 142/76 | HR 83 | Temp 98.2°F | Wt 176.0 lb

## 2017-03-16 DIAGNOSIS — N3001 Acute cystitis with hematuria: Secondary | ICD-10-CM | POA: Diagnosis not present

## 2017-03-16 DIAGNOSIS — R300 Dysuria: Secondary | ICD-10-CM

## 2017-03-16 LAB — POCT URINALYSIS DIPSTICK
Bilirubin, UA: NEGATIVE
Blood, UA: 10
Glucose, UA: NEGATIVE
Ketones, UA: NEGATIVE
Nitrite, UA: NEGATIVE
Protein, UA: NEGATIVE
Spec Grav, UA: 1.02 (ref 1.010–1.025)
Urobilinogen, UA: 0.2 E.U./dL
pH, UA: 6 (ref 5.0–8.0)

## 2017-03-16 MED ORDER — CIPROFLOXACIN HCL 250 MG PO TABS: 250.0000 mg | ORAL_TABLET | Freq: Two times a day (BID) | ORAL | 0 refills | Status: AC

## 2017-03-16 MED ORDER — PHENAZOPYRIDINE HCL 100 MG PO TABS: 100.0000 mg | ORAL_TABLET | Freq: Three times a day (TID) | ORAL | 0 refills | Status: DC | PRN

## 2017-03-16 NOTE — Progress Notes (Signed)
Laura [name] is a 76 y.o. female here for an acute visit.  History of Present Illness:   Shaune Pascal CMA acting as scribe for Dr. Juleen China.  HPI: Patient comes in today for dysuria. She has been traveling so has not seen a doctor. Symptoms started a couple days ago. Genito-Urinary ROS: positive for - dysuria and hematuria, negative for - abdominal pain.  PMHx, SurgHx, SocialHx, Medications, and Allergies were reviewed in the Visit Navigator and updated as appropriate.  Current Medications:   Current Outpatient Prescriptions:  .  ALPRAZolam (XANAX) 0.25 MG tablet, Take 1/2 to 1 tablet by mouth three times daily, Disp: 90 tablet, Rfl: 5 .  aspirin 81 MG tablet, Take 81 mg by mouth daily.  , Disp: , Rfl:  .  B Complex Vitamins (VITAMIN-B COMPLEX PO), Take 1 capsule by mouth daily. , Disp: , Rfl:  .  Calcium Carbonate (CALTRATE 600 PO), Take 1 tablet by mouth 2 (two) times daily.  , Disp: , Rfl:  .  cholecalciferol (VITAMIN D) 1000 units tablet, Take 1,000 Units by mouth daily., Disp: , Rfl:  .  Esomeprazole Magnesium (NEXIUM 24HR) 20 MG TBEC, Take 1 tablet by mouth 2 (two) times daily., Disp: , Rfl:  .  Glucosamine HCl (GLUCOSAMINE PO), Liquid form - Take by mouth as directed once daily, Disp: , Rfl:  .  meclizine (ANTIVERT) 25 MG tablet, Take 1/2-1 tablet by mouth every 4 hours as needed for dizziness, Disp: , Rfl:  .  Multiple Vitamins-Minerals (MULTIVITAL) tablet, Take 1 tablet by mouth daily.  , Disp: , Rfl:  .  Omega-3 Fatty Acids (FISH OIL) 1000 MG CAPS, Take 1 capsule by mouth daily.  , Disp: , Rfl:  .  ondansetron (ZOFRAN) 8 MG tablet, 1 tablet by mouth every 6 hours as needed for nausea, Disp: 25 tablet, Rfl: 5 .  predniSONE (DELTASONE) 5 MG tablet, Take 1 tablet (5 mg total) by mouth daily with breakfast., Disp: 90 tablet, Rfl: 1 .  simvastatin (ZOCOR) 40 MG tablet, Take 1 tablet (40 mg total) by mouth at bedtime., Disp: 90 tablet, Rfl: 2 .  sucralfate (CARAFATE) 1 g  tablet, Take 1 tablet (1 g total) by mouth 2 (two) times daily., Disp: 60 tablet, Rfl: 2   Allergies  Allergen Reactions  . 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 Amoxicillin-Pot Clavulanate     REACTION: causes diarrhea  . Codeine     REACTION: nausea  . Levaquin [Levofloxacin In D5w]     Had itching w/ intravenous, not sure if reaction was from abx or the need to change the IV.  Does well when taken with Benadryl. Pt states can take orally with no issues   . Tdap [Diphth-Acell Pertussis-Tetanus]     Area raised, warm to touch, tender, swollen, pt felt jittery, nausea and some SOB  . Tape Rash    Blisters skin and removes skin --NEEDS PAPER TAPE   Review of Systems:   Pertinent items are noted in the HPI. Otherwise, ROS is negative.  Vitals:   Vitals:   03/16/17 1047  BP: (!) 142/76  Pulse: 83  Temp: 98.2 F (36.8 C)  TempSrc: Oral  SpO2: 94%  Weight: 176 lb (79.8 kg)     Body mass index is 26.76 kg/m.   Physical Exam:   Physical Exam  Constitutional: She  appears well-nourished.  HENT:  Head: Normocephalic and atraumatic.  Eyes: Pupils are equal, round, and reactive to light. EOM are normal.  Neck: Normal range of motion. Neck supple.  Cardiovascular: Normal rate, regular rhythm, normal heart sounds and intact distal pulses.   Pulmonary/Chest: Effort normal.  Abdominal: Soft.  Skin: Skin is warm.  Psychiatric: She has a normal mood and affect. Her behavior is normal.  Nursing note and vitals reviewed.  Results for orders placed or performed in visit on 03/16/17  POCT urinalysis dipstick  Result Value Ref Range   Color, UA yellow    Clarity, UA cloudy    Glucose, UA neg    Bilirubin, UA neg    Ketones, UA neg    Spec Grav, UA 1.020 1.010 - 1.025   Blood, UA 10 ery    pH, UA 6.0 5.0 - 8.0   Protein, UA neg    Urobilinogen, UA 0.2 0.2 or 1.0 E.U./dL   Nitrite, UA neg     Leukocytes, UA Moderate (2+) (A) Negative   Assessment and Plan:   Diagnoses and all orders for this visit:  Dysuria -     POCT urinalysis dipstick  Acute cystitis with hematuria -     ciprofloxacin (CIPRO) 250 MG tablet; Take 1 tablet (250 mg total) by mouth 2 (two) times daily. -     phenazopyridine (PYRIDIUM) 100 MG tablet; Take 1 tablet (100 mg total) by mouth 3 (three) times daily as needed for pain. -     Urine Culture   . Reviewed expectations re: course of current medical issues. . Discussed self-management of symptoms. . Outlined signs and symptoms indicating need for more acute intervention. . Patient verbalized understanding and all questions were answered. [name] Health Maintenance issues including appropriate healthy diet, exercise, and smoking avoidance were discussed with patient. . See orders for this visit as documented in the electronic medical record. . Patient received an After Visit Summary.  CMA served as Education administrator during this visit. History, Physical, and Plan performed by medical provider. The above documentation has been reviewed and is accurate and complete. Briscoe Deutscher, D.O.  Briscoe Deutscher, DO , [place] 03/16/2017  Future Appointments Date Time Provider [place]  05/02/2017 10:15 AM Marin Olp, MD [facility] None  06/11/2017 10:30 AM Noralee Space, MD [facility] None  11/18/2017 2:00 PM Renato Shin, MD [facility] None

## 2017-03-19 LAB — URINE CULTURE: Culture: 20000 — AB

## 2017-05-02 ENCOUNTER — Ambulatory Visit: Payer: Medicare Other | Admitting: Family Medicine

## 2017-05-08 ENCOUNTER — Ambulatory Visit (INDEPENDENT_AMBULATORY_CARE_PROVIDER_SITE_OTHER): Payer: Medicare Other | Admitting: Family Medicine

## 2017-05-08 ENCOUNTER — Encounter: Payer: Self-pay | Admitting: Family Medicine

## 2017-05-08 VITALS — BP 118/66 | HR 61 | Temp 97.8°F | Ht 68.0 in | Wt 173.8 lb

## 2017-05-08 DIAGNOSIS — R739 Hyperglycemia, unspecified: Secondary | ICD-10-CM

## 2017-05-08 DIAGNOSIS — M8080XD Other osteoporosis with current pathological fracture, unspecified site, subsequent encounter for fracture with routine healing: Secondary | ICD-10-CM

## 2017-05-08 DIAGNOSIS — Z23 Encounter for immunization: Secondary | ICD-10-CM | POA: Diagnosis not present

## 2017-05-08 DIAGNOSIS — T380X5A Adverse effect of glucocorticoids and synthetic analogues, initial encounter: Secondary | ICD-10-CM | POA: Diagnosis not present

## 2017-05-08 DIAGNOSIS — E785 Hyperlipidemia, unspecified: Secondary | ICD-10-CM

## 2017-05-08 LAB — MICROALBUMIN / CREATININE URINE RATIO
CREATININE, U: 61.4 mg/dL
Microalb Creat Ratio: 1.1 mg/g (ref 0.0–30.0)

## 2017-05-08 LAB — HEPATIC FUNCTION PANEL
ALT: 13 U/L (ref 0–35)
AST: 17 U/L (ref 0–37)
Albumin: 4.1 g/dL (ref 3.5–5.2)
Alkaline Phosphatase: 71 U/L (ref 39–117)
BILIRUBIN TOTAL: 1.4 mg/dL — AB (ref 0.2–1.2)
Bilirubin, Direct: 0.2 mg/dL (ref 0.0–0.3)
Total Protein: 6.6 g/dL (ref 6.0–8.3)

## 2017-05-08 LAB — HEMOGLOBIN A1C: HEMOGLOBIN A1C: 6.1 % (ref 4.6–6.5)

## 2017-05-08 LAB — LDL CHOLESTEROL, DIRECT: Direct LDL: 91 mg/dL

## 2017-05-08 MED ORDER — SIMVASTATIN 40 MG PO TABS: 40.0000 mg | ORAL_TABLET | Freq: Every day | ORAL | 3 refills | Status: DC

## 2017-05-08 NOTE — Patient Instructions (Signed)
No changes today  As long as a1c 6.4 or less will keep this as steroid induced hyperglycemia (high blood sugar from prednisone)  Thanks for doing your flu shot - plan to give final pneumonia shot at physical

## 2017-05-08 NOTE — Progress Notes (Signed)
Subjective:  Laura [name] is a 76 y.o. year old very pleasant female patient who presents for/with See problem oriented charting ROS- no fever. No chest pain. No shortness of breathno hypoglycemia or hyperglycemis  Past Medical History-  Patient Active Problem List   Diagnosis Date Noted  . Steroid-induced hyperglycemia 10/18/2016    Priority: High  . Sarcoidosis 11/21/2011    Priority: High  . Hyperlipidemia 10/18/2016    Priority: Medium  . Osteoporosis 12/06/2014    Priority: Medium  . Heart palpitations 07/26/2014    Priority: Medium  . Dyspnea 08/20/2013    Priority: Medium  . GERD with stricture 10/12/2011    Priority: Medium  . Uveitis 09/21/2007    Priority: Medium  . Recurrent UTI 09/09/2007    Priority: Medium  . Anxiety state 08/06/2007    Priority: Medium  . History of colonic polyps 08/06/2007    Priority: Medium  . History of basal cell cancer 10/18/2016    Priority: Low  . Compression fx, lumbar spine ([place]) 09/24/2012    Priority: Low  . Pneumonia 09/24/2012    Priority: Low  . Osteoarthritis 09/21/2007    Priority: Low  . IBS (irritable bowel syndrome) 08/06/2007    Priority: Low  . LOW BACK PAIN, CHRONIC 08/06/2007    Priority: Low    Medications- reviewed and updated Current Outpatient Prescriptions  Medication Sig Dispense Refill  . ALPRAZolam (XANAX) 0.25 MG tablet Take 1/2 to 1 tablet by mouth three times daily 90 tablet 5  . aspirin 81 MG tablet Take 81 mg by mouth daily.      . B Complex Vitamins (VITAMIN-B COMPLEX PO) Take 1 capsule by mouth daily.     . Calcium Carbonate (CALTRATE 600 PO) Take 1 tablet by mouth 2 (two) times daily.      . cholecalciferol (VITAMIN D) 1000 units tablet Take 1,000 Units by mouth daily.    . Esomeprazole Magnesium (NEXIUM 24HR) 20 MG TBEC Take 1 tablet by mouth 2 (two) times daily.    . Glucosamine HCl (GLUCOSAMINE PO) Liquid form - Take by mouth as directed once daily    . meclizine (ANTIVERT) 25 MG  tablet Take 1/2-1 tablet by mouth every 4 hours as needed for dizziness    . Multiple Vitamins-Minerals (MULTIVITAL) tablet Take 1 tablet by mouth daily.      . Omega-3 Fatty Acids (FISH OIL) 1000 MG CAPS Take 1 capsule by mouth daily.      . ondansetron (ZOFRAN) 8 MG tablet 1 tablet by mouth every 6 hours as needed for nausea 25 tablet 5  . predniSONE (DELTASONE) 5 MG tablet Take 1 tablet (5 mg total) by mouth daily with breakfast. 90 tablet 1  . simvastatin (ZOCOR) 40 MG tablet Take 1 tablet (40 mg total) by mouth at bedtime. 90 tablet 3  . sucralfate (CARAFATE) 1 g tablet Take 1 tablet (1 g total) by mouth 2 (two) times daily. 60 tablet 2   No current facility-administered medications for this visit.     Objective: BP 118/66 (BP Location: Left Arm, Patient Position: Sitting, Cuff Size: Large)   Pulse 61   Temp 97.8 F (36.6 C) (Oral)   Ht 5\' 8"  (1.727 m)   Wt 173 lb 12.8 oz (78.8 kg)   SpO2 95%   BMI 26.43 kg/m  Gen: NAD, resting comfortably Oropharynx normal. Mucous membranes are moist. CV: RRR no murmurs rubs or gallops Lungs: CTAB no crackles, wheeze, rhonchi Abdomen: soft/nontender/nondistended. overweight Ext:  no edema Skin: warm, dry, no rash  Assessment/Plan:  Hyperlipidemia S: well  controlled on simvastatin last check. No myalgias.  Lab Results  Component Value Date   CHOL 161 06/08/2015   HDL 44.10 06/08/2015   LDLCALC 87 06/08/2015   TRIG 146.0 06/08/2015   CHOLHDL 4 06/08/2015   A/P: update lipids- LDL only with target under 100. Repeat full lipids cpe 6 months  Steroid-induced hyperglycemia S:  controlled. On last check with weight loss On review today she has been on long term steroids and now is on low dose- suspect a1c elevation more due to steroids than true DM- changed this to steroid induced hyperglycemia- discussed if a1c 6.5 or above despite bein gon low dose only would change name back to DM Lab Results  Component Value Date   HGBA1C 6.4  08/28/2016   HGBA1C 6.7 (H) 02/28/2016   A/P: update a1c  Osteoporosis Score actually slightly improved on last check. Saw endocrine. Strongly considered prolia but given stability of prior test has opted to not take prolia at thsi time- agrees to 2 year repeat  Future Appointments Date Time Provider [place]  06/11/2017 10:30 AM Noralee Space, MD [facility] None  11/05/2017 10:00 AM Marin Olp, MD [facility] None  11/18/2017 2:00 PM Renato Shin, MD [facility] None   Return in about 6 months (around 11/06/2017) for physical.  Orders Placed This Encounter  Procedures  . Flu vaccine HIGH DOSE PF  . LDL cholesterol, direct    [place]  . Hemoglobin A1c    [place]  . Hepatic function panel  . Microalbumin / creatinine urine ratio    [place]    Meds ordered this encounter  Medications  . simvastatin (ZOCOR) 40 MG tablet    Sig: Take 1 tablet (40 mg total) by mouth at bedtime.    Dispense:  90 tablet    Refill:  3    Return precautions advised.  Garret Reddish, MD

## 2017-05-08 NOTE — Assessment & Plan Note (Signed)
S: well  controlled on simvastatin last check. No myalgias.  Lab Results  Component Value Date   CHOL 161 06/08/2015   HDL 44.10 06/08/2015   LDLCALC 87 06/08/2015   TRIG 146.0 06/08/2015   CHOLHDL 4 06/08/2015   A/P: update lipids- LDL only with target under 100. Repeat full lipids cpe 6 months

## 2017-05-08 NOTE — Assessment & Plan Note (Signed)
Score actually slightly improved on last check. Saw endocrine. Strongly considered prolia but given stability of prior test has opted to not take prolia at thsi time- agrees to 2 year repeat

## 2017-05-08 NOTE — Assessment & Plan Note (Signed)
S:  controlled. On last check with weight loss On review today she has been on long term steroids and now is on low dose- suspect a1c elevation more due to steroids than true DM- changed this to steroid induced hyperglycemia- discussed if a1c 6.5 or above despite bein gon low dose only would change name back to DM Lab Results  Component Value Date   HGBA1C 6.4 08/28/2016   HGBA1C 6.7 (H) 02/28/2016   A/P: update a1c

## 2017-05-24 ENCOUNTER — Telehealth: Payer: Self-pay

## 2017-05-24 NOTE — Telephone Encounter (Signed)
Left vm requesting patient call be back to schedule nurse visit to get Prolia injection- patient owes $248, no PA needed, and she is due to be scheduled now

## 2017-05-31 NOTE — Telephone Encounter (Signed)
Pt states she spoke to her pcp and they agreed to hold off a little longer. Thank you!  Call pt @ [phone].

## 2017-06-04 ENCOUNTER — Ambulatory Visit (INDEPENDENT_AMBULATORY_CARE_PROVIDER_SITE_OTHER): Payer: Medicare Other | Admitting: Family Medicine

## 2017-06-04 ENCOUNTER — Encounter: Payer: Self-pay | Admitting: Family Medicine

## 2017-06-04 VITALS — BP 158/74 | HR 85 | Temp 98.6°F | Ht 68.0 in | Wt 174.2 lb

## 2017-06-04 DIAGNOSIS — I10 Essential (primary) hypertension: Secondary | ICD-10-CM | POA: Diagnosis not present

## 2017-06-04 DIAGNOSIS — E785 Hyperlipidemia, unspecified: Secondary | ICD-10-CM | POA: Diagnosis not present

## 2017-06-04 HISTORY — DX: Essential (primary) hypertension: I10

## 2017-06-04 LAB — CBC
HCT: 48.5 % — ABNORMAL HIGH (ref 36.0–46.0)
Hemoglobin: 16.1 g/dL — ABNORMAL HIGH (ref 12.0–15.0)
MCHC: 33.2 g/dL (ref 30.0–36.0)
MCV: 94.1 fl (ref 78.0–100.0)
Platelets: 262 10*3/uL (ref 150.0–400.0)
RBC: 5.16 Mil/uL — AB (ref 3.87–5.11)
RDW: 13.4 % (ref 11.5–15.5)
WBC: 10.1 10*3/uL (ref 4.0–10.5)

## 2017-06-04 LAB — BASIC METABOLIC PANEL
BUN: 18 mg/dL (ref 6–23)
CALCIUM: 9.6 mg/dL (ref 8.4–10.5)
CO2: 29 mEq/L (ref 19–32)
Chloride: 103 mEq/L (ref 96–112)
Creatinine, Ser: 0.75 mg/dL (ref 0.40–1.20)
GFR: 79.68 mL/min (ref >60.00)
GLUCOSE: 116 mg/dL — AB (ref 70–99)
Potassium: 4.7 mEq/L (ref 3.5–5.1)
SODIUM: 139 meq/L (ref 135–145)

## 2017-06-04 LAB — TSH: TSH: 1.29 u[IU]/mL (ref 0.35–4.50)

## 2017-06-04 MED ORDER — AMLODIPINE BESYLATE 2.5 MG PO TABS: 2.5000 mg | ORAL_TABLET | Freq: Every day | ORAL | 3 refills | Status: DC

## 2017-06-04 NOTE — Assessment & Plan Note (Signed)
S: New diagnosis of hypertension. 2/3 last readings elevated.  Plus some intermittent elevations in the past. For a few weeks has noted occasional pressure in the head. Started checking blood pressure at home a few days ago ranging from 160s/70s/80s. Feels more anxious than normal. Some tinnitus. Slightly lightheaded.  BP Readings from Last 3 Encounters:  06/04/17 (!) 158/74  05/08/17 118/66  03/16/17 (!) 142/76  A/P: We discussed blood pressure goal of <140/90 or at least <150/90. Start amlodipine 2.5mg  daily. With how she is feeling update bmp, cbc, tsh. Follow up 2 weeks. Cut simvastatin 20mg  in half- may change to atorvastatin 20mg  in future.

## 2017-06-04 NOTE — Patient Instructions (Signed)
blood pressure goal of <140/90 or at least <150/90.   Start amlodipine 2.5mg  daily.   Please stop by lab before you go  Cut simvastatin 20mg  in half- may change to atorvastatin 20mg  in future.

## 2017-06-04 NOTE — Progress Notes (Signed)
Subjective:  Laura [name] is a 76 y.o. year old very pleasant female patient who presents for/with See problem oriented charting ROS- No chest pain. No increased shortness of breath. Mild icreased fatigueNo headache or blurry vision. Doe still have baseline palpitatios . No exertional symptoms.   Past Medical History-  Patient Active Problem List   Diagnosis Date Noted  . Essential hypertension 06/04/2017    Priority: High  . Steroid-induced hyperglycemia 10/18/2016    Priority: High  . Sarcoidosis 11/21/2011    Priority: High  . Hyperlipidemia 10/18/2016    Priority: Medium  . Osteoporosis 12/06/2014    Priority: Medium  . Heart palpitations 07/26/2014    Priority: Medium  . Dyspnea 08/20/2013    Priority: Medium  . GERD with stricture 10/12/2011    Priority: Medium  . Uveitis 09/21/2007    Priority: Medium  . Recurrent UTI 09/09/2007    Priority: Medium  . Anxiety state 08/06/2007    Priority: Medium  . History of colonic polyps 08/06/2007    Priority: Medium  . History of basal cell cancer 10/18/2016    Priority: Low  . Compression fx, lumbar spine ([place]) 09/24/2012    Priority: Low  . Pneumonia 09/24/2012    Priority: Low  . Osteoarthritis 09/21/2007    Priority: Low  . IBS (irritable bowel syndrome) 08/06/2007    Priority: Low  . LOW BACK PAIN, CHRONIC 08/06/2007    Priority: Low    Medications- reviewed and updated Current Outpatient Medications  Medication Sig Dispense Refill  . ALPRAZolam (XANAX) 0.25 MG tablet Take 1/2 to 1 tablet by mouth three times daily 90 tablet 5  . aspirin 81 MG tablet Take 81 mg by mouth daily.      . B Complex Vitamins (VITAMIN-B COMPLEX PO) Take 1 capsule by mouth daily.     . Calcium Carbonate (CALTRATE 600 PO) Take 1 tablet by mouth 2 (two) times daily.      . cholecalciferol (VITAMIN D) 1000 units tablet Take 1,000 Units by mouth daily.    . Esomeprazole Magnesium (NEXIUM 24HR) 20 MG TBEC Take 1 tablet by mouth 2 (two)  times daily.    . Glucosamine HCl (GLUCOSAMINE PO) Liquid form - Take by mouth as directed once daily    . meclizine (ANTIVERT) 25 MG tablet Take 1/2-1 tablet by mouth every 4 hours as needed for dizziness    . Multiple Vitamins-Minerals (MULTIVITAL) tablet Take 1 tablet by mouth daily.      . Omega-3 Fatty Acids (FISH OIL) 1000 MG CAPS Take 1 capsule by mouth daily.      . ondansetron (ZOFRAN) 8 MG tablet 1 tablet by mouth every 6 hours as needed for nausea 25 tablet 5  . predniSONE (DELTASONE) 5 MG tablet Take 1 tablet (5 mg total) by mouth daily with breakfast. 90 tablet 1  . simvastatin (ZOCOR) 40 MG tablet Take 1 tablet (40 mg total) by mouth at bedtime. 90 tablet 3  . sucralfate (CARAFATE) 1 g tablet Take 1 tablet (1 g total) by mouth 2 (two) times daily. 60 tablet 2  . amLODipine (NORVASC) 2.5 MG tablet Take 1 tablet (2.5 mg total) daily by mouth. 90 tablet 3   No current facility-administered medications for this visit.     Objective: BP (!) 158/74 (BP Location: Left Arm, Patient Position: Sitting, Cuff Size: Large)   Pulse 85   Temp 98.6 F (37 C) (Oral)   Ht 5\' 8"  (1.727 m)   Wt  174 lb 3.2 oz (79 kg)   SpO2 97%   BMI 26.49 kg/m  Gen: NAD, resting comfortably, well appearing Mucous membranes are moist. CV: RRR no murmurs rubs or gallops Lungs: CTAB no crackles, wheeze, rhonchi Ext: no edema Skin: warm, dry, no rash  Assessment/Plan:  Essential hypertension S: New diagnosis of hypertension. 2/3 last readings elevated.  Plus some intermittent elevations in the past. For a few weeks has noted occasional pressure in the head. Started checking blood pressure at home a few days ago ranging from 160s/70s/80s. Feels more anxious than normal. Some tinnitus. Slightly lightheaded.  BP Readings from Last 3 Encounters:  06/04/17 (!) 158/74  05/08/17 118/66  03/16/17 (!) 142/76  A/P: We discussed blood pressure goal of <140/90 or at least <150/90. Start amlodipine 2.5mg  daily. With  how she is feeling update bmp, cbc, tsh. Follow up 2 weeks. Cut simvastatin 20mg  in half- may change to atorvastatin 20mg  in future.  if symptoms do not improve with improved BP- will update neuro exam next visit.   Future Appointments  Date Time Provider [place]  06/20/2017 10:30 AM Noralee Space, MD [facility] None  11/05/2017 10:00 AM Marin Olp, MD [facility] None  11/18/2017  2:00 PM Renato Shin, MD [facility] None   2 weeks  Orders Placed This Encounter  Procedures  . Basic metabolic panel    [place]  . TSH    [place]  . CBC    [place]    Meds ordered this encounter  Medications  . amLODipine (NORVASC) 2.5 MG tablet    Sig: Take 1 tablet (2.5 mg total) daily by mouth.    Dispense:  90 tablet    Refill:  3  new diagnosis of hypertension with medication management plus established hyperlipidemia- modifiying medication  Return precautions advised.  Garret Reddish, MD

## 2017-06-06 ENCOUNTER — Other Ambulatory Visit: Payer: Self-pay | Admitting: Pulmonary Disease

## 2017-06-11 ENCOUNTER — Ambulatory Visit: Payer: Medicare Other | Admitting: Pulmonary Disease

## 2017-06-20 ENCOUNTER — Other Ambulatory Visit (INDEPENDENT_AMBULATORY_CARE_PROVIDER_SITE_OTHER): Payer: Medicare Other

## 2017-06-20 ENCOUNTER — Ambulatory Visit (INDEPENDENT_AMBULATORY_CARE_PROVIDER_SITE_OTHER)
Admission: RE | Admit: 2017-06-20 | Discharge: 2017-06-20 | Disposition: A | Discharge status: Home / Self Care | Payer: Medicare Other | Source: Ambulatory Visit | Attending: Pulmonary Disease | Admitting: Pulmonary Disease

## 2017-06-20 ENCOUNTER — Ambulatory Visit: Payer: Medicare Other | Admitting: Family Medicine

## 2017-06-20 ENCOUNTER — Ambulatory Visit: Payer: Medicare Other | Admitting: Pulmonary Disease

## 2017-06-20 ENCOUNTER — Encounter: Payer: Self-pay | Admitting: Pulmonary Disease

## 2017-06-20 VITALS — BP 126/64 | HR 90 | Ht 68.0 in | Wt 174.4 lb

## 2017-06-20 DIAGNOSIS — M150 Primary generalized (osteo)arthritis: Secondary | ICD-10-CM | POA: Diagnosis not present

## 2017-06-20 DIAGNOSIS — R918 Other nonspecific abnormal finding of lung field: Secondary | ICD-10-CM | POA: Diagnosis not present

## 2017-06-20 DIAGNOSIS — K222 Esophageal obstruction: Secondary | ICD-10-CM

## 2017-06-20 DIAGNOSIS — M159 Polyosteoarthritis, unspecified: Secondary | ICD-10-CM

## 2017-06-20 DIAGNOSIS — I10 Essential (primary) hypertension: Secondary | ICD-10-CM

## 2017-06-20 DIAGNOSIS — D869 Sarcoidosis, unspecified: Secondary | ICD-10-CM

## 2017-06-20 DIAGNOSIS — M545 Low back pain, unspecified: Secondary | ICD-10-CM

## 2017-06-20 DIAGNOSIS — M8000XD Age-related osteoporosis with current pathological fracture, unspecified site, subsequent encounter for fracture with routine healing: Secondary | ICD-10-CM | POA: Diagnosis not present

## 2017-06-20 DIAGNOSIS — K219 Gastro-esophageal reflux disease without esophagitis: Secondary | ICD-10-CM

## 2017-06-20 DIAGNOSIS — J069 Acute upper respiratory infection, unspecified: Secondary | ICD-10-CM

## 2017-06-20 DIAGNOSIS — S32010D Wedge compression fracture of first lumbar vertebra, subsequent encounter for fracture with routine healing: Secondary | ICD-10-CM

## 2017-06-20 LAB — SEDIMENTATION RATE: SED RATE: 8 mm/h (ref 0–30)

## 2017-06-20 LAB — C-REACTIVE PROTEIN: CRP: 0.3 mg/dL — AB (ref 0.5–20.0)

## 2017-06-20 MED ORDER — AZITHROMYCIN 250 MG PO TABS: ORAL_TABLET | ORAL | 0 refills | Status: DC

## 2017-06-20 MED ORDER — METHYLPREDNISOLONE ACETATE 80 MG/ML IJ SUSP
80.0000 mg | Freq: Once | INTRAMUSCULAR | Status: AC
  Administered 2017-06-20: 80 mg via INTRAMUSCULAR

## 2017-06-20 NOTE — Patient Instructions (Signed)
Today we updated your med list in our EPIC system...    Continue your current medications the same...  Today we gave you a Depomedrol shot & a ZPak (take as directed) for the upper resp infection...  We have checked a follow up CXR & some additional blood work today...    We will contact you w/ the results when available...   Rest at home, drink plenty of fluids, take Tylenol as needed....  Call for any questions or if we can be of service in any way...  Let's plan a follow up visit in 3-4 months, sooner if needed for problems.Marland KitchenMarland Kitchen

## 2017-06-21 ENCOUNTER — Ambulatory Visit (INDEPENDENT_AMBULATORY_CARE_PROVIDER_SITE_OTHER): Payer: Medicare Other | Admitting: Family Medicine

## 2017-06-21 ENCOUNTER — Encounter: Payer: Self-pay | Admitting: Family Medicine

## 2017-06-21 ENCOUNTER — Encounter: Payer: Self-pay | Admitting: Pulmonary Disease

## 2017-06-21 VITALS — BP 140/72 | HR 88 | Temp 98.3°F | Ht 68.0 in | Wt 175.4 lb

## 2017-06-21 DIAGNOSIS — E785 Hyperlipidemia, unspecified: Secondary | ICD-10-CM

## 2017-06-21 DIAGNOSIS — I10 Essential (primary) hypertension: Secondary | ICD-10-CM

## 2017-06-21 LAB — ANGIOTENSIN CONVERTING ENZYME: Angiotensin-Converting Enzyme: 42 U/L (ref 9–67)

## 2017-06-21 MED ORDER — AMLODIPINE BESYLATE 5 MG PO TABS: 5.0000 mg | ORAL_TABLET | Freq: Every day | ORAL | 5 refills | Status: DC

## 2017-06-21 NOTE — Patient Instructions (Addendum)
Increase amlodipine to 5mg   Follow up before Christmas  Continue simvastatin at 20mg   Hope you feel better with the azithromycin

## 2017-06-21 NOTE — Assessment & Plan Note (Signed)
S: controlled mildly poorly on amlodipine 2.5mg  .  Home #s ranging anywhere 120s to 150s.  BP Readings from Last 3 Encounters:  06/21/17 140/72  06/20/17 126/64  06/04/17 (!) 158/74  A/P: We discussed blood pressure goal of <140/90. Continue current meds:  But increse dose to amlodipine 5mg  with follow up before Christmas. Laura [name] states fatigue resolved from last visit. Laura [name] apparently did have some headaches and those also resolved

## 2017-06-21 NOTE — Progress Notes (Signed)
Subjective:    Patient ID: Laura [name], female    DOB: Mar 07, 1941, 76 y.o.   MRN: [id number]  HPI 76 y/o WF here for a follow up visit... she has prob Sarcoidosis w/ hx Uveitis and BHA on CXR => Pred therapy started 12/2015 due to progressive CXT/ CT abnormality...   ~  SEE PREV EPIC NOTES FOR OLDER DATA >>   LABS 5/13:  FLP- at goals on Simva40;  Chems- wnl;  CBC- wnl;  TSH=1.61;  VitD=47  ADDENDUM>> she received TDAP w/ local reaction req antihist & topical Rx...  CXR 11/13 showed similar changes- irreg nodular changes in right suprahilar region, scarring, stable adenopathy, mild atherosclerotic calcif, osteopenia...  ~  Laura [name] reports that in Feb2014 she fell while getting up at night & hit her back w/ severe pain & L1 compression fx- [facility]/ [facility]ph[facility] & told to have LLLpneumonia treated w/ Levaquin.   CXR 3/14 showed normal heart size, atherosclerotic Ao, chr scarring from underlying sarcoid, NAD...  LABS 5/14:  FLP- at goals on Simva40;  Chems- wnl;  CBC- wnl;  TSH=1.74;  VitD=52... ~   Seen by DrTaylor for Cards 3/15> 2DEcho showed norm LVF, norm valves, no signs of pulmHTN; subseq stress testing showed extreme dyspnea w/ exercise (poor functional capacity), no ischemia; she has been encouraged to join silver sneakers and incr her exercise program...   LABS 1/15:  Chems- ok x K=3.4;  CBC- wnl;  TSH=2.28;  Mag=2.0.Marland KitchenMarland Kitchen  CXR 1/15 showed norm heart size, areas of scarring appear unchanged, prom hilae w/o change- stable, NAD.Marland KitchenMarland Kitchen   EKG 3/15 showed NSR, rate79, wnl, NAD...  Stress Test 3/15 showed poor exercise capacity, no ischemia...  2DEcho 3/15 showed normal LVF w/ EF=55-60%, no regional wall motion abn, normal valves, no evid for pulmHTN...  PFTs 5/15 showed just some sm airways dis> FVC=2.65 (78%), FEV1=1.86 (72%); %1sec=70; mid-flows=55% predicted... After bronchodil her FEV1 improved 6%... Lung Volumes & DLCO are wnl...   LABS 5/15:  ACE  level = 55     LABS 11/15:  FLP- looks good on Simva40, needs to lose wt;  Chems- wnl;  CBC- wnl;  TSH=2.19;  VitD=52...   ~  Dec 06, 2014:  [place]le[place] elbow surg for a nodule & excision by DMelrose Nakayamashowed path=numerous nonnecrotizing granulomas, spec stains neg & this is c/w an unusual manifestation of her Sarcoid; she was told "scar tissue" and is improved now... We reviewed the following medical problems during today's office visit >>     Sarcoid> Hx abn CXR, she is asymptomatic; baseline CXR w/ right suprahilar nodule, adenop, scarring & chr changes; ?[place]neum [place]; last CXR 1/15 was stable and last ACE=55; she had nodule excised from left elbow 4/16 by DrKuzma=> granulomatous nodule c/w unusual manifewstation of Sarcoid, all spec stains were neg...    HxMVP, palpit w/ PVCs & PACs on Holter> on ASA81;  She denies CP, palpit, SOB now; advised no caffeine & avoid stress!    Chol> on Simva40 & FishOil; last FLP 11/15 showed TChol 169, TG 135, HDL 41, LDL 101; reviewed diet & wt reduction...    GI> GERD, IBS, Polyp> on Prilosec40Bid, Carafate1GmQhs, & Zofran prn; followed by DrJacobs- she remains asymptomatic w/o abd pain, n/v, c/d, blood seen...    UTIs> she reports no prob on Cranberry juice but had one recurrent UTI 4/16- Cipro cqalled in & symptoms resolved..Marland KitchenMarland KitchenMarland Kitchen  DJD, LBP, L1 compression w/ kyphoplasty, Osteop> on calcium, MVI, VitD & Tramadol50; she fell 2/14 w/ L1 compression & kyphoplasty in [place]; BMD 2/16 w/ Tscore-1.8 & we rec ALENDRONATE70/wk...    Anxiety> she has a lot of family support, doesn't require meds... We reviewed prob list, meds, xrays and labs> see below for updates >>   CXR 5/16 showed stable BHA & scarring on right, NAD, no change...  LABS 5/16:  ACE=56, stable...  ~  June 08, 2015:  45moRFlat Rockhad f/u DrPyrtle recently- GERD, esoph spasm, IBS, colon polyps & +FamHx colon ca in sister> Rx w/ Nexium20Bid, Carafate  prn, Cardizem30 prn esoph spasm and she is improved ;  She also had f/u Cards DrTaylor 12/23/14- palpit w/ PVCs and PACs, improved and rec to f/u prn... Note that she is off prev Fosamax Rx- took it for 312mo stopped due to reflux- offered Binoso but she prefers off Rx (BMD 2/16- Tscore -1.8) & she takes Ca & VitD supplement... Prob list as above> EXAM shows Afeb, VSS, O2sat=95% on RA;  HEENT- neg, Mallampati1;  Chest- clear w/o w/r/r;  Heart- RR w/o m/r/g;  Abd- soft, nontender, neg;  Ext- neg w/o c/c/e;  Neuro- intact w/o focal abn...  LABS 06/07/16>  FLP- all parameters at goals on Simva40;  Chems- wnl;  CBC- wnl;  TSH=1.86;  VitD=52;  ACE=63   IMP/PLAN>>  [name] stable on current regimen- continue same meds and f/u in 70m9mo  ~  December 26, 2015:  6-29mo529mo [place]ains asymptomatic, feeling well, and denies CP/ palpit. Cough/ phlegm/ hemoptysis, SOB, edema, etc;  She tells me that she got the Flu at the end of March noting HA, sore throat, severe cough w/o much sput, low grade temp, aching & sore, etc & was seen in the ER where CXR showed bilat hilar adenopathy & increased curvilinear thickening on the right but no acute process;  Flu panel was POS for TypeA flu;  She had f/u OV w/ TP shortly thereafter & given ZPak, Tessalon, Mucinex, Delsym, fluids;  She notes that it took her 8wks to fully resolve & get her energy back!  We reviewed the following medical problems during today's office visit >>     Ophthalmology> Hx uveitis, s/p bilat cat surg, [facility]   Sarcoid> Hx uveitis & abn CXR w/ BHA, she is asymptomatic; baseline CXR w/ adenop, scarring & chr changes; hx ?LL[place]pneum 2[place]146/76[place] w/ Lev[place]; last CXR 5/16 was stable and last ACE=63; she had nodule excised from left elbow 4/16 by DrKuzma=> granulomatous nodule c/w unusual manifestation of Sarcoid, all spec stains were neg...    HxMVP, palpit w/ PVCs & PACs on Holter> on ASA81;  She denies CP, palpit, SOB now; advised no  caffeine & avoid stress!    Chol> on Simva40 & FishOil; last FLP 11/15 showed TChol 169, TG 135, HDL 41, LDL 101; reviewed diet & wt reduction...    GI> GERD, IBS, Polyp> on Prilosec40Bid, Carafate1GmQhs, & Zofran prn; followed by DrPyrtle- she remains asymptomatic w/o abd pain, n/v, c/d, blood seen...       ADDENDUM> she had colonoscopy 01/17/16> + for divertics, hemorrhoids, and several polyps- 4 to 70mm 35me & Path= sessile serrated adenomas & f/u suggested 3 yrs.    UTIs> she reports no prob on Cranberry juice but had one recurrent UTI 4/16- Cipro cqalled in & symptoms resolved....    DJD, LBP, L1 compression w/  kyphoplasty, Osteop> on calcium, MVI, VitD & Tramadol50; she fell 2/14 w/ L1 compression & kyphoplasty in [place]; BMD 2/16 w/ Tscore-1.8 & we rec ALENDRONATE70/wk...    Anxiety> she has a lot of family support, doesn't require meds... EXAM shows Afeb, VSS, O2sat=97% on RA;  HEENT- neg, Mallampati1;  Chest- clear w/o w/r/r;  Heart- RR w/o m/r/g;  Abd- soft, nontender, neg;  Ext- neg w/o c/c/e;  Neuro- intact w/o focal abn...  CXR 10/16/15> I have reviewed this film & compared to prev images>  bilat hilar adenopathy & increased opac in left superior hilum and right lat hilar region w/ extension into ant RUL (similar to 11/2014 films but progressed from previous CXRs) => we will proceed w/ f/u CT Chest...  LABS 10/16/15>  Flu panel showed POS FluA;  Neg FluB and H1N1...   LABS 12/2015>  ACE=62 (nl=8-52);  VitD=51;  BMet- ok x BS=131, Cr=0.74, Ca=9.4.Marland KitchenMarland Kitchen  CT Chest w/ contrast 01/06/16>  Norm heart size, aortic & coronary atherosclerosis, part calcif mediastinal adenopathy (unchanged from 2009), progression of bilat hilar adenopathy & pulm parenchymal changes w/ mass-like density in RML~4cm, and mass-like density in ant LUL ~4x2cm -- radiology feels this is c/w sarcoid progression.   Full PFTs 01/06/16>  FVC=2.66 (80%), FEV1=1.82 (72%), %1sec=68, mid-flows reduced at 53% predicted; post bronchodil  FEV1 improved 5% to 1.92;  TLC=5.57 (98%), RV=2.72 (108%), RV/TLC=49%;  DLCO=70% pred (DL/VA=96%). IMP/PLAN>>  Concern for active sarcoidosis w/ the recent CXR and CT changes; she has never had a biopsy due to remote presentation w/ BHA and uveitis and the fact that she has been devoid of resp symptoms, and prev had neg ACE levels (ie- no signs of dis activity);  Her recent CXR changes (progression), sl elev ACE in the low 60s, suggests some activity yet she remains stoic & asymptomatic;  She would like to proceed w/o lung Bx if poss & I discussed trial PREDNISONE w/ short term f/u to check ACE & CXR (if not improved then we will address need for lung bx at that time);  REC to start Pred40/d x2wks, 30/d x2wks, then 20/d til return in about 6wks time...   ~  February 22, 2016:  46moROV & f/u sarcoid> In Jun2017 we did CTChest showing norm heart size, aortic & coronary atherosclerosis, part calcif mediastinal adenopathy (unchanged from 2009), progression of bilat hilar adenopathy & pulm parenchymal changes w/ mass-like density in RML~4cm, and mass-like density in ant LUL ~4x2cm -- radiology feels this is c/w sarcoid progression;  ACE level= 62;  We discussed poss Bx but elected to start Pred therapy w/ 40-30-20 Q2wks and she returns today on Pred20/d noting various symptoms related to the Pred she feels eg- Ha, sweating, trembling, incr HR, & short tempered; these symptoms have diminished w/ decr in the dose; she also notes SOB, can't get a DB, & denies CP- but she has stepped up her walking program & doing better now;  We discussed checking ACE level & weaning Pred further based on this result...    EXAM shows Afeb, VSS, O2sat=97% on RA;  HEENT- neg, Mallampati1;  Chest- clear w/o w/r/r;  Heart- RR w/o m/r/g;  Abd- soft, nontender, neg;  Ext- neg w/o c/c/e;  Neuro- intact w/o focal abn...  LABS 02/2016>  BMet- ok w/ BS=100, A1c= 6.7, Cr=0.81, K=3.9;  ACE=23... IMP/PLAN>>  Laura [name] somewhat improved on the Pred &  ACE is down to 23;  I suggest that she keep the Pred20/d for another 2wks  then cut it to 54m alt w/ 153mQod til return in 6 wks time...  ~  April 04, 2016:  6wk ROV & f/u sarcoid>  She reports stable on the Pred 20-10 Qod for the past month & doing better on the lower doses, BS better & wt stable at 170#- we reviewed low carb, low sodium diet;  She denies CP, SOB, cough, sput, hemoptysis, etc...    EXAM shows Afeb, VSS, O2sat=97% on RA;  HEENT- neg, Mallampati1;  Chest- clear w/o w/r/r;  Heart- RR w/o m/r/g;  Abd- soft, nontender, neg;  Ext- neg w/o c/c/e;  Neuro- intact w/o focal abn...  CXR 04/04/16>  Stable heart size, mild atherosclerosis of Ao, BHA & perihilar opacities sl decreased from prev films, no new airspace dis... IMP/PLAN>>  We decided to continue the Pred 2028mlt w/ 42m31md thru Oct & then decr to 42mg36mtarting in Nov until her f/u visit in 48mo w70mou CXR & Labs...  ~  June 26, 2016:  48mo RO148moLois [name] on Pred 42mg/d 43mthe last month- she is still noting mult somatic complaints and blames the Pred=> we discussed change to MEDROL 8mg tabs58m see if she tolerates this better; she tells me she's been exercising 3d/wk at the gym & doing satis, no cough/ sputum, and dyspnea improving; she notes occas palpit "fluttering" and more aware of heart beat- I have tried to get her to take the Alpraz0.25mg tabs38m1/2 to1 tab Tid more regularly; we will recheck labs today, & she will need f/u CT Chest in early 2018 (14yr f/u sc7yr..    EXAM shows Afeb, VSS, O2sat=97% on RA;  HEENT- neg, Mallampati1;  Chest- clear w/o w/r/r;  Heart- RR w/o m/r/g;  Abd- soft, nontender, neg;  Ext- neg w/o c/c/e;  Neuro- intact w/o focal abn...  LABS 06/26/16> Chems- ok x BS=117;  ACE=28 IMP/PLAN>>  Laura [name] perceiAniayasome difficulty w/ the Pred but she is improved overall & ACE down to 28; we decided to change to MEDROL 8mg tabs ta80mgone Qam for Dec & cut to 8mg alternat18m w/ 4mg QOD start32m Jan2018=> we  will recheck her in Feb w/ f/u CTChest due and also due for 63yr recheck BM18yr Note: >50% of this 15min visit was37mnt in counseling and coordination of care.  ~  August 28, 2016:  66mo ROV & [place] i55moproved- no new complaints or concerns, she has maintained on Pred 10-5 Qod for her sarcoidosis;  She did not tolerate MEDROL saying that "it messed up my tummy" w/ cramps and diarrhea which resolved after switch to Pred...she denies cough, sput, SOB, CP, etc- no f/c/s... She is due for f/u CXR/ Labs today...    EXAM shows Afeb, VSS, O2sat=97% on RA;  HEENT- neg, Mallampati1;  Chest- clear w/o w/r/r;  Heart- RR w/o m/r/g;  Abd- soft, nontender, neg;  Ext- neg w/o c/c/e;  Neuro- intact w/o focal abn...  CXR 08/28/16 (independently reviewed by me in the PACS system) showed norm heart size, aortic atherosclerosis, similar BHA & incr markings unchanged from prev (by my review the markings are better than 09/2015 & similar to 9/17).   LABS 08/28/16>  Chems- ok x K=3.4, BS is wnl at 94, LFTs wnl, A1c=6.4,  ACE level = 24 => ok to cut Pred to 5mg po Qd... IMP/44mN>>  We will decr her Pred to 5mg daily, asked t77maintain her diet & exercise program, ROV in 48mo & consider CT666mo  f/u...  ~  Nov 27, 2016:  24moROV & f/u Sarcoidosis> Laura [name] that her breathing is good & she has no new complaints or concerns, she is exercising in a gym for 1H 3d/wk, doing zoomba etc; current Pred dose is '10mg'$  alt w/ '5mg'$  Qod and she confirms- no cough, sput, or hemoptysis; her SOB/DOE is stable w/ no difficulty w/ ADLs but notes trouble w/ inclines and stairs; she is also under stress from mult fronts-- Jim's parkinsons & some mental changes, sisters LBarbaraann Share& RBertram Millardmoving to [place](LTaiyais tearful & has a support group)... NOTE: she reminds me that her brother had Sarcoid in his 431s very servere, almost died, [place]..    She had f/u w/ PCP-DrHunter 10/18/16> establish care, hx Sarcoid, HL(on Simva40), DM  (A1c=6.4 on diet), GERD (on Nexium Bid), osteoporosis (referred to endocrine)...    She saw DrEllison 11/19/16 for osteoporosis> [facility] for several mo in 2016 & stopped due to arthralgias, BMD 08/2016 showed lowest Tscore= -1.7 in RFN w/ incr risk;  REC to prevent falls, Ca '1200mg'$ /d, VitD 400u/d, & start Prolia 2x/yr (SPE- neg, TSH-0.79, VitD-69, PTH=wnl at 25)...    EXAM shows Afeb, VSS, O2sat=100% on RA;  HEENT- neg, Mallampati1;  Chest- clear w/o w/r/r;  Heart- RR w/o m/r/g;  Abd- soft, nontender, neg;  Ext- neg w/o c/c/e;  Neuro- intact w/o focal abn... IMP/PLAN>>  We discussed decreasing her Pred from 10-5 Qod to '5mg'$ /d w/ routine rov in 367moI also encouraged her use of the Prolia for her osteopenia + hx compression fx & need for Pred rx...  ~  March 07, 2017:  72m57moV & last visit LoiCarys doing well- exercising at gym & w/ zoomba, asymptomatic from the Sarcoid standpoint on Pred 10 alt w/ 5 qod- so we decided to decr to '5mg'$  daily... She reports stable on the Pred '5mg'$ /d over the last 72mo22mootes fewer side effects like sweating, irritability, etc; her breathing is good- no cough/ sputum/ hemoptysis; DOE is similar noted esp w/ inclines & stairs, no worse, no CP etc...     We reviewed the following medical problems during today's office visit >>     Ophthalmology> Hx uveitis, s/p bilat cat surg, [facility]   Sarcoid> Hx uveitis & abn CXR w/ BHA, she is asymptomatic; baseline CXR w/ adenop, scarring & chr changes; hx ?[place]neum [place]144/[place]/ Lev[place]; f/uCXR 5/16 was stable and ACE=63; she had nodule excised from left elbow 4/16 by DrKuzma=> granulomatous nodule c/w unusual manifestation of Sarcoid, all spec stains were neg... CT Chest 6/17 showed progression of bilat hilar adenopathy & pulm parenchymal changes w/ mass-like density in RML~4cm, and mass-like density in ant LUL ~4x2cm -- radiology feels this is c/w  sarcoid progression & ACE up to 63 => she declined bx & we started on Pred Rx=> CXR & ACE improved as we wean the Pred... NOTE: she reminds me that her brother had Sarcoid in his 40s,53sry servere, almost died, [place] palpit w/ PVCs & PACs on Holter> on ASA81;  She denies CP, palpit, SOB now; advised no caffeine & avoid stress!    Chol> on Simva40 & FishOil; last FLP 11/16 showed TChol 161, TG 146, HDL 44, LDL 87; reviewed diet & wt reduction...    GI> GERD, IBS, Polyp> on  Nexium200Bid, Carafate1GmQhs, & Zofran prn; followed by DrPyrtle- she remains asymptomatic w/o abd pain, n/v, c/d, blood seen...       ADDENDUM> she had colonoscopy 01/17/16> + for divertics, hemorrhoids, and several polyps- 4 to 17m size & Path= sessile serrated adenomas & f/u suggested 3 yrs.    UTIs> she reports no prob on Cranberry juice but had one recurrent UTI 4/16- Cipro cqalled in & symptoms resolved....    DJD, LBP, L1 compression w/ kyphoplasty, Osteop> on calcium, MVI, VitD & Tramadol50; she fell 2/14 w/ L1 compression [place] w/ Tscore-1.8 & we rec ALENDRONATE70/wk=> she stopped & Endocrine consult DrEllisonn rec Prolia Q652mo.    Anxiety> she has a lot of family issues, on Xanax 0.25 prn... EXAM shows Afeb, VSS, O2sat=97% on RA;  HEENT- neg, Mallampati1;  Chest- clear w/o w/r/r;  Heart- RR w/o m/r/g;  Abd- soft, nontender, neg;  Ext- neg w/o c/c/e;  Neuro- intact w/o focal abn...  LABS 03/07/17>  ACE level = 53 (up from 24-28 range, but still wnl w/ norm range=9/67);  BMet- ok x BS=122;  CBC- OK w/ Hg=16.2, WBC=9.1 IMP/PLAN>>  With the incr in ACE to 53- we decided to continue the Pred '5mg'$  daily; she is asked to maintain her exercise program etc; she will call prn any resp problems and we plan ROV recheck in 3-68m868mo      NOTE:  >50% of this 37m2mOV was spent in counseling & coordination of care...    ~  June 20, 2017:  3-68mo 17mo&Emerald Beachremained stable on the Pred  '5mg'$ /d (for the last 61mo- 58mo75mo ag70momped up from 24=>53);  She reports an upper resp infection over the last wk w/ sore throat, sinus congestion, HA, clear drainage, watery eyes, & "not feeling well" she says, and "my eyes are weak"; her temp was 98 which she notes is one degree of fever for her, denies chills/ sweats; she notes dry cough but denies chest congestion/ tightness/ wheezing...    Sarcoid> Hx uveitis & abn CXR w/ BHA, she is asymptomatic; baseline CXR w/ adenop, scarring & chr changes; hx ?[place]neum 2[place]/ Lev[place]; f/uCXR 5/16 was stable and ACE=63; she had nodule excised from left elbow 4/16 by DrKuzma=> granulomatous nodule c/w unusual manifestation of Sarcoid, all spec stains were neg... CT Chest 6/17 showed progression of bilat hilar adenopathy & pulm parenchymal changes w/ mass-like density in RML~4cm, and mass-like density in ant LUL ~4x2cm -- radiology feels this is c/w sarcoid progression & ACE up to 63 => she declined bx & we started on Pred Rx=> CXR & ACE improved as we wean the Pred... NOTE: she reminds me that her brother had Sarcoid in his 40s, ve31sservere, almost died, [place], O2sat=97% on RA;  HEENT- neg, Mallampati1;  Chest- clear w/o w/r/r;  Heart- RR w/o m/r/g;  Abd- soft, nontender, neg;  Ext- neg w/o c/c/e;  Neuro- intact w/o focal abn; no palp adenopathy...  CXR 06/20/17 (independently reviewed by me in the PACS system) showed chr hilar enlargement & architectural distortion stable since 2017, incr interstitial markings are similarly stable, scoliosis w/ upper lumbar augmnentation...  LABS 06/04/17 by DrHunter>  Chems- wnl x BS=116;  CBC- wnl w/ Hg=16.1;  TSH=1.29...  LABS 06/20/17>  ACE=42,  Sed=8,  CRP=0.3 IMP/PLAN>>  Laura [name] haJone upper resp infection on top of her chronic sarcoidosis- on Pred '5mg'$ /d  for the last 79moor so & her CXR is stable and inflamm markers are neg + ACE=42;  We discussed OTC rx w/ Antihist &  Flonase + Rx w/ ZPak;  We decided to keep the Pred '5mg'$ /d for her sarcoidosis in light of her CXR changes (likely scarring now) and ACE 42...           Problem List:  Hx of UVEITIS (ICD-364.3) [facility] given [facility] in 2002... s/p right cataract surg 2/09 and no active uveitis seen... ~  4/11:  she tells me she was seen 10/10 & doing well, may need left cataract surg soon. ~  [facility]  DrDickinson noted hx uveitis- steroid responder, s/p bilat cat surg w/ lens replacement, epiretinal membrane & post vitreous detachments...   R/O SARCOIDOSIS (ICD-135) - [facility] and subseq f/u CXR w/ CTChest showing bilat hilar adenopathy, and w/u showing neg PPD, ACE=53, sed=13... prev PFT's 1/08 showed FVC 3.75 (111%), FEV1 2.74 (102%), %1sec=73, mid-flows=70%... being followed w/ serial films and "watchful waiting"... there is a family hx of sarcoidosis in one brother (severe dis w/ neuropathy). ~   we had a f/u CXR in Feb09- streaky opacity right mid lung zone, similar to 2008...  ~  CT Chest 7/08 & 6/09 showed mediastinal > hilar adenopathy some w/ calcif (generally smaller than 1/08), area of atx/ scarring right mid zone (infer RUL) & scat <1cm nodules in lower zones w/o change... ~  CXR 2/10 showed bilat hilar prominence & scarring appears the same- NAD... labs all WNL w/ ACE=56. ~  CXR 4/11 showed mild bilat hilar prominence, scarring, NAD- osteopenia & DJD in TSpine... ACE= 37. ~  CXR 4/12 showed stable bilat hilar prominence, scarring, NAD..[name] ~  CXR 11/13 showed similar changes- irreg nodular changes in right suprahilar region, scarring, stable adenopathy, mild atherosclerotic calcif, osteopenia... ~  CXR 3/14 showed normal heart size, atherosclerotic Ao, chr scarring from underlying sarcoid, NAD..[name] ~  CXR 1/15 showed norm heart size, areas of scarring appear unchanged, prom hilae w/o change- stable, NAD ~  PFTs 5/15 showed  just some sm airways dis> FVC=2.65 (78%), FEV1=1.86 (72%); %1sec=70; mid-flows=55% predicted... After bronchodil her FEV1 improved 6%... Lung Volumes & DLCO are wnl. ~  LABS 5/15:  ACE level = 55  ~  4/16: she had nodule excised from left elbow 4/16 by DrKuzma=> granulomatous nodule c/w unusual manifewstation of Sarcoid, all spec stains were neg... ~  5/16: CXR 5/16 showed stable BHA & scarring on right, NAD, no change... LABS 5/16:  ACE=56, stable. ~  2017>  +FluA illness 09/2015 w/ ER eval & CXR showing progressive abn;  CT Chest 12/2015 confirmed progression of BHA & LUL/RML parenchymal densities felt to be c/w active Sarcoid, ACE=62;  Pt wanted to avoid Bx if poss & we decided on a course of Pred 40-30-20 Q2wk taper w/ ROV in 6wks for f/u=> ACE improved to 23 and we weaned Pred slowly... ~  12/17: Pt perceives several issues w2/ the Pred (now down to '10mg'$ /d) so we will switch to Medrol'8mg'$  tabs and continue slow weaning..[name] PALPITATIONS >>  Hx MITRAL VALVE PROLAPSE >> on ASA '81mg'$ /d... clinical dx w/ 2DEcho 1999 showing flat closure but no frank prolapse... ~  2/16: she had a cardiac eval by DrTaylor for dyspnea> 2DEcho was neg, wnl; stress testing was neg- w/o ischemia; noted to have poor functional capacity & is way too sedentary;  she had some palpit & monitor revealed PVCs & PACs- didn't want meds therefore asked to avoid caffeine 7 stress...  HYPERCHOLESTEROLEMIA (ICD-272.0) - on SIMVASTATIN '40mg'$ /d now... ~  [place] 1/08 on Lip10 showed TChol 183, TG 122, HDL 45, LDL 114...  ~  [place] 2/09 on Lip10 showed TChol 183, TG 189, HDL 39, LDL 107... continue diet & change to Simva40.  ~  FLP 5/09 on Simva40 showed TChol 185, TG 163, HDL 36, LDL 117... she wants to stay on Simva40. ~  FLP 2/10 showed TChol 176, Tg 160, HDL 39, LDL 105... rec> continue same. ~  FLP 4/11 showed TChol 160, TG 131, HDL 41, LDL 93 ~  FLP 4/12 showed TChol 174, TG 148, HDL 43, LDL 102 ~  FLP 5/13 showed TChol 159, TG 151, HDL 47,  LDL 82 ~  FLP 5/14 on Simva40 showed TChol 165, TG 157, HDL 41, LDL 93 ~  FLP 11/15 on Simva40 showed TChol 169, TG 135, HDL 41, LDL 101 ~  FLP 11/16 on Simva40 showed TChol 161, TG 146, HDL 44, LDL 87  GERD (ICD-530.81) - on NEXIUM '40mg'$ /d & ZANTAC '300mg'$ Qhs... ~  EGD 6/07 w/ 3cmHH, stricture, gastitis (HPylori neg)... I offered to change her Nexium to a generic but she declined- "DrPatterson told me never to change this med"... she notes occas nocturnal reflux symptoms. ~  5/12: She had GI eval by DrPatterson 5/12> chr GERD, hx 3cmHH, prev stricture dilated, hx colon polyps, etc> c/o incr reflux symptoms, bloating, pressure despite her Nexium40AM & Zantac300PM;  Nexium was increased to Bid, Carafate added Qid & she had EGD 5/12 showing mod gastritis w/ bx= chr inflamm, neg HPylori... ~  CTAbd 5/12 showed mult parenchymal nodules at lung bases (some fractionally larger), calcif hilar nodes bilat, no renal lesions (?left upper pole mass seen on sonar?). ~  EGD 3/13 by DrPatterson showed 3cmHH, esophagitis, prob "pill espohagitis", dilated... Symptoms resolved w/ Carafate, Nexium, Zantac, Xylocaine... ~  5/14: on Nexium40Bid, Zantac300HS, CarafateQid prn, & Zofran prn; she remains asymptomatic w/o abd pain, n/v, c/d, blood seen... ~  11/15: on Nexium40, Carafate1GmQhs, & Zofran prn; she has seen DrPyrtle & remains asymptomatic on these meds- w/o abd pain, n/v, c/d, blood seen...  IRRITABLE BOWEL SYNDROME (ICD-564.1) COLONIC POLYPS, HX OF (ICD-V12.72)  ~  Colonoscopy 6/07 by DrPatterson showing divertics & polyps (adenomatous)... f/u planned 79yr... ~  She had f/u colonoscopy 5/12 showing mod divertics, sessile polyp in cecum= serrated adenoma w/ f/u planned 3-562yr..  UTI'S, CHRONIC (ICD-599.0) - eval by DrOttelin for urology... now off the Macrodantin and using cranberry juice without recurrent UTI, no problems...  DEGENERATIVE JOINT DISEASE (ICD-715.90) - eval by DrDalldorf w/ mod DJD in knees &  hx of torn left meniscus... 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 overall improved now. ~  11/13:  TRAMADOL '50mg'$  Tid refilled for prn use... ~  2015-16[facility] right & left knee DJD, right ankle tendonitis, right plantar fasciitis...  ~  4/16: she had right elbow surg by DrKuzma> mult granulomas on path c/w sarcoid nodule...  LOW BACK PAIN, CHRONIC (ICD-724.2) ~  2/14: she fell while getting up at night & hit her back w/ severe pain & L1 compression fx- [facility] & good pain relief...  OSTEOPENIA (ICD-733.90) - on Calcium, MVI, Vit D... ~  BMD  in 2005 showed TScore -1.2 in Spine, & -0.7 in [facility]. ~  BMD 5/09 showed TScores -1.5 in Spine & -1.1 in [facility]. ~  BMD here 5/11 showed TScores -1.2 in Spine, and -1.2 in [facility]. ~  5/13 & 5/14:  She is due for f/u BMD but wants to wait for now... ~  Labs 11/15 showed VitD= 52... ~  BMD 2/16 revealed Tscore -1.8 in Lspine and Rt FemNeck; we rec FOSAMAX70/wk in light of her prev compression fx=> pt stopped the Bisphos after several months due to reflux symptms and refuses alternative med... ~  She is rec to take Calcium, MVI, VitD supplement, & wt bearing exercise...  ANXIETY (ICD-300.00) >> given ALPRAZOLAM 0.'25mg'$  tabs w/ directions to try 1/2 to 1 tab Tid more regularly...  Hx of BASAL CELL SKIN CANCER - she still sees her Derm in [place] yearly...  Health Maintenance: ~  GI:  followed by DrPatterson/Pyrtle w/ colon as above... ~  GYN:  followed by DrRichardson & doing well by pt's report... Mammograms at the [facility]... ~  Immunizations:  she gets yearly flu shots;  has PNEUMOVAX in 1998 & repeated 10/11 (age 84);  TDAP given 5/13;  we discussed Shingles vaccine (she had bout of shingles involving her right hip & leg ~age30) & she will decide.    Past Surgical History:    Procedure Laterality Date  . ABDOMINAL HYSTERECTOMY    . BASAL CELL CARCINOMA EXCISION    . CATARACT EXTRACTION Bilateral   . COLONOSCOPY  2012  . MASS EXCISION Left 11/02/2014   Procedure: EXCISION MASS LEFT ELBOW;  Surgeon: Daryll Brod, MD;  Location: [place];  Service: Orthopedics;  Laterality: Left;  . POLYPECTOMY    . UPPER GASTROINTESTINAL ENDOSCOPY    . VESICOVAGINAL FISTULA CLOSURE W/ TAH     "sling" after hystrectomy per patient    Outpatient Encounter Medications as of 06/20/2017  Medication Sig  . ALPRAZolam (XANAX) 0.25 MG tablet Take 1/2 to 1 tablet by mouth three times daily  . amLODipine (NORVASC) 2.5 MG tablet Take 1 tablet (2.5 mg total) daily by mouth.  [name] aspirin 81 MG tablet Take 81 mg by mouth daily.    . B Complex Vitamins (VITAMIN-B COMPLEX PO) Take 1 capsule by mouth daily.   . Calcium Carbonate (CALTRATE 600 PO) Take 1 tablet by mouth 2 (two) times daily.    . cholecalciferol (VITAMIN D) 1000 units tablet Take 1,000 Units by mouth daily.  . Esomeprazole Magnesium (NEXIUM 24HR) 20 MG TBEC Take 1 tablet by mouth 2 (two) times daily.  . Glucosamine HCl (GLUCOSAMINE PO) Liquid form - Take by mouth as directed once daily  . meclizine (ANTIVERT) 25 MG tablet Take 1/2-1 tablet by mouth every 4 hours as needed for dizziness  . Multiple Vitamins-Minerals (MULTIVITAL) tablet Take 1 tablet by mouth daily.    . Omega-3 Fatty Acids (FISH OIL) 1000 MG CAPS Take 1 capsule by mouth daily.    . ondansetron (ZOFRAN) 8 MG tablet 1 tablet by mouth every 6 hours as needed for nausea  . predniSONE (DELTASONE) 5 MG tablet TAKE 1 TABLET(5 MG) BY MOUTH DAILY WITH BREAKFAST  . simvastatin (ZOCOR) 40 MG tablet Take 1 tablet (40 mg total) by mouth at bedtime.  . sucralfate (CARAFATE) 1 g tablet Take 1 tablet (1 g total) by mouth 2 (two) times daily.  [name] azithromycin (ZITHROMAX) 250 MG tablet Take as directed.  . [EXPIRED] methylPREDNISolone acetate (DEPO-MEDROL) injection  80 mg    No facility-administered encounter medications on file as of 06/20/2017.     Allergies  Allergen Reactions  . 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 Amoxicillin-Pot Clavulanate     REACTION: causes diarrhea  . Codeine     REACTION: nausea  . Levaquin [Levofloxacin In D5w]     Had itching w/ intravenous, not sure if reaction was from abx or the need to change the IV.  Does well when taken with Benadryl. Pt states can take orally with no issues   . Tdap [Diphth-Acell Pertussis-Tetanus]     Area raised, warm to touch, tender, swollen, pt felt jittery, nausea and some SOB  . Tape Rash    Blisters skin and removes skin --NEEDS PAPER TAPE    Immunization History  Administered Date(s) Administered  . Influenza Split 05/23/2011, 05/23/2012, 05/04/2013, 05/31/2014  . Influenza Whole 09/11/2009, 05/08/2010  . Influenza, High Dose Seasonal PF 05/02/2016, 05/08/2017  . Influenza-Unspecified 05/25/2015  . Pneumococcal Polysaccharide-23 05/17/2010  . Tdap 11/21/2011   Current Medications, Allergies, Past Medical History, Past Surgical History, Family History, and Social History were reviewed in [employer] record.   Review of Systems         See HPI - all other systems neg except as noted... The patient denies anorexia, fever, weight loss, weight gain, vision loss, decreased hearing, hoarseness, chest pain, syncope, dyspnea on exertion, peripheral edema, prolonged cough, headaches, hemoptysis, abdominal pain, melena, hematochezia, severe indigestion/heartburn, hematuria, incontinence, muscle weakness, suspicious skin lesions, transient blindness, difficulty walking, depression, unusual weight change, abnormal bleeding, enlarged lymph nodes, and angioedema.     Objective:   Physical Exam    WD, WN, 76 y/o WF in NAD... GENERAL:  Alert & oriented; pleasant &  cooperative... HEENT:  [place]/AT, EOM-wnl, PERRLA, EACs-clear, TMs-wnl, NOSE-clear, THROAT-clear & wnl. NECK:  Supple w/ fairROM; no JVD; normal carotid impulses w/o bruits; no thyromegaly or nodules palpated; no lymphadenopathy. CHEST:  Clear to P & A; without wheezes/ rales/ or rhonchi. HEART:  Regular Rhythm; without murmurs/ rubs/ or gallops. ABDOMEN:  Soft & nontender; normal bowel sounds; no organomegaly or masses detected. EXT: without deformities, mild arthritic changes; no varicose veins/ +venous insuffic/ no edema. NEURO:  CN's intact;  no focal neuro deficits... DERM:  No lesions noted; no rash etc...  RADIOLOGY DATA:  Reviewed in the EPIC EMR & discussed w/ the patient...  LABORATORY DATA:  Reviewed in the EPIC EMR & discussed w/ the patient...   Assessment & Plan:    DYSPNEA w/ thorough PULM & CARDIAC eval>> underlying sarcoidosis w/ mild pulm fibrosis & prev no signs of dis activity- on watchful waiting protocol; Cards eval by DrTaylor was neg as well x PVCs & PACs; she has improved on a gradual exercise program, avoids caffeine & stress...  SARCOID>  Previous CXR/ PFT/ ACE stable & she remains asymptomatic, nodule left elbow excised & path revealed nonnecrotizing granulomata... ~  12/2015>  FluA illness 09/2015 w/ CXR via ER showed some progression of the BHA & parenchymal abnormalities; CT Chest 12/2015 confirmed & ACE=62; We decided to start Rx w/ PRED 40-30-20 Q2wk taper w/ ROV/ in 6wks. ~  02/2016>  Clinically ?sl better w/ a few Pred side effects, ACE improved to 23 & we decided to cut the Pred to 20-10 Qod til ret 6wks... ~  03/2016>  CXR shows sl improvement on the Pred 20-10Qod;  We decided to continue this dose thru Oct & decr to '10mg'$ /d starting in Nov w/ ROV in 347mo ~  06/2016>  Laura [name] some difficulty w/ the Pred but she is improved overall & ACE down to 28; we decided to change to MEDROL '8mg'$  tabs taking one Qam for Dec & cut to '8mg'$  alternating w/ '4mg'$  QOD starting  Jan2018=> we will recheck her in Feb w/ f/u CTChest due and also due for 89yrecheck BMD ~  11/27/16>   We discussed decreasing her Pred from 10-5 Qod to '5mg'$ /d w/ routine rov in 39m42mo 03/07/17>   With the incr in ACE to 53- we decided to continue the Pred '5mg'$  daily; she is asked to maintain her exercise program etc; she will call prn any resp problems and we plan ROV recheck in 3-45mo32mo~  06/20/17>   Laura [name] an upper resp infection on top of her chronic sarcoidosis- on Pred '5mg'$ /d for the last 47mo 45moo & her CXR is stable and inflamm markers are neg + ACE=42;  We discussed OTC rx w/ Antihist & Flonase + Rx w/ ZPak;  We decided to keep the Pred '5mg'$ /d for her sarcoidosis in light of her CXR changes (likely scarring now) and ACE 42.   HBP>  DrHunter added Amlodipine 7 BP is back to normal...  CHOL>  Stable on the Simva40 + diet...  GERD>  On Nexium & Carafate, she is currently improved from her prev symptoms...  DJD/ LBP/ Osteopenia>  As noted she remains stable on current meds & exercise program; BMD 2/16 w/ Tscore -1.8 & rec to start FOSAMAX70=> she didn't stick w/ it & was eval by endocrine DrEllison w/ rec for PROLIA- Q47mo..739moompression fx L1 after fall> she is s/p L1 kyphoplasty...  Other medical issues as noted...     Medication List        Accurate as of 06/20/17 11:59 PM. Always use your most recent med list.          ALPRAZolam 0.25 MG tablet Commonly known as:  XANAX Take 1/2 to 1 tablet by mouth three times daily   amLODipine 2.5 MG tablet Commonly known as:  NORVASC Take 1 tablet (2.5 mg total) daily by mouth.   aspirin 81 MG tablet   azithromycin 250 MG tablet Commonly known as:  ZITHROMAX Take as directed.   CALTRATE 600 PO   cholecalciferol 1000 units tablet Commonly known as:  VITAMIN D   Fish Oil 1000 MG Caps   GLUCOSAMINE PO   meclizine 25 MG tablet Commonly known as:  ANTIVERT   MULTIVITAL tablet   NEXIUM 24HR 20 MG Tbec Generic drug:   Esomeprazole Magnesium   ondansetron 8 MG tablet Commonly known as:  ZOFRAN 1 tablet by mouth every 6 hours as needed for nausea   predniSONE 5 MG tablet Commonly known as:  DELTASONE TAKE 1 TABLET(5 MG) BY MOUTH DAILY WITH BREAKFAST   simvastatin 40 MG tablet Commonly known as:  ZOCOR Take 1 tablet (40 mg total) by mouth at bedtime.   sucralfate 1 g tablet Commonly known as:  CARAFATE Take 1 tablet (1 g total) by mouth 2 (two) times daily.   VITAMIN-B COMPLEX PO       Where to Get Your Medications    [facility] [address] [facility] [address]  [place], [place]  [address]   Phone:  [phone]   azithromycin 250 MG tablet

## 2017-06-21 NOTE — Assessment & Plan Note (Signed)
S: simvastatin reduced to 20mg  as starting amlodipine and max dose simvastatin is 20mg  (she does not need new rx for the 20mg  as pillcutter working well on 40mg  tablets) A/P: will monitor now on amlodipine and simvastatin 20mg  instead of simvastatin 40mg - next labs may consider LDL with target LDL under 100

## 2017-06-21 NOTE — Progress Notes (Signed)
Subjective:  Laura [name] is a 76 y.o. year old very pleasant female patient who presents for/with See problem oriented charting ROS- No chest pain or shortness of breath. No headache or blurry vision.  No edema. Some cough, congestion, fatigue - being treated for respiratory infection by pulmonary   Past Medical History-  Patient Active Problem List   Diagnosis Date Noted  . Essential hypertension 06/04/2017    Priority: High  . Steroid-induced hyperglycemia 10/18/2016    Priority: High  . Sarcoidosis 11/21/2011    Priority: High  . Hyperlipidemia 10/18/2016    Priority: Medium  . Osteoporosis 12/06/2014    Priority: Medium  . Heart palpitations 07/26/2014    Priority: Medium  . Dyspnea 08/20/2013    Priority: Medium  . GERD with stricture 10/12/2011    Priority: Medium  . Uveitis 09/21/2007    Priority: Medium  . Recurrent UTI 09/09/2007    Priority: Medium  . Anxiety state 08/06/2007    Priority: Medium  . History of colonic polyps 08/06/2007    Priority: Medium  . History of basal cell cancer 10/18/2016    Priority: Low  . Compression fx, lumbar spine ([place]) 09/24/2012    Priority: Low  . Pneumonia 09/24/2012    Priority: Low  . Osteoarthritis 09/21/2007    Priority: Low  . IBS (irritable bowel syndrome) 08/06/2007    Priority: Low  . LOW BACK PAIN, CHRONIC 08/06/2007    Priority: Low    Medications- reviewed and updated Current Outpatient Medications  Medication Sig Dispense Refill  . ALPRAZolam (XANAX) 0.25 MG tablet Take 1/2 to 1 tablet by mouth three times daily 90 tablet 5  . amLODipine (NORVASC) 5 MG tablet Take 1 tablet (5 mg total) by mouth daily. 30 tablet 5  . aspirin 81 MG tablet Take 81 mg by mouth daily.      [name] azithromycin (ZITHROMAX) 250 MG tablet Take as directed. 6 tablet 0  . B Complex Vitamins (VITAMIN-B COMPLEX PO) Take 1 capsule by mouth daily.     . Calcium Carbonate (CALTRATE 600 PO) Take 1 tablet by mouth 2 (two) times daily.       . cholecalciferol (VITAMIN D) 1000 units tablet Take 1,000 Units by mouth daily.    . Esomeprazole Magnesium (NEXIUM 24HR) 20 MG TBEC Take 1 tablet by mouth 2 (two) times daily.    . Glucosamine HCl (GLUCOSAMINE PO) Liquid form - Take by mouth as directed once daily    . meclizine (ANTIVERT) 25 MG tablet Take 1/2-1 tablet by mouth every 4 hours as needed for dizziness    . Multiple Vitamins-Minerals (MULTIVITAL) tablet Take 1 tablet by mouth daily.      . Omega-3 Fatty Acids (FISH OIL) 1000 MG CAPS Take 1 capsule by mouth daily.      . ondansetron (ZOFRAN) 8 MG tablet 1 tablet by mouth every 6 hours as needed for nausea 25 tablet 5  . predniSONE (DELTASONE) 5 MG tablet TAKE 1 TABLET(5 MG) BY MOUTH DAILY WITH BREAKFAST 90 tablet 0  . simvastatin (ZOCOR) 40 MG tablet Take 1 tablet (40 mg total) by mouth at bedtime. 90 tablet 3  . sucralfate (CARAFATE) 1 g tablet Take 1 tablet (1 g total) by mouth 2 (two) times daily. 60 tablet 2   No current facility-administered medications for this visit.     Objective: BP 140/72 (BP Location: Left Arm, Patient Position: Sitting, Cuff Size: Large)   Pulse 88   Temp 98.3 F (  36.8 C) (Oral)   Ht 5\' 8"  (1.727 m)   Wt 175 lb 6.4 oz (79.6 kg)   SpO2 96%   BMI 26.67 kg/m  Gen: NAD, resting comfortably CV: RRR no murmurs rubs or gallops Lungs: CTAB no crackles, wheeze, rhonchi Ext: no edema Skin: warm, dry  Assessment/Plan:  Being treated by Dr. Lenna Gilford with azithromycin for respiratory infection on top of sarcoid- has felt run down.   Essential hypertension S: controlled mildly poorly on amlodipine 2.5mg  .  Home #s ranging anywhere 120s to 150s.  BP Readings from Last 3 Encounters:  06/21/17 140/72  06/20/17 126/64  06/04/17 (!) 158/74  A/P: We discussed blood pressure goal of <140/90. Continue current meds:  But increse dose to amlodipine 5mg  with follow up before Christmas. She states fatigue resolved from last visit. She apparently did have some  headaches and those also resolved   Hyperlipidemia S: simvastatin reduced to 20mg  as starting amlodipine and max dose simvastatin is 20mg  (she does not need new rx for the 20mg  as pillcutter working well on 40mg  tablets) A/P: will monitor now on amlodipine and simvastatin 20mg  instead of simvastatin 40mg - next labs may consider LDL with target LDL under 100  Watch for edema Future Appointments  Date Time Provider [place]  09/19/2017 10:30 AM Noralee Space, MD [facility] None  11/05/2017 10:00 AM Marin Olp, MD [facility]  11/18/2017  2:00 PM Renato Shin, MD [facility] None   Meds ordered this encounter  Medications  . amLODipine (NORVASC) 5 MG tablet    Sig: Take 1 tablet (5 mg total) by mouth daily.    Dispense:  30 tablet    Refill:  5    Return precautions advised.  Garret Reddish, MD

## 2017-07-08 ENCOUNTER — Encounter: Payer: Self-pay | Admitting: Family Medicine

## 2017-07-08 ENCOUNTER — Ambulatory Visit (INDEPENDENT_AMBULATORY_CARE_PROVIDER_SITE_OTHER): Payer: Medicare Other | Admitting: Family Medicine

## 2017-07-08 VITALS — BP 150/60 | HR 92 | Temp 98.7°F | Ht 68.0 in | Wt 174.8 lb

## 2017-07-08 DIAGNOSIS — I10 Essential (primary) hypertension: Secondary | ICD-10-CM

## 2017-07-08 MED ORDER — AMLODIPINE BESYLATE 10 MG PO TABS: 10.0000 mg | ORAL_TABLET | Freq: Every day | ORAL | 5 refills | Status: DC

## 2017-07-08 NOTE — Patient Instructions (Addendum)
Continue simvastatin 20mg  (half tablet)  Increase amlodipine to 10mg  (can take 2 of the 5mg  pills) then pick up 10mg  after that. Return in 1 month for repeat evaluation. Great job on home checks- bring your log back next visit and also bring your cuff with you.

## 2017-07-08 NOTE — Progress Notes (Signed)
Subjective:  Laura [name] is a 76 y.o. year old very pleasant female patient who presents for/with See problem oriented charting ROS- No chest pain or shortness of breath. No headache or blurry vision.    Past Medical History-  Patient Active Problem List   Diagnosis Date Noted  . Essential hypertension 06/04/2017    Priority: High  . Steroid-induced hyperglycemia 10/18/2016    Priority: High  . Sarcoidosis 11/21/2011    Priority: High  . Hyperlipidemia 10/18/2016    Priority: Medium  . Osteoporosis 12/06/2014    Priority: Medium  . Heart palpitations 07/26/2014    Priority: Medium  . Dyspnea 08/20/2013    Priority: Medium  . GERD with stricture 10/12/2011    Priority: Medium  . Uveitis 09/21/2007    Priority: Medium  . Recurrent UTI 09/09/2007    Priority: Medium  . Anxiety state 08/06/2007    Priority: Medium  . History of colonic polyps 08/06/2007    Priority: Medium  . History of basal cell cancer 10/18/2016    Priority: Low  . Compression fx, lumbar spine ([place]) 09/24/2012    Priority: Low  . Pneumonia 09/24/2012    Priority: Low  . Osteoarthritis 09/21/2007    Priority: Low  . IBS (irritable bowel syndrome) 08/06/2007    Priority: Low  . LOW BACK PAIN, CHRONIC 08/06/2007    Priority: Low    Medications- reviewed and updated Current Outpatient Medications  Medication Sig Dispense Refill  . ALPRAZolam (XANAX) 0.25 MG tablet Take 1/2 to 1 tablet by mouth three times daily 90 tablet 5  . amLODipine (NORVASC) 5 MG tablet Take 1 tablet (5 mg total) by mouth daily. 30 tablet 5  . aspirin 81 MG tablet Take 81 mg by mouth daily.      . B Complex Vitamins (VITAMIN-B COMPLEX PO) Take 1 capsule by mouth daily.     . Calcium Carbonate (CALTRATE 600 PO) Take 1 tablet by mouth 2 (two) times daily.      . cholecalciferol (VITAMIN D) 1000 units tablet Take 1,000 Units by mouth daily.    . Esomeprazole Magnesium (NEXIUM 24HR) 20 MG TBEC Take 1 tablet by mouth 2 (two)  times daily.    . Glucosamine HCl (GLUCOSAMINE PO) Liquid form - Take by mouth as directed once daily    . meclizine (ANTIVERT) 25 MG tablet Take 1/2-1 tablet by mouth every 4 hours as needed for dizziness    . Multiple Vitamins-Minerals (MULTIVITAL) tablet Take 1 tablet by mouth daily.      . Omega-3 Fatty Acids (FISH OIL) 1000 MG CAPS Take 1 capsule by mouth daily.      . ondansetron (ZOFRAN) 8 MG tablet 1 tablet by mouth every 6 hours as needed for nausea 25 tablet 5  . predniSONE (DELTASONE) 5 MG tablet TAKE 1 TABLET(5 MG) BY MOUTH DAILY WITH BREAKFAST 90 tablet 0  . simvastatin (ZOCOR) 40 MG tablet Take 1 tablet (40 mg total) by mouth at bedtime. 90 tablet 3  . sucralfate (CARAFATE) 1 g tablet Take 1 tablet (1 g total) by mouth 2 (two) times daily. 60 tablet 2   Objective: BP (!) 150/60 (BP Location: Left Arm, Patient Position: Sitting, Cuff Size: Large)   Pulse 92   Temp 98.7 F (37.1 C) (Oral)   Ht 5\' 8"  (1.727 m)   Wt 174 lb 12.8 oz (79.3 kg)   SpO2 96%   BMI 26.58 kg/m  Gen: NAD, resting comfortably CV: RRR no murmurs  rubs or gallops Lungs: CTAB no crackles, wheeze, rhonchi Ext: no edema Skin: warm, dry  Assessment/Plan:  Essential hypertension S: controlled poorly  amlodipine 5mg . Home #s previously 120s to 150s now - #s actually appear slightly higher with 23/37 readings above 580 systolic. All diastolic controlled  BP Readings from Last 3 Encounters:  07/08/17 (!) 150/60  06/21/17 140/72  06/20/17 126/64  A/P: We discussed blood pressure goal of <140/90 but at least 150/90 per [facility]. Continue current meds:  But increase dose to 10mg  and follow up 1 month  Future Appointments  Date Time Provider [place]  08/02/2017  1:15 PM Marin Olp, MD [facility]  09/19/2017 10:30 AM Noralee Space, MD [facility] None  11/05/2017 10:00 AM Marin Olp, MD [facility]  11/18/2017  2:00 PM Renato Shin, MD [facility] None   Meds ordered this encounter   Medications  . amLODipine (NORVASC) 10 MG tablet    Sig: Take 1 tablet (10 mg total) by mouth daily.    Dispense:  30 tablet    Refill:  5   Return precautions advised.  Garret Reddish, MD

## 2017-07-08 NOTE — Assessment & Plan Note (Signed)
S: controlled poorly  amlodipine 5mg . Home #s previously 120s to 150s now - #s actually appear slightly higher with 23/37 readings above 701 systolic. All diastolic controlled  BP Readings from Last 3 Encounters:  07/08/17 (!) 150/60  06/21/17 140/72  06/20/17 126/64  A/P: We discussed blood pressure goal of <140/90 but at least 150/90 per [facility]. Continue current meds:  But increase dose to 10mg  and follow up 1 month

## 2017-07-09 ENCOUNTER — Telehealth: Payer: Self-pay

## 2017-07-09 NOTE — Telephone Encounter (Signed)
Patient has declined prolia at this time and will discuss further with Dr. Loanne Drilling in April at her next ov

## 2017-07-12 ENCOUNTER — Ambulatory Visit: Payer: Self-pay | Admitting: *Deleted

## 2017-07-12 ENCOUNTER — Telehealth: Payer: Self-pay | Admitting: Family Medicine

## 2017-07-12 NOTE — Telephone Encounter (Signed)
Called patient back and reviewed instructions for decreasing her medication per Dr Yong Channel and monitoring her blood pressure and symptoms. I explained that if symptoms get worse or she starts experiencing shortness of breath or chest pain then she should go to the Emergency Room. Patient will monitor and give office a call on Monday.

## 2017-07-12 NOTE — Telephone Encounter (Signed)
Cassie- lets have her go back to the 5mg  dose and give [place] an update on Monday with Bps for Saturday, Sunday, and Monday morning- I amy add an alternate medicine at that time but want to make sure her symptoms resolve first.   Please give her return precautions for weekend care if new or worsening symptoms

## 2017-07-12 NOTE — Telephone Encounter (Signed)
Pt has question about Amlodipine.  New symptoms since increased dosage from 5mg  to 10 mg on 07/09/17. Mild ongoing dizziness, headache, palpitations,unable to report how often they occur. B/P this am 145/67 HR 75. Pt wanting to know if she should take this mornings dose and continue on this dosage.  Reason for Disposition . Caller has NON-URGENT medication question about med that PCP prescribed and triager unable to answer question  Answer Assessment - Initial Assessment Questions 1. SYMPTOMS: "Do you have any symptoms?"    Palpitations, headache, dizziness. Ongoing since Tuesday 07/09/17 when Amlodipine dose increased to 10mg  from 5mg . B/P this am 145/67. HR 75. Unable to verbalize how often palpitations occur. 2. SEVERITY: If symptoms are present, ask "Are they mild, moderate or severe?"     Mild.  Protocols used: MEDICATION QUESTION CALL-A-AH

## 2017-07-12 NOTE — Telephone Encounter (Signed)
Copied from [place]. Topic: Inquiry >> Jul 12, 2017 10:33 AM Cecelia Byars, NT wrote: Reason for CRM: Patient returning call from [place] please call her back

## 2017-07-12 NOTE — Telephone Encounter (Signed)
Called and left voicemail requesting return call regarding previous note.

## 2017-07-12 NOTE — Telephone Encounter (Signed)
See other note

## 2017-08-02 ENCOUNTER — Encounter: Payer: Self-pay | Admitting: Family Medicine

## 2017-08-02 ENCOUNTER — Ambulatory Visit (INDEPENDENT_AMBULATORY_CARE_PROVIDER_SITE_OTHER): Payer: Medicare Other | Admitting: Family Medicine

## 2017-08-02 VITALS — BP 138/74 | HR 83 | Temp 98.2°F | Ht 68.0 in | Wt 176.8 lb

## 2017-08-02 DIAGNOSIS — I10 Essential (primary) hypertension: Secondary | ICD-10-CM | POA: Diagnosis not present

## 2017-08-02 NOTE — Patient Instructions (Signed)
Continue amlodipine 2.5 mg   Lets follow up in April as planned  Since blood pressure cuff seems a little high at home and since you have such variation and you are controlled today- lets have you hold off on checking at home regularly. Happy to see you back to recheck if any concern- you can still spot check if you want and if consistently above 150-55 let us know (since your cuff seems to run about 10 points higher)

## 2017-08-02 NOTE — Progress Notes (Signed)
Subjective:  Laura [name] is a 77 y.o. year old very pleasant female patient who presents for/with See problem oriented charting ROS- baseline shortness of breath- no increase. No chest pain. No edema. Lightheadedness has resolved   Past Medical History-  Patient Active Problem List   Diagnosis Date Noted  . Steroid-induced hyperglycemia 10/18/2016    Priority: High  . Sarcoidosis 11/21/2011    Priority: High  . Essential hypertension 06/04/2017    Priority: Medium  . Hyperlipidemia 10/18/2016    Priority: Medium  . Osteoporosis 12/06/2014    Priority: Medium  . Heart palpitations 07/26/2014    Priority: Medium  . Dyspnea 08/20/2013    Priority: Medium  . GERD with stricture 10/12/2011    Priority: Medium  . Uveitis 09/21/2007    Priority: Medium  . Recurrent UTI 09/09/2007    Priority: Medium  . Anxiety state 08/06/2007    Priority: Medium  . History of colonic polyps 08/06/2007    Priority: Medium  . History of basal cell cancer 10/18/2016    Priority: Low  . Compression fx, lumbar spine ([place]) 09/24/2012    Priority: Low  . Pneumonia 09/24/2012    Priority: Low  . Osteoarthritis 09/21/2007    Priority: Low  . IBS (irritable bowel syndrome) 08/06/2007    Priority: Low  . LOW BACK PAIN, CHRONIC 08/06/2007    Priority: Low    Medications- reviewed and updated Current Outpatient Medications  Medication Sig Dispense Refill  . ALPRAZolam (XANAX) 0.25 MG tablet Take 1/2 to 1 tablet by mouth three times daily 90 tablet 5  . amLODipine (NORVASC) 5 MG tablet Take 2.5 mg by mouth daily.  5  . aspirin 81 MG tablet Take 81 mg by mouth daily.      . B Complex Vitamins (VITAMIN-B COMPLEX PO) Take 1 capsule by mouth daily.     . Calcium Carbonate (CALTRATE 600 PO) Take 1 tablet by mouth 2 (two) times daily.      . cholecalciferol (VITAMIN D) 1000 units tablet Take 1,000 Units by mouth daily.    . Esomeprazole Magnesium (NEXIUM 24HR) 20 MG TBEC Take 1 tablet by mouth 2  (two) times daily.    . Glucosamine HCl (GLUCOSAMINE PO) Liquid form - Take by mouth as directed once daily    . meclizine (ANTIVERT) 25 MG tablet Take 1/2-1 tablet by mouth every 4 hours as needed for dizziness    . Multiple Vitamins-Minerals (MULTIVITAL) tablet Take 1 tablet by mouth daily.      . Omega-3 Fatty Acids (FISH OIL) 1000 MG CAPS Take 1 capsule by mouth daily.      . ondansetron (ZOFRAN) 8 MG tablet 1 tablet by mouth every 6 hours as needed for nausea 25 tablet 5  . predniSONE (DELTASONE) 5 MG tablet TAKE 1 TABLET(5 MG) BY MOUTH DAILY WITH BREAKFAST 90 tablet 0  . simvastatin (ZOCOR) 40 MG tablet Take 1 tablet (40 mg total) by mouth at bedtime. 90 tablet 3  . sucralfate (CARAFATE) 1 g tablet Take 1 tablet (1 g total) by mouth 2 (two) times daily. 60 tablet 2   No current facility-administered medications for this visit.     Objective: BP 138/74   Pulse 83   Temp 98.2 F (36.8 C) (Oral)   Ht 5\' 8"  (1.727 m)   Wt 176 lb 12.8 oz (80.2 kg)   SpO2 97%   BMI 26.88 kg/m  Gen: NAD, resting comfortably CV: RRR no murmurs rubs or gallops  Lungs: CTAB no crackles, wheeze, rhonchi Abdomen: soft/nontender/nondistended/normal bowel sounds.  Ext: no edema Skin: warm, dry  Assessment/Plan:  Other notes 1. Also reports stable dyspnea since 2015- she had echocardiogram which was reassuring and also PFTs 2015 and 2017 through pulmonary. Has sarcoidosis as well on chronic steroids followed by Dr. Lenna Gilford 2. Steroid induced hyperglycemia- consider updating a1c at next visit. Had one up to 6.7 only- would not diagnose as DM unless consistently above 6.5 Lab Results  Component Value Date   HGBA1C 6.1 05/08/2017  3. Need to discuss prevnar 13 next visit  Essential hypertension S: controlled poorly on initial check with amlodipine 2.5mg . On repeat was just under goal.   She had been on 10mg  but was having orthostatic symptoms. She also had some facial flushing and palpitations.   She  went down to 5mg  and 1/8 readings above 150. She was having some palpitations still and decided to go to 2.5 mg on her own (and symptoms resolved- though we discussed importane of keeping PCP in loop).    37 home readings done since going down to 5mg  and 16 of those readings above 150.   We had originally been increasing medication due to elevations at home primarily. She brings her home cuff today which consistently reads at least 10 points above our readings. She has had increased responsibility at home in caring for her husband with memory loss  BP Readings from Last 3 Encounters:   Repeat 138/74. Home cuff 148/76  08/02/17 (!) 146/72. Home cuff reads 168/85  07/08/17 (!) 150/60  06/21/17 140/72  A/P: We discussed blood pressure goal of <140/90 but at least less than 150/90 given orthostatic symptoms- continue at lower dose 2.5mg  amlodipine given control today. I would prefer home readings to be tighter controlled- but I am reassured now knowing home cuff tends to run at least 10 points higher than our readings- she is going to check less at home given discrepancy. I do think she has a white coat element to blood pressure in office.    Future Appointments  Date Time Provider [place]  09/19/2017 10:30 AM Noralee Space, MD [facility] None  11/05/2017 10:00 AM Marin Olp, MD [facility]  11/18/2017  2:00 PM Renato Shin, MD [facility] None   The duration of face-to-face time during this visit was greater than 15 minutes. Greater than 50% of this time was spent in counseling, explanation of diagnosis, planning of further management, and/or coordination of care including discussing BP goals, stress of care giving, differences between home and in office BPs.    Return precautions advised.  Garret Reddish, MD

## 2017-08-03 NOTE — Assessment & Plan Note (Signed)
S: controlled poorly on initial check with amlodipine 2.5mg . On repeat was just under goal.   She had been on 10mg  but was having orthostatic symptoms. She also had some facial flushing and palpitations.   She went down to 5mg  and 1/8 readings above 150. She was having some palpitations still and decided to go to 2.5 mg on her own (and symptoms resolved- though we discussed importane of keeping PCP in loop).    37 home readings done since going down to 5mg  and 16 of those readings above 150.   We had originally been increasing medication due to elevations at home primarily. She brings her home cuff today which consistently reads at least 10 points above our readings. She has had increased responsibility at home in caring for her husband with memory loss  BP Readings from Last 3 Encounters:   Repeat 138/74. Home cuff 148/76  08/02/17 (!) 146/72. Home cuff reads 168/85  07/08/17 (!) 150/60  06/21/17 140/72  A/P: We discussed blood pressure goal of <140/90 but at least less than 150/90 given orthostatic symptoms- continue at lower dose 2.5mg  amlodipine given control today. I would prefer home readings to be tighter controlled- but I am reassured now knowing home cuff tends to run at least 10 points higher than our readings- she is going to check less at home given discrepancy. I do think she has a white coat element to blood pressure in office.

## 2017-08-29 DIAGNOSIS — Z85828 Personal history of other malignant neoplasm of skin: Secondary | ICD-10-CM | POA: Diagnosis not present

## 2017-08-29 DIAGNOSIS — Z08 Encounter for follow-up examination after completed treatment for malignant neoplasm: Secondary | ICD-10-CM | POA: Diagnosis not present

## 2017-08-29 DIAGNOSIS — L570 Actinic keratosis: Secondary | ICD-10-CM | POA: Diagnosis not present

## 2017-08-30 DIAGNOSIS — R69 Illness, unspecified: Secondary | ICD-10-CM | POA: Diagnosis not present

## 2017-09-12 ENCOUNTER — Other Ambulatory Visit: Payer: Self-pay | Admitting: Pulmonary Disease

## 2017-09-19 ENCOUNTER — Ambulatory Visit: Payer: Medicare Other | Admitting: Pulmonary Disease

## 2017-09-19 ENCOUNTER — Encounter: Payer: Self-pay | Admitting: Pulmonary Disease

## 2017-09-19 VITALS — BP 118/68 | HR 72 | Temp 97.8°F | Ht 68.0 in | Wt 175.2 lb

## 2017-09-19 DIAGNOSIS — M8000XA Age-related osteoporosis with current pathological fracture, unspecified site, initial encounter for fracture: Secondary | ICD-10-CM

## 2017-09-19 DIAGNOSIS — K222 Esophageal obstruction: Secondary | ICD-10-CM

## 2017-09-19 DIAGNOSIS — D869 Sarcoidosis, unspecified: Secondary | ICD-10-CM

## 2017-09-19 DIAGNOSIS — G8929 Other chronic pain: Secondary | ICD-10-CM

## 2017-09-19 DIAGNOSIS — M150 Primary generalized (osteo)arthritis: Secondary | ICD-10-CM | POA: Diagnosis not present

## 2017-09-19 DIAGNOSIS — I10 Essential (primary) hypertension: Secondary | ICD-10-CM | POA: Diagnosis not present

## 2017-09-19 DIAGNOSIS — K219 Gastro-esophageal reflux disease without esophagitis: Secondary | ICD-10-CM | POA: Diagnosis not present

## 2017-09-19 DIAGNOSIS — M545 Low back pain, unspecified: Secondary | ICD-10-CM

## 2017-09-19 DIAGNOSIS — M159 Polyosteoarthritis, unspecified: Secondary | ICD-10-CM

## 2017-09-19 DIAGNOSIS — S32010D Wedge compression fracture of first lumbar vertebra, subsequent encounter for fracture with routine healing: Secondary | ICD-10-CM

## 2017-09-19 NOTE — Progress Notes (Signed)
Subjective:    Patient ID: Okey Dupre, female    DOB: 09-21-1940, 77 y.o.   MRN: [id number]  HPI 77 y/o WF here for a follow up visit... she has prob Sarcoidosis w/ hx Uveitis and BHA on CXR => Pred therapy started 12/2015 due to progressive CXT/ CT abnormality...   ~  SEE PREV EPIC NOTES FOR OLDER DATA >>   LABS 5/13:  FLP- at goals on Simva40;  Chems- wnl;  CBC- wnl;  TSH=1.61;  VitD=47  ADDENDUM>> she received TDAP w/ local reaction req antihist & topical Rx...  CXR 11/13 showed similar changes- irreg nodular changes in right suprahilar region, scarring, stable adenopathy, mild atherosclerotic calcif, osteopenia...  ~  Saharra reports that in Feb2014 she fell while getting up at night & hit her back w/ severe pain & L1 compression fx- adm to [facility] for 2d w/ kyphoplasty & told to have LLLpneumonia treated w/ Levaquin.   CXR 3/14 showed normal heart size, atherosclerotic Ao, chr scarring from underlying sarcoid, NAD...  LABS 5/14:  FLP- at goals on Simva40;  Chems- wnl;  CBC- wnl;  TSH=1.74;  VitD=52... ~   Seen by DrTaylor for Cards 3/15> 2DEcho showed norm LVF, norm valves, no signs of pulmHTN; subseq stress testing showed extreme dyspnea w/ exercise (poor functional capacity), no ischemia; she has been encouraged to join silver sneakers and incr her exercise program...   LABS 1/15:  Chems- ok x K=3.4;  CBC- wnl;  TSH=2.28;  Mag=2.0.Marland KitchenMarland Kitchen  CXR 1/15 showed norm heart size, areas of scarring appear unchanged, prom hilae w/o change- stable, NAD.Marland KitchenMarland Kitchen   EKG 3/15 showed NSR, rate79, wnl, NAD...  Stress Test 3/15 showed poor exercise capacity, no ischemia...  2DEcho 3/15 showed normal LVF w/ EF=55-60%, no regional wall motion abn, normal valves, no evid for pulmHTN...  PFTs 5/15 showed just some sm airways dis> FVC=2.65 (78%), FEV1=1.86 (72%); %1sec=70; mid-flows=55% predicted... After bronchodil her FEV1 improved 6%... Lung Volumes & DLCO are wnl...   LABS 5/15:  ACE  level = 55     LABS 11/15:  FLP- looks good on Simva40, needs to lose wt;  Chems- wnl;  CBC- wnl;  TSH=2.19;  VitD=52...   ~  Dec 06, 2014:  [place]had [place] surg for a nodule & excision by DMelrose Nakayamashowed path=numerous nonnecrotizing granulomas, spec stains neg & this is c/w an unusual manifestation of her Sarcoid; she was told "scar tissue" and is improved now... We reviewed the following medical problems during today's office visit >>     Sarcoid> Hx abn CXR, she is asymptomatic; baseline CXR w/ right suprahilar nodule, adenop, scarring & chr changes; ?[place]neum 28/[place]/ [place]; last CXR 1/15 was stable and last ACE=55; she had nodule excised from left elbow 4/16 by DrKuzma=> granulomatous nodule c/w unusual manifewstation of Sarcoid, all spec stains were neg...    HxMVP, palpit w/ PVCs & PACs on Holter> on ASA81;  She denies CP, palpit, SOB now; advised no caffeine & avoid stress!    Chol> on Simva40 & FishOil; last FLP 11/15 showed TChol 169, TG 135, HDL 41, LDL 101; reviewed diet & wt reduction...    GI> GERD, IBS, Polyp> on Prilosec40Bid, Carafate1GmQhs, & Zofran prn; followed by DrJacobs- she remains asymptomatic w/o abd pain, n/v, c/d, blood seen...    UTIs> she reports no prob on Cranberry juice but had one recurrent UTI 4/16- Cipro cqalled in & symptoms resolved..Marland KitchenMarland KitchenMarland Kitchen  DJD, LBP, L1 compression w/ kyphoplasty, Osteop> on calcium, MVI, VitD & Tramadol50; she fell 2/14 w/ L1 compression & kyphoplasty in [place]; BMD 2/16 w/ Tscore-1.8 & we rec ALENDRONATE70/wk...    Anxiety> she has a lot of family support, doesn't require meds... We reviewed prob list, meds, xrays and labs> see below for updates >>   CXR 5/16 showed stable BHA & scarring on right, NAD, no change...  LABS 5/16:  ACE=56, stable...  ~  June 08, 2015:  45moRFlat Rockhad f/u DrPyrtle recently- GERD, esoph spasm, IBS, colon polyps & +FamHx colon ca in sister> Rx w/ Nexium20Bid, Carafate  prn, Cardizem30 prn esoph spasm and she is improved ;  She also had f/u Cards DrTaylor 12/23/14- palpit w/ PVCs and PACs, improved and rec to f/u prn... Note that she is off prev Fosamax Rx- took it for 312mo stopped due to reflux- offered Binoso but she prefers off Rx (BMD 2/16- Tscore -1.8) & she takes Ca & VitD supplement... Prob list as above> EXAM shows Afeb, VSS, O2sat=95% on RA;  HEENT- neg, Mallampati1;  Chest- clear w/o w/r/r;  Heart- RR w/o m/r/g;  Abd- soft, nontender, neg;  Ext- neg w/o c/c/e;  Neuro- intact w/o focal abn...  LABS 06/07/16>  FLP- all parameters at goals on Simva40;  Chems- wnl;  CBC- wnl;  TSH=1.86;  VitD=52;  ACE=63   IMP/PLAN>>  [name] stable on current regimen- continue same meds and f/u in 70m9mo  ~  December 26, 2015:  6-29mo529mo [place]ains asymptomatic, feeling well, and denies CP/ palpit. Cough/ phlegm/ hemoptysis, SOB, edema, etc;  She tells me that she got the Flu at the end of March noting HA, sore throat, severe cough w/o much sput, low grade temp, aching & sore, etc & was seen in the ER where CXR showed bilat hilar adenopathy & increased curvilinear thickening on the right but no acute process;  Flu panel was POS for TypeA flu;  She had f/u OV w/ TP shortly thereafter & given ZPak, Tessalon, Mucinex, Delsym, fluids;  She notes that it took her 8wks to fully resolve & get her energy back!  We reviewed the following medical problems during today's office visit >>     Ophthalmology> Hx uveitis, s/p bilat cat surg, [facility]   Sarcoid> Hx uveitis & abn CXR w/ BHA, she is asymptomatic; baseline CXR w/ adenop, scarring & chr changes; hx ?LLLpneum 2/146/76[place] w/ Levaquin; last CXR 5/16 was stable and last ACE=63; she had nodule excised from left elbow 4/16 by DrKuzma=> granulomatous nodule c/w unusual manifestation of Sarcoid, all spec stains were neg...    HxMVP, palpit w/ PVCs & PACs on Holter> on ASA81;  She denies CP, palpit, SOB now; advised no  caffeine & avoid stress!    Chol> on Simva40 & FishOil; last FLP 11/15 showed TChol 169, TG 135, HDL 41, LDL 101; reviewed diet & wt reduction...    GI> GERD, IBS, Polyp> on Prilosec40Bid, Carafate1GmQhs, & Zofran prn; followed by DrPyrtle- she remains asymptomatic w/o abd pain, n/v, c/d, blood seen...       ADDENDUM> she had colonoscopy 01/17/16> + for divertics, hemorrhoids, and several polyps- 4 to 70mm 35me & Path= sessile serrated adenomas & f/u suggested 3 yrs.    UTIs> she reports no prob on Cranberry juice but had one recurrent UTI 4/16- Cipro cqalled in & symptoms resolved....    DJD, LBP, L1 compression w/  kyphoplasty, Osteop> on calcium, MVI, VitD & Tramadol50; she fell 2/14 w/ L1 compression & kyphoplasty in [place]; BMD 2/16 w/ Tscore-1.8 & we rec ALENDRONATE70/wk...    Anxiety> she has a lot of family support, doesn't require meds... EXAM shows Afeb, VSS, O2sat=97% on RA;  HEENT- neg, Mallampati1;  Chest- clear w/o w/r/r;  Heart- RR w/o m/r/g;  Abd- soft, nontender, neg;  Ext- neg w/o c/c/e;  Neuro- intact w/o focal abn...  CXR 10/16/15> I have reviewed this film & compared to prev images>  bilat hilar adenopathy & increased opac in left superior hilum and right lat hilar region w/ extension into ant RUL (similar to 11/2014 films but progressed from previous CXRs) => we will proceed w/ f/u CT Chest...  LABS 10/16/15>  Flu panel showed POS FluA;  Neg FluB and H1N1...   LABS 12/2015>  ACE=62 (nl=8-52);  VitD=51;  BMet- ok x BS=131, Cr=0.74, Ca=9.4.Marland KitchenMarland Kitchen  CT Chest w/ contrast 01/06/16>  Norm heart size, aortic & coronary atherosclerosis, part calcif mediastinal adenopathy (unchanged from 2009), progression of bilat hilar adenopathy & pulm parenchymal changes w/ mass-like density in RML~4cm, and mass-like density in ant LUL ~4x2cm -- radiology feels this is c/w sarcoid progression.   Full PFTs 01/06/16>  FVC=2.66 (80%), FEV1=1.82 (72%), %1sec=68, mid-flows reduced at 53% predicted; post bronchodil  FEV1 improved 5% to 1.92;  TLC=5.57 (98%), RV=2.72 (108%), RV/TLC=49%;  DLCO=70% pred (DL/VA=96%). IMP/PLAN>>  Concern for active sarcoidosis w/ the recent CXR and CT changes; she has never had a biopsy due to remote presentation w/ BHA and uveitis and the fact that she has been devoid of resp symptoms, and prev had neg ACE levels (ie- no signs of dis activity);  Her recent CXR changes (progression), sl elev ACE in the low 60s, suggests some activity yet she remains stoic & asymptomatic;  She would like to proceed w/o lung Bx if poss & I discussed trial PREDNISONE w/ short term f/u to check ACE & CXR (if not improved then we will address need for lung bx at that time);  REC to start Pred40/d x2wks, 30/d x2wks, then 20/d til return in about 6wks time...   ~  February 22, 2016:  46moROV & f/u sarcoid> In Jun2017 we did CTChest showing norm heart size, aortic & coronary atherosclerosis, part calcif mediastinal adenopathy (unchanged from 2009), progression of bilat hilar adenopathy & pulm parenchymal changes w/ mass-like density in RML~4cm, and mass-like density in ant LUL ~4x2cm -- radiology feels this is c/w sarcoid progression;  ACE level= 62;  We discussed poss Bx but elected to start Pred therapy w/ 40-30-20 Q2wks and she returns today on Pred20/d noting various symptoms related to the Pred she feels eg- Ha, sweating, trembling, incr HR, & short tempered; these symptoms have diminished w/ decr in the dose; she also notes SOB, can't get a DB, & denies CP- but she has stepped up her walking program & doing better now;  We discussed checking ACE level & weaning Pred further based on this result...    EXAM shows Afeb, VSS, O2sat=97% on RA;  HEENT- neg, Mallampati1;  Chest- clear w/o w/r/r;  Heart- RR w/o m/r/g;  Abd- soft, nontender, neg;  Ext- neg w/o c/c/e;  Neuro- intact w/o focal abn...  LABS 02/2016>  BMet- ok w/ BS=100, A1c= 6.7, Cr=0.81, K=3.9;  ACE=23... IMP/PLAN>>  LKrystallseems somewhat improved on the Pred &  ACE is down to 23;  I suggest that she keep the Pred20/d for another 2wks  then cut it to 54m alt w/ 153mQod til return in 6 wks time...  ~  April 04, 2016:  6wk ROV & f/u sarcoid>  She reports stable on the Pred 20-10 Qod for the past month & doing better on the lower doses, BS better & wt stable at 170#- we reviewed low carb, low sodium diet;  She denies CP, SOB, cough, sput, hemoptysis, etc...    EXAM shows Afeb, VSS, O2sat=97% on RA;  HEENT- neg, Mallampati1;  Chest- clear w/o w/r/r;  Heart- RR w/o m/r/g;  Abd- soft, nontender, neg;  Ext- neg w/o c/c/e;  Neuro- intact w/o focal abn...  CXR 04/04/16>  Stable heart size, mild atherosclerosis of Ao, BHA & perihilar opacities sl decreased from prev films, no new airspace dis... IMP/PLAN>>  We decided to continue the Pred 2028mlt w/ 42m31md thru Oct & then decr to 42mg36mtarting in Nov until her f/u visit in 48mo w70mou CXR & Labs...  ~  June 26, 2016:  48mo RO148moLois reSharells on Pred 42mg/d 43mthe last month- she is still noting mult somatic complaints and blames the Pred=> we discussed change to MEDROL 8mg tabs58m see if she tolerates this better; she tells me she's been exercising 3d/wk at the gym & doing satis, no cough/ sputum, and dyspnea improving; she notes occas palpit "fluttering" and more aware of heart beat- I have tried to get her to take the Alpraz0.25mg tabs38m1/2 to1 tab Tid more regularly; we will recheck labs today, & she will need f/u CT Chest in early 2018 (14yr f/u sc7yr..    EXAM shows Afeb, VSS, O2sat=97% on RA;  HEENT- neg, Mallampati1;  Chest- clear w/o w/r/r;  Heart- RR w/o m/r/g;  Abd- soft, nontender, neg;  Ext- neg w/o c/c/e;  Neuro- intact w/o focal abn...  LABS 06/26/16> Chems- ok x BS=117;  ACE=28 IMP/PLAN>>  Crysta perceiAniayasome difficulty w/ the Pred but she is improved overall & ACE down to 28; we decided to change to MEDROL 8mg tabs ta80mgone Qam for Dec & cut to 8mg alternat18m w/ 4mg QOD start32m Jan2018=> we  will recheck her in Feb w/ f/u CTChest due and also due for 63yr recheck BM18yr Note: >50% of this 15min visit was37mnt in counseling and coordination of care.  ~  August 28, 2016:  66mo ROV & [place] i55moproved- no new complaints or concerns, she has maintained on Pred 10-5 Qod for her sarcoidosis;  She did not tolerate MEDROL saying that "it messed up my tummy" w/ cramps and diarrhea which resolved after switch to Pred...she denies cough, sput, SOB, CP, etc- no f/c/s... She is due for f/u CXR/ Labs today...    EXAM shows Afeb, VSS, O2sat=97% on RA;  HEENT- neg, Mallampati1;  Chest- clear w/o w/r/r;  Heart- RR w/o m/r/g;  Abd- soft, nontender, neg;  Ext- neg w/o c/c/e;  Neuro- intact w/o focal abn...  CXR 08/28/16 (independently reviewed by me in the PACS system) showed norm heart size, aortic atherosclerosis, similar BHA & incr markings unchanged from prev (by my review the markings are better than 09/2015 & similar to 9/17).   LABS 08/28/16>  Chems- ok x K=3.4, BS is wnl at 94, LFTs wnl, A1c=6.4,  ACE level = 24 => ok to cut Pred to 5mg po Qd... IMP/44mN>>  We will decr her Pred to 5mg daily, asked t77maintain her diet & exercise program, ROV in 48mo & consider CT666mo  f/u...  ~  Nov 27, 2016:  70moROV & f/u Sarcoidosis> LPatreciareports that her breathing is good & she has no new complaints or concerns, she is exercising in a gym for 1H 3d/wk, doing zoomba etc; current Pred dose is '10mg'$  alt w/ '5mg'$  Qod and she confirms- no cough, sput, or hemoptysis; her SOB/DOE is stable w/ no difficulty w/ ADLs but notes trouble w/ inclines and stairs; she is also under stress from mult fronts-- Jim's parkinsons & some mental changes, sisters LBarbaraann Share& RBertram Millardmoving to [place](LAieshais tearful & has a support group)... NOTE: she reminds me that her brother had Sarcoid in his 481s very servere, almost died, [place]..    She had f/u w/ PCP-DrHunter 10/18/16> establish care, hx Sarcoid, HL(on Simva40), DM  (A1c=6.4 on diet), GERD (on Nexium Bid), osteoporosis (referred to endocrine)...    She saw DrEllison 11/19/16 for osteoporosis> [facility] Fos[facility] for several mo in 2016 & stopped due to arthralgias, BMD 08/2016 showed lowest Tscore= -1.7 in RFN w/ incr risk;  REC to prevent falls, Ca '1200mg'$ /d, VitD 400u/d, & start Prolia 2x/yr (SPE- neg, TSH-0.79, VitD-69, PTH=wnl at 25)...    EXAM shows Afeb, VSS, O2sat=100% on RA;  HEENT- neg, Mallampati1;  Chest- clear w/o w/r/r;  Heart- RR w/o m/r/g;  Abd- soft, nontender, neg;  Ext- neg w/o c/c/e;  Neuro- intact w/o focal abn... IMP/PLAN>>  We discussed decreasing her Pred from 10-5 Qod to '5mg'$ /d w/ routine rov in 356moI also encouraged her use of the Prolia for her osteopenia + hx compression fx & need for Pred rx...  ~  March 07, 2017:  6m84moV & last visit LoiCashs doing well- exercising at gym & w/ zoomba, asymptomatic from the Sarcoid standpoint on Pred 10 alt w/ 5 qod- so we decided to decr to '5mg'$  daily... She reports stable on the Pred '5mg'$ /d over the last 6mo35mootes fewer side effects like sweating, irritability, etc; her breathing is good- no cough/ sputum/ hemoptysis; DOE is similar noted esp w/ inclines & stairs, no worse, no CP etc...     We reviewed the following medical problems during today's office visit >>     Ophthalmology> Hx uveitis, s/p bilat cat surg, [facility]   Sarcoid> Hx uveitis & abn CXR w/ BHA, she is asymptomatic; baseline CXR w/ adenop, scarring & chr changes; hx ?LLLpneum 2/146/30[place] w/ Levaquin; f/uCXR 5/16 was stable and ACE=63; she had nodule excised from left elbow 4/16 by DrKuzma=> granulomatous nodule c/w unusual manifestation of Sarcoid, all spec stains were neg... CT Chest 6/17 showed progression of bilat hilar adenopathy & pulm parenchymal changes w/ mass-like density in RML~4cm, and mass-like density in ant LUL ~4x2cm -- radiology feels this is c/w  sarcoid progression & ACE up to 63 => she declined bx & we started on Pred Rx=> CXR & ACE improved as we wean the Pred... NOTE: she reminds me that her brother had Sarcoid in his 40s,60sry servere, almost died, [place], palpit w/ PVCs & PACs on Holter> on ASA81;  She denies CP, palpit, SOB now; advised no caffeine & avoid stress!    Chol> on Simva40 & FishOil; last FLP 11/16 showed TChol 161, TG 146, HDL 44, LDL 87; reviewed diet & wt reduction...    GI> GERD, IBS, Polyp> on  Nexium200Bid, Carafate1GmQhs, & Zofran prn; followed by DrPyrtle- she remains asymptomatic w/o abd pain, n/v, c/d, blood seen...       ADDENDUM> she had colonoscopy 01/17/16> + for divertics, hemorrhoids, and several polyps- 4 to 41m size & Path= sessile serrated adenomas & f/u suggested 3 yrs.    UTIs> she reports no prob on Cranberry juice but had one recurrent UTI 4/16- Cipro cqalled in & symptoms resolved....    DJD, LBP, L1 compression w/ kyphoplasty, Osteop> on calcium, MVI, VitD & Tramadol50; she [place] 2[place]14 [place]16 w/ Tscore-1.8 & we rec ALENDRONATE70/wk=> she stopped & Endocrine consult DrEllisonn rec Prolia Q650mo.    Anxiety> she has a lot of family issues, on Xanax 0.25 prn... EXAM shows Afeb, VSS, O2sat=97% on RA;  HEENT- neg, Mallampati1;  Chest- clear w/o w/r/r;  Heart- RR w/o m/r/g;  Abd- soft, nontender, neg;  Ext- neg w/o c/c/e;  Neuro- intact w/o focal abn...  LABS 03/07/17>  ACE level = 53 (up from 24-28 range, but still wnl w/ norm range=9/67);  BMet- ok x BS=122;  CBC- OK w/ Hg=16.2, WBC=9.1 IMP/PLAN>>  With the incr in ACE to 53- we decided to continue the Pred '5mg'$  daily; she is asked to maintain her exercise program etc; she will call prn any resp problems and we plan ROV recheck in 3-35m49mo  ~  June 20, 2017:  3-35mo12mo [place] remained stable on the Pred '5mg'$ /d (for the last 70mo-80mo 74mo a51moumped up from 24=>53);  She reports an upper resp  infection over the last wk w/ sore throat, sinus congestion, HA, clear drainage, watery eyes, & "not feeling well" she says, and "my eyes are weak"; her temp was 98 which she notes is one degree of fever for her, denies chills/ sweats; she notes dry cough but denies chest congestion/ tightness/ wheezing...    Sarcoid> Hx uveitis & abn CXR w/ BHA, she is asymptomatic; baseline CXR w/ adenop, scarring & chr changes; hx ?LLLpneum 2/14 [place]9/[place]lisb[place]ed w/ Levaquin; f/uCXR 5/16 was stable and ACE=63; she had nodule excised from left elbow 4/16 by DrKuzma=> granulomatous nodule c/w unusual manifestation of Sarcoid, all spec stains were neg... CT Chest 6/17 showed progression of bilat hilar adenopathy & pulm parenchymal changes w/ mass-like density in RML~4cm, and mass-like density in ant LUL ~4x2cm -- radiology feels this is c/w sarcoid progression & ACE up to 63 => she declined bx & we started on Pred Rx=> CXR & ACE improved as we wean the Pred...  NOTE: she reminds me that her brother had Sarcoid in his 40s, v37s servere, almost died, [place] Afeb, VSS, O2sat=97% on RA;  HEENT- neg, Mallampati1;  Chest- clear w/o w/r/r;  Heart- RR w/o m/r/g;  Abd- soft, nontender, neg;  Ext- neg w/o c/c/e;  Neuro- intact w/o focal abn; no palp adenopathy...  CXR 06/20/17 (independently reviewed by me in the PACS system) showed chr hilar enlargement & architectural distortion stable since 2017, incr interstitial markings are similarly stable, scoliosis w/ upper lumbar augmnentation...  LABS 06/04/17 by DrHunter>  Chems- wnl x BS=116;  CBC- wnl w/ Hg=16.1;  TSH=1.29...  LABS 06/20/17>  ACE=42,  Sed=8,  CRP=0.3 IMP/PLAN>>  Sakshi hTaejahn upper resp infection on top of her chronic sarcoidosis- on Pred '5mg'$ /d for the last 70mo or45mo& her CXR is stable and inflamm markers are neg + ACE=42;  We discussed  OTC rx w/ Antihist & Flonase + Rx w/ ZPak;  We decided to keep the Pred '5mg'$ /d for her sarcoidosis in light of  her CXR changes (likely scarring now) and ACE 42...    ~  September 19, 2017:  41moROV & f/u sarcoid>  During her last OV 05/2017 LMadysynhad a URI superimposed on her chr sarcoid; CXR showed stable chr hilar enlargement & incr interstitial markings; ACE=42 on Pred'5mg'$ /d; & we decided to treat w/ ZPak + Antihist/ Flonase;  She responded to the Zithromax & ret to baseline;  Currently doing satis- feels at baseline & doing well overall, notes DOE w/ stairs & hills but exercising 3d/wk at gym, denies cough/ sput/ hemoptysis, denies f/c/s, denies CP/ palpit/ edema...     Sarcoid> Hx uveitis & abn CXR w/ BHA, she was mostly asymptomatic; baseline CXR w/ adenop, scarring & chr changes; hx ?LL[place]pneum 24/48[place]/ Lev[place]; f/uCXR 5/16 was stable and ACE=63; she had nodule excised from left elbow 4/16 by DrKuzma=> granulomatous nodule c/w unusual manifestation of Sarcoid, all spec stains were neg... CT Chest 6/17 showed progression of bilat hilar adenopathy & pulm parenchymal changes w/ mass-like density in RML~4cm, and mass-like density in ant LUL ~4x2cm -- radiology feels this is c/w sarcoid progression & ACE up to 63 => she declined bx & we started on Pred Rx=> CXR & ACE improved as we wean the Pred...  NOTE: she reminds me that her brother had Sarcoid in his 433s very servere, almost died, [place] his son had Wegener's!    Medical issues>  She saw PCP- DrHunter on 08/02/17> stable dyspnea, but BP was sl elev w/ palpit & she reported incr stress w/ husb parkinson's dis; on Amlod5 + low sodium diet; she monitors BP carefully at home & BP today = 120/70;  They treated UTI w/ Macrobid & note to DrHunter- try to avoid the Nitrofurantoin due to potential for pulm reaction...  She also declined Prolia for her osteoporosis & will discuss this further w/ DrEllison in April EXAM shows Afeb, VSS, O2sat=96% on RA;  HEENT- neg, Mallampati1;  Chest- clear w/o w/r/r;  Heart- RR w/o m/r/g;  Abd- soft,  nontender, neg;  Ext- neg w/o c/c/e;  Neuro- intact w/o focal abn; no palp adenopathy... IMP/PLAN>>  LTiandraappears stable from the pulm/ sarcoid standpoint on the low dose Pred '5mg'$ /d & we discussed a dose adjustment to '10mg'$  Qod going forward w/ rov in 3105mo.          Problem List:  Hx of UVEITIS (ICD-364.3) [facility], given steroid injection in 2002... s/p right cataract surg 2/09 and no active uveitis seen... ~  4/11:  she tells me she was seen 10/10 & doing well, may need left cataract surg soon. ~  [facility]  DrDickinson noted hx uveitis- steroid responder, s/p bilat cat surg w/ lens replacement, epiretinal membrane & post vitreous detachments...   R/O SARCOIDOSIS (ICD-135) - [facility]/ CTChest showing bilat hilar adenopathy, and w/u showing neg PPD, ACE=53, sed=13... prev PFT's 1/08 showed FVC 3.75 (111%), FEV1 2.74 (102%), %1sec=73, mid-flows=70%... being followed w/ serial films and "watchful waiting"... there is a family hx of sarcoidosis in one brother (severe dis w/ neuropathy). ~   we had a f/u CXR in Feb09- streaky opacity right mid lung zone, similar to 2008...  ~  CT Chest 7/08 &  6/09 showed mediastinal > hilar adenopathy some w/ calcif (generally smaller than 1/08), area of atx/ scarring right mid zone (infer RUL) & scat <1cm nodules in lower zones w/o change... ~  CXR 2/10 showed bilat hilar prominence & scarring appears the same- NAD... labs all WNL w/ ACE=56. ~  CXR 4/11 showed mild bilat hilar prominence, scarring, NAD- osteopenia & DJD in TSpine... ACE= 37. ~  CXR 4/12 showed stable bilat hilar prominence, scarring, NAD.[name]. ~  CXR 11/13 showed similar changes- irreg nodular changes in right suprahilar region, scarring, stable adenopathy, mild atherosclerotic calcif, osteopenia... ~  CXR 3/14 showed normal heart size, atherosclerotic Ao, chr scarring from underlying sarcoid, NAD.[name]. ~  CXR 1/15  showed norm heart size, areas of scarring appear unchanged, prom hilae w/o change- stable, NAD ~  PFTs 5/15 showed just some sm airways dis> FVC=2.65 (78%), FEV1=1.86 (72%); %1sec=70; mid-flows=55% predicted... After bronchodil her FEV1 improved 6%... Lung Volumes & DLCO are wnl. ~  LABS 5/15:  ACE level = 55  ~  4/16: she had nodule excised from left elbow 4/16 by DrKuzma=> granulomatous nodule c/w unusual manifewstation of Sarcoid, all spec stains were neg... ~  5/16: CXR 5/16 showed stable BHA & scarring on right, NAD, no change... LABS 5/16:  ACE=56, stable. ~  2017>  +FluA illness 09/2015 w/ ER eval & CXR showing progressive abn;  CT Chest 12/2015 confirmed progression of BHA & LUL/RML parenchymal densities felt to be c/w active Sarcoid, ACE=62;  Pt wanted to avoid Bx if poss & we decided on a course of Pred 40-30-20 Q2wk taper w/ ROV in 6wks for f/u=> ACE improved to 23 and we weaned Pred slowly... ~  12/17: Pt perceives several issues w2/ the Pred (now down to '10mg'$ /d) so we will switch to Medrol'8mg'$  tabs and continue slow weaning.[name]  PALPITATIONS >>  Hx MITRAL VALVE PROLAPSE >> on ASA '81mg'$ /d... clinical dx w/ 2DEcho 1999 showing flat closure but no frank prolapse... ~  2/16: she had a cardiac eval by DrTaylor for dyspnea> 2DEcho was neg, wnl; stress testing was neg- w/o ischemia; noted to have poor functional capacity & is way too sedentary; she had some palpit & monitor revealed PVCs & PACs- didn't want meds therefore asked to avoid caffeine 7 stress...  HYPERCHOLESTEROLEMIA (ICD-272.0) - on SIMVASTATIN '40mg'$ /d now... ~  [place] 1/08 on Lip10 showed TChol 183, TG 122, HDL 45, LDL 114...  ~  [place] 2/09 on Lip10 showed TChol 183, TG 189, HDL 39, LDL 107... continue diet & change to Simva40.  ~  FLP 5/09 on Simva40 showed TChol 185, TG 163, HDL 36, LDL 117... she wants to stay on Simva40. ~  FLP 2/10 showed TChol 176, Tg 160, HDL 39, LDL 105... rec> continue same. ~  FLP 4/11 showed TChol 160, TG 131, HDL  41, LDL 93 ~  FLP 4/12 showed TChol 174, TG 148, HDL 43, LDL 102 ~  FLP 5/13 showed TChol 159, TG 151, HDL 47, LDL 82 ~  FLP 5/14 on Simva40 showed TChol 165, TG 157, HDL 41, LDL 93 ~  FLP 11/15 on Simva40 showed TChol 169, TG 135, HDL 41, LDL 101 ~  FLP 11/16 on Simva40 showed TChol 161, TG 146, HDL 44, LDL 87  GERD (ICD-530.81) - on NEXIUM '40mg'$ /d & ZANTAC '300mg'$ Qhs... ~  EGD 6/07 w/ 3cmHH, stricture, gastitis (HPylori neg)... I offered to change her Nexium to a generic but she declined- "DrPatterson told me never to change this med"... she notes  occas nocturnal reflux symptoms. ~  5/12: She had GI eval by DrPatterson 5/12> chr GERD, hx 3cmHH, prev stricture dilated, hx colon polyps, etc> c/o incr reflux symptoms, bloating, pressure despite her Nexium40AM & Zantac300PM;  Nexium was increased to Bid, Carafate added Qid & she had EGD 5/12 showing mod gastritis w/ bx= chr inflamm, neg HPylori... ~  CTAbd 5/12 showed mult parenchymal nodules at lung bases (some fractionally larger), calcif hilar nodes bilat, no renal lesions (?left upper pole mass seen on sonar?). ~  EGD 3/13 by DrPatterson showed 3cmHH, esophagitis, prob "pill espohagitis", dilated... Symptoms resolved w/ Carafate, Nexium, Zantac, Xylocaine... ~  5/14: on Nexium40Bid, Zantac300HS, CarafateQid prn, & Zofran prn; she remains asymptomatic w/o abd pain, n/v, c/d, blood seen... ~  11/15: on Nexium40, Carafate1GmQhs, & Zofran prn; she has seen DrPyrtle & remains asymptomatic on these meds- w/o abd pain, n/v, c/d, blood seen...  IRRITABLE BOWEL SYNDROME (ICD-564.1) COLONIC POLYPS, HX OF (ICD-V12.72)  ~  Colonoscopy 6/07 by DrPatterson showing divertics & polyps (adenomatous)... f/u planned 54yr... ~  She had f/u colonoscopy 5/12 showing mod divertics, sessile polyp in cecum= serrated adenoma w/ f/u planned 3-569yr..  UTI'S, CHRONIC (ICD-599.0) - eval by DrOttelin for urology... now off the Macrodantin and using cranberry juice without  recurrent UTI, no problems...  DEGENERATIVE JOINT DISEASE (ICD-715.90) - eval by DrDalldorf w/ mod DJD in knees & hx of torn left meniscus... 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 overall improved now. ~  11/13:  TRAMADOL '50mg'$  Tid refilled for prn use... ~  2015-16[facility] right & left knee DJD, right ankle tendonitis, right plantar fasciitis...  ~  4/16: she had right elbow surg by DrKuzma> mult granulomas on path c/w sarcoid nodule...  LOW BACK PAIN, CHRONIC (ICD-724.2) ~  2/14: she fell while getting up at night & hit her back w/ severe pain & L[facility] fx- [facility] & good pain relief...  OSTEOPENIA (ICD-733.90) - on Calcium, MVI, Vit D... ~  BMD in 2005 showed TScore -1.2 [facility] Spine, [facility]  BMD 5/09 showed TScores -1.5 in Spine & -1.1 in FemNecks. ~  BMD here 5/11 showed TScores -1.2 in Spine, [facility]  5/13 & 5/14:  She is due for f/u BMD but wants to wait for now... ~  Labs 11/15 showed VitD= 52... ~  BMD 2/16 revealed Tscore -1.8 in Lspine and Rt FemNeck; we rec FOSAMAX70/wk in light of her prev compression fx=> pt stopped the Bisphos after several months due to reflux symptms and refuses alternative med... ~  She is rec to take Calcium, MVI, VitD supplement, & wt bearing exercise...  ANXIETY (ICD-300.00) >> given ALPRAZOLAM 0.'25mg'$  tabs w/ directions to try 1/2 to 1 tab Tid more regularly...  Hx of BASAL CELL SKIN CANCER - [place]  Health Maintenance: ~  GI:  followed by DrPatterson/Pyrtle w/ colon as above... ~  GYN:  followed by DrRichardson & doing well by pt's report... [facility] ~  Immunizations:  she gets yearly flu shots;  has PNEUMOVAX in 1998 & repeated 10/11 (age 89  TDAP given 5/13;  we discussed Shingles  vaccine (she had bout of shingles involving her right hip & leg ~age30) & she will decide.  Past Surgical History:  Procedure Laterality Date  . ABDOMINAL HYSTERECTOMY    . BASAL CELL CARCINOMA EXCISION    . CATARACT EXTRACTION Bilateral   . COLONOSCOPY  2012  . MASS EXCISION Left 11/02/2014   Procedure: EXCISION MASS LEFT ELBOW;  Surgeon: Daryll Brod, MD;  Location: [facility];  Service: Orthopedics;  Laterality: Left;  . POLYPECTOMY    . UPPER GASTROINTESTINAL ENDOSCOPY    . VESICOVAGINAL FISTULA CLOSURE W/ TAH     "sling" after hystrectomy per patient    Outpatient Encounter Medications as of 09/19/2017  Medication Sig  . ALPRAZolam (XANAX) 0.25 MG tablet Take 1/2 to 1 tablet by mouth three times daily (Patient taking differently: Take 1/2 to 1 tablet by mouth three times daily as needed)  . amLODipine (NORVASC) 5 MG tablet Take 2.5 mg by mouth daily.  [name] aspirin 81 MG tablet Take 81 mg by mouth daily.    . B Complex Vitamins (VITAMIN-B COMPLEX PO) Take 1 capsule by mouth daily.   . Calcium Carbonate (CALTRATE 600 PO) Take 1 tablet by mouth 2 (two) times daily.    . cholecalciferol (VITAMIN D) 1000 units tablet Take 1,000 Units by mouth daily.  . Esomeprazole Magnesium (NEXIUM 24HR) 20 MG TBEC Take 1 tablet by mouth 2 (two) times daily.  . Glucosamine HCl (GLUCOSAMINE PO) Liquid form - Take by mouth as directed once daily  . meclizine (ANTIVERT) 25 MG tablet Take 1/2-1 tablet by mouth every 4 hours as needed for dizziness  . Multiple Vitamins-Minerals (MULTIVITAL) tablet Take 1 tablet by mouth daily.    . Omega-3 Fatty Acids (FISH OIL) 1000 MG CAPS Take 1 capsule by mouth daily.    . ondansetron (ZOFRAN) 8 MG tablet 1 tablet by mouth every 6 hours as needed for nausea  . predniSONE (DELTASONE) 5 MG tablet TAKE 1 TABLET(5 MG) BY MOUTH DAILY WITH BREAKFAST  . simvastatin (ZOCOR) 20 MG tablet Take 20 mg by mouth at bedtime.  . sucralfate (CARAFATE) 1 g tablet Take 1  tablet (1 g total) by mouth 2 (two) times daily.    Allergies  Allergen Reactions  . 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 Amoxicillin-Pot Clavulanate     REACTION: causes diarrhea  . Codeine     REACTION: nausea  . Levaquin [Levofloxacin In D5w]     Had itching w/ intravenous, not sure if reaction was from abx or the need to change the IV.  Does well when taken with Benadryl. Pt states can take orally with no issues   . Tdap [Diphth-Acell Pertussis-Tetanus]     Area raised, warm to touch, tender, swollen, pt felt jittery, nausea and some SOB  . Tape Rash    Blisters skin and removes skin --NEEDS PAPER TAPE    Immunization History  Administered Date(s) Administered  . Influenza Split 05/23/2011, 05/23/2012, 05/04/2013, 05/31/2014  . Influenza Whole 09/11/2009, 05/08/2010  . Influenza, High Dose Seasonal PF 05/02/2016, 05/08/2017  . Influenza-Unspecified 05/25/2015  . Pneumococcal Polysaccharide-23 05/17/2010  . Tdap 11/21/2011   Current Medications, Allergies, Past Medical History, Past Surgical History, Family History, and Social History were reviewed in [employer] record.   Review of Systems         See HPI - all other systems neg except as noted... The patient denies anorexia, fever, weight loss, weight gain,  vision loss, decreased hearing, hoarseness, chest pain, syncope, dyspnea on exertion, peripheral edema, prolonged cough, headaches, hemoptysis, abdominal pain, melena, hematochezia, severe indigestion/heartburn, hematuria, incontinence, muscle weakness, suspicious skin lesions, transient blindness, difficulty walking, depression, unusual weight change, abnormal bleeding, enlarged lymph nodes, and angioedema.     Objective:   Physical Exam    WD, WN, 77 y/o WF in NAD... GENERAL:  Alert & oriented; pleasant & cooperative... HEENT:  [place]/AT, EOM-wnl, PERRLA,  EACs-clear, TMs-wnl, NOSE-clear, THROAT-clear & wnl. NECK:  Supple w/ fairROM; no JVD; normal carotid impulses w/o bruits; no thyromegaly or nodules palpated; no lymphadenopathy. CHEST:  Clear to P & A; without wheezes/ rales/ or rhonchi. HEART:  Regular Rhythm; without murmurs/ rubs/ or gallops. ABDOMEN:  Soft & nontender; normal bowel sounds; no organomegaly or masses detected. EXT: without deformities, mild arthritic changes; no varicose veins/ +venous insuffic/ no edema. NEURO:  CN's intact;  no focal neuro deficits... DERM:  No lesions noted; no rash etc...  RADIOLOGY DATA:  Reviewed in the EPIC EMR & discussed w/ the patient...  LABORATORY DATA:  Reviewed in the EPIC EMR & discussed w/ the patient...   Assessment & Plan:    DYSPNEA w/ thorough PULM & CARDIAC eval>> underlying sarcoidosis w/ mild pulm fibrosis & prev no signs of dis activity- on watchful waiting protocol; Cards eval by DrTaylor was neg as well x PVCs & PACs; she has improved on a gradual exercise program, avoids caffeine & stress...  SARCOID>  Previous CXR/ PFT/ ACE stable & she remains asymptomatic, nodule left elbow excised & path revealed nonnecrotizing granulomata... ~  12/2015>  FluA illness 09/2015 w/ CXR via ER showed some progression of the BHA & parenchymal abnormalities; CT Chest 12/2015 confirmed & ACE=62; We decided to start Rx w/ PRED 40-30-20 Q2wk taper w/ ROV/ in 6wks. ~  02/2016>  Clinically ?sl better w/ a few Pred side effects, ACE improved to 23 & we decided to cut the Pred to 20-10 Qod til ret 6wks... ~  03/2016>  CXR shows sl improvement on the Pred 20-10Qod;  We decided to continue this dose thru Oct & decr to '10mg'$ /d starting in Nov w/ ROV in 1mo ~  06/2016>  LDanayeperceives some difficulty w/ the Pred but she is improved overall & ACE down to 28; we decided to change to MEDROL '8mg'$  tabs taking one Qam for Dec & cut to '8mg'$  alternating w/ '4mg'$  QOD starting Jan2018=> we will recheck her in Feb w/ f/u CTChest  due and also due for 89yrecheck BMD ~  11/27/16>   We discussed decreasing her Pred from 10-5 Qod to '5mg'$ /d w/ routine rov in 30m1030mo 03/07/17>   With the incr in ACE to 53- we decided to continue the Pred '5mg'$  daily; she is asked to maintain her exercise program etc; she will call prn any resp problems and we plan ROV recheck in 3-30mo70mo~  06/20/17>   LoisShelbylynn an upper resp infection on top of her chronic sarcoidosis- on Pred '5mg'$ /d for the last 34mo 9moo & her CXR is stable and inflamm markers are neg + ACE=42;  We discussed OTC rx w/ Antihist & Flonase + Rx w/ ZPak;  We decided to keep the Pred '5mg'$ /d for her sarcoidosis in light of her CXR changes (likely scarring now) and ACE 42. ~  09/19/17>   Leilani Lucita Ferraraars stable from the pulm/ sarcoid standpoint on the low dose Pred '5mg'$ /d & we discussed a dose adjustment to '10mg'$   Qod going forward w/ rov in 35mo HBP>  DrHunter added Amlodipine 7 BP is back to normal...  CHOL>  Stable on the Simva40 + diet...  GERD>  On Nexium & Carafate, she is currently improved from her prev symptoms...  DJD/ LBP/ Osteopenia>  As noted she remains stable on current meds & exercise program; BMD 2/16 w/ Tscore -1.8 & rec to start FOSAMAX70=> she didn't stick w/ it & was eval by endocrine DrEllison w/ rec for PROLIA- Q651mo.  Compression fx L1 after fall> she is s/p L1 kyphoplasty...  Other medical issues as noted...   Patient's Medications  New Prescriptions   No medications on file  Previous Medications   ALPRAZOLAM (XANAX) 0.25 MG TABLET    Take 1/2 to 1 tablet by mouth three times daily   AMLODIPINE (NORVASC) 5 MG TABLET    Take 2.5 mg by mouth daily.   ASPIRIN 81 MG TABLET    Take 81 mg by mouth daily.     B COMPLEX VITAMINS (VITAMIN-B COMPLEX PO)    Take 1 capsule by mouth daily.    CALCIUM CARBONATE (CALTRATE 600 PO)    Take 1 tablet by mouth 2 (two) times daily.     CHOLECALCIFEROL (VITAMIN D) 1000 UNITS TABLET    Take 1,000 Units by mouth daily.   ESOMEPRAZOLE  MAGNESIUM (NEXIUM 24HR) 20 MG TBEC    Take 1 tablet by mouth 2 (two) times daily.   GLUCOSAMINE HCL (GLUCOSAMINE PO)    Liquid form - Take by mouth as directed once daily   MECLIZINE (ANTIVERT) 25 MG TABLET    Take 1/2-1 tablet by mouth every 4 hours as needed for dizziness   MULTIPLE VITAMINS-MINERALS (MULTIVITAL) TABLET    Take 1 tablet by mouth daily.     OMEGA-3 FATTY ACIDS (FISH OIL) 1000 MG CAPS    Take 1 capsule by mouth daily.     ONDANSETRON (ZOFRAN) 8 MG TABLET    1 tablet by mouth every 6 hours as needed for nausea   PREDNISONE (DELTASONE) 5 MG TABLET    TAKE 2 TABLET(10 MG) BY MOUTH EVERY OTHER DAY in AM...   SIMVASTATIN (ZOCOR) 20 MG TABLET    Take 20 mg by mouth at bedtime.   SUCRALFATE (CARAFATE) 1 G TABLET    Take 1 tablet (1 g total) by mouth 2 (two) times daily.  Modified Medications   No medications on file  Discontinued Medications   SIMVASTATIN (ZOCOR) 40 MG TABLET    Take 1 tablet (40 mg total) by mouth at bedtime.

## 2017-09-19 NOTE — Patient Instructions (Signed)
Today we updated your med list in our EPIC system...     We decided to adjust your PREDNISONE 5mg  tabs to 2tabs (10mg ) every other day...    (that will be 10-0-10-0-10-0 etc)...  Keep up the good work w/ your exercise program!!!  Call for any questions...  Let's plan a follow up visit in 3-40mo, sooner if needed for problems...  Love to [place], Jeneen Rinks, and the "kids"...

## 2017-10-02 ENCOUNTER — Encounter: Payer: Self-pay | Admitting: Physician Assistant

## 2017-10-02 ENCOUNTER — Ambulatory Visit (INDEPENDENT_AMBULATORY_CARE_PROVIDER_SITE_OTHER): Payer: Medicare Other | Admitting: Physician Assistant

## 2017-10-02 VITALS — BP 142/88 | HR 88 | Temp 98.6°F | Ht 68.0 in | Wt 178.0 lb

## 2017-10-02 DIAGNOSIS — H9202 Otalgia, left ear: Secondary | ICD-10-CM | POA: Diagnosis not present

## 2017-10-02 DIAGNOSIS — R300 Dysuria: Secondary | ICD-10-CM

## 2017-10-02 LAB — POCT URINALYSIS DIPSTICK
Bilirubin, UA: NEGATIVE
Blood, UA: NEGATIVE
Glucose, UA: NEGATIVE
KETONES UA: NEGATIVE
NITRITE UA: POSITIVE
PH UA: 6 (ref 5.0–8.0)
PROTEIN UA: NEGATIVE
SPEC GRAV UA: 1.015 (ref 1.010–1.025)
UROBILINOGEN UA: 0.2 U/dL

## 2017-10-02 MED ORDER — OFLOXACIN 0.3 % OT SOLN: 5.0000 [drp] | Freq: Every day | OTIC | 0 refills | Status: DC

## 2017-10-02 MED ORDER — CIPROFLOXACIN HCL 250 MG PO TABS: 250.0000 mg | ORAL_TABLET | Freq: Two times a day (BID) | ORAL | 0 refills | Status: DC

## 2017-10-02 NOTE — Patient Instructions (Signed)
It was great to see you!  Start the cipro for your UTI. If symptoms worsen or persist, please let us know.  Use the ear drops to help with your ear pain, follow-up if symptoms worsen or persist.

## 2017-10-02 NOTE — Progress Notes (Signed)
Laura [name] is a 77 y.o. female here for a new problem.  I acted as a Education administrator for [employer], Laura [name] Anselmo Pickler, LPN  History of Present Illness:   Chief Complaint  Patient presents with  . Dysuria    Started Monday 09/30/17  . Back Pain    aching pain  . Pelvic Pressure    Dysuria   This is a new problem. Episode onset: Started on Monday. The problem has been gradually worsening. The quality of the pain is described as aching and shooting. The pain is at a severity of 3/10. The pain is mild. There has been no fever. She is sexually active. There is no history of pyelonephritis. Associated symptoms include frequency. Pertinent negatives include no chills, flank pain, hematuria, nausea or vomiting. Associated symptoms comments: Low pelvic pressure and low back discomfort. She has tried increased fluids and acetaminophen for the symptoms. The treatment provided no relief. Her past medical history is significant for recurrent UTIs.   L ear pain Patient reports that she was cleaning her ear last night and when she was using a Q-tip she noted that her ear was bleeding. It stopped soon after cleaning. She denies any significant pain, discharge that resembles pus, fever, decrease in hearing. She has not tried anything for this.   Past Medical History:  Diagnosis Date  . Adenomatous colon polyp    sessile  . Allergy    SEASONAL  . Anxiety   . Cataract    bilateral  . Colon polyps   . Diverticulosis of colon (without mention of hemorrhage)   . DJD (degenerative joint disease)   . Dysrhythmia    pac  . Esophagitis   . Essential hypertension 06/04/2017  . GERD (gastroesophageal reflux disease)   . Hiatal hernia   . Hypercholesterolemia   . IBS (irritable bowel syndrome)   . MVP (mitral valve prolapse)   . Osteopenia   . Personal history of colonic polyps    SESSILE SERRATED ADENOMAS (X2)  . PONV (postoperative nausea and vomiting)   . Sarcoidosis   . Shortness of  breath dyspnea   . Stricture and stenosis of esophagus   . Unspecified gastritis and gastroduodenitis without mention of hemorrhage   . Urinary tract infection, site not specified   . Uveitis      Social History   Socioeconomic History  . Marital status: Married    Spouse name: Florentina Marquart  . Number of children: 2  . Years of education: Not on file  . Highest education level: Not on file  Social Needs  . Financial resource strain: Not on file  . Food insecurity - worry: Not on file  . Food insecurity - inability: Not on file  . Transportation needs - medical: Not on file  . Transportation needs - non-medical: Not on file  Occupational History  . Occupation: Retired    Comment: & Part Time Artist  Tobacco Use  . Smoking status: Never Smoker  . Smokeless tobacco: Never Used  Substance and Sexual Activity  . Alcohol use: Yes    Alcohol/week: 0.6 oz    Types: 1 Glasses of wine per week    Comment: occ wine   . Drug use: No  . Sexual activity: Not on file  Other Topics Concern  . Not on file  Social History Narrative   Married- husband with parkinsons. Will be patient of Dr. Yong Channel   Needs time to care for husband  Retired Artist (until 2017), bookkeeping prior      Hobbies: cooking, florist work, Barrister's clerk    Past Surgical History:  Procedure Laterality Date  . ABDOMINAL HYSTERECTOMY    . BASAL CELL CARCINOMA EXCISION    . CATARACT EXTRACTION Bilateral   . COLONOSCOPY  2012  . MASS EXCISION Left 11/02/2014   Procedure: EXCISION MASS LEFT ELBOW;  Surgeon: Daryll Brod, MD;  Location: [facility];  Service: Orthopedics;  Laterality: Left;  . POLYPECTOMY    . UPPER GASTROINTESTINAL ENDOSCOPY    . VESICOVAGINAL FISTULA CLOSURE W/ TAH     "sling" after hystrectomy per patient    Family History  Problem Relation Age of Onset  . Melanoma Mother        per pt melanoma   . Colon cancer Sister        sees Dr Henrene Pastor   . Colon polyps  Brother   . Sarcoidosis Brother   . Colon polyps Sister        Dr stark   . Colon polyps Sister   . Endometrial cancer Sister   . Lung cancer Brother   . Other Unknown        nephew with Wegener's   . Stroke Sister   . Osteoporosis Neg Hx     Allergies  Allergen Reactions  . 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 Amoxicillin-Pot Clavulanate     REACTION: causes diarrhea  . Codeine     REACTION: nausea  . Levaquin [Levofloxacin In D5w]     Had itching w/ intravenous, not sure if reaction was from abx or the need to change the IV.  Does well when taken with Benadryl. Pt states can take orally with no issues   . Tdap [Diphth-Acell Pertussis-Tetanus]     Area raised, warm to touch, tender, swollen, pt felt jittery, nausea and some SOB  . Tape Rash    Blisters skin and removes skin --NEEDS PAPER TAPE    Current Medications:   Current Outpatient Medications:  .  ALPRAZolam (XANAX) 0.25 MG tablet, Take 1/2 to 1 tablet by mouth three times daily (Patient taking differently: Take 1/2 to 1 tablet by mouth three times daily as needed), Disp: 90 tablet, Rfl: 5 .  amLODipine (NORVASC) 5 MG tablet, Take 2.5 mg by mouth daily., Disp: , Rfl: 5 .  aspirin 81 MG tablet, Take 81 mg by mouth daily.  , Disp: , Rfl:  .  B Complex Vitamins (VITAMIN-B COMPLEX PO), Take 1 capsule by mouth daily. , Disp: , Rfl:  .  Calcium Carbonate (CALTRATE 600 PO), Take 1 tablet by mouth 2 (two) times daily.  , Disp: , Rfl:  .  cholecalciferol (VITAMIN D) 1000 units tablet, Take 1,000 Units by mouth daily., Disp: , Rfl:  .  Esomeprazole Magnesium (NEXIUM 24HR) 20 MG TBEC, Take 1 tablet by mouth 2 (two) times daily., Disp: , Rfl:  .  Glucosamine HCl (GLUCOSAMINE PO), Liquid form - Take by mouth as directed once daily, Disp: , Rfl:  .  meclizine (ANTIVERT) 25 MG tablet, Take 1/2-1 tablet by mouth every 4 hours as needed for  dizziness, Disp: , Rfl:  .  Multiple Vitamins-Minerals (MULTIVITAL) tablet, Take 1 tablet by mouth daily.  , Disp: , Rfl:  .  Omega-3 Fatty Acids (FISH OIL) 1000 MG CAPS, Take 1 capsule  by mouth daily.  , Disp: , Rfl:  .  ondansetron (ZOFRAN) 8 MG tablet, 1 tablet by mouth every 6 hours as needed for nausea, Disp: 25 tablet, Rfl: 5 .  predniSONE (DELTASONE) 5 MG tablet, TAKE 1 TABLET(5 MG) BY MOUTH DAILY WITH BREAKFAST (Patient taking differently: TAKE 2 (5mg ) TABLETS FOR 10mg  EVERY OTHER DAY BY MOUTH DAILY WITH BREAKFAST), Disp: 90 tablet, Rfl: 0 .  simvastatin (ZOCOR) 20 MG tablet, Take 20 mg by mouth at bedtime., Disp: , Rfl:  .  sucralfate (CARAFATE) 1 g tablet, Take 1 tablet (1 g total) by mouth 2 (two) times daily. (Patient taking differently: Take 1 g by mouth as needed. ), Disp: 60 tablet, Rfl: 2 .  ciprofloxacin (CIPRO) 250 MG tablet, Take 1 tablet (250 mg total) by mouth 2 (two) times daily., Disp: 14 tablet, Rfl: 0 .  ofloxacin (FLOXIN) 0.3 % OTIC solution, Place 5 drops into the left ear daily., Disp: 5 mL, Rfl: 0   Review of Systems:   Review of Systems  Constitutional: Negative for chills.  Gastrointestinal: Negative for nausea and vomiting.  Genitourinary: Positive for dysuria and frequency. Negative for flank pain and hematuria.    Vitals:   Vitals:   10/02/17 1147  BP: (!) 142/88  Pulse: 88  Temp: 98.6 F (37 C)  TempSrc: Oral  SpO2: 96%  Weight: 178 lb (80.7 kg)  Height: 5\' 8"  (1.727 m)     Body mass index is 27.06 kg/m.  Physical Exam:   Physical Exam  Constitutional: She appears well-developed. She is cooperative.  Non-toxic appearance. She does not have a sickly appearance. She does not appear ill. No distress.  HENT:  Right Ear: Tympanic membrane, external ear and ear canal normal. Tympanic membrane is not injected, not scarred, not perforated, not erythematous and not retracted.  Left Ear: Tympanic membrane normal. Left ear exhibits lacerations (to L ear  canal). Tympanic membrane is not injected, not scarred, not perforated, not erythematous and not retracted.  Dried blood present to left ear canal  Cardiovascular: Normal rate, regular rhythm, S1 normal, S2 normal, normal heart sounds and normal pulses.  No LE edema  Pulmonary/Chest: Effort normal and breath sounds normal.  Abdominal: Normal appearance. There is no CVA tenderness.  Neurological: She is alert. GCS eye subscore is 4. GCS verbal subscore is 5. GCS motor subscore is 6.  Skin: Skin is warm, dry and intact.  Psychiatric: She has a normal mood and affect. Her speech is normal and behavior is normal.  Nursing note and vitals reviewed.   Results for orders placed or performed in visit on 10/02/17  POCT urinalysis dipstick  Result Value Ref Range   Color, UA yellow    Clarity, UA clear    Glucose, UA negative    Bilirubin, UA negative    Ketones, UA negative    Spec Grav, UA 1.015 1.010 - 1.025   Blood, UA negative    pH, UA 6.0 5.0 - 8.0   Protein, UA negative    Urobilinogen, UA 0.2 0.2 or 1.0 E.U./dL   Nitrite, UA positive    Leukocytes, UA Small (1+) (A) Negative   Appearance     Odor       Assessment and Plan:    Ailie was seen today for dysuria, back pain and pelvic pressure.  Diagnoses and all orders for this visit:  Dysuria No red flags on my exam. UA is consistent with cystitis. Initiate Cipro, she has tolerated this  well in the past. I will also obtain a urine culture today. Reviewed red flags and return precautions with patient. -     POCT urinalysis dipstick -     Urine Culture  Left ear pain No red flags on my exam. In order to prevent secondary infection I have prescribed her ofloxacin drops. I recommended that she stop using Q-tips. Follow-up if symptoms worsen or persist. Patient verbalized understanding.  Other orders -     ofloxacin (FLOXIN) 0.3 % OTIC solution; Place 5 drops into the left ear daily. -     ciprofloxacin (CIPRO) 250 MG tablet;  Take 1 tablet (250 mg total) by mouth 2 (two) times daily.    . Reviewed expectations re: course of current medical issues. . Discussed self-management of symptoms. . Outlined signs and symptoms indicating need for more acute intervention. . Patient verbalized understanding and all questions were answered. . See orders for this visit as documented in the electronic medical record. . Patient received an After-Visit Summary.  CMA or LPN served as scribe during this visit. History, Physical, and Plan performed by medical provider. Documentation and orders reviewed and attested to.  Laura [name], Laura [name]

## 2017-10-03 ENCOUNTER — Encounter: Payer: Self-pay | Admitting: Physician Assistant

## 2017-10-04 ENCOUNTER — Telehealth: Payer: Self-pay | Admitting: Family Medicine

## 2017-10-04 LAB — URINE CULTURE
MICRO NUMBER:: 90320241
SPECIMEN QUALITY: ADEQUATE

## 2017-10-04 NOTE — Telephone Encounter (Signed)
Copied from [place] [id number]. Topic: Quick Communication - Lab Results >> Oct 04, 2017  4:42 PM Marian Sorrow, LPN wrote: Called patient to inform them of 3/13 lab results. When patient returns call, triage nurse may disclose results.

## 2017-10-04 NOTE — Telephone Encounter (Signed)
Pt given lab results and documented in result note.  

## 2017-11-05 ENCOUNTER — Encounter: Payer: Self-pay | Admitting: Family Medicine

## 2017-11-05 ENCOUNTER — Ambulatory Visit (INDEPENDENT_AMBULATORY_CARE_PROVIDER_SITE_OTHER): Payer: Medicare Other | Admitting: Family Medicine

## 2017-11-05 DIAGNOSIS — I10 Essential (primary) hypertension: Secondary | ICD-10-CM | POA: Diagnosis not present

## 2017-11-05 DIAGNOSIS — E785 Hyperlipidemia, unspecified: Secondary | ICD-10-CM | POA: Diagnosis not present

## 2017-11-05 NOTE — Assessment & Plan Note (Signed)
S: reasonably controlled on simvastatin 20mg  with LDL 91 in October A/P: continue current rx

## 2017-11-05 NOTE — Progress Notes (Signed)
Subjective:  Laura [name] is a 77 y.o. year old very pleasant female patient who presents for/with See problem oriented charting ROS- stable shortness of breath. No chest pain. No edema. No blurry vision. Stable headache pattern   Past Medical History-  Patient Active Problem List   Diagnosis Date Noted  . Steroid-induced hyperglycemia 10/18/2016    Priority: High  . Sarcoidosis 11/21/2011    Priority: High  . Essential hypertension 06/04/2017    Priority: Medium  . Hyperlipidemia 10/18/2016    Priority: Medium  . Osteoporosis 12/06/2014    Priority: Medium  . Heart palpitations 07/26/2014    Priority: Medium  . Dyspnea 08/20/2013    Priority: Medium  . GERD with stricture 10/12/2011    Priority: Medium  . Uveitis 09/21/2007    Priority: Medium  . Recurrent UTI 09/09/2007    Priority: Medium  . Anxiety state 08/06/2007    Priority: Medium  . History of colonic polyps 08/06/2007    Priority: Medium  . History of basal cell cancer 10/18/2016    Priority: Low  . Compression fx, lumbar spine ([place]) 09/24/2012    Priority: Low  . Pneumonia 09/24/2012    Priority: Low  . Osteoarthritis 09/21/2007    Priority: Low  . IBS (irritable bowel syndrome) 08/06/2007    Priority: Low  . LOW BACK PAIN, CHRONIC 08/06/2007    Priority: Low    Medications- reviewed and updated Current Outpatient Medications  Medication Sig Dispense Refill  . amLODipine (NORVASC) 5 MG tablet Take 2.5 mg by mouth daily.  5  . B Complex Vitamins (VITAMIN-B COMPLEX PO) Take 1 capsule by mouth daily.     . Calcium Carbonate (CALTRATE 600 PO) Take 1 tablet by mouth 2 (two) times daily.      . cholecalciferol (VITAMIN D) 1000 units tablet Take 1,000 Units by mouth daily.    . Esomeprazole Magnesium (NEXIUM 24HR) 20 MG TBEC Take 1 tablet by mouth 2 (two) times daily.    . Glucosamine HCl (GLUCOSAMINE PO) Liquid form - Take by mouth as directed once daily    . meclizine (ANTIVERT) 25 MG tablet Take  1/2-1 tablet by mouth every 4 hours as needed for dizziness    . Multiple Vitamins-Minerals (MULTIVITAL) tablet Take 1 tablet by mouth daily.      . Omega-3 Fatty Acids (FISH OIL) 1000 MG CAPS Take 1 capsule by mouth daily.      . ondansetron (ZOFRAN) 8 MG tablet 1 tablet by mouth every 6 hours as needed for nausea 25 tablet 5  . simvastatin (ZOCOR) 20 MG tablet Take 20 mg by mouth at bedtime.    . sucralfate (CARAFATE) 1 g tablet Take 1 tablet (1 g total) by mouth 2 (two) times daily. (Patient taking differently: Take 1 g by mouth as needed. ) 60 tablet 2   No current facility-administered medications for this visit.     Objective: BP 112/86 (BP Location: Right Arm, Patient Position: Sitting, Cuff Size: Normal)   Pulse 80   Temp 98.3 F (36.8 C) (Oral)   Ht 5\' 8"  (1.727 m)   Wt 177 lb (80.3 kg)   SpO2 97%   BMI 26.91 kg/m  Gen: NAD, resting comfortably CV: RRR no murmurs rubs or gallops Lungs: CTAB no crackles, wheeze, rhonchi Abdomen: soft/nontender/nondistended/normal bowel sounds. No rebound or guarding.  Ext: no edema Skin: warm, dry Neuro: grossly normal, moves all extremities  Assessment/Plan:  Other notes She wants to remain off prevnar until  she is off of prednisone- we will try next visit  Prednisone could slightly raise BP as well so will monitor when she comes off  Essential hypertension S: controlled on amlodipine 2.5mg . Home cuff about 10 points higher than our readings- she bought a new one and blood pressures have been around 130 at home and controlled diastolic as well. Rarely into 140s.  BP Readings from Last 3 Encounters:  11/05/17 112/86  10/02/17 (!) 142/88  09/19/17 118/68  A/P: We discussed blood pressure goal of <140/90. Continue current meds  Hyperlipidemia S: reasonably controlled on simvastatin 20mg  with LDL 91 in October A/P: continue current rx   Future Appointments  Date Time Provider [place]  11/18/2017  2:00 PM Renato Shin,  MD [facility] None  12/17/2017 11:00 AM Noralee Space, MD [facility] None   Return in about 6 months (around 05/07/2018) for physical.  Return precautions advised.  Garret Reddish, MD

## 2017-11-05 NOTE — Assessment & Plan Note (Signed)
S: controlled on amlodipine 2.5mg . Home cuff about 10 points higher than our readings- she bought a new one and blood pressures have been around 130 at home and controlled diastolic as well. Rarely into 140s.  BP Readings from Last 3 Encounters:  11/05/17 112/86  10/02/17 (!) 142/88  09/19/17 118/68  A/P: We discussed blood pressure goal of <140/90. Continue current meds

## 2017-11-05 NOTE — Patient Instructions (Addendum)
No changes today as long as blood pressure remains <140/90 for the most part

## 2017-11-15 ENCOUNTER — Encounter: Payer: Self-pay | Admitting: Family Medicine

## 2017-11-15 ENCOUNTER — Ambulatory Visit (INDEPENDENT_AMBULATORY_CARE_PROVIDER_SITE_OTHER): Payer: Medicare Other | Admitting: Family Medicine

## 2017-11-15 ENCOUNTER — Ambulatory Visit: Payer: Self-pay | Admitting: *Deleted

## 2017-11-15 VITALS — BP 122/78 | HR 79 | Temp 97.9°F | Ht 65.0 in | Wt 172.0 lb

## 2017-11-15 DIAGNOSIS — R42 Dizziness and giddiness: Secondary | ICD-10-CM

## 2017-11-15 DIAGNOSIS — R002 Palpitations: Secondary | ICD-10-CM | POA: Diagnosis not present

## 2017-11-15 DIAGNOSIS — H6983 Other specified disorders of Eustachian tube, bilateral: Secondary | ICD-10-CM | POA: Diagnosis not present

## 2017-11-15 LAB — TSH: TSH: 1.33 u[IU]/mL (ref 0.35–4.50)

## 2017-11-15 LAB — BASIC METABOLIC PANEL
BUN: 14 mg/dL (ref 6–23)
CHLORIDE: 103 meq/L (ref 96–112)
CO2: 29 meq/L (ref 19–32)
Calcium: 9.9 mg/dL (ref 8.4–10.5)
Creatinine, Ser: 0.79 mg/dL (ref 0.40–1.20)
GFR: 74.95 mL/min (ref >60.00)
Glucose, Bld: 110 mg/dL — ABNORMAL HIGH (ref 70–99)
POTASSIUM: 4.2 meq/L (ref 3.5–5.1)
Sodium: 141 mEq/L (ref 135–145)

## 2017-11-15 LAB — CBC
HCT: 47.5 % — ABNORMAL HIGH (ref 36.0–46.0)
Hemoglobin: 16 g/dL — ABNORMAL HIGH (ref 12.0–15.0)
MCHC: 33.7 g/dL (ref 30.0–36.0)
MCV: 92.1 fl (ref 78.0–100.0)
PLATELETS: 298 10*3/uL (ref 150.0–400.0)
RBC: 5.15 Mil/uL — ABNORMAL HIGH (ref 3.87–5.11)
RDW: 13.3 % (ref 11.5–15.5)
WBC: 10.3 10*3/uL (ref 4.0–10.5)

## 2017-11-15 NOTE — Progress Notes (Signed)
    Subjective:  Laura [name] is a 77 y.o. female who presents today for same-day appointment with a chief complaint of palpitations.   HPI:  Palpitations, acute issue Patient with several year history of palpitations and heart fluttering.  Symptoms significantly worsened about a week ago, however have been improving over last day or 2..  She has also had some associated shortness of breath with the palpitations.  Initially had one episode of vertigo while rolling over she has had this a couple times in the past.  She took meclizine which resolved her vertigo symptoms.  She is still having some occasional fluttering.  No chest pain.  She has been under quite a bit of stress recently due to her husband's medical problems, which include Parkinson's disease.  No other obvious precipitating events.  No other obvious alleviating or aggravating factors.  Patient has a strong family history of atrial fibrillation, and she is worried that she may have developed this.  ROS: Per HPI  PMH: She reports that she has never smoked. She has never used smokeless tobacco. She reports that she drinks about 0.6 oz of alcohol per week. She reports that she does not use drugs.  Objective:  Physical Exam: BP 122/78 (BP Location: Left Arm)   Pulse 79   Temp 97.9 F (36.6 C)   Ht 5\' 5"  (1.651 m)   Wt 172 lb (78 kg)   SpO2 97%   BMI 28.62 kg/m   Gen: NAD, resting comfortably HEENT: TMs with clear effusion bilaterally.  Oropharynx clear.  Nasal mucosa boggy bilaterally. CV: irregular with no murmurs appreciated Pulm: NWOB, CTAB with no crackles, wheezes, or rhonchi GI: Normal bowel sounds present. Soft, Nontender, Nondistended. MSK: No edema, cyanosis, or clubbing noted Skin: Warm, dry Neuro: Grossly normal, moves all extremities Psych: Normal affect and thought content  EKG: Normal sinus rhythm.  One PVC noted.  No ischemic changes.  Assessment/Plan:  Palpitations Likely secondary to PVCs.   Likely worsened recently due to increased stress related to her husband's medical condition.  Her EKG has 1 PVC without any other ischemic changes.  Overall, her EKG is stable since her previous.  We will check CBC, CMET, and TSH to rule out other causes for her palpitations.  Given that her symptoms are improving, her benign EKG findings, and lack of current chest pain or shortness of breath, do not think she needs further work-up at this point.  Encouraged patient to stay well-hydrated, avoid caffeine, and work on stress management.  Discussed reasons to return to care including development of chest pain, shortness of breath, or worsening palpitations.  Would consider Holter monitor and/referral to cardiology if continues to have symptomatic palpitations.    Vertigo Stable.  Her neurological exam is normal.  She does not have any red flag signs or symptoms.  She is not having any current symptoms.  May be exacerbated by her eustachian tube dysfunction-please see below problem.  We will treat that as outlined below.  Also advised patient to use meclizine as needed.  Discussed return precautions and reasons to seek emergent care over the weekend.  Eustachian tube dysfunction Likely secondary to seasonal allergies.  Recommended use of over-the-counter Flonase over the next week to see if this helps with her symptoms.  [name]. [name], MD 11/15/2017 11:37 AM

## 2017-11-15 NOTE — Telephone Encounter (Signed)
Patient is being seen in the office now. 

## 2017-11-15 NOTE — Progress Notes (Signed)
Please let patient know that all of her blood work is stable.  There are no findings to explain why her palpitations are occurring more frequently-this is good news.  She should continue with the treatment plan we discussed at her visit.  I would like for her to follow-up with me or Dr. Yong Channel if her symptoms do not continue to improve over the next 1 to 2 weeks, or if her symptoms worsen.  [name]. [name], MD 11/15/2017 4:48 PM

## 2017-11-15 NOTE — Patient Instructions (Signed)
It was very nice to see you today!  You do not have atrial fibrillation.   We will check blood work to make sure there is nothing else going on.   Please try using flonase daily for the next week.  Let me or Dr Yong Channel know if your symptoms worsen or do not improve over the next couple of weeks.  Take care, Dr [name]

## 2017-11-15 NOTE — Telephone Encounter (Signed)
Pt reports heart "fluttering." States H/O palpitations 2-3 years ago,"never found anything." Reports occasional episodes of palpitations over years, now worsening, more frequent over past week. States occur several times an hour, duration varies.Does report worse with exertion. HR during call 84, pt states "feels regular." Pt reports B/P at 0730 128/73. H/O MVP. Denies CP,tightness. Reports episode of vertigo yesterday am before getting OOB. States"rolled over and room was spinning." Pt has h/o vertigo, took her Antivert which was effective.  Also reports fatigue, poor endurance, "no energy." Has periods of SOB however pt states not unusual; pt with h/o sarcoidosis. Brief pds of nausea past 2 days, occasional "ringing in ears." Can feel heart "pounding at times."  Call placed to practice re: assessment, spoke with Juliann Pulse. Secured Same Day appt with Dr. [name]. Care advise given per protocol.  Reason for Disposition . [1] Skipped or extra beat(s) AND [2] occurs 4 or more times per minute  Answer Assessment - Initial Assessment Questions 1. DESCRIPTION: "Please describe your heart rate or heart beat that you are having" (e.g., fast/slow, regular/irregular, skipped or extra beats, "palpitations")     Palpitations 2. ONSET: "When did it start?" (Minutes, hours or days)      H/O of "fluttering" for 2-3 years, worsening x 1 week. 3. DURATION: "How long does it last" (e.g., seconds, minutes, hours)     Sometimes constant, hard to tell, varies 4. PATTERN "Does it come and go, or has it been constant since it started?"  "Does it get worse with exertion?"   "Are you feeling it now?"     Yes, worse with exertion 5. TAP: "Using your hand, can you tap out what you are feeling on a chair or table in front of you, so that I can hear?" (Note: not all patients can do this)        6. HEART RATE: "Can you tell me your heart rate?" "How many beats in 15 seconds?"  (Note: not all patients can do this)       84, "Feels  regular." 7. RECURRENT SYMPTOM: "Have you ever had this before?" If so, ask: "When was the last time?" and "What happened that time?"      Yes, years ago."did not find a reason." 8. CAUSE: "What do you think is causing the palpitations?"     unsure 9. CARDIAC HISTORY: "Do you have any history of heart disease?" (e.g., heart attack, angina, bypass surgery, angioplasty, arrhythmia)      MVP 10. OTHER SYMPTOMS: "Do you have any other symptoms?" (e.g., dizziness, chest pain, sweating, difficulty breathing)       Vertigo yesterday morning before getting OOB, "Rolled over." fatigue, lack of energy, poor endurance, mild nausea, also feels heart "pounding" at times.  Protocols used: HEART RATE AND HEARTBEAT QUESTIONS-A-AH

## 2017-11-18 ENCOUNTER — Ambulatory Visit: Payer: Medicare Other | Admitting: Endocrinology

## 2017-12-12 ENCOUNTER — Ambulatory Visit: Payer: Medicare Other | Admitting: *Deleted

## 2017-12-12 ENCOUNTER — Encounter: Payer: Self-pay | Admitting: *Deleted

## 2017-12-12 VITALS — BP 130/70 | HR 72 | Ht 68.0 in | Wt 174.0 lb

## 2017-12-12 DIAGNOSIS — Z0000 Encounter for general adult medical examination without abnormal findings: Secondary | ICD-10-CM

## 2017-12-12 NOTE — Patient Instructions (Addendum)
[name] , Thank you for taking time to come for your Medicare Wellness Visit. I appreciate your ongoing commitment to your health goals. Please review the following plan we discussed and let me know if I can assist you in the future.   Schedule or ask Dr. Richardson about your mammogram  Please ask [employer] who is in network ? Retinal specialist or other Or ask Dr. Nadel    Dr. Nadel is holding off on your last pneumonia vaccine PREVNAR 13 Until you are off the prednisone   Does not recommend she take the shingrix until she gets off prednisone Shingrix is a vaccine for the prevention of Shingles in Adults 50 and older.  If you are on Medicare, the shingrix is covered under your Part D plan, so you will take both of the vaccines in the series at your pharmacy. Please check with your benefits regarding applicable copays or out of pocket expenses.  The Shingrix is given in 2 vaccines approx 8 weeks apart. You must receive the 2nd dose prior to 6 months from receipt of the first. Please have the pharmacist print out you Immunization  dates for our office records    These are the goals we discussed: Goals    . Weight (lb) < 160 lb (72.6 kg)     Lower her stress level. Discussed ways to decrease her stress Agrees to not take on other's responsibilities  Decrease care giving role   Watch a movie! Make time for your carfts Like to design         This is a list of the screening recommended for you and due dates:  Health Maintenance  Topic Date Due  . Pneumonia vaccines (2 of 2 - PCV13) 05/18/2011  . Flu Shot  02/20/2018  . Urine Protein Check  05/08/2018  . Colon Cancer Screening  01/17/2019  . Tetanus Vaccine  11/20/2021  . DEXA scan (bone density measurement)  Completed      Fall Prevention in the Home Falls can cause injuries. They can happen to people of all ages. There are many things you can do to make your home safe and to help prevent falls. What can I do on the outside  of my home?  Regularly fix the edges of walkways and driveways and fix any cracks.  Remove anything that might make you trip as you walk through a door, such as a raised step or threshold.  Trim any bushes or trees on the path to your home.  Use bright outdoor lighting.  Clear any walking paths of anything that might make someone trip, such as rocks or tools.  Regularly check to see if handrails are loose or broken. Make sure that both sides of any steps have handrails.  Any raised decks and porches should have guardrails on the edges.  Have any leaves, snow, or ice cleared regularly.  Use sand or salt on walking paths during winter.  Clean up any spills in your garage right away. This includes oil or grease spills. What can I do in the bathroom?  Use night lights.  Install grab bars by the toilet and in the tub and shower. Do not use towel bars as grab bars.  Use non-skid mats or decals in the tub or shower.  If you need to sit down in the shower, use a plastic, non-slip stool.  Keep the floor dry. Clean up any water that spills on the floor as soon as it happens.  Remove   soap buildup in the tub or shower regularly.  Attach bath mats securely with double-sided non-slip rug tape.  Do not have throw rugs and other things on the floor that can make you trip. What can I do in the bedroom?  Use night lights.  Make sure that you have a light by your bed that is easy to reach.  Do not use any sheets or blankets that are too big for your bed. They should not hang down onto the floor.  Have a firm chair that has side arms. You can use this for support while you get dressed.  Do not have throw rugs and other things on the floor that can make you trip. What can I do in the kitchen?  Clean up any spills right away.  Avoid walking on wet floors.  Keep items that you use a lot in easy-to-reach places.  If you need to reach something above you, use a strong step stool that  has a grab bar.  Keep electrical cords out of the way.  Do not use floor polish or wax that makes floors slippery. If you must use wax, use non-skid floor wax.  Do not have throw rugs and other things on the floor that can make you trip. What can I do with my stairs?  Do not leave any items on the stairs.  Make sure that there are handrails on both sides of the stairs and use them. Fix handrails that are broken or loose. Make sure that handrails are as long as the stairways.  Check any carpeting to make sure that it is firmly attached to the stairs. Fix any carpet that is loose or worn.  Avoid having throw rugs at the top or bottom of the stairs. If you do have throw rugs, attach them to the floor with carpet tape.  Make sure that you have a light switch at the top of the stairs and the bottom of the stairs. If you do not have them, ask someone to add them for you. What else can I do to help prevent falls?  Wear shoes that: ? Do not have high heels. ? Have rubber bottoms. ? Are comfortable and fit you well. ? Are closed at the toe. Do not wear sandals.  If you use a stepladder: ? Make sure that it is fully opened. Do not climb a closed stepladder. ? Make sure that both sides of the stepladder are locked into place. ? Ask someone to hold it for you, if possible.  Clearly mark and make sure that you can see: ? Any grab bars or handrails. ? First and last steps. ? Where the edge of each step is.  Use tools that help you move around (mobility aids) if they are needed. These include: ? Canes. ? Walkers. ? Scooters. ? Crutches.  Turn on the lights when you go into a dark area. Replace any light bulbs as soon as they burn out.  Set up your furniture so you have a clear path. Avoid moving your furniture around.  If any of your floors are uneven, fix them.  If there are any pets around you, be aware of where they are.  Review your medicines with your doctor. Some medicines  can make you feel dizzy. This can increase your chance of falling. Ask your doctor what other things that you can do to help prevent falls. This information is not intended to replace advice given to you by your health care   provider. Make sure you discuss any questions you have with your health care provider. Document Released: 05/05/2009 Document Revised: 12/15/2015 Document Reviewed: 08/13/2014 Elsevier Interactive Patient Education  2018 [employer]  Health Maintenance, Female Adopting a healthy lifestyle and getting preventive care can go a long way to promote health and wellness. Talk with your health care provider about what schedule of regular examinations is right for you. This is a good chance for you to check in with your provider about disease prevention and staying healthy. In between checkups, there are plenty of things you can do on your own. Experts have done a lot of research about which lifestyle changes and preventive measures are most likely to keep you healthy. Ask your health care provider for more information. Weight and diet Eat a healthy diet  Be sure to include plenty of vegetables, fruits, low-fat dairy products, and lean protein.  Do not eat a lot of foods high in solid fats, added sugars, or salt.  Get regular exercise. This is one of the most important things you can do for your health. ? Most adults should exercise for at least 150 minutes each week. The exercise should increase your heart rate and make you sweat (moderate-intensity exercise). ? Most adults should also do strengthening exercises at least twice a week. This is in addition to the moderate-intensity exercise.  Maintain a healthy weight  Body mass index (BMI) is a measurement that can be used to identify possible weight problems. It estimates body fat based on height and weight. Your health care provider can help determine your BMI and help you achieve or maintain a healthy weight.  For females 20  years of age and older: ? A BMI below 18.5 is considered underweight. ? A BMI of 18.5 to 24.9 is normal. ? A BMI of 25 to 29.9 is considered overweight. ? A BMI of 30 and above is considered obese.  Watch levels of cholesterol and blood lipids  You should start having your blood tested for lipids and cholesterol at 77 years of age, then have this test every 5 years.  You may need to have your cholesterol levels checked more often if: ? Your lipid or cholesterol levels are high. ? You are older than 77 years of age. ? You are at high risk for heart disease.  Cancer screening Lung Cancer  Lung cancer screening is recommended for adults 55-80 years old who are at high risk for lung cancer because of a history of smoking.  A yearly low-dose CT scan of the lungs is recommended for people who: ? Currently smoke. ? Have quit within the past 15 years. ? Have at least a 30-pack-year history of smoking. A pack year is smoking an average of one pack of cigarettes a day for 1 year.  Yearly screening should continue until it has been 15 years since you quit.  Yearly screening should stop if you develop a health problem that would prevent you from having lung cancer treatment.  Breast Cancer  Practice breast self-awareness. This means understanding how your breasts normally appear and feel.  It also means doing regular breast self-exams. Let your health care provider know about any changes, no matter how small.  If you are in your 20s or 30s, you should have a clinical breast exam (CBE) by a health care provider every 1-3 years as part of a regular health exam.  If you are 40 or older, have a CBE every year. Also consider having   a breast X-ray (mammogram) every year.  If you have a family history of breast cancer, talk to your health care provider about genetic screening.  If you are at high risk for breast cancer, talk to your health care provider about having an MRI and a mammogram every  year.  Breast cancer gene (BRCA) assessment is recommended for women who have family members with BRCA-related cancers. BRCA-related cancers include: ? Breast. ? Ovarian. ? Tubal. ? Peritoneal cancers.  Results of the assessment will determine the need for genetic counseling and BRCA1 and BRCA2 testing.  Cervical Cancer Your health care provider may recommend that you be screened regularly for cancer of the pelvic organs (ovaries, uterus, and vagina). This screening involves a pelvic examination, including checking for microscopic changes to the surface of your cervix (Pap test). You may be encouraged to have this screening done every 3 years, beginning at age 21.  For women ages 30-65, health care providers may recommend pelvic exams and Pap testing every 3 years, or they may recommend the Pap and pelvic exam, combined with testing for human papilloma virus (HPV), every 5 years. Some types of HPV increase your risk of cervical cancer. Testing for HPV may also be done on women of any age with unclear Pap test results.  Other health care providers may not recommend any screening for nonpregnant women who are considered low risk for pelvic cancer and who do not have symptoms. Ask your health care provider if a screening pelvic exam is right for you.  If you have had past treatment for cervical cancer or a condition that could lead to cancer, you need Pap tests and screening for cancer for at least 20 years after your treatment. If Pap tests have been discontinued, your risk factors (such as having a new sexual partner) need to be reassessed to determine if screening should resume. Some women have medical problems that increase the chance of getting cervical cancer. In these cases, your health care provider may recommend more frequent screening and Pap tests.  Colorectal Cancer  This type of cancer can be detected and often prevented.  Routine colorectal cancer screening usually begins at 77  years of age and continues through 77 years of age.  Your health care provider may recommend screening at an earlier age if you have risk factors for colon cancer.  Your health care provider may also recommend using home test kits to check for hidden blood in the stool.  A small camera at the end of a tube can be used to examine your colon directly (sigmoidoscopy or colonoscopy). This is done to check for the earliest forms of colorectal cancer.  Routine screening usually begins at age 50.  Direct examination of the colon should be repeated every 5-10 years through 77 years of age. However, you may need to be screened more often if early forms of precancerous polyps or small growths are found.  Skin Cancer  Check your skin from head to toe regularly.  Tell your health care provider about any new moles or changes in moles, especially if there is a change in a mole's shape or color.  Also tell your health care provider if you have a mole that is larger than the size of a pencil eraser.  Always use sunscreen. Apply sunscreen liberally and repeatedly throughout the day.  Protect yourself by wearing long sleeves, pants, a wide-brimmed hat, and sunglasses whenever you are outside.  Heart disease, diabetes, and high blood pressure    High blood pressure causes heart disease and increases the risk of stroke. High blood pressure is more likely to develop in: ? People who have blood pressure in the high end of the normal range (130-139/85-89 mm Hg). ? People who are overweight or obese. ? People who are African American.  If you are 18-39 years of age, have your blood pressure checked every 3-5 years. If you are 40 years of age or older, have your blood pressure checked every year. You should have your blood pressure measured twice-once when you are at a hospital or clinic, and once when you are not at a hospital or clinic. Record the average of the two measurements. To check your blood pressure  when you are not at a hospital or clinic, you can use: ? An automated blood pressure machine at a pharmacy. ? A home blood pressure monitor.  If you are between 55 years and 79 years old, ask your health care provider if you should take aspirin to prevent strokes.  Have regular diabetes screenings. This involves taking a blood sample to check your fasting blood sugar level. ? If you are at a normal weight and have a low risk for diabetes, have this test once every three years after 77 years of age. ? If you are overweight and have a high risk for diabetes, consider being tested at a younger age or more often. Preventing infection Hepatitis B  If you have a higher risk for hepatitis B, you should be screened for this virus. You are considered at high risk for hepatitis B if: ? You were born in a country where hepatitis B is common. Ask your health care provider which countries are considered high risk. ? Your parents were born in a high-risk country, and you have not been immunized against hepatitis B (hepatitis B vaccine). ? You have HIV or AIDS. ? You use needles to inject street drugs. ? You live with someone who has hepatitis B. ? You have had sex with someone who has hepatitis B. ? You get hemodialysis treatment. ? You take certain medicines for conditions, including cancer, organ transplantation, and autoimmune conditions.  Hepatitis C  Blood testing is recommended for: ? Everyone born from 1945 through 1965. ? Anyone with known risk factors for hepatitis C.  Sexually transmitted infections (STIs)  You should be screened for sexually transmitted infections (STIs) including gonorrhea and chlamydia if: ? You are sexually active and are younger than 77 years of age. ? You are older than 77 years of age and your health care provider tells you that you are at risk for this type of infection. ? Your sexual activity has changed since you were last screened and you are at an increased  risk for chlamydia or gonorrhea. Ask your health care provider if you are at risk.  If you do not have HIV, but are at risk, it may be recommended that you take a prescription medicine daily to prevent HIV infection. This is called pre-exposure prophylaxis (PrEP). You are considered at risk if: ? You are sexually active and do not regularly use condoms or know the HIV status of your partner(s). ? You take drugs by injection. ? You are sexually active with a partner who has HIV.  Talk with your health care provider about whether you are at high risk of being infected with HIV. If you choose to begin PrEP, you should first be tested for HIV. You should then be tested every 3 months   for as long as you are taking PrEP. Pregnancy  If you are premenopausal and you may become pregnant, ask your health care provider about preconception counseling.  If you may become pregnant, take 400 to 800 micrograms (mcg) of folic acid every day.  If you want to prevent pregnancy, talk to your health care provider about birth control (contraception). Osteoporosis and menopause  Osteoporosis is a disease in which the bones lose minerals and strength with aging. This can result in serious bone fractures. Your risk for osteoporosis can be identified using a bone density scan.  If you are 65 years of age or older, or if you are at risk for osteoporosis and fractures, ask your health care provider if you should be screened.  Ask your health care provider whether you should take a calcium or vitamin D supplement to lower your risk for osteoporosis.  Menopause may have certain physical symptoms and risks.  Hormone replacement therapy may reduce some of these symptoms and risks. Talk to your health care provider about whether hormone replacement therapy is right for you. Follow these instructions at home:  Schedule regular health, dental, and eye exams.  Stay current with your immunizations.  Do not use any  tobacco products including cigarettes, chewing tobacco, or electronic cigarettes.  If you are pregnant, do not drink alcohol.  If you are breastfeeding, limit how much and how often you drink alcohol.  Limit alcohol intake to no more than 1 drink per day for nonpregnant women. One drink equals 12 ounces of beer, 5 ounces of wine, or 1 ounces of hard liquor.  Do not use street drugs.  Do not share needles.  Ask your health care provider for help if you need support or information about quitting drugs.  Tell your health care provider if you often feel depressed.  Tell your health care provider if you have ever been abused or do not feel safe at home. This information is not intended to replace advice given to you by your health care provider. Make sure you discuss any questions you have with your health care provider. Document Released: 01/22/2011 Document Revised: 12/15/2015 Document Reviewed: 04/12/2015 Elsevier Interactive Patient Education  2018 [employer]   Mammogram A mammogram is an X-ray of the breasts that is done to check for abnormal changes. This procedure can screen for and detect any changes that may suggest breast cancer. A mammogram can also identify other changes and variations in the breast, such as:  Inflammation of the breast tissue (mastitis).  An infected area that contains a collection of pus (abscess).  A fluid-filled sac (cyst).  Fibrocystic changes. This is when breast tissue becomes denser, which can make the tissue feel rope-like or uneven under the skin.  Tumors that are not cancerous (benign).  Tell a health care provider about:  Any allergies you have.  If you have breast implants.  If you have had previous breast disease, biopsy, or surgery.  If you are breastfeeding.  Any possibility that you could be pregnant, if this applies.  If you are younger than age 25.  If you have a family history of breast cancer. What are the  risks? Generally, this is a safe procedure. However, problems may occur, including:  Exposure to radiation. Radiation levels are very low with this test.  The results being misinterpreted.  The need for further tests.  The inability of the mammogram to detect certain cancers.  What happens before the procedure?  Schedule your test   about 1-2 weeks after your menstrual period. This is usually when your breasts are the least tender.  If you have had a mammogram done at a different facility in the past, get the mammogram X-rays or have them sent to your current exam facility in order to compare them.  Wash your breasts and under your arms the day of the test.  Do not wear deodorants, perfumes, lotions, or powders anywhere on your body on the day of the test.  Remove any jewelry from your neck.  Wear clothes that you can change into and out of easily. What happens during the procedure?  You will undress from the waist up and put on a gown.  You will stand in front of the X-ray machine.  Each breast will be placed between two plastic or glass plates. The plates will compress your breast for a few seconds. Try to stay as relaxed as possible during the procedure. This does not cause any harm to your breasts and any discomfort you feel will be very brief.  X-rays will be taken from different angles of each breast. The procedure may vary among health care providers and hospitals. What happens after the procedure?  The mammogram will be examined by a specialist (radiologist).  You may need to repeat certain parts of the test, depending on the quality of the images. This is commonly done if the radiologist needs a better view of the breast tissue.  Ask when your test results will be ready. Make sure you get your test results.  You may resume your normal activities. This information is not intended to replace advice given to you by your health care provider. Make sure you discuss any  questions you have with your health care provider. Document Released: 07/06/2000 Document Revised: 12/12/2015 Document Reviewed: 09/17/2014 Elsevier Interactive Patient Education  2018 [employer]  

## 2017-12-12 NOTE — Progress Notes (Signed)
I have reviewed and agree with note, evaluation, plan.   Stephen Hunter, MD  

## 2017-12-12 NOTE — Progress Notes (Signed)
Subjective:   Laura [name] is a 77 y.o. female who presents for Medicare Annual (Subsequent) preventive examination.  Reports health as good  Psychosocial 2 sisters live here One child is in [place] and one in [place] Hx sarcoid (lungs  And uveitis ) followed by Dr. Lenna Gilford  2 sisters in senior living; she is the caregiver    Goes to class with other spouses of those with parkinson's  Good support and very appropriate and supportive in her management of spouse's needs.   Does a lot of fresh cooking  Went on prednisone -  borderline DM A1c is 6.4 2018 but down to 6.1 - has been on  Prednisone Now from 40 to 10mg  and still titrating down To see Dr. Lenna Gilford on Monday  C/o of being a little short winded when steps or incline   BMI 26   Educated on pre diabetes but BS may improve once off prednisone  Regular exercise  Keeping a tri-level home up Joined program for women x 3 days when her spouse is doing his exercises in his Parkinson's group  Interesting classes with [employer]    Health Maintenance Due  Topic Date Due  . PNA vac Low Risk Adult (2 of 2 - PCV13) 05/18/2011   Colonoscopy due 12/2015 and due 12/2018    Dr. Marvel Plan follows and has ordered both mammogram and your dexa  Tried fosamax and had digestive issues  Dexa 2018 -1.7 - has discussed prolia- will revisit at her next dexa Mammogram 2009 but followed by GYN It hasn't had one in 3 years but will probably have one this year   Educated regarding shingrix    (hx of melanoma) mother had it  Hx of colon cancer in family )    Objective:     Vitals: BP 130/70   Pulse 72   Ht 5\' 8"  (1.727 m)   Wt 174 lb (78.9 kg)   SpO2 94%   BMI 26.46 kg/m   Body mass index is 26.46 kg/m.  Advanced Directives 12/30/2016 10/26/2016 01/17/2016 01/03/2016 10/18/2015 10/16/2015 11/02/2014  Does Patient Have a Medical Advance Directive? Yes Yes Yes Yes Yes No Yes  Type of Paramedic of  [place];Living will - - Living will;Healthcare Power of Attorney Living will - Living will  Does patient want to make changes to medical advance directive? - - - - - - No - Patient declined  Copy of [place] in Chart? No - copy requested - No - copy requested - - - No - copy requested  Would patient like information on creating a medical advance directive? - - - - - No - patient declined information -    Tobacco Social History   Tobacco Use  Smoking Status Never Smoker  Smokeless Tobacco Never Used     Counseling given: Not Answered   Clinical Intake:    Past Medical History:  Diagnosis Date  . Adenomatous colon polyp    sessile  . Allergy    SEASONAL  . Anxiety   . Cataract    bilateral  . Colon polyps   . Diverticulosis of colon (without mention of hemorrhage)   . DJD (degenerative joint disease)   . Dysrhythmia    pac  . Esophagitis   . Essential hypertension 06/04/2017  . GERD (gastroesophageal reflux disease)   . Hiatal hernia   . Hypercholesterolemia   . IBS (irritable bowel syndrome)   . MVP (mitral valve prolapse)   .  Osteopenia   . Personal history of colonic polyps    SESSILE SERRATED ADENOMAS (X2)  . PONV (postoperative nausea and vomiting)   . Sarcoidosis   . Shortness of breath dyspnea   . Stricture and stenosis of esophagus   . Unspecified gastritis and gastroduodenitis without mention of hemorrhage   . Urinary tract infection, site not specified   . Uveitis    Past Surgical History:  Procedure Laterality Date  . ABDOMINAL HYSTERECTOMY    . BASAL CELL CARCINOMA EXCISION    . CATARACT EXTRACTION Bilateral   . COLONOSCOPY  2012  . MASS EXCISION Left 11/02/2014   Procedure: EXCISION MASS LEFT ELBOW;  Surgeon: Daryll Brod, MD;  Location: [facility];  Service: Orthopedics;  Laterality: Left;  . POLYPECTOMY    . UPPER GASTROINTESTINAL ENDOSCOPY    . VESICOVAGINAL FISTULA CLOSURE W/ TAH     "sling" after  hystrectomy per patient   Family History  Problem Relation Age of Onset  . Melanoma Mother        per pt melanoma   . Colon cancer Sister        sees Dr Henrene Pastor   . Colon polyps Brother   . Sarcoidosis Brother   . Colon polyps Sister        Dr stark   . Colon polyps Sister   . Endometrial cancer Sister   . Lung cancer Brother   . Other Unknown        nephew with Wegener's   . Stroke Sister   . Osteoporosis Neg Hx    Social History   Socioeconomic History  . Marital status: Married    Spouse name: Lainie Daubert  . Number of children: 2  . Years of education: Not on file  . Highest education level: Not on file  Occupational History  . Occupation: Retired    Comment: & Part Time Artist  Social Needs  . Financial resource strain: Not on file  . Food insecurity:    Worry: Not on file    Inability: Not on file  . Transportation needs:    Medical: Not on file    Non-medical: Not on file  Tobacco Use  . Smoking status: Never Smoker  . Smokeless tobacco: Never Used  Substance and Sexual Activity  . Alcohol use: Yes    Alcohol/week: 0.6 oz    Types: 1 Glasses of wine per week    Comment: occ wine   . Drug use: No  . Sexual activity: Not on file  Lifestyle  . Physical activity:    Days per week: Not on file    Minutes per session: Not on file  . Stress: Not on file  Relationships  . Social connections:    Talks on phone: Not on file    Gets together: Not on file    Attends religious service: Not on file    Active member of club or organization: Not on file    Attends meetings of clubs or organizations: Not on file    Relationship status: Not on file  Other Topics Concern  . Not on file  Social History Narrative   Married- husband with parkinsons. Will be patient of Dr. Yong Channel   Needs time to care for husband      Retired Artist (until 2017), bookkeeping prior      Hobbies: cooking, florist work, Barrister's clerk    Outpatient Encounter Medications  as of 12/12/2017  Medication Sig  .  amLODipine (NORVASC) 5 MG tablet Take 2.5 mg by mouth daily.  . B Complex Vitamins (VITAMIN-B COMPLEX PO) Take 1 capsule by mouth daily.   . Calcium Carbonate (CALTRATE 600 PO) Take 1 tablet by mouth 2 (two) times daily.    . cholecalciferol (VITAMIN D) 1000 units tablet Take 1,000 Units by mouth daily.  . Esomeprazole Magnesium (NEXIUM 24HR) 20 MG TBEC Take 1 tablet by mouth 2 (two) times daily.  . Glucosamine HCl (GLUCOSAMINE PO) Liquid form - Take by mouth as directed once daily  . meclizine (ANTIVERT) 25 MG tablet Take 1/2-1 tablet by mouth every 4 hours as needed for dizziness  . Multiple Vitamins-Minerals (MULTIVITAL) tablet Take 1 tablet by mouth daily.    . Omega-3 Fatty Acids (FISH OIL) 1000 MG CAPS Take 1 capsule by mouth daily.    . ondansetron (ZOFRAN) 8 MG tablet 1 tablet by mouth every 6 hours as needed for nausea  . simvastatin (ZOCOR) 20 MG tablet Take 20 mg by mouth at bedtime.  . sucralfate (CARAFATE) 1 g tablet Take 1 tablet (1 g total) by mouth 2 (two) times daily. (Patient taking differently: Take 1 g by mouth as needed. )   No facility-administered encounter medications on file as of 12/12/2017.     Activities of Daily Living No flowsheet data found.  Patient Care Team: Marin Olp, MD as PCP - General (Family Medicine)    Assessment:   This is a routine wellness examination for [place].  Exercise Activities and Dietary recommendations    Goals    . Weight (lb) < 160 lb (72.6 kg)     Lower her stress level. Discussed ways to decrease her stress Agrees to not take on other's responsibilities  Decrease care giving role   Watch a movie! Make time for your carfts Like to design         Fall Risk Fall Risk  11/05/2017 10/26/2016 10/18/2016 12/09/2013 12/02/2012  Falls in the past year? No No No No Yes  Number falls in past yr: - - - - 1  Injury with Fall? - - - - Yes     Depression Screen PHQ 2/9 Scores 11/05/2017  10/26/2016 10/18/2016 12/09/2013  PHQ - 2 Score 0 0 0 0     Cognitive Function MMSE - Mini Mental State Exam 10/26/2016  Not completed: (No Data)  Ad8 score reviewed for issues:  Issues making decisions:  Less interest in hobbies / activities:  Repeats questions, stories (family complaining):  Trouble using ordinary gadgets (microwave, computer, phone):  Forgets the month or year:   Mismanaging finances:   Remembering appts:  Daily problems with thinking and/or memory: Ad8 score is=0         Immunization History  Administered Date(s) Administered  . Influenza Split 05/23/2011, 05/23/2012, 05/04/2013, 05/31/2014  . Influenza Whole 09/11/2009, 05/08/2010  . Influenza, High Dose Seasonal PF 05/02/2016, 05/08/2017  . Influenza-Unspecified 05/25/2015  . Pneumococcal Polysaccharide-23 05/17/2010  . Tdap 11/21/2011     Screening Tests Health Maintenance  Topic Date Due  . PNA vac Low Risk Adult (2 of 2 - PCV13) 05/18/2011  . INFLUENZA VACCINE  02/20/2018  . URINE MICROALBUMIN  05/08/2018  . COLONOSCOPY  01/17/2019  . TETANUS/TDAP  11/20/2021  . DEXA SCAN  Completed         Plan:      PCP Notes   Health Maintenance Colonoscopy due 12/2015 and due 12/2018    Dr. Marvel Plan follows and has ordered both  mammogram and your dexa at his office Tried fosamax and had digestive issues  Dexa 2018 -1.7 - has discussed prolia- will revisit at her next dexa  Mammogram 2009 but followed by GYN It hasn't had one in 3 years but will probably have one this year   Educated regarding shingrix   Abnormal Screens  None   Referrals  None   Patient concerns; Noted stress but discussed carving some time out for herself.   Nurse Concerns; As noted  Next PCP apt 05/08/2018      I have personally reviewed and noted the following in the patient's chart:   . Medical and social history . Use of alcohol, tobacco or illicit drugs  . Current medications and  supplements . Functional ability and status . Nutritional status . Physical activity . Advanced directives . List of other physicians . Hospitalizations, surgeries, and ER visits in previous 12 months . Vitals . Screenings to include cognitive, depression, and falls . Referrals and appointments  In addition, I have reviewed and discussed with patient certain preventive protocols, quality metrics, and best practice recommendations. A written personalized care plan for preventive services as well as general preventive health recommendations were provided to patient.     Wynetta Fines, RN  12/12/2017

## 2017-12-17 ENCOUNTER — Encounter: Payer: Self-pay | Admitting: Pulmonary Disease

## 2017-12-17 ENCOUNTER — Other Ambulatory Visit: Payer: Medicare Other

## 2017-12-17 ENCOUNTER — Ambulatory Visit: Payer: Medicare Other | Admitting: Pulmonary Disease

## 2017-12-17 ENCOUNTER — Ambulatory Visit (INDEPENDENT_AMBULATORY_CARE_PROVIDER_SITE_OTHER)
Admission: RE | Admit: 2017-12-17 | Discharge: 2017-12-17 | Disposition: A | Discharge status: Home / Self Care | Payer: Medicare Other | Source: Ambulatory Visit | Attending: Pulmonary Disease | Admitting: Pulmonary Disease

## 2017-12-17 VITALS — BP 122/74 | HR 82 | Temp 97.9°F | Ht 65.0 in | Wt 171.8 lb

## 2017-12-17 DIAGNOSIS — D869 Sarcoidosis, unspecified: Secondary | ICD-10-CM

## 2017-12-17 DIAGNOSIS — I10 Essential (primary) hypertension: Secondary | ICD-10-CM | POA: Diagnosis not present

## 2017-12-17 DIAGNOSIS — K222 Esophageal obstruction: Secondary | ICD-10-CM | POA: Diagnosis not present

## 2017-12-17 DIAGNOSIS — G8929 Other chronic pain: Secondary | ICD-10-CM | POA: Diagnosis not present

## 2017-12-17 DIAGNOSIS — F411 Generalized anxiety disorder: Secondary | ICD-10-CM | POA: Diagnosis not present

## 2017-12-17 DIAGNOSIS — K219 Gastro-esophageal reflux disease without esophagitis: Secondary | ICD-10-CM | POA: Diagnosis not present

## 2017-12-17 DIAGNOSIS — M150 Primary generalized (osteo)arthritis: Secondary | ICD-10-CM

## 2017-12-17 DIAGNOSIS — M8000XA Age-related osteoporosis with current pathological fracture, unspecified site, initial encounter for fracture: Secondary | ICD-10-CM | POA: Diagnosis not present

## 2017-12-17 DIAGNOSIS — S32010D Wedge compression fracture of first lumbar vertebra, subsequent encounter for fracture with routine healing: Secondary | ICD-10-CM | POA: Diagnosis not present

## 2017-12-17 DIAGNOSIS — M159 Polyosteoarthritis, unspecified: Secondary | ICD-10-CM

## 2017-12-17 DIAGNOSIS — R002 Palpitations: Secondary | ICD-10-CM

## 2017-12-17 DIAGNOSIS — M545 Low back pain: Secondary | ICD-10-CM | POA: Diagnosis not present

## 2017-12-17 NOTE — Progress Notes (Signed)
Subjective:    Patient ID: Laura [name], female    DOB: [birth year], 77 y.o.   MRN: [id number]  HPI 77 y/o WF here for a follow up visit... she has prob Sarcoidosis w/ hx Uveitis and BHA + parenchymal dis on CXR => Pred therapy started 12/2015 due to progressive CXT/ CT abnormality...   ~  SEE PREV EPIC NOTES FOR OLDER DATA >>   LABS 5/13:  FLP- at goals on Simva40;  Chems- wnl;  CBC- wnl;  TSH=1.61;  VitD=47  ADDENDUM>> she received TDAP w/ local reaction req antihist & topical Rx...  CXR 11/13 showed similar changes- irreg nodular changes in right suprahilar region, scarring, stable adenopathy, mild atherosclerotic calcif, osteopenia...  ~  Laura [name] reports that in Feb2014 she fell while getting up at night & hit her back w/ severe pain & L1 compression fx- adm to [facility] for 2d w/ kyphoplasty & told to have LLLpneumonia treated w/ Levaquin.   CXR 3/14 showed normal heart size, atherosclerotic Ao, chr scarring from underlying sarcoid, NAD...  LABS 5/14:  FLP- at goals on Simva40;  Chems- wnl;  CBC- wnl;  TSH=1.74;  VitD=52... ~   Seen by Laura [name] for Cards 3/15> 2DEcho showed norm LVF, norm valves, no signs of pulmHTN; subseq stress testing showed extreme dyspnea w/ exercise (poor functional capacity), no ischemia; she has been encouraged to join silver sneakers and incr her exercise program...   LABS 1/15:  Chems- ok x K=3.4;  CBC- wnl;  TSH=2.28;  Mag=2.0.Marland KitchenMarland Kitchen  CXR 1/15 showed norm heart size, areas of scarring appear unchanged, prom hilae w/o change- stable, NAD.Marland KitchenMarland Kitchen   EKG 3/15 showed NSR, rate79, wnl, NAD...  Stress Test 3/15 showed poor exercise capacity, no ischemia...  2DEcho 3/15 showed normal LVF w/ EF=55-60%, no regional wall motion abn, normal valves, no evid for pulmHTN...  PFTs 5/15 showed just some sm airways dis> FVC=2.65 (78%), FEV1=1.86 (72%); %1sec=70; mid-flows=55% predicted... After bronchodil her FEV1 improved 6%... Lung Volumes & DLCO are wnl...    LABS 5/15:  ACE level = 55     LABS 11/15:  FLP- looks good on Simva40, needs to lose wt;  Chems- wnl;  CBC- wnl;  TSH=2.19;  VitD=52... ~  April 2016:  Laura [name] had left elbow surg for a nodule & excision by [name] showed path=numerous nonnecrotizing granulomas, spec stains neg & this is c/w an unusual manifestation of her Sarcoid;   CXR 5/16 showed stable BHA & scarring on right, NAD, no change...  LABS 5/16:  ACE=56, stable...  LABS 06/07/16>  FLP- all parameters at goals on Simva40;  Chems- wnl;  CBC- wnl;  TSH=1.86;  VitD=52;  ACE=63     ~  December 26, 2015:  6-65moROberlinremains asymptomatic, feeling well, and denies CP/ palpit. Cough/ phlegm/ hemoptysis, SOB, edema, etc;  She tells me that she got the Flu at the end of March noting HA, sore throat, severe cough w/o much sput, low grade temp, aching & sore, etc & was seen in the ER where CXR showed bilat hilar adenopathy & increased curvilinear thickening on the right but no acute process;  Flu panel was POS for TypeA flu;  She had f/u OV w/ TP shortly thereafter & given ZPak, Tessalon, Mucinex, Delsym, fluids;  She notes that it took her 8wks to fully resolve & get her energy back!  We reviewed the following medical problems during today's office visit >>     Ophthalmology> Hx uveitis, s/p bilat  cat surg, followed at [facility]...    Sarcoid> Hx uveitis & abn CXR w/ BHA, she is asymptomatic; baseline CXR w/ adenop, scarring & chr changes; hx ?LLLpneum 0/94 in [place] treated w/ Levaquin; last CXR 5/16 was stable and last ACE=63; she had nodule excised from left elbow 4/16 by Laura [name]=> granulomatous nodule c/w unusual manifestation of Sarcoid, all spec stains were neg...    HxMVP, palpit w/ PVCs & PACs on Holter> on ASA81;  She denies CP, palpit, SOB now; advised no caffeine & avoid stress!    Chol> on Simva40 & FishOil; last FLP 11/15 showed TChol 169, TG 135, HDL 41, LDL 101; reviewed diet & wt reduction...    GI> GERD, IBS, Polyp> on  Prilosec40Bid, Carafate1GmQhs, & Zofran prn; followed by Laura [name]- she remains asymptomatic w/o abd pain, n/v, c/d, blood seen...       ADDENDUM> she had colonoscopy 01/17/16> + for divertics, hemorrhoids, and several polyps- 4 to 45m size & Path= sessile serrated adenomas & f/u suggested 3 yrs.    UTIs> she reports no prob on Cranberry juice but had one recurrent UTI 4/16- Cipro cqalled in & symptoms resolved....    DJD, LBP, L1 compression w/ kyphoplasty, Osteop> on calcium, MVI, VitD & Tramadol50; she fell 2/14 w/ L1 compression & [place] [employer] [place] 2/16 w/ Tscore-1.8 & we rec ALENDRONATE70/wk...    Anxiety> she has a lot of family support, doesn't require meds... EXAM shows Afeb, VSS, O2sat=97% on RA;  HEENT- neg, Mallampati1;  Chest- clear w/o w/r/r;  Heart- RR w/o m/r/g;  Abd- soft, nontender, neg;  Ext- neg w/o c/c/e;  Neuro- intact w/o focal abn...  CXR 10/16/15> I have reviewed this film & compared to prev images>  bilat hilar adenopathy & increased opac in left superior hilum and right lat hilar region w/ extension into ant RUL (similar to 11/2014 films but progressed from previous CXRs) => we will proceed w/ f/u CT Chest...  LABS 10/16/15>  Flu panel showed POS FluA;  Neg FluB and H1N1...   LABS 12/2015>  ACE=62 (nl=8-52);  VitD=51;  BMet- ok x BS=131, Cr=0.74, Ca=9.4..Marland KitchenMarland Kitchen CT Chest w/ contrast 01/06/16>  Norm heart size, aortic & coronary atherosclerosis, part calcif mediastinal adenopathy (unchanged from 2009), progression of bilat hilar adenopathy & pulm parenchymal changes w/ mass-like density in RML~4cm, and mass-like density in ant LUL ~4x2cm -- radiology feels this is c/w sarcoid progression.   Full PFTs 01/06/16>  FVC=2.66 (80%), FEV1=1.82 (72%), %1sec=68, mid-flows reduced at 53% predicted; post bronchodil FEV1 improved 5% to 1.92;  TLC=5.57 (98%), RV=2.72 (108%), RV/TLC=49%;  DLCO=70% pred (DL/VA=96%). IMP/PLAN>>  Concern for active sarcoidosis w/ the recent CXR and CT changes;  she has never had a biopsy due to remote presentation w/ BHA and uveitis and the fact that she has been devoid of resp symptoms, and prev had neg ACE levels (ie- no signs of dis activity);  Her recent CXR changes (progression), sl elev ACE in the low 60s, suggests some activity yet she remains stoic & asymptomatic;  She would like to proceed w/o lung Bx if poss & I discussed trial PREDNISONE w/ short term f/u to check ACE & CXR (if not improved then we will address need for lung bx at that time);  REC to start Pred40/d x2wks, 30/d x2wks, then 20/d til return in about 6wks time...   ~  February 22, 2016:  262moOV & f/u sarcoid> In Jun2017 we did CTChest showing norm heart size, aortic & coronary  atherosclerosis, part calcif mediastinal adenopathy (unchanged from 2009), progression of bilat hilar adenopathy & pulm parenchymal changes w/ mass-like density in RML~4cm, and mass-like density in ant LUL ~4x2cm -- radiology feels this is c/w sarcoid progression;  ACE level= 62;  We discussed poss Bx but elected to start Pred therapy w/ 40-30-20 Q2wks and she returns today on Pred20/d noting various symptoms related to the Pred she feels eg- Ha, sweating, trembling, incr HR, & short tempered; these symptoms have diminished w/ decr in the dose; she also notes SOB, can't get a DB, & denies CP- but she has stepped up her walking program & doing better now;  We discussed checking ACE level & weaning Pred further based on this result...    EXAM shows Afeb, VSS, O2sat=97% on RA;  HEENT- neg, Mallampati1;  Chest- clear w/o w/r/r;  Heart- RR w/o m/r/g;  Abd- soft, nontender, neg;  Ext- neg w/o c/c/e;  Neuro- intact w/o focal abn...  LABS 02/2016>  BMet- ok w/ BS=100, A1c= 6.7, Cr=0.81, K=3.9;  ACE=23... IMP/PLAN>>  Laura [name] seems somewhat improved on the Pred & ACE is down to 23;  I suggest that she keep the Pred20/d for another 2wks then cut it to 64m alt w/ 163mQod til return in 6 wks time...  ~  April 04, 2016:  6wk ROV &  f/u sarcoid>  She reports stable on the Pred 20-10 Qod for the past month & doing better on the lower doses, BS better & wt stable at 170#- we reviewed low carb, low sodium diet;  She denies CP, SOB, cough, sput, hemoptysis, etc...    EXAM shows Afeb, VSS, O2sat=97% on RA;  HEENT- neg, Mallampati1;  Chest- clear w/o w/r/r;  Heart- RR w/o m/r/g;  Abd- soft, nontender, neg;  Ext- neg w/o c/c/e;  Neuro- intact w/o focal abn...  CXR 04/04/16>  Stable heart size, mild atherosclerosis of Ao, BHA & perihilar opacities sl decreased from prev films, no new airspace dis... IMP/PLAN>>  We decided to continue the Pred 2050mlt w/ 37m40md thru Oct & then decr to 37mg61mtarting in Nov until her f/u visit in 36mo w71mou CXR & Labs...  ~  June 26, 2016:  36mo RO736moLois reLillianahs on Pred 37mg/d 67mthe last month- she is still noting mult somatic complaints and blames the Pred=> we discussed change to MEDROL 8mg tabs76m see if she tolerates this better; she tells me she's been exercising 3d/wk at the gym & doing satis, no cough/ sputum, and dyspnea improving; she notes occas palpit "fluttering" and more aware of heart beat- I have tried to get her to take the Alpraz0.25mg tabs42m1/2 to1 tab Tid more regularly; we will recheck labs today, & she will need f/u CT Chest in early 2018 (16yr f/u sc45yr..    EXAM shows Afeb, VSS, O2sat=97% on RA;  HEENT- neg, Mallampati1;  Chest- clear w/o w/r/r;  Heart- RR w/o m/r/g;  Abd- soft, nontender, neg;  Ext- neg w/o c/c/e;  Neuro- intact w/o focal abn...  LABS 06/26/16> Chems- ok x BS=117;  ACE=28 IMP/PLAN>>  Ezme perceiJarynsome difficulty w/ the Pred but she is improved overall & ACE down to 28; we decided to change to MEDROL 8mg tabs ta20mgone Qam for Dec & cut to 8mg alternat83m w/ 4mg QOD start48m Jan2018=> we will recheck her in Feb w/ f/u CTChest due and also due for 69yr recheck BM18yr Note: >50% of this 15min visit was66mnt in  counseling and coordination of care.  ~  August 28, 2016:  68moROV & Laura [name] is improved- no new complaints or concerns, she has maintained on Pred 10-5 Qod for her sarcoidosis;  She did not tolerate MEDROL saying that "it messed up my tummy" w/ cramps and diarrhea which resolved after switch to Pred...she denies cough, sput, SOB, CP, etc- no f/c/s... She is due for f/u CXR/ Labs today...    EXAM shows Afeb, VSS, O2sat=97% on RA;  HEENT- neg, Mallampati1;  Chest- clear w/o w/r/r;  Heart- RR w/o m/r/g;  Abd- soft, nontender, neg;  Ext- neg w/o c/c/e;  Neuro- intact w/o focal abn...  CXR 08/28/16 (independently reviewed by me in the PACS system) showed norm heart size, aortic atherosclerosis, similar BHA & incr markings unchanged from prev (by my review the markings are better than 09/2015 & similar to 9/17).   LABS 08/28/16>  Chems- ok x K=3.4, BS is wnl at 94, LFTs wnl, A1c=6.4,  ACE level = 24 => ok to cut Pred to 593mpo Qd... IMP/PLAN>>  We will decr her Pred to 65m465maily, asked to maintain her diet & exercise program, ROV in 70mo67moonsider CT f/u...  ~  Nov 27, 2016:  70mo 70mo& f/u Sarcoidosis> Laura [name] that her breathing is good & she has no new complaints or concerns, she is exercising in a gym for 1H 3d/wk, doing zoomba etc; current Pred dose is 10mg 37mw/ 65mg Qo74mnd she confirms- no cough, sput, or hemoptysis; her SOB/DOE is stable w/ no difficulty w/ ADLs but notes trouble w/ inclines and stairs; she is also under stress from mult fronts-- Jim's parkinsons & some mental changes, sisters Louise Barbaraann Share moBertram Millard to CarolinWalt DisneysepeNortheast UtilitiesisJakiyarful & has a support group)... NOTE: she reminds me that her brother had Sarcoid in his 40s, ve69sservere, almost died, [place] w/ PCP-DrHunter 10/18/16> establish care, hx Sarcoid, HL(on Simva40), DM (A1c=6.4 on diet), GERD (on Nexium Bid), osteoporosis (referred to endocrine)...    She saw DrEllison 11/19/16 for osteoporosis> [facility] & stopped due to arthralgias, BMD 08/2016 showed lowest Tscore= -1.7 in RFN w/ incr risk;  REC to prevent falls, Ca 1200mg/d,34mD 400u/d, & start Prolia 2x/yr (SPE- neg, TSH-0.79, VitD-69, PTH=wnl at 25)...    EXAM shows Afeb, VSS, O2sat=100% on RA;  HEENT- neg, Mallampati1;  Chest- clear w/o w/r/r;  Heart- RR w/o m/r/g;  Abd- soft, nontender, neg;  Ext- neg w/o c/c/e;  Neuro- intact w/o focal abn... IMP/PLAN>>  We discussed decreasing her Pred from 10-5 Qod to 65mg/d w/62mutine rov in 70mo; I al20moncouraged her use of the Prolia for her osteopenia + hx compression fx & need for Pred rx...  ~  March 07, 2017:  70mo ROV & 46mo visit Nakeya was doAlexianawell- exercising at gym & w/ zoomba, asymptomatic from the Sarcoid standpoint on Pred 10 alt w/ 5 qod- so we decided to decr to 65mg daily..71mhe reports stable on the Pred 65mg/d over t43mlast 70mo & notes f56mo side effects like sweating, irritability, etc; her breathing is good- no cough/ sputum/ hemoptysis; DOE is similar noted esp w/ inclines & stairs, no worse, no CP etc...     We reviewed the following medical problems during today's office visit >>  Ophthalmology> Hx uveitis, s/p bilat cat surg, followed at [facility]...    Sarcoid> Hx uveitis & abn CXR w/ BHA, she is asymptomatic; baseline CXR w/ adenop, scarring & chr changes; hx ?LLLpneum 3/97 in [place] treated w/ Levaquin; f/uCXR 5/16 was stable and ACE=63; she had nodule excised from left elbow 4/16 by Laura [name]=> granulomatous nodule c/w unusual manifestation of Sarcoid, all spec stains were neg... CT Chest 6/17 showed progression of bilat hilar adenopathy & pulm parenchymal changes w/ mass-like density in RML~4cm, and mass-like density in ant LUL ~4x2cm -- radiology feels this is c/w sarcoid progression & ACE up to 63 => she declined bx & we started on Pred Rx=> CXR & ACE improved as we wean the Pred... NOTE: she reminds me that her brother had Sarcoid in  his 29s, very servere, almost died, Rx in [place]    HxMVP, palpit w/ PVCs & PACs on Holter> on ASA81;  She denies CP, palpit, SOB now; advised no caffeine & avoid stress!    Chol> on Simva40 & FishOil; last FLP 11/16 showed TChol 161, TG 146, HDL 44, LDL 87; reviewed diet & wt reduction...    GI> GERD, spasm, IBS, Polyp, +FamHx colon ca in sis> on Nexium200Bid, Carafate1GmQhs, & Zofran prn; followed by Laura [name]- she remains asymptomatic w/o abd pain, n/v, c/d, blood seen...       ADDENDUM> she had colonoscopy 01/17/16> + for divertics, hemorrhoids, and several polyps- 4 to 73m size & Path= sessile serrated adenomas & f/u suggested 3 yrs.    UTIs> she reports no prob on Cranberry juice but had one recurrent UTI 4/16- Cipro cqalled in & symptoms resolved....    DJD, LBP, L1 compression w/ kyphoplasty, Osteop> on calcium, MVI, VitD & Tramadol50; she fell 2/14 w/ L1 compression [place] w/ Tscore-1.8 & we rec ALENDRONATE70/wk=> she stopped & Endocrine consult DrEllison rec Prolia Q661mo.    Anxiety> she has a lot of family issues, on Xanax 0.25 prn... EXAM shows Afeb, VSS, O2sat=97% on RA;  HEENT- neg, Mallampati1;  Chest- clear w/o w/r/r;  Heart- RR w/o m/r/g;  Abd- soft, nontender, neg;  Ext- neg w/o c/c/e;  Neuro- intact w/o focal abn...  LABS 03/07/17>  ACE level = 53 (up from 24-28 range, but still wnl w/ norm range=9/67);  BMet- ok x BS=122;  CBC- OK w/ Hg=16.2, WBC=9.1 IMP/PLAN>>  With the incr in ACE to 53- we decided to continue the Pred 12m712maily; she is asked to maintain her exercise program etc; she will call prn any resp problems and we plan ROV recheck in 3-82mo482mo ~  June 20, 2017:  3-82mo 70mo&Aransasremained stable on the Pred 12mg/d82mor the last 53mo- A253momo ago27moped up from 24=>53);  She reports an upper resp infection over the last wk w/ sore throat, sinus congestion, HA, clear drainage, watery eyes, & "not feeling well" she says, and "my eyes are  weak"; her temp was 98 which she notes is one degree of fever for her, denies chills/ sweats; she notes dry cough but denies chest congestion/ tightness/ wheezing...    Sarcoid> Hx uveitis & abn CXR w/ BHA, she is asymptomatic; baseline CXR w/ adenop, scarring & chr changes; [place]; f/uCXR 5/16 was stable and ACE=63; she had nodule excised from left elbow 4/16 by Laura [name]=> granulomatous nodule c/w unusual manifestation of Sarcoid, all spec stains were neg... CT Chest 6/17 showed progression of  bilat hilar adenopathy & pulm parenchymal changes w/ mass-like density in RML~4cm, and mass-like density in ant LUL ~4x2cm -- radiology feels this is c/w sarcoid progression & ACE up to 63 => she declined bx & we started on Pred Rx=> CXR & ACE improved as we wean the Pred...  NOTE: she reminds me that her brother had Sarcoid in his 2s, very servere, almost died, Rx in [place] shows Afeb, VSS, O2sat=97% on RA;  HEENT- neg, Mallampati1;  Chest- clear w/o w/r/r;  Heart- RR w/o m/r/g;  Abd- soft, nontender, neg;  Ext- neg w/o c/c/e;  Neuro- intact w/o focal abn; no palp adenopathy...  CXR 06/20/17 (independently reviewed by me in the PACS system) showed chr hilar enlargement & architectural distortion stable since 2017, incr interstitial markings are similarly stable, scoliosis w/ upper lumbar augmnentation...  LABS 06/04/17 by DrHunter>  Chems- wnl x BS=116;  CBC- wnl w/ Hg=16.1;  TSH=1.29...  LABS 06/20/17>  ACE=42,  Sed=8,  CRP=0.3 IMP/PLAN>>  Laura [name] has an upper resp infection on top of her chronic sarcoidosis- on Pred 32m/d for the last 668mor so & her CXR is stable and inflamm markers are neg + ACE=42;  We discussed OTC rx w/ Antihist & Flonase + Rx w/ ZPak;  We decided to keep the Pred 24m37m for her sarcoidosis in light of her CXR changes (likely scarring now) and ACE 42...   ~  September 19, 2017:  90mo724mo & f/u sarcoid>  During her last OV 05/2017 LoisMalone a  URI superimposed on her chr sarcoid; CXR showed stable chr hilar enlargement & incr interstitial markings; ACE=42 on Pred24mg/13m& we decided to treat w/ ZPak + Antihist/ Flonase;  She responded to the Zithromax & ret to baseline;  Currently doing satis- feels at baseline & doing well overall, notes DOE w/ stairs & hills but exercising 3d/wk at gym, denies cough/ sput/ hemoptysis, denies f/c/s, denies CP/ palpit/ edema...     Sarcoid> Hx uveitis & abn CXR w/ BHA, she was mostly asymptomatic; baseline CXR w/ adenop, scarring & chr changes; [place]; f/uCXR 5/16 was stable and ACE=63; she had nodule excised from left elbow 4/16 by Laura [name]=> granulomatous nodule c/w unusual manifestation of Sarcoid, all spec stains were neg... CT Chest 6/17 showed progression of bilat hilar adenopathy & pulm parenchymal changes w/ mass-like density in RML~4cm, and mass-like density in ant LUL ~4x2cm -- radiology feels this is c/w sarcoid progression & ACE up to 63 => she declined bx & we started on Pred Rx=> CXR & ACE improved as we wean the Pred...  NOTE: she reminds me that her brother had Sarcoid in his 40s, 45sy servere, almost died, [place] had Wegener's!    Medical issues>  She saw PCP- DrHunter on 08/02/17> stable dyspnea, but BP was sl elev w/ palpit & she reported incr stress w/ husb parkinson's dis; on Amlod5 + low sodium diet; she monitors BP carefully at home & BP today = 120/70;  They treated UTI w/ Macrobid & note- try to avoid the Nitrofurantoin due to potential for pulm reaction...  She also declined Prolia for her osteoporosis & will discuss this further w/ DrEllison in April EXAM shows Afeb, VSS, O2sat=96% on RA;  HEENT- neg, Mallampati1;  Chest- clear w/o w/r/r;  Heart- RR w/o m/r/g;  Abd- soft, nontender, neg;  Ext- neg w/o c/c/e;  Neuro- intact w/o focal abn; no  palp adenopathy... IMP/PLAN>>  Laura [name] appears stable from the pulm/ sarcoid standpoint on the  low dose Pred '5mg'$ /d & we discussed a dose adjustment to '10mg'$  Qod going forward w/ rov in 76mo..   ~  Dec 17, 2017:  390moOV & pulmonary follow up visit for sarcoidosis>  LoRoopalast CXR & ACE levels were stable 05/2017 (see above) on Pred '5mg'$ /d but she had a URI superimposed which we treated w/ ZPak, antihist/ Flonase, and she returned to baseline;  She had a UTI treated by DrHunter in 07/2017 and when we checked her in 08/2017 she was clinically stable & asymptomatic so we decided to change her Pred course to '10mg'$  Qod;  She has been on this regimen for 35m35mow & once again reports that she is feeling well, breathing ok, without cough/ sput/ hemoptysis, and w/ chr stable DOE w/ stairs or hills otherw w/o SOB/ CP/ palpit/ etc (she has pressure washed her walkway, does yard work, etcSocial research officer, government. Her biggest issue at present is family related stress w/ Jim's parkinson's, some memory issues, he's rather dependent on her, sister's issues, etc (prev low dose benzo was stopped by PCP)...  We reviewed the following interval medical notes in Epic>      [facility]  UTI 09/2017, sens Klebs- treated w/ Cipro; Stable BP on Amlod2.5/ Chol on Simva20/ GERD on PPI, Carafte, Zofran;  Had palpit 11/15/17> no CP, sl dizzy, under stress- EKG w/ NSR, one PVC seen, otherw neg; Labs were ok;  Seen 12/12/17 for Wellness check- note reviewed...  We reviewed the following medical problems during today's office visit>      Ophthalmology> Hx uveitis, s/p bilat cat surg, [facility]    Sarcoid> Hx uveitis & abn CXR w/ BHA & perihilar parenchymal scars; she is asymptomatic; baseline CXR w/ adenop, scarring & chr changes; hx ?[place]; f/uCXR 5/16 was stable and ACE=63; she had nodule excised from left elbow 4/16 by Laura [name]=> granulomatous nodule c/w unusual manifestation of Sarcoid, all spec stains were neg... CT Chest 6/17 showed progression of bilat hilar  adenopathy & pulm parenchymal changes w/ mass-like density in RML~4cm, and mass-like density in ant LUL ~4x2cm -- radiology feels this is c/w sarcoid progression & ACE up to 63 => she declined bx & we started on Pred Rx=> CXRs & ACE levels followed carefully & Pred adjusted => currently at '10mg'$  Qod for the last 35mo335mo NOTE: she reminds me that her brother had Sarcoid in his 40s,54sry servere, almost died, [place], H[place], palpit w/ PVCs & PACs on Holter>  She denies CP, palpit, SOB now; advised no caffeine & avoid stress!  Currently on Amlod2.5 & BP=122/74    Chol> on Simva20 & FishOil; last FLP 11/16 was on Simva40 & showed TChol 161, TG 146, HDL 44, LDL 87;  PCP will f/u Lipid Profile soon...    GI> GERD, spasm, IBS, Polyp, +FamHx colon ca in sis> on Nexium20Bid, Carafate1GmQhs, & Zofran prn; followed by Laura [name]- she remains asymptomatic w/o abd pain, n/v, c/d, blood seen...       ADDENDUM> she had colonoscopy 01/17/16> + for divertics, hemorrhoids, and several polyps- 4 to 6mm 54me & Path= sessile serrated adenomas & f/u suggested 3 yrs.    UTIs> she reports no prob on Cranberry juice but had one recurrent UTI 4/16- Cipro called in & symptoms resolved;  Then  [place] UTI 3/19 also sens Cipro;  Try to avoid Nitrofurantoin due to her pulm dis...    DJD, LBP, L1 compression w/ kyphoplasty, Osteop> on calcium, MVI, VitD & Tramadol50; she fell 2/14 w/ L1 compression & kyphoplasty in [place]; BMD 2/16 w/ Tscore-1.8 & we rec ALENDRONATE70/wk=> she stopped & Endocrine consult DrEllison rec Prolia rx...    Anxiety> she has a lot of family issues, prev on Xanax 0.25 prn but off now...  EXAM shows Afeb, VSS, O2sat=97% on RA;  HEENT- neg, Mallampati1;  Chest- clear w/o w/r/r;  Heart- RR w/o m/r/g;  Abd- soft, nontender, neg;  Ext- neg w/o c/c/e;  Neuro- intact w/o focal abn; no palp adenopathy...  CXR 12/17/17 (independently reviewed by me in the PACS system) showed perhaps sl progression of the  perihilar scarring from prev films, norm heart size, no pneumonia or effusions, upper lumbar augmentation noted...   LABS 11/15/17>  Chems- ok w/ K=4.2, BS=110, Cr=0.79;  CBC- ok w/ Hg=16.0, WBC=10.3;  TSH=1.33...   LAB 12/17/17>  ACE=51 IMP/PLAN>>  I am disappointed that her CXR looks sl worse w/ ACE still in the norm range but this correlates to her switching to Pred10Qod- therefore discussed return to daily dosing at 10-5 for June, then '5mg'$ /d thereafter w/ ROV in 54mo.. She will call for any issues in the interim...           Problem List:  Hx of UVEITIS (ICD-364.3) [facility] given [facility] in [facility]... s/p right cataract surg 2/09 and no active uveitis seen... ~  4/11:  she tells me she was seen 10/10 & doing well, may need left cataract surg soon. ~  [facility]  DrDickinson noted hx uveitis- steroid responder, s/p bilat cat surg w/ lens replacement, epiretinal membrane & post vitreous detachments...   R/O SARCOIDOSIS (ICD-135) - [facility]seq f/u CXR w/ CTChest showing bilat hilar adenopathy, and w/u showing neg PPD, ACE=53, sed=13... prev PFT's 1/08 showed FVC 3.75 (111%), FEV1 2.74 (102%), %1sec=73, mid-flows=70%... being followed w/ serial films and "watchful waiting"... there is a family hx of sarcoidosis in one brother (severe dis w/ neuropathy). ~   we had a f/u CXR in Feb09- streaky opacity right mid lung zone, similar to 2008...  ~  CT Chest 7/08 & 6/09 showed mediastinal > hilar adenopathy some w/ calcif (generally smaller than 1/08), area of atx/ scarring right mid zone (infer RUL) & scat <1cm nodules in lower zones w/o change... ~  CXR 2/10 showed bilat hilar prominence & scarring appears the same- NAD... labs all WNL w/ ACE=56. ~  CXR 4/11 showed mild bilat hilar prominence, scarring, NAD- osteopenia & DJD in TSpine... ACE= 37. ~  CXR 4/12 showed stable bilat hilar prominence, scarring, NAD..[name] ~   CXR 11/13 showed similar changes- irreg nodular changes in right suprahilar region, scarring, stable adenopathy, mild atherosclerotic calcif, osteopenia... ~  CXR 3/14 showed normal heart size, atherosclerotic Ao, chr scarring from underlying sarcoid, NAD..[name] ~  CXR 1/15 showed norm heart size, areas of scarring appear unchanged, prom hilae w/o change- stable, NAD ~  PFTs 5/15 showed just some sm airways dis> FVC=2.65 (78%), FEV1=1.86 (72%); %1sec=70; mid-flows=55% predicted... After bronchodil her FEV1 improved 6%... Lung Volumes & DLCO are wnl. 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 ~  5/16: CXR 5/16 showed stable BHA & scarring on right, NAD, no change... LABS 5/16:  ACE=56, stable. ~  2017>  +FluA illness 09/2015 w/ ER eval & CXR showing progressive abn;  CT Chest 12/2015 confirmed progression of BHA & LUL/RML parenchymal densities felt to be c/w active Sarcoid, ACE=62;  Pt wanted to avoid Bx if poss & we decided on a course of Pred 40-30-20 Q2wk taper w/ ROV in 6wks for f/u=> ACE improved to 23 and we weaned Pred slowly... ~  12/17: Pt perceives several issues w2/ the Pred (now down to '10mg'$ /d) so we will switch to Medrol'8mg'$  tabs and continue slow weaning.[name]  PALPITATIONS >>  Hx MITRAL VALVE PROLAPSE >> on ASA '81mg'$ /d... clinical dx w/ 2DEcho 1999 showing flat closure but no frank prolapse... ~  2/16: she had a cardiac eval by Laura [name] for dyspnea> 2DEcho was neg, wnl; stress testing was neg- w/o ischemia; noted to have poor functional capacity & is way too sedentary; she had some palpit & monitor revealed PVCs & PACs- didn't want meds therefore asked to avoid caffeine 7 stress...  HYPERCHOLESTEROLEMIA (ICD-272.0) - on SIMVASTATIN '40mg'$ /d now... ~  [place] 1/08 on Lip10 showed TChol 183, TG 122, HDL 45, LDL 114...  ~  [place] 2/09 on Lip10 showed TChol 183, TG 189,  HDL 39, LDL 107... continue diet & change to Simva40.  ~  FLP 5/09 on Simva40 showed TChol 185, TG 163, HDL 36, LDL 117... she wants to stay on Simva40. ~  FLP 2/10 showed TChol 176, Tg 160, HDL 39, LDL 105... rec> continue same. ~  FLP 4/11 showed TChol 160, TG 131, HDL 41, LDL 93 ~  FLP 4/12 showed TChol 174, TG 148, HDL 43, LDL 102 ~  FLP 5/13 showed TChol 159, TG 151, HDL 47, LDL 82 ~  FLP 5/14 on Simva40 showed TChol 165, TG 157, HDL 41, LDL 93 ~  FLP 11/15 on Simva40 showed TChol 169, TG 135, HDL 41, LDL 101 ~  FLP 11/16 on Simva40 showed TChol 161, TG 146, HDL 44, LDL 87  GERD (ICD-530.81) - on NEXIUM '40mg'$ /d & ZANTAC '300mg'$ Qhs... ~  EGD 6/07 w/ 3cmHH, stricture, gastitis (HPylori neg)... I offered to change her Nexium to a generic but she declined- "DrPatterson told me never to change this med"... she notes occas nocturnal reflux symptoms. ~  5/12: She had GI eval by DrPatterson 5/12> chr GERD, hx 3cmHH, prev stricture dilated, hx colon polyps, etc> c/o incr reflux symptoms, bloating, pressure despite her Nexium40AM & Zantac300PM;  Nexium was increased to Bid, Carafate added Qid & she had EGD 5/12 showing mod gastritis w/ bx= chr inflamm, neg HPylori... ~  CTAbd 5/12 showed mult parenchymal nodules at lung bases (some fractionally larger), calcif hilar nodes bilat, no renal lesions (?left upper pole mass seen on sonar?). ~  EGD 3/13 by DrPatterson showed 3cmHH, esophagitis, prob "pill espohagitis", dilated... Symptoms resolved w/ Carafate, Nexium, Zantac, Xylocaine... ~  5/14: on Nexium40Bid, Zantac300HS, CarafateQid prn, & Zofran prn; she remains asymptomatic w/o abd pain, n/v, c/d, blood seen... ~  11/15: on Nexium40, Carafate1GmQhs, & Zofran prn; she has seen Laura [name] & remains asymptomatic on these meds- w/o abd pain, n/v, c/d, blood seen...  IRRITABLE BOWEL SYNDROME (ICD-564.1) COLONIC POLYPS, HX OF (ICD-V12.72)  ~  Colonoscopy 6/07 by DrPatterson showing divertics & polyps  (adenomatous)... f/u planned 67yr... ~  She had f/u colonoscopy 5/12 showing mod divertics, sessile polyp in cecum= serrated  adenoma w/ f/u planned 3-18yr...  UTI'S, CHRONIC (ICD-599.0) - eval by DrOttelin for urology... now off the Macrodantin and using cranberry juice without recurrent UTI, no problems...  DEGENERATIVE JOINT DISEASE (ICD-715.90) - eval by DrDalldorf w/ mod DJD in knees & hx of torn left meniscus... 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 overall improved now. ~  11/13:  TRAMADOL '50mg'$  Tid refilled for prn use... ~  [facility] right & left knee DJD, right ankle tendonitis, right plantar fasciitis...  ~  4/16: she had right elbow surg by Laura [name]> mult granulomas on path c/w sarcoid nodule...  LOW BACK PAIN, CHRONIC (ICD-724.2) ~  2/14: she fell while getting up at night & hit her back w/ severe pain & L[facility] compression fx- [facility] & good pain relief...  OSTEOPENIA (ICD-733.90) - on Calcium, MVI, Vit D... ~  BMD in 2005 showed TScore -1.2 [facility], [facility] ~  BMD 5/09 showed TScores -1.5 in Spine & -1.1 in FemNecks. ~  BMD here 5/11 showed TS[facility].2 [facility], [facility] ~  5/13 & 5/14:  She is due for f/u BMD but wants to wait for now... ~  Labs 11/15 showed VitD= 52... ~  BMD 2/16 revealed Tscore -1.8 in Lspine and Rt FemNeck; we rec FOSAMAX70/wk in light of her prev compression fx=> pt stopped the Bisphos after several months due to reflux symptms and refuses alternative med... ~  She is rec to take Calcium, MVI, VitD supplement, & wt bearing exercise...  ANXIETY (ICD-300.00) >> given ALPRAZOLAM 0.'25mg'$  tabs w/ directions to try 1/2 to 1 tab Tid more regularly...  Hx of BASAL CELL SKIN CANCER - [place]  Health Maintenance: ~  GI:  followed by  DrPatterson/Pyrtle w/ colon as above... ~  GYN:  followed by DrRichardson & doing well by pt's report... [place] [facility] [place] ~  Immunizations:  she gets yearly flu shots;  has PNEUMOVAX in 1998 & repeated 10/11 (age 77;  TDAP given 5/13;  we discussed Shingles vaccine (she had bout of shingles involving her right hip & leg ~age30) & she will decide.    Past Surgical History:  Procedure Laterality Date  . ABDOMINAL HYSTERECTOMY    . BASAL CELL CARCINOMA EXCISION    . CATARACT EXTRACTION Bilateral   . COLONOSCOPY  2012  . MASS EXCISION Left 11/02/2014   Procedure: EXCISION MASS LEFT ELBOW;  Surgeon: GDaryll Brod MD;  [place]  Service: [place]opedics;  Laterality: Left;  . POLYPECTOMY    . UPPER GASTROINTESTINAL ENDOSCOPY    . VESICOVAGINAL FISTULA CLOSURE W/ TAH     "sling" after hystrectomy per patient    Outpatient Encounter Medications as of 12/17/2017  Medication Sig  . amLODipine (NORVASC) 5 MG tablet Take 2.5 mg by mouth daily.  . B Complex Vitamins (VITAMIN-B COMPLEX PO) Take 1 capsule by mouth daily.   . Calcium Carbonate (CALTRATE 600 PO) Take 1 tablet by mouth 2 (two) times daily.    . cholecalciferol (VITAMIN D) 1000 units tablet Take 1,000 Units by mouth daily.  . Esomeprazole Magnesium (NEXIUM 24HR) 20 MG TBEC Take 1 tablet by mouth 2 (two) times daily.  . Glucosamine HCl (GLUCOSAMINE PO) Liquid form - Take by mouth as directed once  daily  . meclizine (ANTIVERT) 25 MG tablet Take 1/2-1 tablet by mouth every 4 hours as needed for dizziness  . Multiple Vitamins-Minerals (MULTIVITAL) tablet Take 1 tablet by mouth daily.    . Omega-3 Fatty Acids (FISH OIL) 1000 MG CAPS Take 1 capsule by mouth daily.    . ondansetron (ZOFRAN) 8 MG tablet 1 tablet by mouth every 6 hours as needed for nausea  . predniSONE (DELTASONE) 5 MG tablet Take 5 mg by mouth daily. 2 tabs every other day  . simvastatin (ZOCOR) 20 MG tablet Take 20 mg by mouth at  bedtime.  . sucralfate (CARAFATE) 1 g tablet Take 1 tablet (1 g total) by mouth 2 (two) times daily. (Patient taking differently: Take 1 g by mouth as needed. )   No facility-administered encounter medications on file as of 12/17/2017.     Allergies  Allergen Reactions  . 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 Amoxicillin-Pot Clavulanate     REACTION: causes diarrhea  . Codeine     REACTION: nausea  . Levaquin [Levofloxacin In D5w]     Had itching w/ intravenous, not sure if reaction was from abx or the need to change the IV.  Does well when taken with Benadryl. Pt states can take orally with no issues   . Tdap [Tetanus-Diphth-Acell Pertussis]     Area raised, warm to touch, tender, swollen, pt felt jittery, nausea and some SOB  . Tape Rash    Blisters skin and removes skin --NEEDS PAPER TAPE    Immunization History  Administered Date(s) Administered  . Influenza Split 05/23/2011, 05/23/2012, 05/04/2013, 05/31/2014  . Influenza Whole 09/11/2009, 05/08/2010  . Influenza, High Dose Seasonal PF 05/02/2016, 05/08/2017  . Influenza-Unspecified 05/25/2015  . Pneumococcal Polysaccharide-23 05/17/2010  . Tdap 11/21/2011   Current Medications, Allergies, Past Medical History, Past Surgical History, Family History, and Social History were reviewed in [employer] record.   Review of Systems         See HPI - all other systems neg except as noted... The patient denies anorexia, fever, weight loss, weight gain, vision loss, decreased hearing, hoarseness, chest pain, syncope, dyspnea on exertion, peripheral edema, prolonged cough, headaches, hemoptysis, abdominal pain, melena, hematochezia, severe indigestion/heartburn, hematuria, incontinence, muscle weakness, suspicious skin lesions, transient blindness, difficulty walking, depression, unusual weight change, abnormal bleeding,  enlarged lymph nodes, and angioedema.     Objective:   Physical Exam    WD, WN, 77 y/o WF in NAD... GENERAL:  Alert & oriented; pleasant & cooperative... HEENT:  [place]/AT, EOM-wnl, PERRLA, EACs-clear, TMs-wnl, NOSE-clear, THROAT-clear & wnl. NECK:  Supple w/ fairROM; no JVD; normal carotid impulses w/o bruits; no thyromegaly or nodules palpated; no lymphadenopathy. CHEST:  Clear to P & A; without wheezes/ rales/ or rhonchi. HEART:  Regular Rhythm; without murmurs/ rubs/ or gallops. ABDOMEN:  Soft & nontender; normal bowel sounds; no organomegaly or masses detected. EXT: without deformities, mild arthritic changes; no varicose veins/ +venous insuffic/ no edema. NEURO:  CN's intact;  no focal neuro deficits... DERM:  No lesions noted; no rash etc...  RADIOLOGY DATA:  Reviewed in the EPIC EMR & discussed w/ the patient...  LABORATORY DATA:  Reviewed in the EPIC EMR & discussed w/ the patient...   Assessment & Plan:    DYSPNEA w/ thorough PULM & CARDIAC eval>> underlying sarcoidosis w/ mild pulm fibrosis &  prev no signs of dis activity- on watchful waiting protocol; Cards eval by Laura [name] was neg as well x PVCs & PACs; she has improved on a gradual exercise program, avoids caffeine & stress...  SARCOID>  Previous CXR/ PFT/ ACE stable & she remains asymptomatic, nodule left elbow excised & path revealed nonnecrotizing granulomata... ~  12/2015>  FluA illness 09/2015 w/ CXR via ER showed some progression of the BHA & parenchymal abnormalities; CT Chest 12/2015 confirmed & ACE=62; We decided to start Rx w/ PRED 40-30-20 Q2wk taper w/ ROV/ in 6wks. ~  02/2016>  Clinically ?sl better w/ a few Pred side effects, ACE improved to 23 & we decided to cut the Pred to 20-10 Qod til ret 6wks... ~  03/2016>  CXR shows sl improvement on the Pred 20-10Qod;  We decided to continue this dose thru Oct & decr to '10mg'$ /d starting in Nov w/ ROV in 78mo ~  06/2016>  LJoraperceives some difficulty w/ the Pred but she is  improved overall & ACE down to 28; we decided to change to MEDROL '8mg'$  tabs taking one Qam for Dec & cut to '8mg'$  alternating w/ '4mg'$  QOD starting Jan2018=> we will recheck her in Feb w/ f/u CTChest due and also due for 241yrecheck BMD ~  11/27/16>   We discussed decreasing her Pred from 10-5 Qod to '5mg'$ /d w/ routine rov in 23m32mo 03/07/17>   With the incr in ACE to 53- we decided to continue the Pred '5mg'$  daily; she is asked to maintain her exercise program etc; she will call prn any resp problems and we plan ROV recheck in 3-40mo22mo~  06/20/17>   LoisReeya an upper resp infection on top of her chronic sarcoidosis- on Pred '5mg'$ /d for the last 22mo 31moo & her CXR is stable and inflamm markers are neg + ACE=42;  We discussed OTC rx w/ Antihist & Flonase + Rx w/ ZPak;  We decided to keep the Pred '5mg'$ /d for her sarcoidosis in light of her CXR changes (likely scarring now) and ACE 42. ~  09/19/17>   Laura Laura Ferraraars stable from the pulm/ sarcoid standpoint on the low dose Pred '5mg'$ /d & we discussed a dose adjustment to '10mg'$  Qod going forward w/ rov in 291mo ~1291mo28/19>   I am disappointed that her CXR looks sl worse w/ ACE still in the norm range but this correlates to her switching to Pred10Qod- therefore discussed return to daily dosing at 10-5 for June, then '5mg'$ /d thereafter w/ ROV in 291mo...4291mo will call for any issues in the interim.   HBP>  DrHunter added Amlodipine 7 BP is back to normal...  CHOL>  Stable on the Simva40 + diet...  GERD>  On Nexium & Carafate, she is currently improved from her prev symptoms...  DJD/ LBP/ Osteopenia>  As noted she remains stable on current meds & exercise program; BMD 2/16 w/ Tscore -1.8 & rec to start FOSAMAX70=> she didn't stick w/ it & was eval by endocrine DrEllison w/ rec for PROLIA- Q22mo... 58mopression fx L1 after fall> she is s/p L1 kyphoplasty...  Other medical issues as noted...   Patient's Medications  New Prescriptions         PREDNISONE '5mg'$  tabs >> dose adjusted  to '10mg'$  alt w/ '5mg'$  for June, then decr to '5mg'$  Qd starting in July til return OV...  Previous Medications   AMLODIPINE (NORVASC) 5 MG TABLET    Take 2.5 mg by mouth daily.  B COMPLEX VITAMINS (VITAMIN-B COMPLEX PO)    Take 1 capsule by mouth daily.    CALCIUM CARBONATE (CALTRATE 600 PO)    Take 1 tablet by mouth 2 (two) times daily.     CHOLECALCIFEROL (VITAMIN D) 1000 UNITS TABLET    Take 1,000 Units by mouth daily.   ESOMEPRAZOLE MAGNESIUM (NEXIUM 24HR) 20 MG TBEC    Take 1 tablet by mouth 2 (two) times daily.   GLUCOSAMINE HCL (GLUCOSAMINE PO)    Liquid form - Take by mouth as directed once daily   MECLIZINE (ANTIVERT) 25 MG TABLET    Take 1/2-1 tablet by mouth every 4 hours as needed for dizziness   MULTIPLE VITAMINS-MINERALS (MULTIVITAL) TABLET    Take 1 tablet by mouth daily.     OMEGA-3 FATTY ACIDS (FISH OIL) 1000 MG CAPS    Take 1 capsule by mouth daily.     ONDANSETRON (ZOFRAN) 8 MG TABLET    1 tablet by mouth every 6 hours as needed for nausea   SIMVASTATIN (ZOCOR) 20 MG TABLET    Take 20 mg by mouth at bedtime.   SUCRALFATE (CARAFATE) 1 G TABLET    Take 1 tablet (1 g total) by mouth 2 (two) times daily.  Modified Medications   No medications on file  Discontinued Medications   No medications on file

## 2017-12-17 NOTE — Patient Instructions (Signed)
Today we updated your med list in our EPIC system...    Continue your current medications the same...  Today we rechecked your CXR & ACE level...    We will contact you w/ the results when available & then we can decide about your Prednisone dose...  Keep up the great work w/ diet & exercise...  Call for any questions...  Let's plan a follow up visit in 3-37mo, sooner if needed for problems.Marland KitchenMarland Kitchen

## 2017-12-19 ENCOUNTER — Other Ambulatory Visit: Payer: Self-pay | Admitting: Pulmonary Disease

## 2017-12-19 LAB — ANGIOTENSIN CONVERTING ENZYME: ANGIOTENSIN-CONVERTING ENZYME: 51 U/L (ref 9–67)

## 2017-12-23 ENCOUNTER — Other Ambulatory Visit: Payer: Self-pay | Admitting: Pulmonary Disease

## 2018-02-19 DIAGNOSIS — H31002 Unspecified chorioretinal scars, left eye: Secondary | ICD-10-CM | POA: Diagnosis not present

## 2018-02-19 DIAGNOSIS — H35341 Macular cyst, hole, or pseudohole, right eye: Secondary | ICD-10-CM | POA: Diagnosis not present

## 2018-02-19 DIAGNOSIS — Z961 Presence of intraocular lens: Secondary | ICD-10-CM | POA: Diagnosis not present

## 2018-02-19 DIAGNOSIS — D869 Sarcoidosis, unspecified: Secondary | ICD-10-CM | POA: Diagnosis not present

## 2018-02-19 DIAGNOSIS — H31091 Other chorioretinal scars, right eye: Secondary | ICD-10-CM | POA: Diagnosis not present

## 2018-02-19 DIAGNOSIS — H43813 Vitreous degeneration, bilateral: Secondary | ICD-10-CM | POA: Diagnosis not present

## 2018-02-19 DIAGNOSIS — H35372 Puckering of macula, left eye: Secondary | ICD-10-CM | POA: Diagnosis not present

## 2018-03-11 ENCOUNTER — Other Ambulatory Visit: Payer: Self-pay | Admitting: Pulmonary Disease

## 2018-03-19 ENCOUNTER — Ambulatory Visit: Payer: Medicare Other | Admitting: Pulmonary Disease

## 2018-03-19 ENCOUNTER — Encounter: Payer: Self-pay | Admitting: Pulmonary Disease

## 2018-03-19 VITALS — BP 126/64 | HR 84 | Temp 97.4°F | Ht 65.0 in | Wt 171.2 lb

## 2018-03-19 DIAGNOSIS — K219 Gastro-esophageal reflux disease without esophagitis: Secondary | ICD-10-CM

## 2018-03-19 DIAGNOSIS — M8000XD Age-related osteoporosis with current pathological fracture, unspecified site, subsequent encounter for fracture with routine healing: Secondary | ICD-10-CM | POA: Diagnosis not present

## 2018-03-19 DIAGNOSIS — D869 Sarcoidosis, unspecified: Secondary | ICD-10-CM

## 2018-03-19 DIAGNOSIS — K222 Esophageal obstruction: Secondary | ICD-10-CM

## 2018-03-19 DIAGNOSIS — M159 Polyosteoarthritis, unspecified: Secondary | ICD-10-CM

## 2018-03-19 DIAGNOSIS — G8929 Other chronic pain: Secondary | ICD-10-CM

## 2018-03-19 DIAGNOSIS — M150 Primary generalized (osteo)arthritis: Secondary | ICD-10-CM | POA: Diagnosis not present

## 2018-03-19 DIAGNOSIS — M545 Low back pain: Secondary | ICD-10-CM | POA: Diagnosis not present

## 2018-03-19 DIAGNOSIS — R002 Palpitations: Secondary | ICD-10-CM

## 2018-03-19 DIAGNOSIS — F411 Generalized anxiety disorder: Secondary | ICD-10-CM

## 2018-03-19 DIAGNOSIS — S32010D Wedge compression fracture of first lumbar vertebra, subsequent encounter for fracture with routine healing: Secondary | ICD-10-CM

## 2018-03-19 DIAGNOSIS — I10 Essential (primary) hypertension: Secondary | ICD-10-CM | POA: Diagnosis not present

## 2018-03-19 NOTE — Patient Instructions (Signed)
Today we updated your med list in our EPIC system...    Continue your current medications the same...  We decided to keep the Prednisone at 5mg  per day for now...  Continue your exercise program!!!  Call for any questions...  Let's plan a follow up visit in 3-66months, sooner if needed for problems.Marland KitchenMarland Kitchen

## 2018-03-19 NOTE — Progress Notes (Addendum)
Subjective:    Patient ID: Laura [name], female    DOB: [birth year], 77 y.o.   MRN: [id number]  HPI 77 y/o WF here for a follow up visit... she has prob Sarcoidosis w/ hx Uveitis and BHA + parenchymal dis on CXR => Pred therapy started 12/2015 due to progressive CXT/ CT abnormality...   ~  SEE PREV EPIC NOTES FOR OLDER DATA >>   LABS 5/13:  FLP- at goals on Simva40;  Chems- wnl;  CBC- wnl;  TSH=1.61;  VitD=47  ADDENDUM>> she received TDAP w/ local reaction req antihist & topical Rx...  CXR 11/13 showed similar changes- irreg nodular changes in right suprahilar region, scarring, stable adenopathy, mild atherosclerotic calcif, osteopenia...  ~  Laura [name] reports that in Feb2014 she fell while getting up at night & hit her back w/ severe pain & L1 compression fx- adm to [facility] for 2d w/ kyphoplasty & told to have LLLpneumonia treated w/ Levaquin.   CXR 3/14 showed normal heart size, atherosclerotic Ao, chr scarring from underlying sarcoid, NAD...  LABS 5/14:  FLP- at goals on Simva40;  Chems- wnl;  CBC- wnl;  TSH=1.74;  VitD=52... ~   Seen by DrTaylor for Cards 3/15> 2DEcho showed norm LVF, norm valves, no signs of pulmHTN; subseq stress testing showed extreme dyspnea w/ exercise (poor functional capacity), no ischemia; she has been encouraged to join silver sneakers and incr her exercise program...   LABS 1/15:  Chems- ok x K=3.4;  CBC- wnl;  TSH=2.28;  Mag=2.0.Marland KitchenMarland Kitchen  CXR 1/15 showed norm heart size, areas of scarring appear unchanged, prom hilae w/o change- stable, NAD.Marland KitchenMarland Kitchen   EKG 3/15 showed NSR, rate79, wnl, NAD...  Stress Test 3/15 showed poor exercise capacity, no ischemia...  2DEcho 3/15 showed normal LVF w/ EF=55-60%, no regional wall motion abn, normal valves, no evid for pulmHTN...  PFTs 5/15 showed just some sm airways dis> FVC=2.65 (78%), FEV1=1.86 (72%); %1sec=70; mid-flows=55% predicted... After bronchodil her FEV1 improved 6%... Lung Volumes & DLCO are wnl...    LABS 5/15:  ACE level = 55     LABS 11/15:  FLP- looks good on Simva40, needs to lose wt;  Chems- wnl;  CBC- wnl;  TSH=2.19;  VitD=52... ~  April 2016:  Laura [name] had left elbow surg for a nodule & excision by [name] showed path=numerous nonnecrotizing granulomas, spec stains neg & this is c/w an unusual manifestation of her Sarcoid;   CXR 5/16 showed stable BHA & scarring on right, NAD, no change...  LABS 5/16:  ACE=56, stable...  LABS 06/07/16>  FLP- all parameters at goals on Simva40;  Chems- wnl;  CBC- wnl;  TSH=1.86;  VitD=52;  ACE=63     ~  December 26, 2015:  6-65moROberlinremains asymptomatic, feeling well, and denies CP/ palpit. Cough/ phlegm/ hemoptysis, SOB, edema, etc;  She tells me that she got the Flu at the end of March noting HA, sore throat, severe cough w/o much sput, low grade temp, aching & sore, etc & was seen in the ER where CXR showed bilat hilar adenopathy & increased curvilinear thickening on the right but no acute process;  Flu panel was POS for TypeA flu;  She had f/u OV w/ TP shortly thereafter & given ZPak, Tessalon, Mucinex, Delsym, fluids;  She notes that it took her 8wks to fully resolve & get her energy back!  We reviewed the following medical problems during today's office visit >>     Ophthalmology> Hx uveitis, s/p bilat  cat surg, followed at [facility]...    Sarcoid> Hx uveitis & abn CXR w/ BHA, she is asymptomatic; baseline CXR w/ adenop, scarring & chr changes; hx ?LLLpneum 0/94 in [place] treated w/ Levaquin; last CXR 5/16 was stable and last ACE=63; she had nodule excised from left elbow 4/16 by DrKuzma=> granulomatous nodule c/w unusual manifestation of Sarcoid, all spec stains were neg...    HxMVP, palpit w/ PVCs & PACs on Holter> on ASA81;  She denies CP, palpit, SOB now; advised no caffeine & avoid stress!    Chol> on Simva40 & FishOil; last FLP 11/15 showed TChol 169, TG 135, HDL 41, LDL 101; reviewed diet & wt reduction...    GI> GERD, IBS, Polyp> on  Prilosec40Bid, Carafate1GmQhs, & Zofran prn; followed by DrPyrtle- she remains asymptomatic w/o abd pain, n/v, c/d, blood seen...       ADDENDUM> she had colonoscopy 01/17/16> + for divertics, hemorrhoids, and several polyps- 4 to 45m size & Path= sessile serrated adenomas & f/u suggested 3 yrs.    UTIs> she reports no prob on Cranberry juice but had one recurrent UTI 4/16- Cipro cqalled in & symptoms resolved....    DJD, LBP, L1 compression w/ kyphoplasty, Osteop> on calcium, MVI, VitD & Tramadol50; she fell 2/14 w/ L1 compression & [place] [employer] BMD 2/16 w/ Tscore-1.8 & we rec ALENDRONATE70/wk...    Anxiety> she has a lot of family support, doesn't require meds... EXAM shows Afeb, VSS, O2sat=97% on RA;  HEENT- neg, Mallampati1;  Chest- clear w/o w/r/r;  Heart- RR w/o m/r/g;  Abd- soft, nontender, neg;  Ext- neg w/o c/c/e;  Neuro- intact w/o focal abn...  CXR 10/16/15> I have reviewed this film & compared to prev images>  bilat hilar adenopathy & increased opac in left superior hilum and right lat hilar region w/ extension into ant RUL (similar to 11/2014 films but progressed from previous CXRs) => we will proceed w/ f/u CT Chest...  LABS 10/16/15>  Flu panel showed POS FluA;  Neg FluB and H1N1...   LABS 12/2015>  ACE=62 (nl=8-52);  VitD=51;  BMet- ok x BS=131, Cr=0.74, Ca=9.4..Marland KitchenMarland Kitchen CT Chest w/ contrast 01/06/16>  Norm heart size, aortic & coronary atherosclerosis, part calcif mediastinal adenopathy (unchanged from 2009), progression of bilat hilar adenopathy & pulm parenchymal changes w/ mass-like density in RML~4cm, and mass-like density in ant LUL ~4x2cm -- radiology feels this is c/w sarcoid progression.   Full PFTs 01/06/16>  FVC=2.66 (80%), FEV1=1.82 (72%), %1sec=68, mid-flows reduced at 53% predicted; post bronchodil FEV1 improved 5% to 1.92;  TLC=5.57 (98%), RV=2.72 (108%), RV/TLC=49%;  DLCO=70% pred (DL/VA=96%). IMP/PLAN>>  Concern for active sarcoidosis w/ the recent CXR and CT changes;  she has never had a biopsy due to remote presentation w/ BHA and uveitis and the fact that she has been devoid of resp symptoms, and prev had neg ACE levels (ie- no signs of dis activity);  Her recent CXR changes (progression), sl elev ACE in the low 60s, suggests some activity yet she remains stoic & asymptomatic;  She would like to proceed w/o lung Bx if poss & I discussed trial PREDNISONE w/ short term f/u to check ACE & CXR (if not improved then we will address need for lung bx at that time);  REC to start Pred40/d x2wks, 30/d x2wks, then 20/d til return in about 6wks time...   ~  February 22, 2016:  262moOV & f/u sarcoid> In Jun2017 we did CTChest showing norm heart size, aortic & coronary  atherosclerosis, part calcif mediastinal adenopathy (unchanged from 2009), progression of bilat hilar adenopathy & pulm parenchymal changes w/ mass-like density in RML~4cm, and mass-like density in ant LUL ~4x2cm -- radiology feels this is c/w sarcoid progression;  ACE level= 62;  We discussed poss Bx but elected to start Pred therapy w/ 40-30-20 Q2wks and she returns today on Pred20/d noting various symptoms related to the Pred she feels eg- Ha, sweating, trembling, incr HR, & short tempered; these symptoms have diminished w/ decr in the dose; she also notes SOB, can't get a DB, & denies CP- but she has stepped up her walking program & doing better now;  We discussed checking ACE level & weaning Pred further based on this result...    EXAM shows Afeb, VSS, O2sat=97% on RA;  HEENT- neg, Mallampati1;  Chest- clear w/o w/r/r;  Heart- RR w/o m/r/g;  Abd- soft, nontender, neg;  Ext- neg w/o c/c/e;  Neuro- intact w/o focal abn...  LABS 02/2016>  BMet- ok w/ BS=100, A1c= 6.7, Cr=0.81, K=3.9;  ACE=23... IMP/PLAN>>  Laura [name] seems somewhat improved on the Pred & ACE is down to 23;  I suggest that she keep the Pred20/d for another 2wks then cut it to 64m alt w/ 163mQod til return in 6 wks time...  ~  April 04, 2016:  6wk ROV &  f/u sarcoid>  She reports stable on the Pred 20-10 Qod for the past month & doing better on the lower doses, BS better & wt stable at 170#- we reviewed low carb, low sodium diet;  She denies CP, SOB, cough, sput, hemoptysis, etc...    EXAM shows Afeb, VSS, O2sat=97% on RA;  HEENT- neg, Mallampati1;  Chest- clear w/o w/r/r;  Heart- RR w/o m/r/g;  Abd- soft, nontender, neg;  Ext- neg w/o c/c/e;  Neuro- intact w/o focal abn...  CXR 04/04/16>  Stable heart size, mild atherosclerosis of Ao, BHA & perihilar opacities sl decreased from prev films, no new airspace dis... IMP/PLAN>>  We decided to continue the Pred 2050mlt w/ 37m40md thru Oct & then decr to 37mg61mtarting in Nov until her f/u visit in 36mo w71mou CXR & Labs...  ~  June 26, 2016:  36mo RO736moLois reLillianahs on Pred 37mg/d 67mthe last month- she is still noting mult somatic complaints and blames the Pred=> we discussed change to MEDROL 8mg tabs76m see if she tolerates this better; she tells me she's been exercising 3d/wk at the gym & doing satis, no cough/ sputum, and dyspnea improving; she notes occas palpit "fluttering" and more aware of heart beat- I have tried to get her to take the Alpraz0.25mg tabs42m1/2 to1 tab Tid more regularly; we will recheck labs today, & she will need f/u CT Chest in early 2018 (16yr f/u sc45yr..    EXAM shows Afeb, VSS, O2sat=97% on RA;  HEENT- neg, Mallampati1;  Chest- clear w/o w/r/r;  Heart- RR w/o m/r/g;  Abd- soft, nontender, neg;  Ext- neg w/o c/c/e;  Neuro- intact w/o focal abn...  LABS 06/26/16> Chems- ok x BS=117;  ACE=28 IMP/PLAN>>  Ezme perceiJarynsome difficulty w/ the Pred but she is improved overall & ACE down to 28; we decided to change to MEDROL 8mg tabs ta20mgone Qam for Dec & cut to 8mg alternat83m w/ 4mg QOD start48m Jan2018=> we will recheck her in Feb w/ f/u CTChest due and also due for 69yr recheck BM18yr Note: >50% of this 15min visit was66mnt in  counseling and coordination of care.  ~  August 28, 2016:  68moROV & Kambree is improved- no new complaints or concerns, she has maintained on Pred 10-5 Qod for her sarcoidosis;  She did not tolerate MEDROL saying that "it messed up my tummy" w/ cramps and diarrhea which resolved after switch to Pred...she denies cough, sput, SOB, CP, etc- no f/c/s... She is due for f/u CXR/ Labs today...    EXAM shows Afeb, VSS, O2sat=97% on RA;  HEENT- neg, Mallampati1;  Chest- clear w/o w/r/r;  Heart- RR w/o m/r/g;  Abd- soft, nontender, neg;  Ext- neg w/o c/c/e;  Neuro- intact w/o focal abn...  CXR 08/28/16 (independently reviewed by me in the PACS system) showed norm heart size, aortic atherosclerosis, similar BHA & incr markings unchanged from prev (by my review the markings are better than 09/2015 & similar to 9/17).   LABS 08/28/16>  Chems- ok x K=3.4, BS is wnl at 94, LFTs wnl, A1c=6.4,  ACE level = 24 => ok to cut Pred to 593mpo Qd... IMP/PLAN>>  We will decr her Pred to 65m465maily, asked to maintain her diet & exercise program, ROV in 70mo67moonsider CT f/u...  ~  Nov 27, 2016:  70mo 70mo& f/u Sarcoidosis> Laura [name] Gentryrts that her breathing is good & she has no new complaints or concerns, she is exercising in a gym for 1H 3d/wk, doing zoomba etc; current Pred dose is 10mg 37mw/ 65mg Qo74mnd she confirms- no cough, sput, or hemoptysis; her SOB/DOE is stable w/ no difficulty w/ ADLs but notes trouble w/ inclines and stairs; she is also under stress from mult fronts-- Jim's parkinsons & some mental changes, sisters Louise Barbaraann Share moBertram Millard to CarolinWalt DisneysepeNortheast UtilitiesisJakiyarful & has a support group)... NOTE: she reminds me that her brother had Sarcoid in his 40s, ve69sservere, almost died, [place] w/ PCP-DrHunter 10/18/16> establish care, hx Sarcoid, HL(on Simva40), DM (A1c=6.4 on diet), GERD (on Nexium Bid), osteoporosis (referred to endocrine)...    She saw DrEllison 11/19/16 for osteoporosis> [facility] & stopped due to arthralgias, BMD 08/2016 showed lowest Tscore= -1.7 in RFN w/ incr risk;  REC to prevent falls, Ca 1200mg/d,34mD 400u/d, & start Prolia 2x/yr (SPE- neg, TSH-0.79, VitD-69, PTH=wnl at 25)...    EXAM shows Afeb, VSS, O2sat=100% on RA;  HEENT- neg, Mallampati1;  Chest- clear w/o w/r/r;  Heart- RR w/o m/r/g;  Abd- soft, nontender, neg;  Ext- neg w/o c/c/e;  Neuro- intact w/o focal abn... IMP/PLAN>>  We discussed decreasing her Pred from 10-5 Qod to 65mg/d w/62mutine rov in 70mo; I al20moncouraged her use of the Prolia for her osteopenia + hx compression fx & need for Pred rx...  ~  March 07, 2017:  70mo ROV & 46mo visit Nakeya was doAlexianawell- exercising at gym & w/ zoomba, asymptomatic from the Sarcoid standpoint on Pred 10 alt w/ 5 qod- so we decided to decr to 65mg daily..71mhe reports stable on the Pred 65mg/d over t43mlast 70mo & notes f56mo side effects like sweating, irritability, etc; her breathing is good- no cough/ sputum/ hemoptysis; DOE is similar noted esp w/ inclines & stairs, no worse, no CP etc...     We reviewed the following medical problems during today's office visit >>  Ophthalmology> Hx uveitis, s/p bilat cat surg, followed at [facility]...    Sarcoid> Hx uveitis & abn CXR w/ BHA, she is asymptomatic; baseline CXR w/ adenop, scarring & chr changes; hx ?LLLpneum 3/97 in [place] treated w/ Levaquin; f/uCXR 5/16 was stable and ACE=63; she had nodule excised from left elbow 4/16 by DrKuzma=> granulomatous nodule c/w unusual manifestation of Sarcoid, all spec stains were neg... CT Chest 6/17 showed progression of bilat hilar adenopathy & pulm parenchymal changes w/ mass-like density in RML~4cm, and mass-like density in ant LUL ~4x2cm -- radiology feels this is c/w sarcoid progression & ACE up to 63 => she declined bx & we started on Pred Rx=> CXR & ACE improved as we wean the Pred... NOTE: she reminds me that her brother had Sarcoid in  his 29s, very servere, almost died, Rx in [place]    HxMVP, palpit w/ PVCs & PACs on Holter> on ASA81;  She denies CP, palpit, SOB now; advised no caffeine & avoid stress!    Chol> on Simva40 & FishOil; last FLP 11/16 showed TChol 161, TG 146, HDL 44, LDL 87; reviewed diet & wt reduction...    GI> GERD, spasm, IBS, Polyp, +FamHx colon ca in sis> on Nexium200Bid, Carafate1GmQhs, & Zofran prn; followed by DrPyrtle- she remains asymptomatic w/o abd pain, n/v, c/d, blood seen...       ADDENDUM> she had colonoscopy 01/17/16> + for divertics, hemorrhoids, and several polyps- 4 to 73m size & Path= sessile serrated adenomas & f/u suggested 3 yrs.    UTIs> she reports no prob on Cranberry juice but had one recurrent UTI 4/16- Cipro cqalled in & symptoms resolved....    DJD, LBP, L1 compression w/ kyphoplasty, Osteop> on calcium, MVI, VitD & Tramadol50; she fell 2/14 w/ L1 compression [place] w/ Tscore-1.8 & we rec ALENDRONATE70/wk=> she stopped & Endocrine consult DrEllison rec Prolia Q661mo.    Anxiety> she has a lot of family issues, on Xanax 0.25 prn... EXAM shows Afeb, VSS, O2sat=97% on RA;  HEENT- neg, Mallampati1;  Chest- clear w/o w/r/r;  Heart- RR w/o m/r/g;  Abd- soft, nontender, neg;  Ext- neg w/o c/c/e;  Neuro- intact w/o focal abn...  LABS 03/07/17>  ACE level = 53 (up from 24-28 range, but still wnl w/ norm range=9/67);  BMet- ok x BS=122;  CBC- OK w/ Hg=16.2, WBC=9.1 IMP/PLAN>>  With the incr in ACE to 53- we decided to continue the Pred 12m712maily; she is asked to maintain her exercise program etc; she will call prn any resp problems and we plan ROV recheck in 3-82mo482mo ~  June 20, 2017:  3-82mo 70mo&Aransasremained stable on the Pred 12mg/d82mor the last 53mo- A253momo ago27moped up from 24=>53);  She reports an upper resp infection over the last wk w/ sore throat, sinus congestion, HA, clear drainage, watery eyes, & "not feeling well" she says, and "my eyes are  weak"; her temp was 98 which she notes is one degree of fever for her, denies chills/ sweats; she notes dry cough but denies chest congestion/ tightness/ wheezing...    Sarcoid> Hx uveitis & abn CXR w/ BHA, she is asymptomatic; baseline CXR w/ adenop, scarring & chr changes; [place]; f/uCXR 5/16 was stable and ACE=63; she had nodule excised from left elbow 4/16 by DrKuzma=> granulomatous nodule c/w unusual manifestation of Sarcoid, all spec stains were neg... CT Chest 6/17 showed progression of  bilat hilar adenopathy & pulm parenchymal changes w/ mass-like density in RML~4cm, and mass-like density in ant LUL ~4x2cm -- radiology feels this is c/w sarcoid progression & ACE up to 63 => she declined bx & we started on Pred Rx=> CXR & ACE improved as we wean the Pred...  NOTE: she reminds me that her brother had Sarcoid in his 2s, very servere, almost died, Rx in [place] shows Afeb, VSS, O2sat=97% on RA;  HEENT- neg, Mallampati1;  Chest- clear w/o w/r/r;  Heart- RR w/o m/r/g;  Abd- soft, nontender, neg;  Ext- neg w/o c/c/e;  Neuro- intact w/o focal abn; no palp adenopathy...  CXR 06/20/17 (independently reviewed by me in the PACS system) showed chr hilar enlargement & architectural distortion stable since 2017, incr interstitial markings are similarly stable, scoliosis w/ upper lumbar augmnentation...  LABS 06/04/17 by DrHunter>  Chems- wnl x BS=116;  CBC- wnl w/ Hg=16.1;  TSH=1.29...  LABS 06/20/17>  ACE=42,  Sed=8,  CRP=0.3 IMP/PLAN>>  Laura [name] has an upper resp infection on top of her chronic sarcoidosis- on Pred 32m/d for the last 668mor so & her CXR is stable and inflamm markers are neg + ACE=42;  We discussed OTC rx w/ Antihist & Flonase + Rx w/ ZPak;  We decided to keep the Pred 24m37m for her sarcoidosis in light of her CXR changes (likely scarring now) and ACE 42...   ~  September 19, 2017:  90mo724mo & f/u sarcoid>  During her last OV 05/2017 Laura [name] a  URI superimposed on her chr sarcoid; CXR showed stable chr hilar enlargement & incr interstitial markings; ACE=42 on Pred24mg/13m& we decided to treat w/ ZPak + Antihist/ Flonase;  She responded to the Zithromax & ret to baseline;  Currently doing satis- feels at baseline & doing well overall, notes DOE w/ stairs & hills but exercising 3d/wk at gym, denies cough/ sput/ hemoptysis, denies f/c/s, denies CP/ palpit/ edema...     Sarcoid> Hx uveitis & abn CXR w/ BHA, she was mostly asymptomatic; baseline CXR w/ adenop, scarring & chr changes; [place]; f/uCXR 5/16 was stable and ACE=63; she had nodule excised from left elbow 4/16 by DrKuzma=> granulomatous nodule c/w unusual manifestation of Sarcoid, all spec stains were neg... CT Chest 6/17 showed progression of bilat hilar adenopathy & pulm parenchymal changes w/ mass-like density in RML~4cm, and mass-like density in ant LUL ~4x2cm -- radiology feels this is c/w sarcoid progression & ACE up to 63 => she declined bx & we started on Pred Rx=> CXR & ACE improved as we wean the Pred...  NOTE: she reminds me that her brother had Sarcoid in his 40s, 45sy servere, almost died, [place] had Wegener's!    Medical issues>  She saw PCP- DrHunter on 08/02/17> stable dyspnea, but BP was sl elev w/ palpit & she reported incr stress w/ husb parkinson's dis; on Amlod5 + low sodium diet; she monitors BP carefully at home & BP today = 120/70;  They treated UTI w/ Macrobid & note- try to avoid the Nitrofurantoin due to potential for pulm reaction...  She also declined Prolia for her osteoporosis & will discuss this further w/ DrEllison in April EXAM shows Afeb, VSS, O2sat=96% on RA;  HEENT- neg, Mallampati1;  Chest- clear w/o w/r/r;  Heart- RR w/o m/r/g;  Abd- soft, nontender, neg;  Ext- neg w/o c/c/e;  Neuro- intact w/o focal abn; no  palp adenopathy... IMP/PLAN>>  Laura [name] appears stable from the pulm/ sarcoid standpoint on the  low dose Pred 76m/d & we discussed a dose adjustment to 110mQod going forward w/ rov in 25m26mo  ~  Dec 17, 2017:  25mo66mo & pulmonary follow up visit for sarcoidosis>  LoisTylerst CXR & ACE levels were stable 05/2017 (see above) on Pred 5mg/69mut she had a URI superimposed which we treated w/ ZPak, antihist/ Flonase, and she returned to baseline;  She had a UTI treated by DrHunter in 07/2017 and when we checked her in 08/2017 she was clinically stable & asymptomatic so we decided to change her Pred course to 10mg 59m  She has been on this regimen for 25mo no39moonce again reports that she is feeling well, breathing ok, without cough/ sput/ hemoptysis, and w/ chr stable DOE w/ stairs or hills otherw w/o SOB/ CP/ palpit/ etc (she has pressure washed her walkway, does yard work, etc)...Social research officer, governmentr biggest issue at present is family related stress w/ Jim's parkinson's, some memory issues, he's rather dependent on her, sister's issues, etc (prev low dose benzo was stopped by PCP)...  We reviewed the following interval medical notes in Epic>      [facility]  UTI 09/2017, sens Klebs- treated w/ Cipro; Stable BP on Amlod2.5/ Chol on Simva20/ GERD on PPI, Carafte, Zofran;  Had palpit 11/15/17> no CP, sl dizzy, under stress- EKG w/ NSR, one PVC seen, otherw neg; Labs were ok;  Seen 12/12/17 for Wellness check- note reviewed...  We reviewed the following medical problems during today's office visit>      Ophthalmology> Hx uveitis, s/p bilat cat surg, [facility] Hx uveitis & abn CXR w/ BHA & perihilar parenchymal scars; she is asymptomatic; baseline CXR w/ adenop, scarring & chr changes; [place]; f/uCXR 5/16 was stable and ACE=63; she had nodule excised from left elbow 4/16 by DrKuzma=> granulomatous nodule c/w unusual manifestation of Sarcoid, all spec stains were neg... CT Chest 6/17 showed progression of bilat hilar  adenopathy & pulm parenchymal changes w/ mass-like density in RML~4cm, and mass-like density in ant LUL ~4x2cm -- radiology feels this is c/w sarcoid progression & ACE up to 63 => she declined bx & we started on Pred Rx=> CXRs & ACE levels followed carefully & Pred adjusted => currently at 10mg Qo54mr the last 25mo...  32mo: she reminds me that her brother had Sarcoid in his 40s, very70srvere, almost died[place] H[place], palpit w/ PVCs & PACs on Holter>  She denies CP, palpit, SOB now; advised no caffeine & avoid stress!  Currently on Amlod2.5 & BP=122/74    Chol> on Simva20 & FishOil; last FLP 11/16 was on Simva40 & showed TChol 161, TG 146, HDL 44, LDL 87;  PCP will f/u Lipid Profile soon...    GI> GERD, spasm, IBS, Polyp, +FamHx colon ca in sis> on Nexium20Bid, Carafate1GmQhs, & Zofran prn; followed by DrPyrtle- she remains asymptomatic w/o abd pain, n/v, c/d, blood seen...       ADDENDUM> she had colonoscopy 01/17/16> + for divertics, hemorrhoids, and several polyps- 4 to 6mm size 825math= sessile serrated adenomas & f/u suggested 3 yrs.    UTIs> she reports no prob on Cranberry juice but had one recurrent UTI 4/16- Cipro called in & symptoms resolved;  Then [employer]  UTI 3/19 also sens Cipro;  Try to avoid Nitrofurantoin due to her pulm dis...    DJD, LBP, L1 compression w/ kyphoplasty, Osteop> on calcium, MVI, VitD & Tramadol50; she fell 2/14 w/ L1 compression & kyphoplasty in [place]; BMD 2/16 w/ Tscore-1.8 & we rec ALENDRONATE70/wk=> she stopped & Endocrine consult DrEllison rec Prolia rx...    Anxiety> she has a lot of family issues, prev on Xanax 0.25 prn but off now...  EXAM shows Afeb, VSS, O2sat=97% on RA;  HEENT- neg, Mallampati1;  Chest- clear w/o w/r/r;  Heart- RR w/o m/r/g;  Abd- soft, nontender, neg;  Ext- neg w/o c/c/e;  Neuro- intact w/o focal abn; no palp adenopathy...  CXR 12/17/17 (independently reviewed by me in the PACS system) showed perhaps sl progression of the  perihilar scarring from prev films, norm heart size, no pneumonia or effusions, upper lumbar augmentation noted...   LABS 11/15/17>  Chems- ok w/ K=4.2, BS=110, Cr=0.79;  CBC- ok w/ Hg=16.0, WBC=10.3;  TSH=1.33...   LAB 12/17/17>  ACE=51 IMP/PLAN>>  I am disappointed that her CXR looks sl worse w/ ACE still in the norm range but this correlates to her switching to Pred10Qod- therefore discussed return to daily dosing at 10-5 for June, then 57m/d thereafter w/ ROV in 322mo. She will call for any issues in the interim...    ~  March 19, 2018:  32m64moV & Laura [name] has done well on the Pred 10-5 for June and 5mg50msince then;  Clinically she is doing satis- min cough, mostly dry/ hacking, no sput or hemoptysis, SOB/DOE is about the same- ok w/ ADLs & walking on level ground but dyspnea noted on hills/ stairs and stress; she goes to a gym 3d/wk doing yoga, light weights, and "pounding"=> a vigorous work-out w/ drumsticks! 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 CT Chest 6/17 showed progression of bilat hilar adenopathy & pulm parenchymal changes w/ mass-like density in RML~4cm, and mass-like density in ant LUL ~4x2cm -- radiology feels this is c/w sarcoid progression & ACE up to 63 => she declined bx & we started on Pred Rx=> CXRs & ACE levels followed carefully & Pred adjusted => currently at 5mgQ42mor the last 32mo..58moOTE: she reminds me that her brother had Sarcoid in his 40s, v16s servere, almost died, [place], HxMVP, palpit  w/ PVCs & PACs on Holter>  She denies CP, palpit, SOB now; advised no caffeine & avoid stress!  Currently on Amlod2.5 & BP=122/74    Chol> on Simva20 & FishOil; last FLP 11/16 was on Simva40 & showed TChol 161, TG 146, HDL 44, LDL 87;  PCP will f/u Lipid Profile soon...    GI> GERD, spasm, IBS, Polyp, +FamHx colon ca in sis> on Nexium20Bid, Carafate1GmQhs, & Zofran prn; followed by DrPyrtle- she remains asymptomatic w/o abd pain, n/v, c/d, blood seen...       ADDENDUM> she had colonoscopy 01/17/16> + for divertics, hemorrhoids, and several polyps- 4 to 6mm si27m& Path=  sessile serrated adenomas & f/u suggested 3 yrs.    UTIs> she reports no prob on Cranberry juice but had one recurrent UTI 4/16- Cipro called in & symptoms resolved;  Then Klebs UTI 3/19 also sens Cipro;  Try to avoid Nitrofurantoin due to her pulm dis...    DJD, LBP, L1 compression w/ kyphoplasty, Osteop> on calcium, MVI, VitD & Tramadol50; she fell 2/14 w/ L1 compression & kyphoplasty in [place]; BMD 2/16 w/ Tscore-1.8 & we rec ALENDRONATE70/wk=> she stopped & Endocrine consult DrEllison rec Prolia rx...    Anxiety> she has a lot of family issues, prev on Xanax 0.25 prn but off now...  EXAM shows Afeb, VSS, O2sat=97% on RA;  HEENT- neg, Mallampati1;  Chest- clear w/o w/r/r;  Heart- RR w/o m/r/g;  Abd- soft, nontender, neg;  Ext- neg w/o c/c/e;  Neuro- intact w/o focal abn; no palp adenopathy... IMP/PLAN>> we decided to continue the Pred at 30m daily for another 330monterval & plan rov w/ CXR & ACE level at that time;  She would like to see DrNinfa Meekeroing forward after my retirement at the end of the year...          Problem List:  Hx of UVEITIS (ICD-364.3) [facility] given [facility] in [facility]... s/p right cataract surg 2/09 and no active uveitis seen... ~  4/11:  she tells me she was seen 10/10 & doing well, may need left cataract surg soon. ~  [facility]  DrDickinson noted hx  uveitis- steroid responder, s/p bilat cat surg w/ lens replacement, epiretinal membrane & post vitreous detachments...   R/O SARCOIDOSIS (ICD-135) - [facility]/ CTChest showing bilat hilar adenopathy, and w/u showing neg PPD, ACE=53, sed=13... prev PFT's 1/08 showed FVC 3.75 (111%), FEV1 2.74 (102%), %1sec=73, mid-flows=70%... being followed w/ serial films and "watchful waiting"... there is a family hx of sarcoidosis in one brother (severe dis w/ neuropathy). ~   we had a f/u CXR in Feb09- streaky opacity right mid lung zone, similar to 2008...  ~  CT Chest 7/08 & 6/09 showed mediastinal > hilar adenopathy some w/ calcif (generally smaller than 1/08), area of atx/ scarring right mid zone (infer RUL) & scat <1cm nodules in lower zones w/o change... ~  CXR 2/10 showed bilat hilar prominence & scarring appears the same- NAD... labs all WNL w/ ACE=56. ~  CXR 4/11 showed mild bilat hilar prominence, scarring, NAD- osteopenia & DJD in TSpine... ACE= 37. ~  CXR 4/12 showed stable bilat hilar prominence, scarring, NAD...[name]~  CXR 11/13 showed similar changes- irreg nodular changes in right suprahilar region, scarring, stable adenopathy, mild atherosclerotic calcif, osteopenia... ~  CXR 3/14 showed normal heart size, atherosclerotic Ao, chr scarring from underlying sarcoid, NAD...[name]~  CXR 1/15 showed norm heart size, areas of scarring appear unchanged, prom hilae w/o change- stable, NAD ~  PFTs 5/15 showed just some sm airways dis> FVC=2.65 (78%), FEV1=1.86 (72%); %1sec=70; mid-flows=55% predicted... After bronchodil her FEV1 improved 6%... Lung Volumes & DLCO are wnl. ~  LABS 5/15:  ACE level = 55  ~  4/16: she had nodule excised from left elbow 4/16 by DrKuzma=> granulomatous nodule c/w unusual manifewstation of Sarcoid, all spec stains were neg... ~  5/16: CXR 5/16 showed stable BHA & scarring on right, NAD, no change... LABS 5/16:  ACE=56, stable. ~  2017>  +FluA  illness 09/2015 w/ ER eval &  CXR showing progressive abn;  CT Chest 12/2015 confirmed progression of BHA & LUL/RML parenchymal densities felt to be c/w active Sarcoid, ACE=62;  Pt wanted to avoid Bx if poss & we decided on a course of Pred 40-30-20 Q2wk taper w/ ROV in 6wks for f/u=> ACE improved to 23 and we weaned Pred slowly... ~  12/17: Pt perceives several issues w2/ the Pred (now down to 67m/d) so we will switch to Medrol892mtabs and continue slow weaning.. Marland KitchenPALPITATIONS >>  Hx MITRAL VALVE PROLAPSE >> on ASA 8181m... clinical dx w/ 2DEcho 1999 showing flat closure but no frank prolapse... ~  2/16: she had a cardiac eval by DrTaylor for dyspnea> 2DEcho was neg, wnl; stress testing was neg- w/o ischemia; noted to have poor functional capacity & is way too sedentary; she had some palpit & monitor revealed PVCs & PACs- didn't want meds therefore asked to avoid caffeine 7 stress...  HYPERCHOLESTEROLEMIA (ICD-272.0) - on SIMVASTATIN 92m43mnow... ~  FLP [place]8 on Lip10 showed TChol 183, TG 122, HDL 45, LDL 114...  ~  FLP [place]9 on Lip10 showed TChol 183, TG 189, HDL 39, LDL 107... continue diet & change to Simva40.  ~  FLP 5/09 on Simva40 showed TChol 185, TG 163, HDL 36, LDL 117... she wants to stay on Simva40. ~  FLP 2/10 showed TChol 176, Tg 160, HDL 39, LDL 105... rec> continue same. ~  FLP 4/11 showed TChol 160, TG 131, HDL 41, LDL 93 ~  FLP 4/12 showed TChol 174, TG 148, HDL 43, LDL 102 ~  FLP 5/13 showed TChol 159, TG 151, HDL 47, LDL 82 ~  FLP 5/14 on Simva40 showed TChol 165, TG 157, HDL 41, LDL 93 ~  FLP 11/15 on Simva40 showed TChol 169, TG 135, HDL 41, LDL 101 ~  FLP 11/16 on Simva40 showed TChol 161, TG 146, HDL 44, LDL 87  GERD (ICD-530.81) - on NEXIUM 92mg60m ZANTAC 300mgQ59m. ~  EGD 6/07 w/ 3cmHH, stricture, gastitis (HPylori neg)... I offered to change her Nexium to a generic but she declined- "DrPatterson told me never to change this med"... she notes occas nocturnal reflux  symptoms. ~  5/12: She had GI eval by DrPatterson 5/12> chr GERD, hx 3cmHH, prev stricture dilated, hx colon polyps, etc> c/o incr reflux symptoms, bloating, pressure despite her Nexium40AM & Zantac300PM;  Nexium was increased to Bid, Carafate added Qid & she had EGD 5/12 showing mod gastritis w/ bx= chr inflamm, neg HPylori... ~  CTAbd 5/12 showed mult parenchymal nodules at lung bases (some fractionally larger), calcif hilar nodes bilat, no renal lesions (?left upper pole mass seen on sonar?). ~  EGD 3/13 by DrPatterson showed 3cmHH, esophagitis, prob "pill espohagitis", dilated... Symptoms resolved w/ Carafate, Nexium, Zantac, Xylocaine... ~  5/14: on Nexium40Bid, Zantac300HS, CarafateQid prn, & Zofran prn; she remains asymptomatic w/o abd pain, n/v, c/d, blood seen... ~  11/15: on Nexium40, Carafate1GmQhs, & Zofran prn; she has seen DrPyrtle & remains asymptomatic on these meds- w/o abd pain, n/v, c/d, blood seen...  IRRITABLE BOWEL SYNDROME (ICD-564.1) COLONIC POLYPS, HX OF (ICD-V12.72)  ~  Colonoscopy 6/07 by DrPatterson showing divertics & polyps (adenomatous)... f/u planned 14yrs..14yr She had f/u colonoscopy 5/12 showing mod divertics, sessile polyp in cecum= serrated adenoma w/ f/u planned 3-14yrs...48yrI'S, CHRONIC (ICD-599.0) - eval by DrOttelin for urology... now off the Macrodantin and using cranberry juice without recurrent UTI, no problems...  DEGENERATIVE JOINT DISEASE (ICD-715.90) - eval by DrDalldoGwenlyn Fudge  w/ mod DJD in knees & hx of torn left meniscus... 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 overall improved now. ~  11/13:  TRAMADOL 73m Tid refilled for prn use... ~  2015-16[facility] right & left knee DJD, right ankle tendonitis, right plantar fasciitis...  ~  4/16: she had right elbow surg by DrKuzma> mult granulomas on path c/w sarcoid nodule...  LOW BACK  PAIN, CHRONIC (ICD-724.2) ~  2/14: she fell while getting up at night & hit her back w/ severe pain & L1 compression fx- [facility] & good pain relief...  OSTEOPENIA (ICD-733.90) - on Calcium, MVI, Vit D... ~  BMD in 2005 showed TScore -1.2 in Spine, & [facility] ~  BMD 5/09 showed TScores -1.5 in Spine & -1.1 in FemNecks. ~  BMD here 5/11 showed TScores -1.2 in Spine, and -1.2 in [facility] ~  5/13 & 5/14:  She is due for f/u BMD but wants to wait for now... ~  Labs 11/15 showed VitD= 52... ~  BMD 2/16 revealed Tscore -1.8 in Lspine and Rt FemNeck; we rec FOSAMAX70/wk in light of her prev compression fx=> pt stopped the Bisphos after several months due to reflux symptms and refuses alternative med... ~  She is rec to take Calcium, MVI, VitD supplement, & wt bearing exercise...  ANXIETY (ICD-300.00) >> given ALPRAZOLAM 0.27mtabs w/ directions to try 1/2 to 1 tab Tid more regularly...  Hx of BASAL CELL SKIN CANCER - [place]  Health Maintenance: ~  GI:  followed by DrPatterson/Pyrtle w/ colon as above... ~  GYN:  followed by DrRichardson & doing well by pt's report... [facility] ~  Immunizations:  she gets yearly flu shots;  has PNEUMOVAX in 1998 & repeated 10/11 (age 77  TDAP given 5/13;  we discussed Shingles vaccine (she had bout of shingles involving her right hip & leg ~age30) & she will decide.    Past Surgical History:  Procedure Laterality Date  . ABDOMINAL HYSTERECTOMY    . BASAL CELL CARCINOMA EXCISION    . CATARACT EXTRACTION Bilateral   . COLONOSCOPY  2012  . MASS EXCISION Left 11/02/2014   Procedure: EXCISION MASS LEFT ELBOW;  Surgeon: GaDaryll BrodMD;  [place] [facility] [place];  Laterality: Left;  . POLYPECTOMY    . UPPER GASTROINTESTINAL ENDOSCOPY    . VESICOVAGINAL FISTULA CLOSURE W/ TAH     "sling" after hystrectomy per patient     Outpatient Encounter Medications as of 03/19/2018  Medication Sig  . amLODipine (NORVASC) 5 MG tablet Take 2.5 mg by mouth daily.  . B Complex Vitamins (VITAMIN-B COMPLEX PO) Take 1 capsule by mouth daily.   . Calcium Carbonate (CALTRATE 600 PO) Take 1 tablet by mouth 2 (two) times daily.    . cholecalciferol (VITAMIN D) 1000 units tablet Take 1,000 Units by mouth daily.  . Esomeprazole Magnesium (NEXIUM 24HR) 20 MG TBEC Take 1 tablet by mouth 2 (two) times daily.  . Glucosamine HCl (GLUCOSAMINE PO) Liquid form - Take by mouth as directed once daily  . meclizine (ANTIVERT) 25 MG tablet Take 1/2-1 tablet by mouth every 4 hours as needed for dizziness  . Multiple Vitamins-Minerals (MULTIVITAL) tablet Take 1 tablet by mouth daily.    .[name]  Omega-3 Fatty Acids (FISH OIL) 1000 MG CAPS Take 1 capsule by mouth daily.    . ondansetron (ZOFRAN) 8 MG tablet 1 tablet by mouth every 6 hours as needed for nausea  . predniSONE (DELTASONE) 5 MG tablet TAKE 1 tab  Daily WITH BREAKFAST  . Probiotic Product (ALIGN PO) Take by mouth daily.  . simvastatin (ZOCOR) 20 MG tablet Take 20 mg by mouth at bedtime.  . sucralfate (CARAFATE) 1 g tablet Take 1 tablet (1 g total) by mouth 2 (two) times daily. (Patient taking differently: Take 1 g by mouth as needed. )  . [DISCONTINUED] predniSONE (DELTASONE) 5 MG tablet Take 5 mg by mouth daily. 2 tabs every other day   No facility-administered encounter medications on file as of 03/19/2018.     Allergies  Allergen Reactions  . 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 Amoxicillin-Pot Clavulanate     REACTION: causes diarrhea  . Codeine     REACTION: nausea  . Levaquin [Levofloxacin In D5w]     Had itching w/ intravenous, not sure if reaction was from abx or the need to change the IV.  Does well when taken with Benadryl. Pt states can take orally with no issues   . Tdap  [Tetanus-Diphth-Acell Pertussis]     Area raised, warm to touch, tender, swollen, pt felt jittery, nausea and some SOB  . Tape Rash    Blisters skin and removes skin --NEEDS PAPER TAPE    Immunization History  Administered Date(s) Administered  . Influenza Split 05/23/2011, 05/23/2012, 05/04/2013, 05/31/2014  . Influenza Whole 09/11/2009, 05/08/2010  . Influenza, High Dose Seasonal PF 05/02/2016, 05/08/2017  . Influenza-Unspecified 05/25/2015  . Pneumococcal Polysaccharide-23 05/17/2010  . Tdap 11/21/2011   Current Medications, Allergies, Past Medical History, Past Surgical History, Family History, and Social History were reviewed in [employer] record.   Review of Systems         See HPI - all other systems neg except as noted... The patient denies anorexia, fever, weight loss, weight gain, vision loss, decreased hearing, hoarseness, chest pain, syncope, dyspnea on exertion, peripheral edema, prolonged cough, headaches, hemoptysis, abdominal pain, melena, hematochezia, severe indigestion/heartburn, hematuria, incontinence, muscle weakness, suspicious skin lesions, transient blindness, difficulty walking, depression, unusual weight change, abnormal bleeding, enlarged lymph nodes, and angioedema.     Objective:   Physical Exam    WD, WN, 77 y/o WF in NAD... GENERAL:  Alert & oriented; pleasant & cooperative... HEENT:  [place]/AT, EOM-wnl, PERRLA, EACs-clear, TMs-wnl, NOSE-clear, THROAT-clear & wnl. NECK:  Supple w/ fairROM; no JVD; normal carotid impulses w/o bruits; no thyromegaly or nodules palpated; no lymphadenopathy. CHEST:  Clear to P & A; without wheezes/ rales/ or rhonchi. HEART:  Regular Rhythm; without murmurs/ rubs/ or gallops. ABDOMEN:  Soft & nontender; normal bowel sounds; no organomegaly or masses detected. EXT: without deformities, mild arthritic changes; no varicose veins/ +venous insuffic/ no edema. NEURO:  CN's intact;  no focal neuro  deficits... DERM:  No lesions noted; no rash etc...  RADIOLOGY DATA:  Reviewed in the EPIC EMR & discussed w/ the patient...  LABORATORY DATA:  Reviewed in the EPIC EMR & discussed w/ the patient...   Assessment & Plan:    DYSPNEA w/ thorough PULM & CARDIAC eval>> underlying sarcoidosis w/ mild pulm fibrosis & prev no signs of dis activity- on watchful waiting protocol;  Cards eval by DrTaylor was neg as well x PVCs & PACs; she has improved on a gradual exercise program, avoids caffeine & stress...  SARCOID>  Previous CXR/ PFT/ ACE stable & she remains asymptomatic, nodule left elbow excised & path revealed nonnecrotizing granulomata... ~  12/2015>  FluA illness 09/2015 w/ CXR via ER showed some progression of the BHA & parenchymal abnormalities; CT Chest 12/2015 confirmed & ACE=62; We decided to start Rx w/ PRED 40-30-20 Q2wk taper w/ ROV/ in 6wks. ~  02/2016>  Clinically ?sl better w/ a few Pred side effects, ACE improved to 23 & we decided to cut the Pred to 20-10 Qod til ret 6wks... ~  03/2016>  CXR shows sl improvement on the Pred 20-10Qod;  We decided to continue this dose thru Oct & decr to 51m/d starting in Nov w/ ROV in 362mo~  06/2016>  LoShylinerceives some difficulty w/ the Pred but she is improved overall & ACE down to 28; we decided to change to MEDROL 46m1046mabs taking one Qam for Dec & cut to 46mg23mternating w/ 4mg 82m starting Jan2018=> we will recheck her in Feb w/ f/u CTChest due and also due for 33yr r36yrck BMD ~  11/27/16>   We discussed decreasing her Pred from 10-5 Qod to 5mg/d 33mroutine rov in 48mo ~  60mo/18>   With the incr in ACE to 53- we decided to continue the Pred 5mg dail59mshe is asked to maintain her exercise program etc; she will call prn any resp problems and we plan ROV recheck in 3-69mo... 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 ~  09/19/17>   Laura [name] appears [place] from the pulm/ sarcoid standpoint on the low dose Pred 5mg/d & we di60mssed a dose adjustment to 10mg Qod going95mward w/ rov in 48mo ~  12/17/17>9468mo am disappointed that her CXR looks sl worse w/ ACE still in the norm range but this correlates to her switching to Pred10Qod- therefore discussed return to daily dosing at 10-5 for June, then 5mg/d thereafter20m ROV in 48mo... She will c57mofor any issues in the interim. ~  03/19/18>   We decided to continue the Pred at 5mg daily for anot43m 48mo interval & plan47mo w/ CXR & ACE level at that time;  She would like to see DrMcQuade going forward after my retirement at the end of the year  HBP>  DrHunter added Amlodipine 7 BP is back to normal...  CHOL>  Stable on the Simva40 + diet...  GERD>  On Nexium & Carafate, she is currently improved from her prev symptoms...  DJD/ LBP/ Osteopenia>  As noted she remains stable on current meds & exercise program; BMD 2/16 w/ Tscore -1.8 & rec to start FOSAMAX70=> she didn't stick w/ it & was eval by endocrine DrEllison w/ rec for PROLIA- Q68mo...  Compression 469mo1 after fall> she is s/p L1 kyphoplasty...  Other medical issues as noted...   Patient's Medications  New Prescriptions   No medications on file  Previous Medications   AMLODIPINE (NORVASC) 5 MG TABLET  Take 2.5 mg by mouth daily.   B COMPLEX VITAMINS (VITAMIN-B COMPLEX PO)    Take 1 capsule by mouth daily.    CALCIUM CARBONATE (CALTRATE 600 PO)    Take 1 tablet by mouth 2 (two) times daily.     CHOLECALCIFEROL (VITAMIN D) 1000 UNITS TABLET    Take 1,000 Units by mouth daily.   ESOMEPRAZOLE MAGNESIUM (NEXIUM 24HR) 20 MG TBEC    Take 1 tablet by mouth 2 (two) times daily.   GLUCOSAMINE HCL (GLUCOSAMINE PO)    Liquid form - Take by mouth as directed  once daily   MECLIZINE (ANTIVERT) 25 MG TABLET    Take 1/2-1 tablet by mouth every 4 hours as needed for dizziness   MULTIPLE VITAMINS-MINERALS (MULTIVITAL) TABLET    Take 1 tablet by mouth daily.     OMEGA-3 FATTY ACIDS (FISH OIL) 1000 MG CAPS    Take 1 capsule by mouth daily.     ONDANSETRON (ZOFRAN) 8 MG TABLET    1 tablet by mouth every 6 hours as needed for nausea   PREDNISONE (DELTASONE) 5 MG TABLET    TAKE 1 tab  Daily WITH BREAKFAST   PROBIOTIC PRODUCT (ALIGN PO)    Take by mouth daily.   SIMVASTATIN (ZOCOR) 20 MG TABLET    Take 20 mg by mouth at bedtime.   SUCRALFATE (CARAFATE) 1 G TABLET    Take 1 tablet (1 g total) by mouth 2 (two) times daily.  Modified Medications   No medications on file  Discontinued Medications   PREDNISONE (DELTASONE) 5 MG TABLET    Take 5 mg by mouth daily. 2 tabs every other day

## 2018-04-23 DIAGNOSIS — H52201 Unspecified astigmatism, right eye: Secondary | ICD-10-CM | POA: Diagnosis not present

## 2018-04-23 DIAGNOSIS — H5213 Myopia, bilateral: Secondary | ICD-10-CM | POA: Diagnosis not present

## 2018-05-08 ENCOUNTER — Ambulatory Visit (INDEPENDENT_AMBULATORY_CARE_PROVIDER_SITE_OTHER): Payer: Medicare Other | Admitting: Family Medicine

## 2018-05-08 ENCOUNTER — Encounter: Payer: Self-pay | Admitting: Family Medicine

## 2018-05-08 VITALS — BP 130/80 | HR 69 | Temp 97.9°F | Resp 16 | Ht 65.0 in | Wt 170.0 lb

## 2018-05-08 DIAGNOSIS — J329 Chronic sinusitis, unspecified: Secondary | ICD-10-CM | POA: Diagnosis not present

## 2018-05-08 DIAGNOSIS — K219 Gastro-esophageal reflux disease without esophagitis: Secondary | ICD-10-CM

## 2018-05-08 DIAGNOSIS — D869 Sarcoidosis, unspecified: Secondary | ICD-10-CM | POA: Diagnosis not present

## 2018-05-08 DIAGNOSIS — E785 Hyperlipidemia, unspecified: Secondary | ICD-10-CM

## 2018-05-08 DIAGNOSIS — R739 Hyperglycemia, unspecified: Secondary | ICD-10-CM | POA: Diagnosis not present

## 2018-05-08 DIAGNOSIS — I10 Essential (primary) hypertension: Secondary | ICD-10-CM

## 2018-05-08 DIAGNOSIS — Z0000 Encounter for general adult medical examination without abnormal findings: Secondary | ICD-10-CM | POA: Diagnosis not present

## 2018-05-08 DIAGNOSIS — T380X5A Adverse effect of glucocorticoids and synthetic analogues, initial encounter: Secondary | ICD-10-CM | POA: Diagnosis not present

## 2018-05-08 DIAGNOSIS — Z0001 Encounter for general adult medical examination with abnormal findings: Secondary | ICD-10-CM | POA: Diagnosis not present

## 2018-05-08 DIAGNOSIS — K222 Esophageal obstruction: Secondary | ICD-10-CM

## 2018-05-08 DIAGNOSIS — Z79899 Other long term (current) drug therapy: Secondary | ICD-10-CM

## 2018-05-08 DIAGNOSIS — B9689 Other specified bacterial agents as the cause of diseases classified elsewhere: Secondary | ICD-10-CM

## 2018-05-08 LAB — COMPREHENSIVE METABOLIC PANEL
ALBUMIN: 4.3 g/dL (ref 3.5–5.2)
ALT: 15 U/L (ref 0–35)
AST: 17 U/L (ref 0–37)
Alkaline Phosphatase: 75 U/L (ref 39–117)
BUN: 15 mg/dL (ref 6–23)
CO2: 29 meq/L (ref 19–32)
CREATININE: 0.75 mg/dL (ref 0.40–1.20)
Calcium: 9.6 mg/dL (ref 8.4–10.5)
Chloride: 103 mEq/L (ref 96–112)
GFR: 79.48 mL/min (ref >60.00)
Glucose, Bld: 97 mg/dL (ref 70–99)
Potassium: 3.8 mEq/L (ref 3.5–5.1)
SODIUM: 142 meq/L (ref 135–145)
Total Bilirubin: 1 mg/dL (ref 0.2–1.2)
Total Protein: 7.1 g/dL (ref 6.0–8.3)

## 2018-05-08 LAB — LIPID PANEL
Cholesterol: 164 mg/dL (ref 0–200)
HDL: 47.9 mg/dL (ref >39.00)
LDL CALC: 82 mg/dL (ref 0–99)
NONHDL: 115.72
Total CHOL/HDL Ratio: 3
Triglycerides: 168 mg/dL — ABNORMAL HIGH (ref 0.0–149.0)
VLDL: 33.6 mg/dL (ref 0.0–40.0)

## 2018-05-08 LAB — CBC
HCT: 47.7 % — ABNORMAL HIGH (ref 36.0–46.0)
Hemoglobin: 15.8 g/dL — ABNORMAL HIGH (ref 12.0–15.0)
MCHC: 33.2 g/dL (ref 30.0–36.0)
MCV: 92.2 fl (ref 78.0–100.0)
Platelets: 249 10*3/uL (ref 150.0–400.0)
RBC: 5.17 Mil/uL — AB (ref 3.87–5.11)
RDW: 13.1 % (ref 11.5–15.5)
WBC: 6.2 10*3/uL (ref 4.0–10.5)

## 2018-05-08 LAB — VITAMIN B12: VITAMIN B 12: 1039 pg/mL — AB (ref 211–911)

## 2018-05-08 LAB — MICROALBUMIN / CREATININE URINE RATIO
Creatinine,U: 111.3 mg/dL
MICROALB/CREAT RATIO: 0.6 mg/g (ref 0.0–30.0)

## 2018-05-08 LAB — HEMOGLOBIN A1C: HEMOGLOBIN A1C: 6.1 % (ref 4.6–6.5)

## 2018-05-08 MED ORDER — SIMVASTATIN 10 MG PO TABS: 20.0000 mg | ORAL_TABLET | Freq: Every day | ORAL | 3 refills | Status: DC

## 2018-05-08 MED ORDER — AMLODIPINE BESYLATE 2.5 MG PO TABS: 2.5000 mg | ORAL_TABLET | Freq: Every day | ORAL | 3 refills | Status: DC

## 2018-05-08 NOTE — Patient Instructions (Addendum)
offered Prevnar 13- will call in for nurse visit once she has improved from current illness   May start azithromycin for bacterial sinus infection  Please stop by lab before you go

## 2018-05-08 NOTE — Progress Notes (Addendum)
Phone: [phone]  Subjective:  Patient presents today for their annual physical. Chief complaint-noted.   See problem oriented charting- ROS- full  review of systems was completed and negative except for: runny nose, sinus pressure, sore throat, headache  The following were reviewed and entered/updated in epic: Past Medical History:  Diagnosis Date  . Adenomatous colon polyp    sessile  . Allergy    SEASONAL  . Anxiety   . Cataract    bilateral  . Colon polyps   . Diverticulosis of colon (without mention of hemorrhage)   . DJD (degenerative joint disease)   . Dysrhythmia    pac  . Esophagitis   . Essential hypertension 06/04/2017  . GERD (gastroesophageal reflux disease)   . Hiatal hernia   . Hypercholesterolemia   . IBS (irritable bowel syndrome)   . MVP (mitral valve prolapse)   . Osteopenia   . Personal history of colonic polyps    SESSILE SERRATED ADENOMAS (X2)  . PONV (postoperative nausea and vomiting)   . Sarcoidosis   . Shortness of breath dyspnea   . Stricture and stenosis of esophagus   . Unspecified gastritis and gastroduodenitis without mention of hemorrhage   . Urinary tract infection, site not specified   . Uveitis    Patient Active Problem List   Diagnosis Date Noted  . Steroid-induced hyperglycemia 10/18/2016    Priority: High  . Sarcoidosis 11/21/2011    Priority: High  . Essential hypertension 06/04/2017    Priority: Medium  . Hyperlipidemia 10/18/2016    Priority: Medium  . Osteoporosis 12/06/2014    Priority: Medium  . Heart palpitations 07/26/2014    Priority: Medium  . Dyspnea 08/20/2013    Priority: Medium  . GERD with stricture 10/12/2011    Priority: Medium  . Uveitis 09/21/2007    Priority: Medium  . Recurrent UTI 09/09/2007    Priority: Medium  . Anxiety state 08/06/2007    Priority: Medium  . History of colonic polyps 08/06/2007    Priority: Medium  . History of basal cell cancer 10/18/2016    Priority: Low  .  Compression fx, lumbar spine ([place]) 09/24/2012    Priority: Low  . Pneumonia 09/24/2012    Priority: Low  . Osteoarthritis 09/21/2007    Priority: Low  . IBS (irritable bowel syndrome) 08/06/2007    Priority: Low  . LOW BACK PAIN, CHRONIC 08/06/2007    Priority: Low   Past Surgical History:  Procedure Laterality Date  . ABDOMINAL HYSTERECTOMY    . BASAL CELL CARCINOMA EXCISION    . CATARACT EXTRACTION Bilateral   . COLONOSCOPY  2012  . MASS EXCISION Left 11/02/2014   Procedure: EXCISION MASS LEFT ELBOW;  Surgeon: Daryll Brod, MD;  Location: [facility];  Service: Orthopedics;  Laterality: Left;  . POLYPECTOMY    . UPPER GASTROINTESTINAL ENDOSCOPY    . VESICOVAGINAL FISTULA CLOSURE W/ TAH     "sling" after hystrectomy per patient    Family History  Problem Relation Age of Onset  . Melanoma Mother        per pt melanoma   . Colon cancer Sister        sees Dr Henrene Pastor   . Colon polyps Brother   . Sarcoidosis Brother   . Colon polyps Sister        Dr stark   . Colon polyps Sister   . Endometrial cancer Sister   . Lung cancer Brother   . Other Unknown  nephew with Wegener's   . Stroke Sister   . Osteoporosis Neg Hx     Medications- reviewed and updated Current Outpatient Medications  Medication Sig Dispense Refill  . amLODipine (NORVASC) 2.5 MG tablet Take 1 tablet (2.5 mg total) by mouth daily. 90 tablet 3  . B Complex Vitamins (VITAMIN-B COMPLEX PO) Take 1 capsule by mouth daily.     . Calcium Carbonate (CALTRATE 600 PO) Take 1 tablet by mouth 2 (two) times daily.      . cholecalciferol (VITAMIN D) 1000 units tablet Take 1,000 Units by mouth daily.    . Esomeprazole Magnesium (NEXIUM 24HR) 20 MG TBEC Take 1 tablet by mouth 2 (two) times daily.    . Glucosamine HCl (GLUCOSAMINE PO) Liquid form - Take by mouth as directed once daily    . meclizine (ANTIVERT) 25 MG tablet Take 1/2-1 tablet by mouth every 4 hours as needed for dizziness    . Multiple  Vitamins-Minerals (MULTIVITAL) tablet Take 1 tablet by mouth daily.      . Omega-3 Fatty Acids (FISH OIL) 1000 MG CAPS Take 1 capsule by mouth daily.      . ondansetron (ZOFRAN) 8 MG tablet 1 tablet by mouth every 6 hours as needed for nausea 25 tablet 5  . predniSONE (DELTASONE) 5 MG tablet TAKE 1 tab  Daily WITH BREAKFAST 90 tablet 0  . Probiotic Product (ALIGN PO) Take by mouth daily.    . simvastatin (ZOCOR) 10 MG tablet Take 2 tablets (20 mg total) by mouth at bedtime. 90 tablet 3  . sucralfate (CARAFATE) 1 g tablet Take 1 tablet (1 g total) by mouth 2 (two) times daily. (Patient taking differently: Take 1 g by mouth as needed. ) 60 tablet 2   No current facility-administered medications for this visit.     Allergies-reviewed and updated Allergies  Allergen Reactions  . 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 Amoxicillin-Pot Clavulanate     REACTION: causes diarrhea  . Codeine     REACTION: nausea  . Levaquin [Levofloxacin In D5w]     Had itching w/ intravenous, not sure if reaction was from abx or the need to change the IV.  Does well when taken with Benadryl. Pt states can take orally with no issues   . Tdap [Tetanus-Diphth-Acell Pertussis]     Area raised, warm to touch, tender, swollen, pt felt jittery, nausea and some SOB  . Tape Rash    Blisters skin and removes skin --NEEDS PAPER TAPE    Social History   Social History Narrative   Married- husband with parkinsons. Will be patient of Dr. Yong Channel   Needs time to care for husband      Retired Artist (until 2017), bookkeeping prior      Hobbies: cooking, florist work, Barrister's clerk    Objective: BP 130/80   Pulse 69   Temp 97.9 F (36.6 C) (Oral)   Resp 16   Ht 5\' 5"  (1.651 m)   Wt 170 lb (77.1 kg)   SpO2 96%   BMI 28.29 kg/m  Gen: NAD, resting comfortably HEENT: frontal sinus tenderness. Yellow discharge in nares,  pharynx mildly erythematous.  Neck: no thyromegaly CV: RRR no murmurs rubs or gallops Lungs: CTAB no crackles, wheeze, rhonchi Abdomen: soft/nontender/nondistended/normal bowel sounds. No rebound or guarding.  Ext: no edema Skin: warm, dry Neuro: grossly  normal, moves all extremities, PERRLA   Assessment/Plan:  77 y.o. female presenting for annual physical.  Health Maintenance counseling: 1. Anticipatory guidance: Patient counseled regarding regular dental exams -q6 months, eye exams - seeing Dr. Maryclare Bean for blepharitis issues, then seeing Dr. Rachelle Hora for cataract, seeing and Dr. Manuella Ghazi retina specialist , wearing seatbelts.  2. Risk factor reduction:  Advised patient of need for regular exercise and diet rich and fruits and vegetables to reduce risk of heart attack and stroke. Exercise- 3 days a week- yoga, pound it- she does this while husband in his parkinsons program. Diet-weight stable- she would like to lose a few lbs by cleaning up diet.  Wt Readings from Last 3 Encounters:  05/08/18 170 lb (77.1 kg)  03/19/18 171 lb 3.2 oz (77.7 kg)  12/17/17 171 lb 12.8 oz (77.9 kg)  3. Immunizations/screenings/ancillary studies Immunization History  Administered Date(s) Administered  . Influenza Split 05/23/2011, 05/23/2012, 05/04/2013, 05/31/2014  . Influenza Whole 09/11/2009, 05/08/2010  . Influenza, High Dose Seasonal PF 05/02/2016, 05/08/2017  . Influenza-Unspecified 05/25/2015, 04/08/2018  . Pneumococcal Polysaccharide-23 05/17/2010  . Tdap 11/21/2011   Health Maintenance Due  Topic Date Due  . PNA vac Low Risk Adult (2 of 2 - PCV13)-offered Prevnar 13- will call in for nurse visit once she has improved from current illness  05/18/2011  . INFLUENZA VACCINE -received at outside pharmacy 02/20/2018  . URINE MICROALBUMIN-we will update today 05/08/2018   4. Cervical cancer screening- she is past age based screening, gets pelvic exam 5. Breast cancer screening-  breast exam with GYN and  mammogram-past age based screening but still gets with GYN and no issues reported  6. Colon cancer screening - 01/17/2016 with 3-year repeat due to polyp history, sister with colon cancer 7. Skin cancer screening- Dr. Melanee Left once a year for dermatology. advised regular sunscreen use. Denies worrisome, changing, or new skin lesions.  8. Birth control/STD check-monogamous and postmenopausal 9. Osteoporosis screening at 73- 10/29/2016.  Referred to endocrine given elevated fracture risk and long-term prednisone. Dr. Loanne Drilling suggested prolia- she is reluctant to start right now. She will reconsider 10. Never smoker  Status of chronic or acute concerns   About 10 days ago started with sore throat, runny nose, mild headaches. Daughter with similar symptoms. Over weekend started with increased head congestion- now thickened and discolored. Throat doing better. Some sinus tenderness.  We agreed to sending in azithromycin given immunosuppression with prednisone plus already with 10 days of symptoms and now worsening- looks like bacterial sinusitis. Return precautions for worsening or failure to improve given verbally  Hypertension-remains controlled on amlodipine 2.5 mg  GERD remains controlled on Nexium 20 mg twice daily.  Will get B12 level with high risk medication. She is on b12 daily.   For sarcoidosis-remains on chronic prednisone 5 mg daily. Sees pulm. Sees optho- 3 different specialists.   Steroid induced hyperglycemia- dont want to label as diabetes unless a1c gets above 6.5 persistently Lab Results  Component Value Date   HGBA1C 6.1 05/08/2017   HGBA1C 6.4 08/28/2016   HGBA1C 6.7 (H) 02/28/2016   Hyperlipidemia-remains on simvastatin 20 mg daily- takes half actually due to being on amlodipine.  Update lipid panel. Change to 10mg  pill  Future Appointments  Date Time Provider [facility]  07/03/2018 10:30 AM Noralee Space, MD [facility] None   6 months  Lab/Order associations:  Fasting Preventative health care - Plan: CBC, Comprehensive metabolic panel, Hemoglobin A1c, Lipid panel, Vitamin B12, Microalbumin / creatinine urine  ratio  GERD with stricture  Sarcoidosis  Hyperlipidemia, unspecified hyperlipidemia type - Plan: CBC, Comprehensive metabolic panel, Lipid panel  Steroid-induced hyperglycemia - Plan: Hemoglobin A1c, Microalbumin / creatinine urine ratio  High risk medication use - Plan: Vitamin B12  Essential hypertension - Plan: CBC, Comprehensive metabolic panel, Lipid panel  Meds ordered this encounter  Medications  . simvastatin (ZOCOR) 10 MG tablet    Sig: Take 2 tablets (20 mg total) by mouth at bedtime.    Dispense:  90 tablet    Refill:  3  . amLODipine (NORVASC) 2.5 MG tablet    Sig: Take 1 tablet (2.5 mg total) by mouth daily.    Dispense:  90 tablet    Refill:  3    Return precautions advised.  Garret Reddish, MD

## 2018-05-09 ENCOUNTER — Other Ambulatory Visit: Payer: Self-pay

## 2018-05-09 ENCOUNTER — Telehealth: Payer: Self-pay | Admitting: *Deleted

## 2018-05-09 MED ORDER — AZITHROMYCIN 250 MG PO TABS: ORAL_TABLET | ORAL | 0 refills | Status: DC

## 2018-05-09 MED ORDER — SIMVASTATIN 20 MG PO TABS: 20.0000 mg | ORAL_TABLET | Freq: Every day | ORAL | 3 refills | Status: DC

## 2018-05-09 NOTE — Telephone Encounter (Signed)
I forgot to send this in- I apologize. I just sent it in

## 2018-05-09 NOTE — Addendum Note (Signed)
Addended by: Marin Olp on: 05/09/2018 09:22 AM   Modules accepted: Orders

## 2018-05-09 NOTE — Telephone Encounter (Signed)
Copied from [place] [id number]. Topic: General - Other >> May 09, 2018  8:06 AM Yvette Rack wrote: Reason for CRM: pt calling stating that she thought that Dr Yong Channel was going to send an antibiotic Zpac to her pharmacy for her issues that they discussed in the office

## 2018-05-09 NOTE — Telephone Encounter (Signed)
Spoke with patient. She is about to head to the pharmacy to pick up her prescription. All questions answered. She stated that she appreciates all of the help.

## 2018-05-09 NOTE — Telephone Encounter (Signed)
Dr Yong Channel, per your note yesterday: We agreed to sending in azithromycin given immunosuppression with prednisone plus already with 10 days of symptoms and now worsening- looks like bacterial sinusitis. Return precautions for worsening or failure to improve given verbally.  Please advise on dosing instructions and if you would like to order prednisone as well.

## 2018-05-09 NOTE — Telephone Encounter (Signed)
Called patient, left voicemail informing her that medication has been sent in.

## 2018-05-27 DIAGNOSIS — H43393 Other vitreous opacities, bilateral: Secondary | ICD-10-CM | POA: Diagnosis not present

## 2018-05-27 DIAGNOSIS — H52203 Unspecified astigmatism, bilateral: Secondary | ICD-10-CM | POA: Diagnosis not present

## 2018-05-27 DIAGNOSIS — H02835 Dermatochalasis of left lower eyelid: Secondary | ICD-10-CM | POA: Diagnosis not present

## 2018-05-27 DIAGNOSIS — H35341 Macular cyst, hole, or pseudohole, right eye: Secondary | ICD-10-CM | POA: Diagnosis not present

## 2018-05-27 DIAGNOSIS — H11823 Conjunctivochalasis, bilateral: Secondary | ICD-10-CM | POA: Diagnosis not present

## 2018-05-27 DIAGNOSIS — Z961 Presence of intraocular lens: Secondary | ICD-10-CM | POA: Diagnosis not present

## 2018-05-27 DIAGNOSIS — H26493 Other secondary cataract, bilateral: Secondary | ICD-10-CM | POA: Diagnosis not present

## 2018-05-27 DIAGNOSIS — D869 Sarcoidosis, unspecified: Secondary | ICD-10-CM | POA: Diagnosis not present

## 2018-05-27 DIAGNOSIS — H524 Presbyopia: Secondary | ICD-10-CM | POA: Diagnosis not present

## 2018-05-27 DIAGNOSIS — H35373 Puckering of macula, bilateral: Secondary | ICD-10-CM | POA: Diagnosis not present

## 2018-05-27 DIAGNOSIS — H02831 Dermatochalasis of right upper eyelid: Secondary | ICD-10-CM | POA: Diagnosis not present

## 2018-05-27 DIAGNOSIS — H02834 Dermatochalasis of left upper eyelid: Secondary | ICD-10-CM | POA: Diagnosis not present

## 2018-05-27 DIAGNOSIS — H5213 Myopia, bilateral: Secondary | ICD-10-CM | POA: Diagnosis not present

## 2018-06-20 ENCOUNTER — Other Ambulatory Visit: Payer: Self-pay | Admitting: Pulmonary Disease

## 2018-06-23 ENCOUNTER — Ambulatory Visit: Payer: Self-pay

## 2018-06-23 NOTE — Telephone Encounter (Signed)
Incoming call from Patient concerned about her blood pressure.  States that it has been elevated for a month.  Patient assessed her blood pressure while on the phone.  1st blood pressure was 184/ 90 HR 83 second blood pressure was 167/74 HR 82 Related it to Office.  They schedule appointment with Dr. [name], 06/24/18@ 1100 with arrival for 1044am  Patient voiced understanding.   Reason for Disposition . Systolic BP  >= 161 OR Diastolic >= 096  Answer Assessment - Initial Assessment Questions 1. BLOOD PRESSURE: "What is the blood pressure?" "Did you take at least two measurements 5 minutes apart?"     184/90 heart rate 83 2. ONSET: "When did you take your blood pressure?"     A month 3. HOW: "How did you obtain the blood pressure?" (e.g., visiting nurse, automatic home BP monitor)     automatic 4. HISTORY: "Do you have a history of high blood pressure?"     yes 5. MEDICATIONS: "Are you taking any medications for blood pressure?" "Have you missed any doses recently?"     no 6. OTHER SYMPTOMS: "Do you have any symptoms?" (e.g., headache, chest pain, blurred vision, difficulty breathing, weakness)     Headaches , no vision 7. PREGNANCY: "Is there any chance you are pregnant?" "When was your last menstrual period?"     na  Protocols used: HIGH BLOOD PRESSURE-A-AH

## 2018-06-24 ENCOUNTER — Ambulatory Visit (INDEPENDENT_AMBULATORY_CARE_PROVIDER_SITE_OTHER): Payer: Medicare Other | Admitting: Family Medicine

## 2018-06-24 ENCOUNTER — Encounter: Payer: Self-pay | Admitting: Family Medicine

## 2018-06-24 VITALS — BP 154/84 | HR 72 | Temp 98.3°F | Ht 66.0 in | Wt 170.4 lb

## 2018-06-24 DIAGNOSIS — G4700 Insomnia, unspecified: Secondary | ICD-10-CM

## 2018-06-24 DIAGNOSIS — I10 Essential (primary) hypertension: Secondary | ICD-10-CM | POA: Diagnosis not present

## 2018-06-24 MED ORDER — AMLODIPINE BESYLATE 5 MG PO TABS: 5.0000 mg | ORAL_TABLET | Freq: Every day | ORAL | 3 refills | Status: DC

## 2018-06-24 NOTE — Patient Instructions (Addendum)
It was very nice to see you today!  Please increase your amlodipine to 5 mg daily.  Keep an eye on your blood pressure over the next week or so and let me know or let Dr. Yong Channel know if it is consistently 140/90 or higher.  You can try taking 5 to 10 mg of melatonin at night for your sleep.  You can also try taking over-the-counter Benadryl as well.  Take care, Dr [name]

## 2018-06-24 NOTE — Progress Notes (Signed)
   Subjective:  Laura [name] is a 77 y.o. female who presents today for same-day appointment with a chief complaint of hypertension.   HPI:  Hypertension, Chronic problem, worsening Long-standing history.  Currently on Norvasc 2.5 mg daily.  Over last several weeks she has noticed elevated blood pressure readings into the 150s to 160s over 80s.  No chest pain or shortness of breath.  No obvious precipitating events.  She has been under some stress recently due to the care of her husband has Parkinson's disease, otherwise has no no obvious reasons why her blood pressures been elevated recently.  Insomnia, chronic problem, new to provider Several year history.  Worsened recently due to the stress noted above.  She has not taken any treatments for this.  In the past she has been on Xanax however this is been several years.  She is interested in trying over-the-counter medications or supplements.  Symptoms seem to be worsened with stress.  No other obvious alleviating factors.  ROS: Per HPI  PMH: She reports that she has never smoked. She has never used smokeless tobacco. She reports that she drinks about 1.0 standard drinks of alcohol per week. She reports that she does not use drugs.  Objective:  Physical Exam: BP (!) 154/84 (BP Location: Left Arm, Patient Position: Sitting, Cuff Size: Normal)   Pulse 72   Temp 98.3 F (36.8 C) (Oral)   Ht 5\' 6"  (1.676 m)   Wt 170 lb 6.1 oz (77.3 kg)   SpO2 98%   BMI 27.50 kg/m   Wt Readings from Last 3 Encounters:  06/24/18 170 lb 6.1 oz (77.3 kg)  05/08/18 170 lb (77.1 kg)  03/19/18 171 lb 3.2 oz (77.7 kg)  Gen: NAD, resting comfortably CV: RRR with no murmurs appreciated Pulm: NWOB, CTAB with no crackles, wheezes, or rhonchi  Assessment/Plan:  Hypertension No red flags.  Increase Norvasc to 5 mg daily.  Discussed home blood pressure monitoring with goal 140/90 or lower.  Insomnia Discussed treatment options.  Recommended  over-the-counter melatonin and/or Benadryl as needed.  Discussed reasons return to care.  [name]. [name], MD 06/24/2018 12:05 PM

## 2018-06-26 ENCOUNTER — Ambulatory Visit: Payer: Medicare Other | Admitting: Pulmonary Disease

## 2018-06-26 DIAGNOSIS — H43813 Vitreous degeneration, bilateral: Secondary | ICD-10-CM | POA: Diagnosis not present

## 2018-06-26 DIAGNOSIS — Z961 Presence of intraocular lens: Secondary | ICD-10-CM | POA: Diagnosis not present

## 2018-06-26 DIAGNOSIS — H30033 Focal chorioretinal inflammation, peripheral, bilateral: Secondary | ICD-10-CM | POA: Diagnosis not present

## 2018-06-26 DIAGNOSIS — H35372 Puckering of macula, left eye: Secondary | ICD-10-CM | POA: Diagnosis not present

## 2018-06-26 DIAGNOSIS — H35341 Macular cyst, hole, or pseudohole, right eye: Secondary | ICD-10-CM | POA: Diagnosis not present

## 2018-07-01 DIAGNOSIS — H26491 Other secondary cataract, right eye: Secondary | ICD-10-CM | POA: Insufficient documentation

## 2018-07-01 DIAGNOSIS — H26492 Other secondary cataract, left eye: Secondary | ICD-10-CM | POA: Insufficient documentation

## 2018-07-03 ENCOUNTER — Ambulatory Visit: Payer: Medicare Other | Admitting: Pulmonary Disease

## 2018-07-03 ENCOUNTER — Encounter: Payer: Self-pay | Admitting: Pulmonary Disease

## 2018-07-03 ENCOUNTER — Ambulatory Visit (INDEPENDENT_AMBULATORY_CARE_PROVIDER_SITE_OTHER)
Admission: RE | Admit: 2018-07-03 | Discharge: 2018-07-03 | Disposition: A | Discharge status: Home / Self Care | Payer: Medicare Other | Source: Ambulatory Visit | Attending: Pulmonary Disease | Admitting: Pulmonary Disease

## 2018-07-03 ENCOUNTER — Other Ambulatory Visit (INDEPENDENT_AMBULATORY_CARE_PROVIDER_SITE_OTHER): Payer: Medicare Other

## 2018-07-03 VITALS — BP 136/62 | HR 73 | Temp 97.7°F | Ht 66.0 in | Wt 171.0 lb

## 2018-07-03 DIAGNOSIS — D869 Sarcoidosis, unspecified: Secondary | ICD-10-CM

## 2018-07-03 LAB — SEDIMENTATION RATE: Sed Rate: 15 mm/hr (ref 0–30)

## 2018-07-03 MED ORDER — PREDNISONE 5 MG PO TABS: ORAL_TABLET | ORAL | 3 refills | Status: DC

## 2018-07-03 NOTE — Patient Instructions (Addendum)
Today we updated your med list in our EPIC system...    Continue your current medications the same...    We refilled your Prednisone rx...  Today we checked a follow up CXR & your Sarcoid blood work...    We will contact you w/ the results when available...   We discussed having you follow up w/ my young partner Dr. Rodman Pickle in about 6 months...  Laura [name], it has been my great honor to have known (and loved) your family over these many years...    It has been an honor to have been your doctor...    Sending my best wishes for a very merry christmas and a happy & healthy new year & beyond!!!

## 2018-07-04 LAB — ANGIOTENSIN CONVERTING ENZYME: Angiotensin-Converting Enzyme: 50 U/L (ref 9–67)

## 2018-07-10 ENCOUNTER — Telehealth: Payer: Self-pay | Admitting: Pulmonary Disease

## 2018-07-10 NOTE — Telephone Encounter (Addendum)
Called and spoke to pt, who is requesting lab results from 07/04/18. Pt is requesting a call back today  SN please advise. Thanks  Current Outpatient Medications on File Prior to Visit  Medication Sig Dispense Refill  . amLODipine (NORVASC) 5 MG tablet Take 1 tablet (5 mg total) by mouth daily. 90 tablet 3  . B Complex Vitamins (VITAMIN-B COMPLEX PO) Take 1 capsule by mouth daily.     . Calcium Carbonate (CALTRATE 600 PO) Take 1 tablet by mouth 2 (two) times daily.      . cholecalciferol (VITAMIN D) 1000 units tablet Take 1,000 Units by mouth daily.    . Esomeprazole Magnesium (NEXIUM 24HR) 20 MG TBEC Take 1 tablet by mouth 2 (two) times daily.    . Glucosamine HCl (GLUCOSAMINE PO) Liquid form - Take by mouth as directed once daily    . meclizine (ANTIVERT) 25 MG tablet Take 1/2-1 tablet by mouth every 4 hours as needed for dizziness    . Multiple Vitamins-Minerals (MULTIVITAL) tablet Take 1 tablet by mouth daily.      . Omega-3 Fatty Acids (FISH OIL) 1000 MG CAPS Take 1 capsule by mouth daily.      . ondansetron (ZOFRAN) 8 MG tablet 1 tablet by mouth every 6 hours as needed for nausea 25 tablet 5  . predniSONE (DELTASONE) 5 MG tablet TAKE 1 TABLET BY MOUTH DAILY WITH BREAKFAST 100 tablet 3  . Probiotic Product (ALIGN PO) Take by mouth daily.    . simvastatin (ZOCOR) 20 MG tablet Take 1 tablet (20 mg total) by mouth at bedtime. 90 tablet 3  . sucralfate (CARAFATE) 1 g tablet Take 1 tablet (1 g total) by mouth 2 (two) times daily. (Patient taking differently: Take 1 g by mouth as needed. ) 60 tablet 2   No current facility-administered medications on file prior to visit.     Allergies  Allergen Reactions  . 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 Amoxicillin-Pot Clavulanate     REACTION: causes diarrhea  . Codeine     REACTION: nausea  . Levaquin [Levofloxacin In D5w]     Had itching w/  intravenous, not sure if reaction was from abx or the need to change the IV.  Does well when taken with Benadryl. Pt states can take orally with no issues   . Tdap [Tetanus-Diphth-Acell Pertussis]     Area raised, warm to touch, tender, swollen, pt felt jittery, nausea and some SOB  . Tape Rash    Blisters skin and removes skin --NEEDS PAPER TAPE

## 2018-07-10 NOTE — Telephone Encounter (Signed)
Will let Dr. Lenna Gilford know.

## 2018-07-10 NOTE — Telephone Encounter (Signed)
Called and informed Patient that Dr. Lenna Gilford will contact her once he has seen labs.  Patient stated no rush and understanding.  Nothing further at this time.

## 2018-08-02 IMAGING — DX DG CHEST 2V
2 series · 2 of 2 positions shown · non-contrast
Comparison: 08/28/2016 and earlier.

CLINICAL DATA: 76-year-old female with sarcoidosis.

EXAM:
CHEST  2 VIEW

[chest pa]
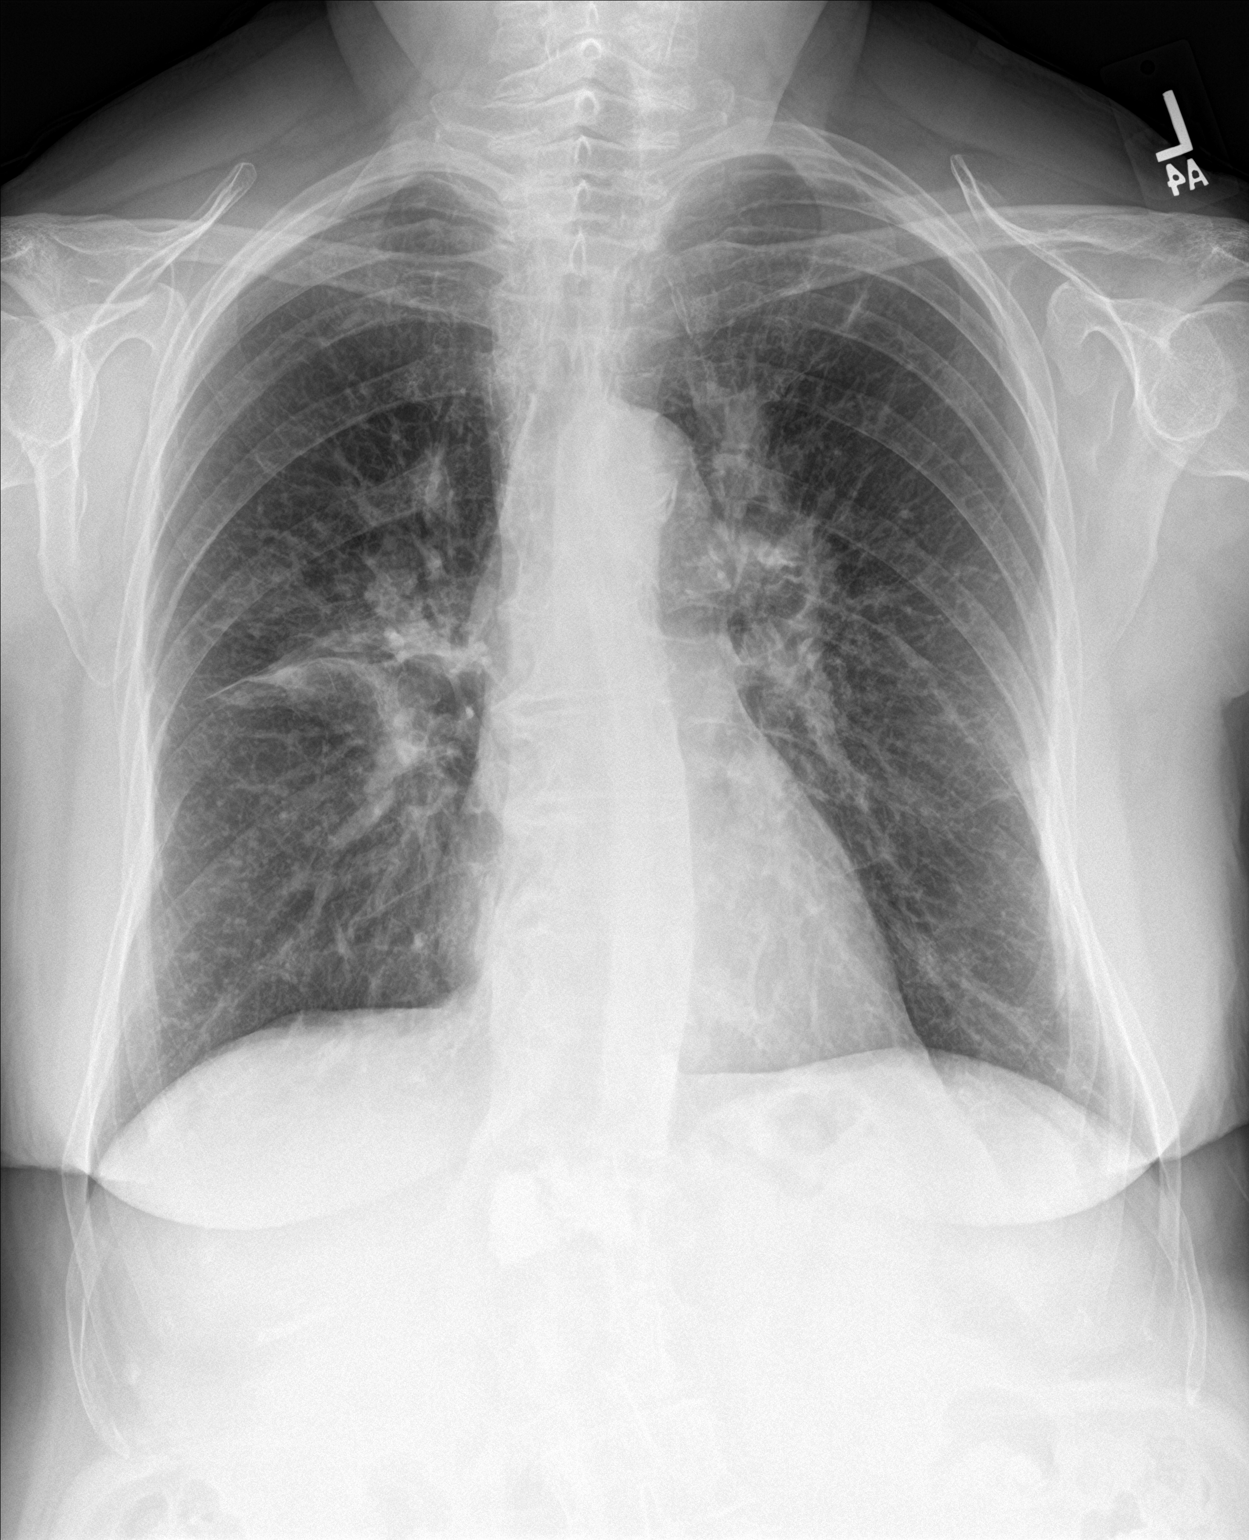

[chest lat]
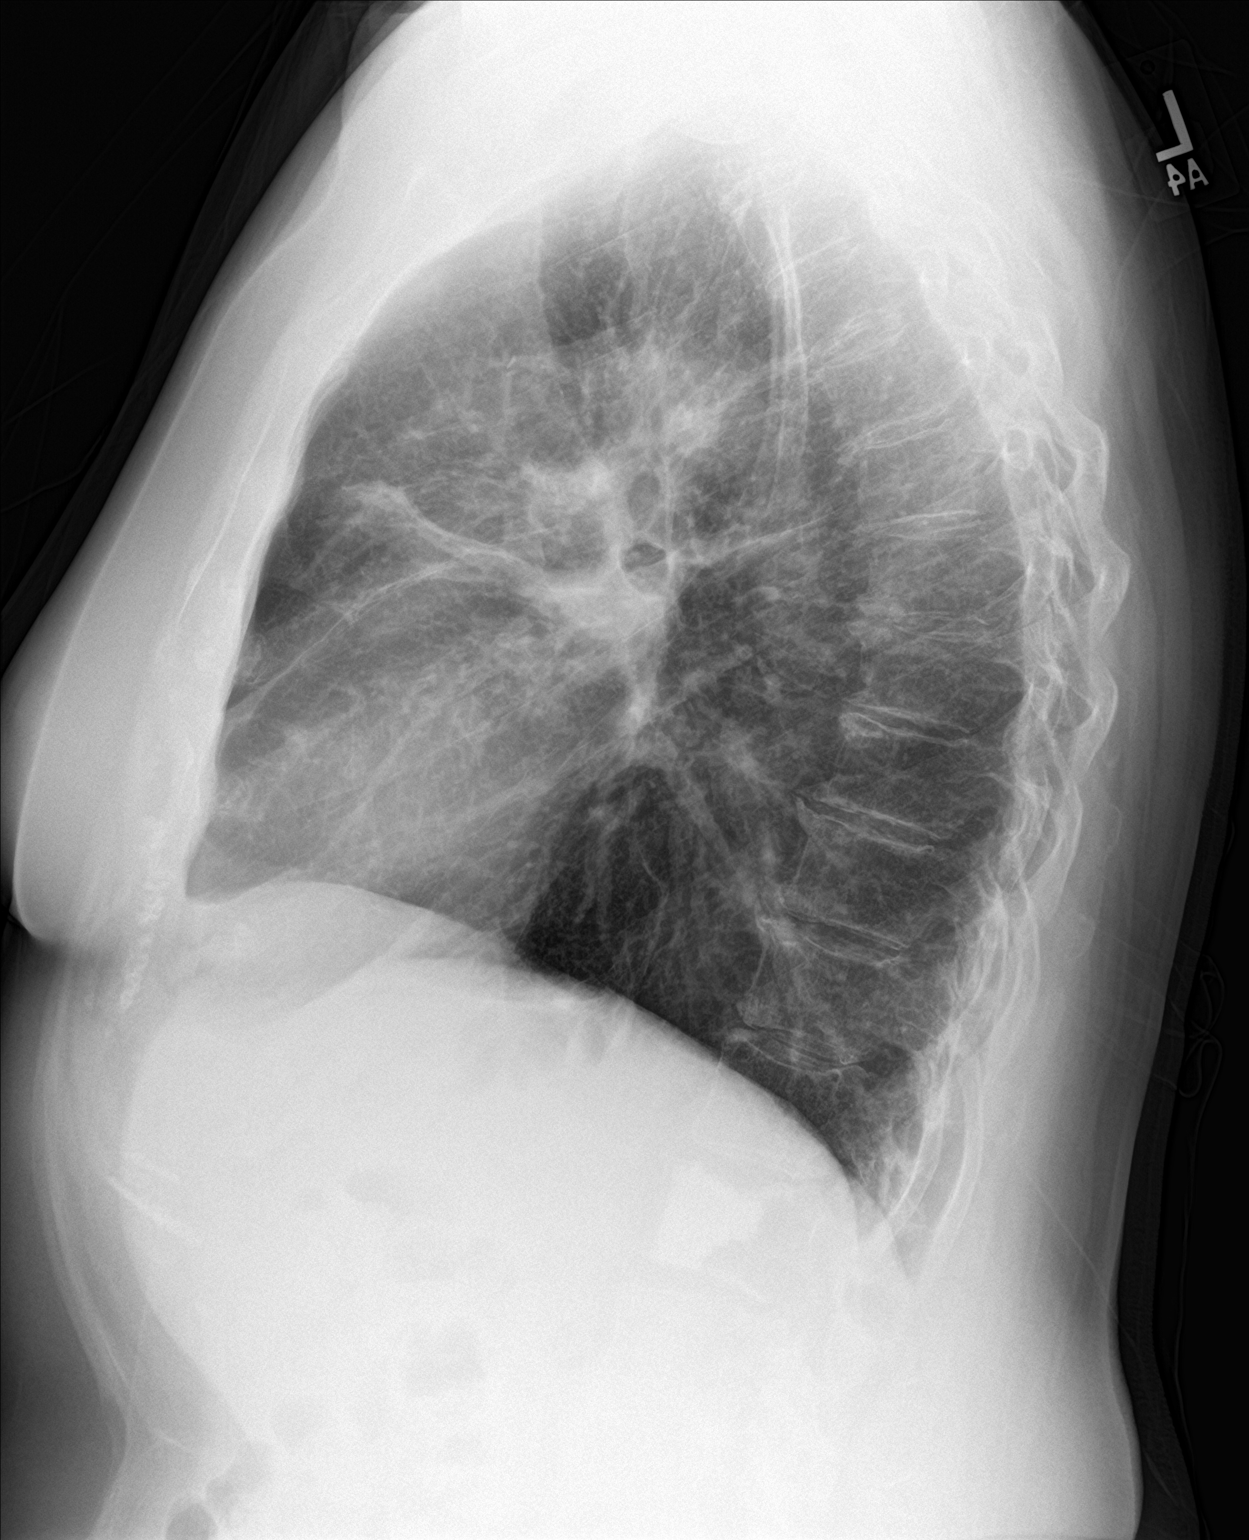

[2 of 2 positions shown; findings below may reference images not displayed]

FINDINGS: Chronic hilar enlargement and architectural distortion appears
stable since the chest CT on 01/06/2016. Other mediastinal contours
are within normal limits. Stable lung volumes. Chronic bilateral
increased pulmonary interstitial markings appear stable. Stable
tracheal contour. No pneumothorax, pulmonary edema, pleural effusion
or acute pulmonary opacity. Dextroconvex scoliosis with chronic
upper lumbar augmented compression fracture. No acute osseous
abnormality identified. Negative visible bowel gas pattern.
IMPRESSION: 1. Chronic lung and mediastinal changes compatible with sarcoidosis
appears stable since [DATE].  No acute cardiopulmonary abnormality.

## 2018-08-06 DIAGNOSIS — Z48810 Encounter for surgical aftercare following surgery on the sense organs: Secondary | ICD-10-CM | POA: Diagnosis not present

## 2018-08-06 DIAGNOSIS — Z9842 Cataract extraction status, left eye: Secondary | ICD-10-CM | POA: Diagnosis not present

## 2018-09-04 DIAGNOSIS — Z85828 Personal history of other malignant neoplasm of skin: Secondary | ICD-10-CM | POA: Diagnosis not present

## 2018-09-04 DIAGNOSIS — Z08 Encounter for follow-up examination after completed treatment for malignant neoplasm: Secondary | ICD-10-CM | POA: Diagnosis not present

## 2018-11-06 ENCOUNTER — Telehealth: Payer: Self-pay | Admitting: Family Medicine

## 2018-11-06 NOTE — Telephone Encounter (Signed)
Copied from [place] [id number]. Topic: General - Other >> Nov 06, 2018 10:42 AM Richardo Priest, NT wrote: Reason for CRM: Patient called in stating she would like a sudafed prescription refilled. Dr.Hunter has not refilled [phone]. Preferred pharmacy [facility] [address] [place] AT [place] [phone] (Phone) [phone] (Fax)  Call back if any changes

## 2018-11-07 NOTE — Telephone Encounter (Signed)
Patient is schedule 11/10/18 @ 920am

## 2018-11-07 NOTE — Telephone Encounter (Signed)
Please call and schedule an appointment with Dr. Yong Channel

## 2018-11-07 NOTE — Telephone Encounter (Signed)
Please call and schedule a virtual visit with Dr. Yong Channel.

## 2018-11-10 ENCOUNTER — Encounter: Payer: Self-pay | Admitting: Family Medicine

## 2018-11-10 ENCOUNTER — Ambulatory Visit (INDEPENDENT_AMBULATORY_CARE_PROVIDER_SITE_OTHER): Payer: Medicare Other | Admitting: Family Medicine

## 2018-11-10 VITALS — BP 133/73 | HR 80 | Temp 97.2°F | Ht 66.0 in | Wt 173.8 lb

## 2018-11-10 DIAGNOSIS — R739 Hyperglycemia, unspecified: Secondary | ICD-10-CM | POA: Diagnosis not present

## 2018-11-10 DIAGNOSIS — K219 Gastro-esophageal reflux disease without esophagitis: Secondary | ICD-10-CM

## 2018-11-10 DIAGNOSIS — Z6828 Body mass index (BMI) 28.0-28.9, adult: Secondary | ICD-10-CM

## 2018-11-10 DIAGNOSIS — D869 Sarcoidosis, unspecified: Secondary | ICD-10-CM

## 2018-11-10 DIAGNOSIS — I10 Essential (primary) hypertension: Secondary | ICD-10-CM | POA: Diagnosis not present

## 2018-11-10 DIAGNOSIS — E785 Hyperlipidemia, unspecified: Secondary | ICD-10-CM | POA: Diagnosis not present

## 2018-11-10 DIAGNOSIS — E663 Overweight: Secondary | ICD-10-CM | POA: Diagnosis not present

## 2018-11-10 DIAGNOSIS — T380X5A Adverse effect of glucocorticoids and synthetic analogues, initial encounter: Secondary | ICD-10-CM

## 2018-11-10 DIAGNOSIS — K222 Esophageal obstruction: Secondary | ICD-10-CM

## 2018-11-10 MED ORDER — ONDANSETRON 4 MG PO TBDP: 4.0000 mg | ORAL_TABLET | Freq: Three times a day (TID) | ORAL | 1 refills | Status: DC | PRN

## 2018-11-10 MED ORDER — SUCRALFATE 1 G PO TABS: 1.0000 g | ORAL_TABLET | Freq: Two times a day (BID) | ORAL | 2 refills | Status: DC | PRN

## 2018-11-10 NOTE — Assessment & Plan Note (Signed)
S:controlled on BID nexium 20mg . Very sparingly needs Carafate as well- Dr. Andree Moro previously prescribed and she asked for refill from me A/P: Stable-refilled Carafate

## 2018-11-10 NOTE — Patient Instructions (Addendum)
Health Maintenance Due  Topic Date Due  . PNA vac Low Risk Adult (2 of 2 - PCV13)- next in person visit 05/18/2011   Video visit- phq2 needed at follow up

## 2018-11-10 NOTE — Progress Notes (Addendum)
Phone [phone]   Subjective:  Virtual visit via Video note. Chief complaint: Chief Complaint  Patient presents with  . Hypertension   This visit type was conducted due to national recommendations for restrictions regarding the COVID-19 Pandemic (e.g. social distancing).  This format is felt to be most appropriate for this patient at this time balancing risks to patient and risks to population by having him in for in person visit.  No physical exam was performed (except for noted visual exam or audio findings with Telehealth visits).    Our team/I connected with Laura [name] on 11/10/18 at  9:20 AM EDT by a video enabled telemedicine application ([email or web address]) and verified that I am speaking with the correct person using two identifiers.  Location patient: [place] Location provider: [facility], office Persons participating in the virtual visit:  Patient, daughter helped her set video up   Our team/I discussed the limitations of evaluation and management by telemedicine and the availability of in person appointments. In light of current covid-19 pandemic, patient also understands that we are trying to protect them by minimizing in office contact if at all possible.  The patient expressed consent for telemedicine visit and agreed to proceed. Patient understands insurance will be billed.   ROS- No chest pain or shortness of breath. No headache or blurry vision.  No fever/chills. Stable cough with sarcoid  Past Medical History-  Patient Active Problem List   Diagnosis Date Noted  . Steroid-induced hyperglycemia 10/18/2016    Priority: High  . Sarcoidosis 11/21/2011    Priority: High  . Essential hypertension 06/04/2017    Priority: Medium  . Hyperlipidemia 10/18/2016    Priority: Medium  . Osteoporosis 12/06/2014    Priority: Medium  . Heart palpitations 07/26/2014    Priority: Medium  . Dyspnea 08/20/2013    Priority: Medium  . GERD with stricture 10/12/2011    Priority:  Medium  . Uveitis 09/21/2007    Priority: Medium  . Recurrent UTI 09/09/2007    Priority: Medium  . Anxiety state 08/06/2007    Priority: Medium  . History of colonic polyps 08/06/2007    Priority: Medium  . History of basal cell cancer 10/18/2016    Priority: Low  . Compression fx, lumbar spine ([place]) 09/24/2012    Priority: Low  . Pneumonia 09/24/2012    Priority: Low  . Osteoarthritis 09/21/2007    Priority: Low  . IBS (irritable bowel syndrome) 08/06/2007    Priority: Low  . LOW BACK PAIN, CHRONIC 08/06/2007    Priority: Low    Medications- reviewed and updated Current Outpatient Medications  Medication Sig Dispense Refill  . amLODipine (NORVASC) 5 MG tablet Take 1 tablet (5 mg total) by mouth daily. 90 tablet 3  . B Complex Vitamins (VITAMIN-B COMPLEX PO) Take 1 capsule by mouth daily.     . Calcium Carbonate (CALTRATE 600 PO) Take 1 tablet by mouth 2 (two) times daily.      . cholecalciferol (VITAMIN D) 1000 units tablet Take 1,000 Units by mouth daily.    . Esomeprazole Magnesium (NEXIUM 24HR) 20 MG TBEC Take 1 tablet by mouth 2 (two) times daily.    . Glucosamine HCl (GLUCOSAMINE PO) Liquid form - Take by mouth as directed once daily    . meclizine (ANTIVERT) 25 MG tablet Take 1/2-1 tablet by mouth every 4 hours as needed for dizziness    . Multiple Vitamins-Minerals (MULTIVITAL) tablet Take 1 tablet by mouth daily.      [name]  Omega-3 Fatty Acids (FISH OIL) 1000 MG CAPS Take 1 capsule by mouth daily.      . ondansetron (ZOFRAN-ODT) 4 MG disintegrating tablet Take 1 tablet (4 mg total) by mouth every 8 (eight) hours as needed for nausea (with vertigo). 30 tablet 1  . predniSONE (DELTASONE) 5 MG tablet TAKE 1 TABLET BY MOUTH DAILY WITH BREAKFAST 100 tablet 3  . Probiotic Product (ALIGN PO) Take by mouth daily.    . simvastatin (ZOCOR) 20 MG tablet Take 1 tablet (20 mg total) by mouth at bedtime. 90 tablet 3  . sucralfate (CARAFATE) 1 g tablet Take 1 tablet (1 g total) by mouth  2 (two) times daily as needed. 60 tablet 2   No current facility-administered medications for this visit.      Objective:  BP 133/73   Pulse 80   Temp (!) 97.2 F (36.2 C)   Ht 5\' 6"  (1.676 m)   Wt 173 lb 12.8 oz (78.8 kg)   BMI 28.05 kg/m  home readings Gen: NAD, resting comfortably Lungs: nonlabored, normal respiratory rate  Skin: appears dry, no obvious rash Normal speech    Assessment and Plan   #hypertension S: controlled on amlodipine 5 mg BP Readings from Last 3 Encounters:  11/10/18 133/73  07/03/18 136/62  06/24/18 (!) 154/84  A/P: Stable. Continue current medications.   # GERD S:controlled on BID nexium 20mg . Very sparingly needs Carafate as well- Dr. Andree Moro previously prescribed and she asked for refill from me A/P: Stable-refilled Carafate  #Steroid-induced hyperglycemia/overweight/sarcoidosis S: remains on prednisone 5mg  (has been on prednisone for 3 years) for her sarcoidosis which has been pretty stable. She sees a new pulmonologist in June- had been with Dr. Lenna Gilford. Despite prednisone- has been able to keep A1c under 6.5. Not exercising- was exercising at the gym with husband for parkinsons Could do rocksteady boxing classes. Weight up 2 lbs- and may be worse when gets on ou scales Lab Results  Component Value Date   HGBA1C 6.1 05/08/2018   HGBA1C 6.1 05/08/2017   HGBA1C 6.4 08/28/2016   A/P: We discussed possibly having patient in for an A1c at this time- we thought the benefits were outweighed by the risks especially given prior good control and no significant lifestyle change other than decrease in exercise-for that reason we are going to focus on increasing exercise and improving diet-counseling provided today  #hyperlipidemia S:  controlled on simvastatin 20mg  Lab Results  Component Value Date   CHOL 164 05/08/2018   HDL 47.90 05/08/2018   LDLCALC 82 05/08/2018   LDLDIRECT 91.0 05/08/2017   TRIG 168.0 (H) 05/08/2018   CHOLHDL 3 05/08/2018    A/P: too early to repeat lipid panel- liekly controled- continue current rx  Other notes: 1.daughter lives close and has helped them stay inside as much as possible- doing some rides and walks outside.   2.  Due for bone density-we discussed doing this at follow-up 3.  Has not had Shingrix-recommended holding off during current COVID-19 pandemic 4.  History of vertigo- has done well recently but likes to have Zofran on hand-refilled this.  3-5 month for follow up Lab/Order associations: Essential hypertension  Gastroesophageal reflux disease, esophagitis presence not specified - Plan: sucralfate (CARAFATE) 1 g tablet  Steroid-induced hyperglycemia  Overweight  Hyperlipidemia, unspecified hyperlipidemia type  Sarcoidosis  GERD with stricture  Meds ordered this encounter  Medications  . sucralfate (CARAFATE) 1 g tablet    Sig: Take 1 tablet (1 g total) by mouth  2 (two) times daily as needed.    Dispense:  60 tablet    Refill:  2  . ondansetron (ZOFRAN-ODT) 4 MG disintegrating tablet    Sig: Take 1 tablet (4 mg total) by mouth every 8 (eight) hours as needed for nausea (with vertigo).    Dispense:  30 tablet    Refill:  1   Return precautions advised.  Garret Reddish, MD

## 2018-12-04 ENCOUNTER — Other Ambulatory Visit: Payer: Self-pay

## 2018-12-04 ENCOUNTER — Other Ambulatory Visit: Payer: Self-pay | Admitting: Internal Medicine

## 2018-12-04 ENCOUNTER — Encounter: Payer: Self-pay | Admitting: Internal Medicine

## 2018-12-04 ENCOUNTER — Ambulatory Visit (INDEPENDENT_AMBULATORY_CARE_PROVIDER_SITE_OTHER): Payer: Medicare Other | Admitting: Internal Medicine

## 2018-12-04 VITALS — Ht 68.0 in | Wt 170.0 lb

## 2018-12-04 DIAGNOSIS — K219 Gastro-esophageal reflux disease without esophagitis: Secondary | ICD-10-CM | POA: Diagnosis not present

## 2018-12-04 DIAGNOSIS — R140 Abdominal distension (gaseous): Secondary | ICD-10-CM | POA: Diagnosis not present

## 2018-12-04 DIAGNOSIS — K921 Melena: Secondary | ICD-10-CM

## 2018-12-04 DIAGNOSIS — Z800 Family history of malignant neoplasm of digestive organs: Secondary | ICD-10-CM

## 2018-12-04 DIAGNOSIS — K224 Dyskinesia of esophagus: Secondary | ICD-10-CM | POA: Diagnosis not present

## 2018-12-04 DIAGNOSIS — Z86010 Personal history of colonic polyps: Secondary | ICD-10-CM | POA: Diagnosis not present

## 2018-12-04 DIAGNOSIS — R198 Other specified symptoms and signs involving the digestive system and abdomen: Secondary | ICD-10-CM | POA: Diagnosis not present

## 2018-12-04 MED ORDER — ESOMEPRAZOLE MAGNESIUM 40 MG PO CPDR: 40.0000 mg | DELAYED_RELEASE_CAPSULE | Freq: Two times a day (BID) | ORAL | 1 refills | Status: DC

## 2018-12-04 NOTE — Progress Notes (Signed)
Subjective:    Patient ID: Laura [name], female    DOB: June 04, 1941, 78 y.o.   MRN: [id number]  This service was provided via telemedicine.  Telephone call visit The patient was located at home The provider was located in provider's GI office. The patient did consent to this telephone visit and is aware of possible charges through their insurance for this visit.   The persons participating in this telemedicine service were the patient and I. Time spent on call: 17 min   HPI Chinmayi Rumer is a 78 yo female with PMH of GERD, hiatal hernia, sessile serrated polyps, family history of colon cancer in her sister who is seen for follow-up.  Virtual visit in the setting of COVID-19.  The patient last had her colonoscopy 3 years ago where she had 3 sessile serrated polyps removed.  There was diverticulosis and hemorrhoids.  She reports that her digestive system has been off.  She has had worsening of her nighttime heartburn and also esophageal spasm symptoms.  She feels that stress is worsened her symptoms and notes that since the pandemic stress levels have been worse.  Lying on her right side also worsens her reflux.  She often wakes with a bitter taste in her mouth.  Her bowel habits have also been altered where she has been having a bowel movement each morning but they feel incomplete.  Occasionally it takes 2 or 3 times in the morning to have a complete evacuation.  She is noticed rectal pressure.  She is also seen some small amounts of blood with wiping as well as mucus in the stool.  She is noticed more belching and bloating and a queasy feeling.  She has been able to eat.  No vomiting.  No abdominal pain.  No dysphagia.  She has been using Nexium 40 mg each morning.   Review of Systems As per HPI, otherwise negative  Current Medications, Allergies, Past Medical History, Past Surgical History, Family History and Social History were reviewed in [employer] record.      Objective:   Physical Exam No PE, virtual visit  CBC    Component Value Date/Time   WBC 6.2 05/08/2018 1009   RBC 5.17 (H) 05/08/2018 1009   HGB 15.8 (H) 05/08/2018 1009   HCT 47.7 (H) 05/08/2018 1009   PLT 249.0 05/08/2018 1009   MCV 92.2 05/08/2018 1009   MCHC 33.2 05/08/2018 1009   RDW 13.1 05/08/2018 1009   LYMPHSABS 1.2 03/07/2017 1232   MONOABS 0.5 03/07/2017 1232   EOSABS 0.1 03/07/2017 1232   BASOSABS 0.1 03/07/2017 1232   CMP     Component Value Date/Time   NA 142 05/08/2018 1009   K 3.8 05/08/2018 1009   CL 103 05/08/2018 1009   CO2 29 05/08/2018 1009   GLUCOSE 97 05/08/2018 1009   GLUCOSE 105 (H) 08/01/2006 0943   BUN 15 05/08/2018 1009   CREATININE 0.75 05/08/2018 1009   CALCIUM 9.6 05/08/2018 1009   PROT 7.1 05/08/2018 1009   ALBUMIN 4.3 05/08/2018 1009   AST 17 05/08/2018 1009   ALT 15 05/08/2018 1009   ALKPHOS 75 05/08/2018 1009   BILITOT 1.0 05/08/2018 1009   GFRNONAA 88.11 11/16/2009 1045   GFRAA 92 09/08/2008 0000       Assessment & Plan:  78 yo female with PMH of GERD, hiatal hernia, sessile serrated polyps, family history of colon cancer in her sister who is seen for follow-up.   1.  GERD/esophageal spasm --she is having GERD symptoms despite daily PPI.  I recommended the following --Increase Nexium to 40 mg twice daily AC until June 1, then return to once daily before breakfast --she has been buying this over-the-counter but please send prescription for 40 mg twice daily --Upper endoscopy, we discussed the risk, benefits and alternatives and she is agreeable and wishes to proceed  2.  History of sessile serrated polyp/family history of colon cancer/blood with stooling/altered bowel function --I recommended that we proceed with surveillance colonoscopy to evaluate symptoms but also for surveillance.  We discussed the risk, benefits and alternatives and she is agreeable and wishes to proceed.  --Colonoscopy  These procedures can be  scheduled in early June 2020 in the [facility]

## 2018-12-04 NOTE — Addendum Note (Signed)
Addended by: Larina Bras on: 12/04/2018 04:04 PM   Modules accepted: Orders

## 2018-12-04 NOTE — Patient Instructions (Addendum)
We have sent the following medications to your pharmacy for you to pick up at your convenience: Nexium to 40 mg twice daily before meals until June 1, then return to once daily before breakfast   You have been scheduled for a telephone previsit on Thursday, 12/11/18 at 10:00 am.  You have been scheduled for an endoscopy and colonoscopy on 12/24/18 at 7:30 am in [place].

## 2018-12-11 ENCOUNTER — Ambulatory Visit (AMBULATORY_SURGERY_CENTER): Payer: Self-pay

## 2018-12-11 ENCOUNTER — Encounter: Payer: Self-pay | Admitting: Internal Medicine

## 2018-12-11 ENCOUNTER — Other Ambulatory Visit: Payer: Self-pay

## 2018-12-11 VITALS — Ht 68.0 in | Wt 170.0 lb

## 2018-12-11 DIAGNOSIS — K219 Gastro-esophageal reflux disease without esophagitis: Secondary | ICD-10-CM

## 2018-12-11 DIAGNOSIS — Z86010 Personal history of colonic polyps: Secondary | ICD-10-CM

## 2018-12-11 MED ORDER — NA SULFATE-K SULFATE-MG SULF 17.5-3.13-1.6 GM/177ML PO SOLN: 1.0000 | Freq: Once | ORAL | 0 refills | Status: AC

## 2018-12-11 NOTE — Progress Notes (Signed)
Denies allergies to eggs or soy products. Denies complication of anesthesia or sedation. Denies use of weight loss medication. Denies use of O2.   Emmi instructions given for colonoscopy.  Pre-Visit was conducted by phone due to Covid 19. Patient states that her daughter was tested for Covid 19 on 12/09/18. Lanelle Bal was informed and the patient was instructed to call us back to let Dr. Hilarie Fredrickson know if her daughter test positive. Patient was instructed that we may need to reschedule if the daughter is positive. Patient agreed that she would let us know by the first of next week. Instructions were reviewed and mailed to confirmed home address. Patient was encouraged to call if she had any questions or concerns.

## 2018-12-12 ENCOUNTER — Ambulatory Visit (INDEPENDENT_AMBULATORY_CARE_PROVIDER_SITE_OTHER): Payer: Medicare Other | Admitting: Family Medicine

## 2018-12-12 ENCOUNTER — Encounter: Payer: Self-pay | Admitting: Family Medicine

## 2018-12-12 VITALS — BP 148/82 | HR 99 | Ht 68.0 in

## 2018-12-12 DIAGNOSIS — Z20828 Contact with and (suspected) exposure to other viral communicable diseases: Secondary | ICD-10-CM

## 2018-12-12 DIAGNOSIS — Z20822 Contact with and (suspected) exposure to covid-19: Secondary | ICD-10-CM

## 2018-12-12 NOTE — Progress Notes (Signed)
    Chief Complaint:  Laura [name] is a 78 y.o. female who presents for a telephone visit with a chief complaint of COVID19 exposure.   Assessment/Plan:  COVID 19 exposure Patient does not have any current symptoms however she has an increased risk for complication from WHQPR-[id number] due to her age and long sarcoidosis.  Had a lengthy discussion with patient regarding signs to watch out for and reasons to return to care/seek emergent care.  Given that she does not have any current symptoms, does not need testing at this point.    Subjective:  HPI:  Exposure to [place] Patient's daughter was diagnosed with COVID-19 a couple of days ago.  She was around her daughter about a week ago.  At that time her daughter did not have any symptoms.  Over last several days daughter started developing symptoms including fever of 101.5 F, sore throat, and muscle aches.  Patient does not currently have any symptoms.  She has underlying history of lung sarcoidosis and is concerned that she has a potential high risk for complication from [id number]-65 infection.  ROS: Per HPI PMH: She reports that she has never smoked. She has never used smokeless tobacco. She reports current alcohol use of about 1.0 standard drinks of alcohol per week. She reports that she does not use drugs.      Objective/Observations   NAD, speaking in full sentences  Telephone Visit   I connected with Laura [name] on 12/12/18 at  3:00 PM EDT via telephone and verified that I am speaking with the correct person using two identifiers. I discussed the limitations of evaluation and management by telemedicine and the availability of in person appointments. The patient expressed understanding and agreed to proceed.   Patient location: Home Provider location: [place] participating in the virtual visit: Myself and Patient  A total of 15 minutes were spent on medical discussion.      [name]. [name], MD  12/12/2018 2:57 PM

## 2018-12-16 ENCOUNTER — Telehealth: Payer: Self-pay | Admitting: Internal Medicine

## 2018-12-16 NOTE — Telephone Encounter (Signed)
Dr.Pyrtle,  The patient is for ECL in [place] on 12/24/2018. Pt's daughter started having Covid-19 symptoms on 5/16, the patient was in the same house with her daughter on 5/16-5/17 while she was having symptoms, pt was wearing a "N-95 mask" and kept her distance from her daughter but she was in the same house. The daughter was tested on 5/20 and got + Covid results on 5/22. The patient has not been in contact with her daughter since 5/17, and she denies any symptoms at this time. Ok to proceed with procedures or should we reschedule? Please advise? Thank you, Robbin pv

## 2018-12-16 NOTE — Telephone Encounter (Signed)
Pt called and stated that her daughter has tested positive for covid and wants to know if she can still keep her appt. Pt didn't specify if she was in contact or not but she stated she was informed by daughter to contact us and to let us know. Pt is wanting to speak with the nurse to advised.

## 2018-12-16 NOTE — Telephone Encounter (Signed)
Spoke with patient. I gave her Dr.Pyrtle's recommendations. Pt was glad about having to reschedule. New ECL appointment made for 01/13/2019 at 2:30 pm per pt's request. New Suprep instructions mailed to patient-pt is aware. Pt verbalizes understanding to call us back if any Covid-19 symptoms occur.

## 2018-12-16 NOTE — Telephone Encounter (Signed)
I would recommend we postpone procedures by at least 14 days Patient should monitor herself for fever, cough, shortness of breath, diarrhea or other respiratory symptoms and let me know and let her primary care know should this occur

## 2018-12-24 ENCOUNTER — Encounter: Payer: Medicare Other | Admitting: Internal Medicine

## 2019-01-05 ENCOUNTER — Telehealth: Payer: Self-pay | Admitting: Pulmonary Disease

## 2019-01-05 NOTE — Telephone Encounter (Signed)
Called and spoke with pt to see if she knew the reason for Tonya's call and she stated it was to get her on schedule for appt with Dr. Loanne Drilling 6/18. I checked and Dr. Loanne Drilling has opened up clinic.  Asked pt all the COVID screening questions. When asked pt if she had been around anyone that had been either sick or exposed to [place], she stated that her daughter had COVID early on when COVID first came to the states but she was not around her daughter. Pt stated that her daughter has had a negative COVID test since the positive COVID test.  Went through all the COVID symptoms and pt answered negative for all the symptoms on the Communicable Disease Screening Sheet. Pt has been scheduled an appt with Dr. Loanne Drilling Thursday, 6/18 at 11am. Pt knows to wear a mask and that we do have no visitor policy currently in effect due to [place]. Nothing further needed.

## 2019-01-08 ENCOUNTER — Encounter: Payer: Self-pay | Admitting: Pulmonary Disease

## 2019-01-08 ENCOUNTER — Other Ambulatory Visit: Payer: Self-pay

## 2019-01-08 ENCOUNTER — Ambulatory Visit (INDEPENDENT_AMBULATORY_CARE_PROVIDER_SITE_OTHER): Payer: Medicare Other

## 2019-01-08 ENCOUNTER — Ambulatory Visit: Payer: Medicare Other | Admitting: Pulmonary Disease

## 2019-01-08 VITALS — BP 180/90 | HR 95 | Ht 68.0 in | Wt 173.2 lb

## 2019-01-08 DIAGNOSIS — D869 Sarcoidosis, unspecified: Secondary | ICD-10-CM

## 2019-01-08 DIAGNOSIS — R918 Other nonspecific abnormal finding of lung field: Secondary | ICD-10-CM | POA: Diagnosis not present

## 2019-01-08 NOTE — Patient Instructions (Signed)
For your sarcoidosis: --CXR today --Follow-up via virtual visit in 3 months --Follow-up in-office at 6 months with blood draw

## 2019-01-08 NOTE — Progress Notes (Signed)
Subjective:   PATIENT ID: Laura [name] GENDER: female DOB: 04-16-1941, MRN: [id number]   HPI  Chief Complaint  Patient presents with  . Consult    Former Programmer, systems patient for Sarcoid. She reports she has been good. No new concerns.    Laura [name] is a 78 year old female never smoker with sarcoidosis who was previously followed by Dr. Lenna Gilford.  She presents for follow-up.  She was last seen by Dr. Lenna Gilford on 03/19/2018.  Clinic notes have been reviewed from this visit and summarized as follows: She was initially seen by Dr. Durwin Nora in 2017 and started on prednisone taper.  She is chronically on prednisone 5 mg  She denies any respiratory symptoms. No shortness of breath, wheezing or cough. Denies chest pain, palpitations.  Daughter was diagnosed with COVID-19 in May. Did not require hospitalizations. She reports being exposed however daughter always droplet masks and patient wore N-95 and maintained social distancing since March.   She is planning on a family vacation. Her son's family is wearing masks when they go out and limit activity to work and grocery shopping.  I have personally reviewed patient's past medical/family/social history, allergies, current medications.  Past Medical History:  Diagnosis Date  . Adenomatous colon polyp    sessile  . Allergy    SEASONAL  . Anxiety   . Cancer [facility])    Skin cancer  . Cataract    bilateral  . Colon polyps   . Diverticulosis of colon (without mention of hemorrhage)   . DJD (degenerative joint disease)   . Dysrhythmia    pac  . Esophagitis   . Essential hypertension 06/04/2017  . GERD (gastroesophageal reflux disease)   . Hiatal hernia   . Hypercholesterolemia   . IBS (irritable bowel syndrome)   . MVP (mitral valve prolapse)   . Osteopenia   . Personal history of colonic polyps    SESSILE SERRATED ADENOMAS (X2)  . PONV (postoperative nausea and vomiting)   . Sarcoidosis   . Shortness of breath dyspnea   . Stricture  and stenosis of esophagus   . Unspecified gastritis and gastroduodenitis without mention of hemorrhage   . Urinary tract infection, site not specified   . Uveitis      Family History  Problem Relation Age of Onset  . Melanoma Mother        per pt melanoma   . Colon cancer Sister        sees Dr Henrene Pastor   . Colon polyps Brother   . Sarcoidosis Brother   . Colon polyps Sister        Dr stark   . Colon polyps Sister   . Pneumonia Sister        died in her 36s  . Endometrial cancer Sister   . Lung cancer Brother   . Other Other        nephew with Wegener's   . Esophageal cancer Other   . Stroke Sister   . Osteoporosis Neg Hx   . Rectal cancer Neg Hx   . Stomach cancer Neg Hx      Social History   Socioeconomic History  . Marital status: Married    Spouse name: Kris Burd  . Number of children: 2  . Years of education: Not on file  . Highest education level: Not on file  Occupational History  . Occupation: Retired    Comment: & Part Time Artist  Social Needs  . Emergency planning/management officer  strain: Not on file  . Food insecurity    Worry: Not on file    Inability: Not on file  . Transportation needs    Medical: Not on file    Non-medical: Not on file  Tobacco Use  . Smoking status: Never Smoker  . Smokeless tobacco: Never Used  Substance and Sexual Activity  . Alcohol use: Yes    Alcohol/week: 1.0 standard drinks    Types: 1 Glasses of wine per week    Comment: occ wine   . Drug use: No  . Sexual activity: Not on file  Lifestyle  . Physical activity    Days per week: Not on file    Minutes per session: Not on file  . Stress: Not on file  Relationships  . Social Herbalist on phone: Not on file    Gets together: Not on file    Attends religious service: Not on file    Active member of club or organization: Not on file    Attends meetings of clubs or organizations: Not on file    Relationship status: Not on file  . Intimate partner violence     Fear of current or ex partner: Not on file    Emotionally abused: Not on file    Physically abused: Not on file    Forced sexual activity: Not on file  Other Topics Concern  . Not on file  Social History Narrative   Married- husband with parkinsons. Will be patient of Dr. Yong Channel   Needs time to care for husband      Retired Artist (until 2017), bookkeeping prior      Hobbies: cooking, florist work, Barrister's clerk     Allergies  Allergen Reactions  . 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 Amoxicillin-Pot Clavulanate     REACTION: causes diarrhea  . Codeine     REACTION: nausea  . Levaquin [Levofloxacin In D5w]     Had itching w/ intravenous, not sure if reaction was from abx or the need to change the IV.  Does well when taken with Benadryl. Pt states can take orally with no issues   . Tdap [Tetanus-Diphth-Acell Pertussis]     Area raised, warm to touch, tender, swollen, pt felt jittery, nausea and some SOB  . Tape Rash    Blisters skin and removes skin --NEEDS PAPER TAPE     Outpatient Medications Prior to Visit  Medication Sig Dispense Refill  . amLODipine (NORVASC) 5 MG tablet Take 1 tablet (5 mg total) by mouth daily. 90 tablet 3  . B Complex Vitamins (VITAMIN-B COMPLEX PO) Take 1 capsule by mouth daily.     . Calcium Carbonate (CALTRATE 600 PO) Take 1 tablet by mouth 2 (two) times daily.      . cholecalciferol (VITAMIN D) 1000 units tablet Take 1,000 Units by mouth daily.    [name] esomeprazole (NEXIUM) 40 MG capsule TAKE 1 CAPSULE(40 MG) BY MOUTH TWICE DAILY BEFORE A MEAL 180 capsule 0  . Glucosamine HCl (GLUCOSAMINE PO) Liquid form - Take by mouth as directed once daily    . meclizine (ANTIVERT) 25 MG tablet Take 1/2-1 tablet by mouth every 4 hours as needed for dizziness    . Multiple Vitamins-Minerals (MULTIVITAL) tablet Take 1 tablet by mouth daily.      . Omega-3 Fatty Acids (FISH  OIL) 1000 MG CAPS Take 1 capsule by mouth daily.      . ondansetron (ZOFRAN-ODT) 4 MG disintegrating tablet Take 1 tablet (4 mg total) by mouth every 8 (eight) hours as needed for nausea (with vertigo). 30 tablet 1  . predniSONE (DELTASONE) 5 MG tablet TAKE 1 TABLET BY MOUTH DAILY WITH BREAKFAST 100 tablet 3  . Probiotic Product (ALIGN PO) Take by mouth daily.    . simvastatin (ZOCOR) 20 MG tablet Take 1 tablet (20 mg total) by mouth at bedtime. 90 tablet 3  . sucralfate (CARAFATE) 1 g tablet Take 1 tablet (1 g total) by mouth 2 (two) times daily as needed. 60 tablet 2   No facility-administered medications prior to visit.     Review of Systems  Constitutional: Negative for chills, diaphoresis, fever, malaise/fatigue and weight loss.  HENT: Negative for congestion, ear pain and sore throat.   Respiratory: Negative for cough, hemoptysis, sputum production, shortness of breath and wheezing.   Cardiovascular: Negative for chest pain, palpitations and leg swelling.  Gastrointestinal: Negative for abdominal pain, heartburn and nausea.  Genitourinary: Negative for frequency.  Musculoskeletal: Negative for joint pain and myalgias.  Skin: Negative for itching and rash.  Neurological: Negative for dizziness, weakness and headaches.  Endo/Heme/Allergies: Does not bruise/bleed easily.  Psychiatric/Behavioral: Negative for depression. The patient is not nervous/anxious.       Objective:  Physical Exam   Vitals:   01/08/19 1100  BP: (!) 180/90  Pulse: 95  SpO2: 97%  Weight: 173 lb 3.2 oz (78.6 kg)  Height: 5\' 8"  (1.727 m)    Physical Exam: General: Elderly, well-appearing, no acute distress HENT: [place], AT, OP clear, MMM Eyes: EOMI, no scleral icterus Respiratory: Clear to auscultation bilaterally.  No crackles, wheezing or rales Cardiovascular: RRR, -M/R/G, no JVD Extremities:-Edema,-tenderness Neuro: AAO x4, CNII-XII grossly intact Skin: Intact, no rashes or bruising Psych: Normal  mood, normal affect  Chest imaging: CXR 07/03/2018-bilateral hilar prominence, unchanged compared to prior  PFT: 01/06/2016- mild obstructive defect FEV1 77%. Normal TLC. Mild reduced uncorrected DLCO  I have personally reviewed the above labs, images and tests noted above.    Assessment & Plan:   Sarcoidosis  Discussion: 78 year old female with sarcoidosis.  Previously seen by Dr. Lenna Gilford for 40 years.  Asymptomatic on chronic prednisone 5 mg daily.  For your sarcoidosis: --CXR today --Follow-up via virtual visit in 3 months --Follow-up in-office at 6 months with blood draw --Discussed proper hygiene and masking as patient may potentially be at higher risk for viral infections including COVID-19.   Alyric Parkin Rodman Pickle, MD [facility] 01/08/2019 11:08 AM

## 2019-01-13 ENCOUNTER — Encounter: Payer: Medicare Other | Admitting: Internal Medicine

## 2019-02-09 ENCOUNTER — Other Ambulatory Visit: Payer: Self-pay | Admitting: Family Medicine

## 2019-02-09 DIAGNOSIS — K219 Gastro-esophageal reflux disease without esophagitis: Secondary | ICD-10-CM

## 2019-02-18 ENCOUNTER — Telehealth: Payer: Self-pay | Admitting: Internal Medicine

## 2019-02-18 NOTE — Telephone Encounter (Signed)

## 2019-02-18 NOTE — Telephone Encounter (Signed)
No to all answers. °

## 2019-02-19 ENCOUNTER — Other Ambulatory Visit: Payer: Self-pay

## 2019-02-19 ENCOUNTER — Ambulatory Visit (AMBULATORY_SURGERY_CENTER): Payer: Medicare Other | Admitting: Internal Medicine

## 2019-02-19 ENCOUNTER — Encounter: Payer: Self-pay | Admitting: Internal Medicine

## 2019-02-19 VITALS — BP 124/80 | HR 71 | Temp 97.7°F | Resp 18 | Ht 68.0 in | Wt 170.0 lb

## 2019-02-19 DIAGNOSIS — K3189 Other diseases of stomach and duodenum: Secondary | ICD-10-CM | POA: Diagnosis not present

## 2019-02-19 DIAGNOSIS — K2970 Gastritis, unspecified, without bleeding: Secondary | ICD-10-CM | POA: Diagnosis not present

## 2019-02-19 DIAGNOSIS — K317 Polyp of stomach and duodenum: Secondary | ICD-10-CM

## 2019-02-19 DIAGNOSIS — Z86010 Personal history of colonic polyps: Secondary | ICD-10-CM | POA: Diagnosis not present

## 2019-02-19 DIAGNOSIS — K2100 Gastro-esophageal reflux disease with esophagitis, without bleeding: Secondary | ICD-10-CM

## 2019-02-19 DIAGNOSIS — D123 Benign neoplasm of transverse colon: Secondary | ICD-10-CM | POA: Diagnosis not present

## 2019-02-19 DIAGNOSIS — D122 Benign neoplasm of ascending colon: Secondary | ICD-10-CM | POA: Diagnosis not present

## 2019-02-19 DIAGNOSIS — D120 Benign neoplasm of cecum: Secondary | ICD-10-CM

## 2019-02-19 MED ORDER — SODIUM CHLORIDE 0.9 % IV SOLN: 500.0000 mL | Freq: Once | INTRAVENOUS | Status: DC

## 2019-02-19 MED ORDER — ESOMEPRAZOLE MAGNESIUM 40 MG PO CPDR: 40.0000 mg | DELAYED_RELEASE_CAPSULE | Freq: Two times a day (BID) | ORAL | 1 refills | Status: DC

## 2019-02-19 NOTE — Op Note (Signed)
[place] Patient Name: Laura [name] Procedure Date: 02/19/2019 7:43 AM MRN: [id number] Endoscopist: Jerene Bears , MD Age: 79 Referring MD:  Date of Birth: 03-06-1941 Gender: Female Account #: [id number] Procedure:                Upper GI endoscopy Indications:              Gastro-esophageal reflux disease Medicines:                Monitored Anesthesia Care Procedure:                Pre-Anesthesia Assessment:                           - Prior to the procedure, a History and Physical                            was performed, and patient medications and                            allergies were reviewed. The patient's tolerance of                            previous anesthesia was also reviewed. The risks                            and benefits of the procedure and the sedation                            options and risks were discussed with the patient.                            All questions were answered, and informed consent                            was obtained. Prior Anticoagulants: The patient has                            taken no previous anticoagulant or antiplatelet                            agents. ASA Grade Assessment: III - A patient with                            severe systemic disease. After reviewing the risks                            and benefits, the patient was deemed in                            satisfactory condition to undergo the procedure.                           After obtaining informed consent, the endoscope was  passed under direct vision. Throughout the                            procedure, the patient's blood pressure, pulse, and                            oxygen saturations were monitored continuously. The                            Endoscope was introduced through the mouth, and                            advanced to the second part of duodenum. The upper                            GI endoscopy was  accomplished without difficulty.                            The patient tolerated the procedure well. Scope In: Scope Out: Findings:                 The examined esophagus was normal.                           A single 7 mm sessile polyp with friability and                            oozing was found in the cardia. The polyp was                            removed with a hot snare. Resection and retrieval                            were complete.                           Diffuse mildly erythematous mucosa without bleeding                            was found in the gastric body and in the gastric                            antrum. Biopsies were taken with a cold forceps for                            histology and Helicobacter pylori testing.                           The examined duodenum was normal. Complications:            No immediate complications. Estimated Blood Loss:     Estimated blood loss was minimal. Impression:               - Normal esophagus.                           -  A single gastric polyp. Resected and retrieved.                           - Erythematous mucosa in the gastric body and                            antrum. Biopsied.                           - Normal examined duodenum. Recommendation:           - Patient has a contact number available for                            emergencies. The signs and symptoms of potential                            delayed complications were discussed with the                            patient. Return to normal activities tomorrow.                            Written discharge instructions were provided to the                            patient.                           - Resume previous diet.                           - Continue present medications. Okay to use Nexium                            40 mg twice daily (given improved in symptoms on                            this dose compared to once daily).                            - Await pathology results. Jerene Bears, MD 02/19/2019 8:21:28 AM This report has been signed electronically.

## 2019-02-19 NOTE — Progress Notes (Signed)
Report given to PACU, vss 

## 2019-02-19 NOTE — Patient Instructions (Signed)
Please read all of the handouts given to you by your recovery room nurse.   Take your nexium twice daily before meals.  Use benefiber as directed  daily to help improve bowel movements.   YOU HAD AN ENDOSCOPIC PROCEDURE TODAY AT [facility]:   Refer to the procedure report that was given to you for any specific questions about what was found during the examination.  If the procedure report does not answer your questions, please call your gastroenterologist to clarify.  If you requested that your care partner not be given the details of your procedure findings, then the procedure report has been included in a sealed envelope for you to review at your convenience later.  YOU SHOULD EXPECT: Some feelings of bloating in the abdomen. Passage of more gas than usual.  Walking can help get rid of the air that was put into your GI tract during the procedure and reduce the bloating. If you had a lower endoscopy (such as a colonoscopy or flexible sigmoidoscopy) you may notice spotting of blood in your stool or on the toilet paper. If you underwent a bowel prep for your procedure, you may not have a normal bowel movement for a few days.  Please Note:  You might notice some irritation and congestion in your nose or some drainage.  This is from the oxygen used during your procedure.  There is no need for concern and it should clear up in a day or so.  SYMPTOMS TO REPORT IMMEDIATELY:   Following lower endoscopy (colonoscopy or flexible sigmoidoscopy):  Excessive amounts of blood in the stool  Significant tenderness or worsening of abdominal pains  Swelling of the abdomen that is new, acute  Fever of 100F or higher   Following upper endoscopy (EGD)  Vomiting of blood or coffee ground material  New chest pain or pain under the shoulder blades  Painful or persistently difficult swallowing  New shortness of breath  Fever of 100F or higher  Black, tarry-looking stools  For urgent or emergent  issues, a gastroenterologist can be reached at any hour by calling [phone].   DIET:  We do recommend a small meal at first, but then you may proceed to your regular diet.  Drink plenty of fluids but you should avoid alcoholic beverages for 24 hours.  ACTIVITY:  You should plan to take it easy for the rest of today and you should NOT DRIVE or use heavy machinery until tomorrow (because of the sedation medicines used during the test).    FOLLOW UP: Our staff will call the number listed on your records 48-72 hours following your procedure to check on you and address any questions or concerns that you may have regarding the information given to you following your procedure. If we do not reach you, we will leave a message.  We will attempt to reach you two times.  During this call, we will ask if you have developed any symptoms of COVID 19. If you develop any symptoms (ie: fever, flu-like symptoms, shortness of breath, cough etc.) before then, please call [phone].  If you test positive for Covid 19 in the 2 weeks post procedure, please call and report this information to [place].    If any biopsies were taken you will be contacted by phone or by letter within the next 1-3 weeks.  Please call us at [phone] if you have not heard about the biopsies in 3 weeks.    SIGNATURES/CONFIDENTIALITY: You and/or  your care partner have signed paperwork which will be entered into your electronic medical record.  These signatures attest to the fact that that the information above on your After Visit Summary has been reviewed and is understood.  Full responsibility of the confidentiality of this discharge information lies with you and/or your care-partner.

## 2019-02-19 NOTE — Op Note (Signed)
[place] Patient Name: Laura [name] Procedure Date: 02/19/2019 7:43 AM MRN: [id number] Endoscopist: Jerene Bears , MD Age: 78 Referring MD:  Date of Birth: 1941/06/11 Gender: Female Account #: [id number] Procedure:                Colonoscopy Indications:              High risk colon cancer surveillance: Personal                            history of sessile serrated colon polyps (less than                            10 mm in size) with no dysplasia, Last colonoscopy                            3 years ago; family history of colon cancer in                            sister Medicines:                Monitored Anesthesia Care Procedure:                Pre-Anesthesia Assessment:                           - Prior to the procedure, a History and Physical                            was performed, and patient medications and                            allergies were reviewed. The patient's tolerance of                            previous anesthesia was also reviewed. The risks                            and benefits of the procedure and the sedation                            options and risks were discussed with the patient.                            All questions were answered, and informed consent                            was obtained. Prior Anticoagulants: The patient has                            taken no previous anticoagulant or antiplatelet                            agents. ASA Grade Assessment: II - A patient with  mild systemic disease. After reviewing the risks                            and benefits, the patient was deemed in                            satisfactory condition to undergo the procedure.                           After obtaining informed consent, the colonoscope                            was passed under direct vision. Throughout the                            procedure, the patient's blood pressure, pulse, and                             oxygen saturations were monitored continuously. The                            Colonoscope was introduced through the anus and                            advanced to the cecum, identified by appendiceal                            orifice and ileocecal valve. The colonoscopy was                            performed without difficulty. The patient tolerated                            the procedure well. The quality of the bowel                            preparation was good. The ileocecal valve,                            appendiceal orifice, and rectum were photographed. Scope In: 7:55:20 AM Scope Out: 8:16:33 AM Scope Withdrawal Time: 0 hours 15 minutes 7 seconds  Total Procedure Duration: 0 hours 21 minutes 13 seconds  Findings:                 The digital rectal exam was normal.                           Two sessile polyps were found in the cecum. The                            polyps were 4 to 5 mm in size. These polyps were                            removed with a cold snare.  Resection and retrieval                            were complete.                           Two sessile polyps were found in the ascending                            colon. The polyps were 2 to 5 mm in size. These                            polyps were removed with a cold snare. Resection                            and retrieval were complete.                           Two sessile polyps were found in the transverse                            colon. The polyps were 3 to 4 mm in size. These                            polyps were removed with a cold snare. Resection                            and retrieval were complete.                           Multiple small-mouthed diverticula were found in                            the sigmoid colon and descending colon.                           Internal hemorrhoids were found during                            retroflexion. The hemorrhoids were  small. Complications:            No immediate complications. Estimated Blood Loss:     Estimated blood loss was minimal. Impression:               - Two 4 to 5 mm polyps in the cecum, removed with a                            cold snare. Resected and retrieved.                           - Two 2 to 5 mm polyps in the ascending colon,                            removed with a cold snare. Resected and retrieved.                           -  Two 3 to 4 mm polyps in the transverse colon,                            removed with a cold snare. Resected and retrieved.                           - Diverticulosis in the sigmoid colon and in the                            descending colon.                           - Small internal hemorrhoids. Recommendation:           - Patient has a contact number available for                            emergencies. The signs and symptoms of potential                            delayed complications were discussed with the                            patient. Return to normal activities tomorrow.                            Written discharge instructions were provided to the                            patient.                           - Resume previous diet.                           - Continue present medications.                           - Benefiber 1-2 tablespoons daily can help improve                            bowel regularity and aid with more complete bowel                            movements.                           - Await pathology results.                           - No recommendation at this time regarding repeat                            colonoscopy due to age >80 years at next screening  interval (we can discuss this in 3 years when                            colonoscopy would be considered). Jerene Bears, MD 02/19/2019 8:28:49 AM This report has been signed electronically.

## 2019-02-19 NOTE — Addendum Note (Signed)
Addended by: Ernestine Conrad D on: 02/19/2019 02:47 PM   Modules accepted: Orders

## 2019-02-19 NOTE — Progress Notes (Signed)
Called to room to assist during endoscopic procedure.  Patient ID and intended procedure confirmed with present staff. Received instructions for my participation in the procedure from the performing physician.  

## 2019-02-23 ENCOUNTER — Telehealth: Payer: Self-pay

## 2019-02-23 NOTE — Telephone Encounter (Signed)
Covid-19 screening questions   Do you now or have you had a fever in the last 14 days? No.  Do you have any respiratory symptoms of shortness of breath or cough now or in the last 14 days? No.  Do you have any family members or close contacts with diagnosed or suspected Covid-19 in the past 14 days? No.  Have you been tested for Covid-19 and found to be positive? No.       Follow up Call-  Call back number 02/19/2019  Post procedure Call Back phone  # [phone]  Permission to leave phone message Yes  Some recent data might be hidden     Patient questions:  Do you have a fever, pain , or abdominal swelling? No. Pain Score  0 *  Have you tolerated food without any problems? Yes.    Have you been able to return to your normal activities? Yes.    Do you have any questions about your discharge instructions: Diet   No. Medications  No. Follow up visit  No.  Do you have questions or concerns about your Care? No.  Pt. Noted she had vomiting with the prep prior to the procedure, but has returned to her normal diet and activities post procedure.  Actions: * If pain score is 4 or above: No action needed, pain <4.

## 2019-02-23 NOTE — Telephone Encounter (Signed)
Left message on follow up call. 

## 2019-02-26 ENCOUNTER — Encounter: Payer: Self-pay | Admitting: Internal Medicine

## 2019-03-02 ENCOUNTER — Other Ambulatory Visit: Payer: Self-pay | Admitting: Family Medicine

## 2019-03-21 ENCOUNTER — Telehealth: Payer: Medicare Other | Admitting: Family

## 2019-03-21 DIAGNOSIS — R300 Dysuria: Secondary | ICD-10-CM | POA: Diagnosis not present

## 2019-03-21 MED ORDER — NITROFURANTOIN MONOHYD MACRO 100 MG PO CAPS: 100.0000 mg | ORAL_CAPSULE | Freq: Two times a day (BID) | ORAL | 0 refills | Status: DC

## 2019-03-21 NOTE — Progress Notes (Signed)

## 2019-03-23 ENCOUNTER — Telehealth: Payer: Self-pay | Admitting: Family Medicine

## 2019-03-23 NOTE — Telephone Encounter (Signed)
Patient had declined appointment. Patient stated that she was prescribed some medication yesterday and wants to wait a few days to see if it clears up. I told the patient to keep [place] updated.

## 2019-03-23 NOTE — Telephone Encounter (Signed)
Please contact pt to schedule at 2:00 today if no Covid sx. Thanks!

## 2019-03-23 NOTE — Telephone Encounter (Signed)
Noted  

## 2019-03-23 NOTE — Telephone Encounter (Signed)
[facility] RECORD [facility] Patient Name: Laura [name] Gender: Female DOB: Mar 11, 1941 Age: 78 Y 16 M 18 D Return Phone Number: [phone] (Primary) Address: City/State/Zip: [place] [address] Client [facility] [place] Client [facility] Night Physician [name] Contact Type Call Who Is Calling Patient / Member / Family / Caregiver Call Type Triage / Clinical Relationship To Patient Self Return Phone Number [phone] (Primary) Chief Complaint Abdominal Pain Reason for Call Symptomatic / Request for Laura [name] states she is confident that she has a UTI, she has frequent urination and abd pain. [facility] telehealth information sent Translation No Nurse Assessment Nurse: Alveda Reasons, RN, Tammy Date/Time (Eastern Time): 03/21/2019 5:51:08 PM Confirm and document reason for call. If symptomatic, describe symptoms. ---Caller states she is confident that she has a UTI, she has frequent urination and abd pain. Pain with urination, fatigue, no fever. Has the patient had close contact with a person known or suspected to have the novel coronavirus illness OR traveled / lives in area with major community spread (including international travel) in the last 14 days from the onset of symptoms? * If Asymptomatic, screen for exposure and travel within the last 14 days. ---No Does the patient have any new or worsening symptoms? ---Yes Will a triage be completed? ---Yes Related visit to physician within the last 2 weeks? ---No Does the PT have any chronic conditions? (i.e. diabetes, asthma, this includes High risk factors for pregnancy, etc.) ---No Is this a behavioral health or substance abuse call? ---No Guidelines Guideline Title Affirmed Question Affirmed Notes Nurse Date/Time  Eilene Ghazi Time) Abdominal Pain - Female [1] MODERATE pain (e.g., interferes with normal activities) AND [2] pain comes and goes (cramps) AND [3] present > 24 hours (Exception: pain with Vomiting [place], RN, Tammy 03/21/2019 5:53:05 PM PLEASE NOTE: All timestamps contained within this report are represented as [place] Standard Time. CONFIDENTIALTY NOTICE: This fax transmission is intended only for the addressee. It contains information that is legally privileged, confidential or otherwise protected from use or disclosure. If you are not the intended recipient, you are strictly prohibited from reviewing, disclosing, copying using or disseminating any of this information or taking any action in reliance on or regarding this information. If you have received this fax in error, please notify us immediately by telephone so that we can arrange for its return to [place]. Phone: [phone], Toll-Free: [phone], Fax: [phone] Page: 2 of 2 Call Id: [id number] Guidelines Guideline Title Affirmed Question Affirmed Notes Nurse Date/Time Eilene Ghazi Time) or Diarrhea - see that Guideline) Disp. Time Eilene Ghazi Time) Disposition Final User 03/21/2019 6:10:56 PM See PCP within 24 Hours Yes Alveda Reasons, RN, Tammy Caller Disagree/Comply Comply Caller Understands Yes PreDisposition Did not know what to do Care Advice Given Per Guideline SEE PCP WITHIN 24 HOURS: * IF OFFICE WILL BE OPEN: You need to be seen within the next 24 hours. Call your doctor (or NP/PA) when the office opens and make an appointment. * IF OFFICE WILL BE CLOSED AND NO PCP (PRIMARY CARE PROVIDER) SECOND-LEVEL TRIAGE: You need to be seen within the next 24 hours. A clinic or an urgent care center is often a good source of care if your doctor's office is closed or you can't get an appointment. DIET: * Drink adequate fluids. Eat a bland diet. * Avoid alcohol or caffeinated beverages CALL BACK IF: *  Severe pain lasts over 1 hour * Constant pain  lasts over 2 hours * You become worse. CARE ADVICE given per Abdominal Pain, Female (Adult) guideline [facility] telemedicine information emailed to caller's daughter per request. Caller unwilling/unable to go to [facility] or ED for treatment and daughter available to assist with telehealth visit. After Care Instructions Given Call Event Type User Date / Time Description Education document email Donah Driver, [place] 03/21/2019 6:06:23 PM Michaelene Song

## 2019-04-06 ENCOUNTER — Telehealth: Payer: Self-pay | Admitting: Family Medicine

## 2019-04-06 NOTE — Telephone Encounter (Signed)
I spoke with the patient to schedule AWVs for both her and her husband.  Because of his health concerns, she would like to call me back around the middle of October before scheduling any additional appointments.  She would prefer to have it via telephone call if possible. VDM (DD)

## 2019-04-10 ENCOUNTER — Ambulatory Visit (INDEPENDENT_AMBULATORY_CARE_PROVIDER_SITE_OTHER): Payer: Medicare Other | Admitting: Primary Care

## 2019-04-10 ENCOUNTER — Other Ambulatory Visit: Payer: Self-pay

## 2019-04-10 ENCOUNTER — Ambulatory Visit: Payer: Medicare Other | Admitting: Nurse Practitioner

## 2019-04-10 ENCOUNTER — Encounter: Payer: Self-pay | Admitting: Primary Care

## 2019-04-10 DIAGNOSIS — D869 Sarcoidosis, unspecified: Secondary | ICD-10-CM | POA: Diagnosis not present

## 2019-04-10 NOTE — Patient Instructions (Addendum)
  Follow-up:  3 months with Dr. Loanne Drilling

## 2019-04-10 NOTE — Progress Notes (Signed)
Virtual Visit via Telephone Note  I connected with Laura [name] on 04/10/19 at 11:00 AM EDT by telephone and verified that I am speaking with the correct person using two identifiers.  Location: Patient: Home Provider: Office    I discussed the limitations, risks, security and privacy concerns of performing an evaluation and management service by telephone and the availability of in person appointments. I also discussed with the patient that there may be a patient responsible charge related to this service. The patient expressed understanding and agreed to proceed.   History of Present Illness: 78 year old female, never smoked.  Past medical history significant for sarcoidosis, GERD, anxiety, skin cancer. Patient of Dr. Loanne Drilling, last seen 01/08/2019 (former Dr. Lenna Gilford patient for 40 years). On chronic prednisone 5 mg daily.    04/10/2019 Patient contacted today for 3 month fu sarcoid/ televisit. She is doing well, no acute symptoms. She has some degree of shortness of breath but this is not new. Reports that she has been unable to exercise d/t covid restrictions and she can see a difference. She is doing what she can on her own and walking outside which is limited by heat/humidity. She was in a program where she exercised three times a week. Her husband has a stationary bike (with seat back) that she is open to trying. She has an apt in October with her PCP and plans to receive her flu shot then. Denies chest pain or cough.    Chest imaging: CXR 12/2018- no significant interval change in the hilar/perihilar lung opacities  CXR 07/03/2018-bilateral hilar prominence, unchanged compared to prior  PFT: 01/06/2016- mild obstructive defect FEV1 77%. Normal TLC. Mild reduced uncorrected DLCO   Observations/Objective:  - No shortness of breath, wheezing or cough during phone conversation  Assessment and Plan:  Sarcoidosis - Stable interval - Continue prednisone 5mg  daily  - CXR in June  showed no significant interval change in the hilar/perihilar lung opacities  - Getting flu shot with PCP in October   Follow Up Instructions:   - FU in 3 months with Dr. Loanne Drilling  I discussed the assessment and treatment plan with the patient. The patient was provided an opportunity to ask questions and all were answered. The patient agreed with the plan and demonstrated an understanding of the instructions.   The patient was advised to call back or seek an in-person evaluation if the symptoms worsen or if the condition fails to improve as anticipated.  I provided 15 minutes of non-face-to-face time during this encounter.   Martyn Ehrich, NP

## 2019-04-16 DIAGNOSIS — D485 Neoplasm of uncertain behavior of skin: Secondary | ICD-10-CM | POA: Diagnosis not present

## 2019-04-16 DIAGNOSIS — L820 Inflamed seborrheic keratosis: Secondary | ICD-10-CM | POA: Diagnosis not present

## 2019-04-20 ENCOUNTER — Ambulatory Visit: Payer: Medicare Other | Admitting: Family Medicine

## 2019-04-20 NOTE — Patient Instructions (Addendum)
Health Maintenance Due  Topic Date Due  . PNA vac Low Risk Adult (2 of 2 - PCV13) 05/18/2011  . INFLUENZA VACCINE  02/21/2019  . URINE MICROALBUMIN  05/09/2019   Depression screen [facility] 2/9 11/10/2018 12/12/2017 11/05/2017  Decreased Interest 0 0 0  Down, Depressed, Hopeless 0 0 0  PHQ - 2 Score 0 0 0

## 2019-04-20 NOTE — Progress Notes (Signed)
Patient canceled visit after pre-charting had begun.

## 2019-04-24 ENCOUNTER — Ambulatory Visit (INDEPENDENT_AMBULATORY_CARE_PROVIDER_SITE_OTHER): Payer: Medicare Other | Admitting: Family Medicine

## 2019-04-27 DIAGNOSIS — Z1231 Encounter for screening mammogram for malignant neoplasm of breast: Secondary | ICD-10-CM | POA: Diagnosis not present

## 2019-05-27 DIAGNOSIS — H35373 Puckering of macula, bilateral: Secondary | ICD-10-CM | POA: Diagnosis not present

## 2019-05-27 DIAGNOSIS — H26493 Other secondary cataract, bilateral: Secondary | ICD-10-CM | POA: Diagnosis not present

## 2019-05-27 DIAGNOSIS — H524 Presbyopia: Secondary | ICD-10-CM | POA: Diagnosis not present

## 2019-05-27 DIAGNOSIS — H5213 Myopia, bilateral: Secondary | ICD-10-CM | POA: Diagnosis not present

## 2019-05-27 DIAGNOSIS — Z961 Presence of intraocular lens: Secondary | ICD-10-CM | POA: Diagnosis not present

## 2019-05-31 ENCOUNTER — Other Ambulatory Visit: Payer: Self-pay | Admitting: Family Medicine

## 2019-06-01 NOTE — Telephone Encounter (Signed)
Rx request 

## 2019-06-08 ENCOUNTER — Ambulatory Visit: Payer: Self-pay | Admitting: *Deleted

## 2019-06-08 NOTE — Telephone Encounter (Signed)
I was exposed Saturday to COVID-19. (Nov. 14)  When should I be tested?   I've heard it's between 5-7 days.    I let her know that was correct unless she starts showing/having symptoms prior to the 5th day.   She denies having any symptoms now.   I did let her know she would need to quarantine until 06/19/2019, 14 days from exposure to observe for symptoms.  Same for her husband.  I answered her questions.  I let her know the signs and symptoms to watch for.  Especially to seek medical care for shortness of breath and/or chest pressure/discomfort.  They plan on being tested on Nov. 19 or 20th unless symptoms appear prior to then.  She verbalized understanding.

## 2019-06-12 ENCOUNTER — Ambulatory Visit: Payer: Medicare Other | Admitting: Family Medicine

## 2019-06-26 ENCOUNTER — Telehealth: Payer: Self-pay | Admitting: Family Medicine

## 2019-06-26 NOTE — Telephone Encounter (Signed)
Copied from [place] [id number]. Topic: General - Other >> Jun 26, 2019 12:09 PM Virl Axe D wrote: Reason for CRM: Pt stated her Covid -19 results were negative.

## 2019-06-26 NOTE — Telephone Encounter (Signed)
FYI

## 2019-06-27 NOTE — Telephone Encounter (Signed)
That is great news

## 2019-07-02 DIAGNOSIS — Z961 Presence of intraocular lens: Secondary | ICD-10-CM | POA: Diagnosis not present

## 2019-07-02 DIAGNOSIS — H43813 Vitreous degeneration, bilateral: Secondary | ICD-10-CM | POA: Diagnosis not present

## 2019-07-02 DIAGNOSIS — H35372 Puckering of macula, left eye: Secondary | ICD-10-CM | POA: Diagnosis not present

## 2019-07-02 DIAGNOSIS — H35341 Macular cyst, hole, or pseudohole, right eye: Secondary | ICD-10-CM | POA: Diagnosis not present

## 2019-07-02 DIAGNOSIS — H30033 Focal chorioretinal inflammation, peripheral, bilateral: Secondary | ICD-10-CM | POA: Diagnosis not present

## 2019-07-27 ENCOUNTER — Ambulatory Visit (INDEPENDENT_AMBULATORY_CARE_PROVIDER_SITE_OTHER): Payer: Medicare Other

## 2019-07-27 ENCOUNTER — Ambulatory Visit: Payer: Medicare Other | Admitting: Pulmonary Disease

## 2019-07-27 ENCOUNTER — Encounter: Payer: Self-pay | Admitting: Pulmonary Disease

## 2019-07-27 ENCOUNTER — Other Ambulatory Visit: Payer: Self-pay

## 2019-07-27 VITALS — BP 126/64 | HR 73 | Temp 97.2°F | Ht 68.0 in | Wt 174.9 lb

## 2019-07-27 DIAGNOSIS — D869 Sarcoidosis, unspecified: Secondary | ICD-10-CM

## 2019-07-27 LAB — COMPREHENSIVE METABOLIC PANEL
ALT: 18 U/L (ref 0–35)
AST: 19 U/L (ref 0–37)
Albumin: 4.2 g/dL (ref 3.5–5.2)
Alkaline Phosphatase: 80 U/L (ref 39–117)
BUN: 19 mg/dL (ref 6–23)
CO2: 30 mEq/L (ref 19–32)
Calcium: 9.7 mg/dL (ref 8.4–10.5)
Chloride: 101 mEq/L (ref 96–112)
Creatinine, Ser: 0.75 mg/dL (ref 0.40–1.20)
GFR: 74.55 mL/min (ref >60.00)
Glucose, Bld: 107 mg/dL — ABNORMAL HIGH (ref 70–99)
Potassium: 3.7 mEq/L (ref 3.5–5.1)
Sodium: 140 mEq/L (ref 135–145)
Total Bilirubin: 0.8 mg/dL (ref 0.2–1.2)
Total Protein: 7.2 g/dL (ref 6.0–8.3)

## 2019-07-27 LAB — CBC WITH DIFFERENTIAL/PLATELET
Basophils Absolute: 0 10*3/uL (ref 0.0–0.1)
Basophils Relative: 0.3 % (ref 0.0–3.0)
Eosinophils Absolute: 0 10*3/uL (ref 0.0–0.7)
Eosinophils Relative: 0.4 % (ref 0.0–5.0)
HCT: 46.3 % — ABNORMAL HIGH (ref 36.0–46.0)
Hemoglobin: 15.3 g/dL — ABNORMAL HIGH (ref 12.0–15.0)
Lymphocytes Relative: 10.5 % — ABNORMAL LOW (ref 12.0–46.0)
Lymphs Abs: 1.2 10*3/uL (ref 0.7–4.0)
MCHC: 33 g/dL (ref 30.0–36.0)
MCV: 93 fl (ref 78.0–100.0)
Monocytes Absolute: 0.6 10*3/uL (ref 0.1–1.0)
Monocytes Relative: 4.9 % (ref 3.0–12.0)
Neutro Abs: 9.9 10*3/uL — ABNORMAL HIGH (ref 1.4–7.7)
Neutrophils Relative %: 83.9 % — ABNORMAL HIGH (ref 43.0–77.0)
Platelets: 278 10*3/uL (ref 150.0–400.0)
RBC: 4.97 Mil/uL (ref 3.87–5.11)
RDW: 13.8 % (ref 11.5–15.5)
WBC: 11.8 10*3/uL — ABNORMAL HIGH (ref 4.0–10.5)

## 2019-07-27 MED ORDER — PREDNISONE 5 MG PO TABS: ORAL_TABLET | ORAL | 3 refills | Status: DC

## 2019-07-27 NOTE — Progress Notes (Signed)
Subjective:   PATIENT ID: Laura [name] GENDER: female DOB: Feb 05, 1941, MRN: [id number]   HPI  Chief Complaint  Patient presents with  . Follow-up    3 month rov sarcoidosis.  pt c/o stable sob with exertion, states this is due to lack of activity.     [name] is a 79 year old female never smoker with sarcoidosis who presents for follow-up.  She was initially diagnosed with Dr. Lou Miner in 2017 and started on a prednisone taper at that time.  Since joining our clinic she has been followed by Dr. Lenna Gilford.  She has stable dyspnea on exertion.  She has not been able to be as active during the pandemic.  Previously she participated in program, exercising 3 times a week.  Since being home she has been walking around the neighborhood with husband who was diagnosed with Parkinsons. They will walk up to 2 miles a day when weather permits.  Denies cough, wheezing, chest pain or palpitations.  I have personally reviewed patient's past medical/family/social history/allergies/current medications.  Outpatient Medications Prior to Visit  Medication Sig Dispense Refill  . amLODipine (NORVASC) 5 MG tablet TAKE 1 TABLET(5 MG) BY MOUTH DAILY 90 tablet 3  . B Complex Vitamins (VITAMIN-B COMPLEX PO) Take 1 capsule by mouth daily.     . Calcium Carbonate (CALTRATE 600 PO) Take 1 tablet by mouth 2 (two) times daily.      . cholecalciferol (VITAMIN D) 1000 units tablet Take 1,000 Units by mouth daily.    [name] esomeprazole (NEXIUM) 40 MG capsule Take 1 capsule (40 mg total) by mouth 2 (two) times daily before a meal. 120 capsule 1  . Glucosamine HCl (GLUCOSAMINE PO) Liquid form - Take by mouth as directed once daily    . meclizine (ANTIVERT) 25 MG tablet Take 1/2-1 tablet by mouth every 4 hours as needed for dizziness    . Multiple Vitamins-Minerals (MULTIVITAL) tablet Take 1 tablet by mouth daily.      . nitrofurantoin, macrocrystal-monohydrate, (MACROBID) 100 MG capsule Take 1 capsule (100 mg total)  by mouth 2 (two) times daily. 10 capsule 0  . Omega-3 Fatty Acids (FISH OIL) 1000 MG CAPS Take 1 capsule by mouth daily.      . ondansetron (ZOFRAN-ODT) 4 MG disintegrating tablet Take 1 tablet (4 mg total) by mouth every 8 (eight) hours as needed for nausea (with vertigo). (Patient not taking: Reported on 02/19/2019) 30 tablet 1  . predniSONE (DELTASONE) 5 MG tablet TAKE 1 TABLET BY MOUTH DAILY WITH BREAKFAST 100 tablet 3  . Probiotic Product (ALIGN PO) Take by mouth daily.    . simvastatin (ZOCOR) 20 MG tablet TAKE 1 TABLET(20 MG) BY MOUTH AT BEDTIME 90 tablet 3  . sucralfate (CARAFATE) 1 g tablet TAKE 1 TABLET(1 GRAM) BY MOUTH TWICE DAILY AS NEEDED 60 tablet 2   No facility-administered medications prior to visit.    Review of Systems  Constitutional: Negative for chills, diaphoresis, fever, malaise/fatigue and weight loss.  HENT: Negative for congestion.   Respiratory: Positive for shortness of breath. Negative for cough, hemoptysis, sputum production and wheezing.   Cardiovascular: Negative for chest pain, palpitations and leg swelling.    Objective:   Vitals:   07/27/19 1159  BP: 126/64  Pulse: 73  SpO2: 96%   Physical Exam: General: Elderly-appearing, no acute distress HENT: [place], AT Eyes: EOMI, no scleral icterus Respiratory: Clear to auscultation bilaterally.  No crackles, wheezing or rales Cardiovascular: RRR, -M/R/G, no JVD Extremities:-Edema,-tenderness Neuro:  AAO x4, CNII-XII grossly intact Skin: Intact, no rashes or bruising Psych: Normal mood, normal affect  Interim Data Chest imaging: CXR 07/03/2018-bilateral hilar prominence, unchanged compared to prior CXR 01/08/19-unchanged hilar and perihilar lung opacities  PFT: 01/06/2016- mild obstructive defect FEV1 77%. Normal TLC. Mild reduced uncorrected DLCO  Imaging, labs and test noted above have been reviewed independently by me.    Assessment & Plan:   Discussion:  79 year old female never smoker with  sarcoidosis, choriorentinal inflammation and lamellar macular hold of right eye who presents for follow-up. Previously seen by Dr. Lenna Gilford for 40 years. Stable on chronic prednisone 5 mg daily. Hx of negative PPD. Will continue current prednisone for the time being in setting of chronic dyspnea. We discussed tapering and risk of worsening dyspnea. We also discussed the adverse effects of steroids including bone health. Patient ok with continuing prednisone for now  Pulmonary sarcoidosis - CXR today. Overall stable. - Continue prednisone 5 mg daily - Will arrange for annual PFTs.  Last PFTs 12/2015. Mild obstructive defect with mildly reduced DLCO - Recommend annual ophthalmology exam.  Last visit on 07/12/19. - EKG today reviewed. No evidence of conduction abnormalities. - Order CBC, CMP and urinary calcium  I have spent 35-minutes charting, coordinating care and discussing medical diagnosis, plan and counseling with the patient/family as noted above.  Naren Benally Rodman Pickle, MD [facility] 07/27/2019 11:56 AM

## 2019-07-27 NOTE — Patient Instructions (Signed)
Pulmonary sarcoidosis - CXR today. Overall stable. - Continue prednisone 5 mg daily - Will arrange for annual PFTs.  Last PFTs 12/2015. Mild obstructive defect with mildly reduced DLCO - Recommend annual ophthalmology exam.  Last visit on 07/12/19. - EKG today reviewed. No evidence of conduction abnormalities. - Order CBC, CMP and urinary calcium

## 2019-07-28 LAB — CALCIUM, URINE, RANDOM: CALCIUM, RANDOM URINE: 19.2 mg/dL

## 2019-08-14 ENCOUNTER — Telehealth: Payer: Self-pay | Admitting: Family Medicine

## 2019-08-14 NOTE — Telephone Encounter (Signed)
I called the pt to schedule AWV with Laura [name] for both her and spouse on 08/18/19 when they are here to see Laura [name].  There was no answer.

## 2019-08-18 ENCOUNTER — Ambulatory Visit (INDEPENDENT_AMBULATORY_CARE_PROVIDER_SITE_OTHER): Payer: Medicare Other | Admitting: Family Medicine

## 2019-08-18 ENCOUNTER — Other Ambulatory Visit: Payer: Self-pay

## 2019-08-18 ENCOUNTER — Encounter: Payer: Self-pay | Admitting: Family Medicine

## 2019-08-18 VITALS — BP 118/60 | HR 72 | Temp 98.0°F | Ht 68.0 in | Wt 172.8 lb

## 2019-08-18 DIAGNOSIS — I10 Essential (primary) hypertension: Secondary | ICD-10-CM

## 2019-08-18 DIAGNOSIS — K219 Gastro-esophageal reflux disease without esophagitis: Secondary | ICD-10-CM

## 2019-08-18 DIAGNOSIS — M810 Age-related osteoporosis without current pathological fracture: Secondary | ICD-10-CM | POA: Diagnosis not present

## 2019-08-18 DIAGNOSIS — R739 Hyperglycemia, unspecified: Secondary | ICD-10-CM

## 2019-08-18 DIAGNOSIS — K222 Esophageal obstruction: Secondary | ICD-10-CM

## 2019-08-18 DIAGNOSIS — T380X5A Adverse effect of glucocorticoids and synthetic analogues, initial encounter: Secondary | ICD-10-CM

## 2019-08-18 DIAGNOSIS — E785 Hyperlipidemia, unspecified: Secondary | ICD-10-CM

## 2019-08-18 DIAGNOSIS — D869 Sarcoidosis, unspecified: Secondary | ICD-10-CM

## 2019-08-18 LAB — MICROALBUMIN / CREATININE URINE RATIO
Creatinine,U: 82.9 mg/dL
Microalb Creat Ratio: 0.8 mg/g (ref 0.0–30.0)
Microalb, Ur: <0.7 mg/dL (ref 0.0–1.9)

## 2019-08-18 LAB — COMPREHENSIVE METABOLIC PANEL
ALT: 15 U/L (ref 0–35)
AST: 18 U/L (ref 0–37)
Albumin: 4.1 g/dL (ref 3.5–5.2)
Alkaline Phosphatase: 73 U/L (ref 39–117)
BUN: 16 mg/dL (ref 6–23)
CO2: 30 mEq/L (ref 19–32)
Calcium: 9.6 mg/dL (ref 8.4–10.5)
Chloride: 103 mEq/L (ref 96–112)
Creatinine, Ser: 0.72 mg/dL (ref 0.40–1.20)
GFR: 78.13 mL/min (ref >60.00)
Glucose, Bld: 117 mg/dL — ABNORMAL HIGH (ref 70–99)
Potassium: 3.7 mEq/L (ref 3.5–5.1)
Sodium: 139 mEq/L (ref 135–145)
Total Bilirubin: 0.8 mg/dL (ref 0.2–1.2)
Total Protein: 6.9 g/dL (ref 6.0–8.3)

## 2019-08-18 LAB — CBC WITH DIFFERENTIAL/PLATELET
Basophils Absolute: 0 10*3/uL (ref 0.0–0.1)
Basophils Relative: 0.3 % (ref 0.0–3.0)
Eosinophils Absolute: 0.1 10*3/uL (ref 0.0–0.7)
Eosinophils Relative: 0.7 % (ref 0.0–5.0)
HCT: 45.9 % (ref 36.0–46.0)
Hemoglobin: 15.3 g/dL — ABNORMAL HIGH (ref 12.0–15.0)
Lymphocytes Relative: 12 % (ref 12.0–46.0)
Lymphs Abs: 1.3 10*3/uL (ref 0.7–4.0)
MCHC: 33.3 g/dL (ref 30.0–36.0)
MCV: 93.1 fl (ref 78.0–100.0)
Monocytes Absolute: 0.6 10*3/uL (ref 0.1–1.0)
Monocytes Relative: 5.9 % (ref 3.0–12.0)
Neutro Abs: 8.5 10*3/uL — ABNORMAL HIGH (ref 1.4–7.7)
Neutrophils Relative %: 81.1 % — ABNORMAL HIGH (ref 43.0–77.0)
Platelets: 248 10*3/uL (ref 150.0–400.0)
RBC: 4.93 Mil/uL (ref 3.87–5.11)
RDW: 13.7 % (ref 11.5–15.5)
WBC: 10.5 10*3/uL (ref 4.0–10.5)

## 2019-08-18 LAB — HEMOGLOBIN A1C: Hgb A1c MFr Bld: 6.4 % (ref 4.6–6.5)

## 2019-08-18 LAB — LIPID PANEL
Cholesterol: 161 mg/dL (ref 0–200)
HDL: 45.4 mg/dL (ref >39.00)
NonHDL: 116.09
Total CHOL/HDL Ratio: 4
Triglycerides: 224 mg/dL — ABNORMAL HIGH (ref 0.0–149.0)
VLDL: 44.8 mg/dL — ABNORMAL HIGH (ref 0.0–40.0)

## 2019-08-18 LAB — LDL CHOLESTEROL, DIRECT: Direct LDL: 101 mg/dL

## 2019-08-18 NOTE — Assessment & Plan Note (Addendum)
S: Patient takes calcium and vitamin D.  Had been on bisphosphonate in the past but had significant worsening of reflux A/P: Patient is due for updated bone density-she agrees to this and this was ordered today.  Patient mention possibly Prolia-would also want to discuss Reclast option.  Continue calcium and vitamin D and try to increase exercise-weightbearing obviously more ideal

## 2019-08-18 NOTE — Assessment & Plan Note (Signed)
S: compliant with amlodipine 5mg  in morning. Discussed could take at night.  BP Readings from Last 3 Encounters:  08/18/19 118/60  07/27/19 126/64  02/19/19 124/80  A/P:  Stable. Continue current medications.  She will switch dosing ot nighttime as we discussed improved cardiovascular risk profile with nighttime blood pressure medications

## 2019-08-18 NOTE — Progress Notes (Signed)
Phone [phone] In person visit   Subjective:   Laura [name] is a 79 y.o. year old very pleasant female patient who presents for/with See problem oriented charting Chief Complaint  Patient presents with  . Follow-up  . Hypertension  . Hyperlipidemia   This visit occurred during the SARS-CoV-2 public health emergency.  Safety protocols were in place, including screening questions prior to the visit, additional usage of staff PPE, and extensive cleaning of exam room while observing appropriate contact time as indicated for disinfecting solutions.   Past Medical History-  Patient Active Problem List   Diagnosis Date Noted  . Steroid-induced hyperglycemia 10/18/2016    Priority: High  . Sarcoidosis 11/21/2011    Priority: High  . Essential hypertension 06/04/2017    Priority: Medium  . Hyperlipidemia 10/18/2016    Priority: Medium  . Osteoporosis 12/06/2014    Priority: Medium  . Dyspnea 08/20/2013    Priority: Medium  . GERD with stricture 10/12/2011    Priority: Medium  . Uveitis 09/21/2007    Priority: Medium  . Recurrent UTI 09/09/2007    Priority: Medium  . Anxiety state 08/06/2007    Priority: Medium  . History of colonic polyps 08/06/2007    Priority: Medium  . History of basal cell cancer 10/18/2016    Priority: Low  . Compression fx, lumbar spine ([place]) 09/24/2012    Priority: Low  . Osteoarthritis 09/21/2007    Priority: Low  . IBS (irritable bowel syndrome) 08/06/2007    Priority: Low  . LOW BACK PAIN, CHRONIC 08/06/2007    Priority: Low    Medications- reviewed and updated Current Outpatient Medications  Medication Sig Dispense Refill  . amLODipine (NORVASC) 5 MG tablet TAKE 1 TABLET(5 MG) BY MOUTH DAILY 90 tablet 3  . B Complex Vitamins (VITAMIN-B COMPLEX PO) Take 1 capsule by mouth daily.     . Calcium Carbonate (CALTRATE 600 PO) Take 1 tablet by mouth 2 (two) times daily.      . cholecalciferol (VITAMIN D) 1000 units tablet Take 1,000  Units by mouth daily.    [name] esomeprazole (NEXIUM) 40 MG capsule Take 1 capsule (40 mg total) by mouth 2 (two) times daily before a meal. 120 capsule 1  . Glucosamine HCl (GLUCOSAMINE PO) Liquid form - Take by mouth as directed once daily    . meclizine (ANTIVERT) 25 MG tablet Take 1/2-1 tablet by mouth every 4 hours as needed for dizziness    . Multiple Vitamins-Minerals (MULTIVITAL) tablet Take 1 tablet by mouth daily.      . Omega-3 Fatty Acids (FISH OIL) 1000 MG CAPS Take 1 capsule by mouth daily.      . ondansetron (ZOFRAN-ODT) 4 MG disintegrating tablet Take 1 tablet (4 mg total) by mouth every 8 (eight) hours as needed for nausea (with vertigo). 30 tablet 1  . predniSONE (DELTASONE) 5 MG tablet TAKE 1 TABLET BY MOUTH DAILY WITH BREAKFAST 90 tablet 3  . Probiotic Product (ALIGN PO) Take by mouth daily.    . simvastatin (ZOCOR) 20 MG tablet TAKE 1 TABLET(20 MG) BY MOUTH AT BEDTIME 90 tablet 3  . sucralfate (CARAFATE) 1 g tablet TAKE 1 TABLET(1 GRAM) BY MOUTH TWICE DAILY AS NEEDED 60 tablet 2   No current facility-administered medications for this visit.     Objective:  BP 118/60   Pulse 72   Temp 98 F (36.7 C)   Ht 5\' 8"  (1.727 m)   Wt 172 lb 12.8 oz (78.4 kg)  SpO2 97%   BMI 26.27 kg/m  Gen: NAD, resting comfortably CV: RRR no murmurs rubs or gallops Lungs: CTAB no crackles, wheeze, rhonchi Ext: no edema Skin: warm, dry    Assessment and Plan   #hypertension S: compliant with amlodipine 5mg  in morning. Discussed could take at night.  BP Readings from Last 3 Encounters:  08/18/19 118/60  07/27/19 126/64  02/19/19 124/80  A/P:  Stable. Continue current medications.  She will switch dosing ot nighttime as we discussed improved cardiovascular risk profile with nighttime blood pressure medications  #hyperlipidemia S: compliant with simvastatin 20mg  Lab Results  Component Value Date   CHOL 161 08/18/2019   HDL 45.40 08/18/2019   LDLCALC 82 05/08/2018   LDLDIRECT 101.0  08/18/2019   TRIG 224.0 (H) 08/18/2019   CHOLHDL 4 08/18/2019   A/P: Reasonably controlled on last check in again today -at age 82 I do not feel strongly about increasing dose of statin even if LDL is above newer more ideal goal of 70-likely target 100 or less-since she is at this goal at 82 no change in medicine.  Triglycerides likely slightly high due to not being nonfasting  #GERD S:Patient previously controlled with Nexium 20 mg twice a day with sparing Carafate.  Unfortunately patient had worsening symptoms and had to be advanced with GIs assistance to 40 mg twice a day back in july.  She reports she feels like stress has contributed and has done worse in last year  A/P: hopefully in long run we can get back to nexium 20 mg- for now will continue current medicine with high stress level and prior worsening of reflux leading to 40mg  BID dosing from Dr. Hilarie Fredrickson - we opted to consider reducing dose at next visit in 6 months.  She does have a history of stricture requiring dilation  #Steroid-induced hyperglycemia/sarcoidosis S: Patient remains on chronic prednisone 5 mg for sarcoidosis.  Following with pulmonology- saw them recently- due for breathing test in july.  Most of her A1c's have been under 6.5.  She does this through healthy eating.  Has lost 1 pound from last visit.  Last visit we encouraged her to start exercising-she reports she has not been able to do this with recent stressors- encouraged her to try to be    Lab Results  Component Value Date   HGBA1C 6.4 08/18/2019   HGBA1C 6.1 05/08/2018   HGBA1C 6.1 05/08/2017  A/P: hopefully a1c still controlled at least under 6.5- update with labs today  - gave info: https://www.[email or web address]/departments/parks-recreation/active-adults-50/classes- hope this works in [place] appears stable on long-term prednisone-continue pulmonology follow-up  Osteoporosis S: Patient takes calcium and vitamin D.  Had been on bisphosphonate in  the past but had significant worsening of reflux A/P: Patient is due for updated bone density-she agrees to this and this was ordered today.  Patient mention possibly Prolia-would also want to discuss Reclast option    Recommended follow up: Return in about 6 months (around 02/15/2020) for follow up- or sooner if needed. Future Appointments  Date Time Provider [place]  02/15/2020 11:00 AM Marin Olp, MD [facility]    Lab/Order associations: not fasting   ICD-10-CM   1. Essential hypertension  I10   2. Steroid-induced hyperglycemia  R73.9 Microalbumin / creatinine urine ratio   T38.0X5A Hemoglobin A1c  3. Hyperlipidemia, unspecified hyperlipidemia type  E78.5 CBC with Differential/Platelet    Comprehensive metabolic panel    Lipid panel  4. Age-related osteoporosis without current pathological fracture  M81.0 DG Bone Density  5. GERD with stricture  K21.9    K22.2   6. Sarcoidosis  D86.9 CBC with Differential/Platelet    Comprehensive metabolic panel    Return precautions advised.  Garret Reddish, MD

## 2019-08-18 NOTE — Patient Instructions (Addendum)
Health Maintenance Due  Topic Date Due  . PNA vac Low Risk Adult (2 of 2 - PCV13)-wants to discuss- will plan on this next visit 05/18/2011  . URINE MICROALBUMIN -today 05/09/2019   Move amlodipine to the evening time.   [email or web address]/departments/parks-recreation/active-adults-50/classes  Please stop by lab before you go If you do not have mychart- we will call you about results within 5 business days of [place] receiving them.  If you have mychart- we will send your results within 3 business days of [place] receiving them.  If abnormal or we want to clarify a result, we will call or mychart you to make sure you receive the message.  If you have questions or concerns or don't hear within 5-7 days, please send [place] a message or call us.   Schedule your bone density test at check out desk. You may also call directly to X-ray at [phone] to schedule an appointment that is convenient for you.  - located [address] across the street from [place] - in the basement - you do need an appointment for the bone density tests.

## 2019-08-28 ENCOUNTER — Other Ambulatory Visit: Payer: Self-pay | Admitting: Internal Medicine

## 2019-08-28 DIAGNOSIS — K2970 Gastritis, unspecified, without bleeding: Secondary | ICD-10-CM

## 2019-08-28 DIAGNOSIS — K2100 Gastro-esophageal reflux disease with esophagitis, without bleeding: Secondary | ICD-10-CM

## 2019-09-03 DIAGNOSIS — Z08 Encounter for follow-up examination after completed treatment for malignant neoplasm: Secondary | ICD-10-CM | POA: Diagnosis not present

## 2019-09-03 DIAGNOSIS — L578 Other skin changes due to chronic exposure to nonionizing radiation: Secondary | ICD-10-CM | POA: Diagnosis not present

## 2019-09-03 DIAGNOSIS — L821 Other seborrheic keratosis: Secondary | ICD-10-CM | POA: Diagnosis not present

## 2019-09-03 DIAGNOSIS — Z85828 Personal history of other malignant neoplasm of skin: Secondary | ICD-10-CM | POA: Diagnosis not present

## 2019-12-23 ENCOUNTER — Ambulatory Visit (INDEPENDENT_AMBULATORY_CARE_PROVIDER_SITE_OTHER)
Admission: RE | Admit: 2019-12-23 | Discharge: 2019-12-23 | Disposition: A | Discharge status: Home / Self Care | Payer: Medicare Other | Source: Ambulatory Visit | Attending: Family Medicine | Admitting: Family Medicine

## 2019-12-23 ENCOUNTER — Other Ambulatory Visit: Payer: Self-pay

## 2019-12-23 DIAGNOSIS — M810 Age-related osteoporosis without current pathological fracture: Secondary | ICD-10-CM

## 2020-01-08 ENCOUNTER — Other Ambulatory Visit: Payer: Self-pay | Admitting: Family Medicine

## 2020-01-08 DIAGNOSIS — K219 Gastro-esophageal reflux disease without esophagitis: Secondary | ICD-10-CM

## 2020-01-14 DIAGNOSIS — L309 Dermatitis, unspecified: Secondary | ICD-10-CM | POA: Diagnosis not present

## 2020-01-19 ENCOUNTER — Other Ambulatory Visit: Payer: Self-pay | Admitting: Family Medicine

## 2020-01-19 DIAGNOSIS — K219 Gastro-esophageal reflux disease without esophagitis: Secondary | ICD-10-CM

## 2020-02-10 ENCOUNTER — Telehealth: Payer: Medicare Other | Admitting: Family

## 2020-02-10 DIAGNOSIS — R399 Unspecified symptoms and signs involving the genitourinary system: Secondary | ICD-10-CM

## 2020-02-10 DIAGNOSIS — R300 Dysuria: Secondary | ICD-10-CM | POA: Diagnosis not present

## 2020-02-10 DIAGNOSIS — R109 Unspecified abdominal pain: Secondary | ICD-10-CM

## 2020-02-10 NOTE — Progress Notes (Signed)
Based on what you shared with me, I feel your condition warrants further evaluation and I recommend that you be seen for a face to face office visit.  Given your symptoms of a UTI with back pain you need to be seen face to face to rule out a more serious infection.    NOTE: If you entered your credit card information for this eVisit, you will not be charged. You may see a "hold" on your card for the $35 but that hold will drop off and you will not have a charge processed.   If you are having a true medical emergency please call 911.      For an urgent face to face visit, [facility] has five urgent care centers for your convenience:      NEW:  [facility] [place] [facility] [place] Get Driving Directions [phone] [address], [place] [address]  10 am - 6pm Monday - Friday    [place] [facility] Get Driving Directions [phone] [place], [place] [address]  10 am to 8 pm Monday-Friday  12 pm to 8 pm [facility] at [facility] Get Driving Directions [phone] [address], [place], [place] [address]  8 am to 8 pm Monday-Friday  9 am to 6 pm Saturday  11 am to 6 pm Sunday     [facility] Get Driving Directions  [phone] [address] [place], [place] [address]  8 am to 8 pm Monday-Friday  8 am to 4 pm [facility] at [place] Get Driving Directions [phone] [address] [place], [place] [address]  12 pm to 6 pm Monday-Friday      Your e-visit answers were reviewed by a board certified advanced clinical practitioner to complete your personal care plan.  Thank you for using e-Visits.

## 2020-02-15 ENCOUNTER — Encounter: Payer: Medicare Other | Admitting: Family Medicine

## 2020-02-15 ENCOUNTER — Ambulatory Visit: Payer: Medicare Other | Admitting: Family Medicine

## 2020-02-18 DIAGNOSIS — L710 Perioral dermatitis: Secondary | ICD-10-CM | POA: Diagnosis not present

## 2020-02-18 DIAGNOSIS — L309 Dermatitis, unspecified: Secondary | ICD-10-CM | POA: Diagnosis not present

## 2020-02-23 DIAGNOSIS — M545 Low back pain: Secondary | ICD-10-CM | POA: Diagnosis not present

## 2020-02-23 NOTE — Progress Notes (Signed)
Phone [phone]   Subjective:  Patient presents today for their annual physical. Chief complaint-noted.   See problem oriented charting- Review of Systems  Constitutional: Negative for chills and fever.  HENT: Negative for congestion and sore throat.        Some dryness in ears and nose   Eyes: Negative for blurred vision and double vision.  Respiratory: Positive for shortness of breath (mild). Negative for cough.   Cardiovascular: Negative for chest pain and palpitations.  Gastrointestinal: Positive for heartburn (intermittent- but only taking PPI once a day for most part).  Genitourinary: Negative for dysuria and frequency (since treatment).  Musculoskeletal: Negative for back pain, myalgias (she states normal for age) and neck pain.  Skin: Positive for rash (around mouth- working with dermatology). Negative for itching.  Neurological: Negative for dizziness and headaches.  Endo/Heme/Allergies: Negative for polydipsia. Bruises/bleeds easily (no recent worsening).  Psychiatric/Behavioral: Negative for depression, substance abuse and suicidal ideas.    The following were reviewed and entered/updated in epic: Past Medical History:  Diagnosis Date  . Adenomatous colon polyp    sessile  . Allergy    SEASONAL  . Anxiety   . Cancer [facility])    Skin cancer  . Cataract    bilateral  . Colon polyps   . Diverticulosis of colon (without mention of hemorrhage)   . DJD (degenerative joint disease)   . Dysrhythmia    pac  . Esophagitis   . Essential hypertension 06/04/2017  . GERD (gastroesophageal reflux disease)   . Hiatal hernia   . Hypercholesterolemia   . IBS (irritable bowel syndrome)   . MVP (mitral valve prolapse)   . Osteopenia   . Personal history of colonic polyps    SESSILE SERRATED ADENOMAS (X2)  . PONV (postoperative nausea and vomiting)   . Sarcoidosis   . Shortness of breath dyspnea   . Stricture and stenosis of esophagus   . Unspecified gastritis and  gastroduodenitis without mention of hemorrhage   . Urinary tract infection, site not specified   . Uveitis    Patient Active Problem List   Diagnosis Date Noted  . Steroid-induced hyperglycemia 10/18/2016    Priority: High  . Sarcoidosis 11/21/2011    Priority: High  . Essential hypertension 06/04/2017    Priority: Medium  . Hyperlipidemia 10/18/2016    Priority: Medium  . Osteoporosis 12/06/2014    Priority: Medium  . Dyspnea 08/20/2013    Priority: Medium  . GERD with stricture 10/12/2011    Priority: Medium  . Uveitis 09/21/2007    Priority: Medium  . Recurrent UTI 09/09/2007    Priority: Medium  . Anxiety state 08/06/2007    Priority: Medium  . History of colonic polyps 08/06/2007    Priority: Medium  . History of basal cell cancer 10/18/2016    Priority: Low  . Compression fx, lumbar spine ([place]) 09/24/2012    Priority: Low  . Osteoarthritis 09/21/2007    Priority: Low  . IBS (irritable bowel syndrome) 08/06/2007    Priority: Low  . LOW BACK PAIN, CHRONIC 08/06/2007    Priority: Low   Past Surgical History:  Procedure Laterality Date  . BASAL CELL CARCINOMA EXCISION    . CATARACT EXTRACTION Bilateral   . COLONOSCOPY  2012  . MASS EXCISION Left 11/02/2014   Procedure: EXCISION MASS LEFT ELBOW;  Surgeon: Daryll Brod, MD;  Location: [facility];  Service: Orthopedics;  Laterality: Left;  . POLYPECTOMY    . UPPER GASTROINTESTINAL ENDOSCOPY    .  VESICOVAGINAL FISTULA CLOSURE W/ TAH     "sling" after hystrectomy per patient    Family History  Problem Relation Age of Onset  . Melanoma Mother        per pt melanoma   . Colon cancer Sister        sees Dr Henrene Pastor   . Colon polyps Brother   . Sarcoidosis Brother   . Colon polyps Sister        Dr stark   . Colon polyps Sister   . Pneumonia Sister        died in her 6s  . Endometrial cancer Sister   . Lung cancer Brother   . Other Other        nephew with Wegener's   . Esophageal cancer Other     . Stroke Sister   . Osteoporosis Neg Hx   . Rectal cancer Neg Hx   . Stomach cancer Neg Hx     Medications- reviewed and updated Current Outpatient Medications  Medication Sig Dispense Refill  . amLODipine (NORVASC) 5 MG tablet TAKE 1 TABLET(5 MG) BY MOUTH DAILY 90 tablet 3  . B Complex Vitamins (VITAMIN-B COMPLEX PO) Take 1 capsule by mouth daily.     . Calcium Carbonate (CALTRATE 600 PO) Take 1 tablet by mouth 2 (two) times daily.      . cholecalciferol (VITAMIN D) 1000 units tablet Take 1,000 Units by mouth daily.    [name] esomeprazole (NEXIUM) 40 MG capsule TAKE ONE CAPSULE BY MOUTH TWICE DAILY BEFORE A MEAL 180 capsule 1  . Glucosamine HCl (GLUCOSAMINE PO) Liquid form - Take by mouth as directed once daily    . meclizine (ANTIVERT) 25 MG tablet Take 1/2-1 tablet by mouth every 4 hours as needed for dizziness    . Multiple Vitamins-Minerals (MULTIVITAL) tablet Take 1 tablet by mouth daily.      . Omega-3 Fatty Acids (FISH OIL) 1000 MG CAPS Take 1 capsule by mouth daily.      . ondansetron (ZOFRAN-ODT) 4 MG disintegrating tablet Take 1 tablet (4 mg total) by mouth every 8 (eight) hours as needed for nausea (with vertigo). 30 tablet 1  . predniSONE (DELTASONE) 5 MG tablet TAKE 1 TABLET BY MOUTH DAILY WITH BREAKFAST 90 tablet 3  . Probiotic Product (ALIGN PO) Take by mouth daily.    . simvastatin (ZOCOR) 20 MG tablet TAKE 1 TABLET(20 MG) BY MOUTH AT BEDTIME 90 tablet 3  . sucralfate (CARAFATE) 1 g tablet TAKE 1 TABLET(1 GRAM) BY MOUTH TWICE DAILY AS NEEDED 60 tablet 2  . metroNIDAZOLE (METROGEL) 0.75 % gel Apply topically 2 (two) times daily.     No current facility-administered medications for this visit.    Allergies-reviewed and updated Allergies  Allergen Reactions  . Tape Rash    Blisters skin and removes skin --NEEDS PAPER TAPE    Social History   Social History Narrative   Married- husband with parkinsons. Will be patient of Dr. Yong Channel   Needs time to care for husband       Retired Artist (until 2017), bookkeeping prior      Hobbies: cooking, florist work, Barrister's clerk   Objective  Objective:  BP 120/64   Pulse 61   Temp (!) 97.3 F (36.3 C) (Temporal)   Ht 5\' 8"  (1.727 m)   Wt 171 lb 9.6 oz (77.8 kg)   SpO2 95%   BMI 26.09 kg/m  Gen: NAD, resting comfortably HEENT: Mucous membranes are moist. Oropharynx normal  Neck: no thyromegaly CV: RRR no murmurs rubs or gallops Lungs: CTAB no crackles, wheeze, rhonchi Abdomen: soft/nontender/nondistended/normal bowel sounds. No rebound or guarding.  Ext: no edema Skin: warm, dry Neuro: grossly normal, moves all extremities, PERRLA   Assessment and Plan   79 y.o. female presenting for annual physical.  Health Maintenance counseling: 1. Anticipatory guidance: Patient counseled regarding regular dental exams -q6 months, eye exams - Dr. Maryclare Bean yearly,  DR. Dickinson for cataract, and DR. Manuella Ghazi retinal specialist,  avoiding smoking and second hand smoke , limiting alcohol to 1 beverage per day- none right now .   2. Risk factor reduction:  Advised patient of need for regular exercise and diet rich and fruits and vegetables to reduce risk of heart attack and stroke. Exercise- lacking lately- really enjoys classes she has done in past . discussed considering [facility] water aerobics.  Diet-reasonably healthy diet.  Weight at last physical was 170 in 2019 Wt Readings from Last 3 Encounters:  02/24/20 171 lb 9.6 oz (77.8 kg)  08/18/19 172 lb 12.8 oz (78.4 kg)  07/27/19 174 lb 14.4 oz (79.3 kg)  3. Immunizations/screenings/ancillary studies-with sarcoidosis history did opt for Prevnar 13.  Has already had COVID-19 vaccination- will send [place] a message.  Recommended flu shot in the fall.  Discussed hepatitis C screening and ordered today  Immunization History  Administered Date(s) Administered  . Influenza Split 05/23/2011, 05/23/2012, 05/04/2013, 05/31/2014  . Influenza Whole 09/11/2009, 05/08/2010  . Influenza, High  Dose Seasonal PF 05/02/2016, 05/08/2017, 04/25/2019  . Influenza-Unspecified 05/25/2015, 04/08/2018  . Pneumococcal Polysaccharide-23 05/17/2010  . Tdap 11/21/2011   4. Cervical cancer screening- she has past age based screening recommendations.  Still gets pelvic examinations with GYN 5. Breast cancer screening-  breast exam with GYN and mammogram -formally past age based screening recommendations but still receives these with GYN which is reasonable 6. Colon cancer screening - 02/19/2019 with 3 year repeat due to polyp and sister with colon cancer history. Also had endoscopy 7. Skin cancer screening-Dr. Mel[place]e Left in [place] with 35 years once a year for dermatology-advised regular sunscreen use. Denies worrisome, changing, or new skin lesions. Other than rash noted below 8. Birth control/STD check- monogamous and postmenopausal 9. Osteoporosis screening at 74- has been referred to endocrinology given elevated fracture risk and long-term prednisone.  Dr. Loanne Drilling last recommended Prolia in 2018- she may want another opinion  -Never smoker  Status of chronic or acute concerns   #rash around mouth- worse with mask. Metronidazole helping through dermatology  # UTI treated through urgent care 01/22/20- keflex 500 BID. Was called and told on right antibiotic so sounds liek culture was done. UA today still with leukocytes. We will send for urine culture. She is asymptomatic at this time.   # Steroid induced hyperglycemia/sarcoidosis S:on chronic prednisone 5mg  for sarcoidosis. Most a1cs have been below 6.5.   Follows with Dr. Loanne Drilling with pulmonary for sarcoidosis - last in January- follow up planned when she gets back from maternity A/P: Hopefully A1c remains well controlled despite ongoing prednisone  for sarcoidosis-continue close follow-up with pulmonology and prednisone  #hypertension S: medication: amlodipine 5 mg Home readings #s:  Similar to todays value BP Readings from Last 3 Encounters:   02/24/20 120/64  08/18/19 118/60  07/27/19 126/64  A/P: Stable. Continue current medications.   # GERD S: takes nexium every morning. Takes sucralfate as needed  A/P: with rare breakthrough will hold off on reducing dose but continue once a ay. Takes b12 in  regards to PPI reducing absorption  #hyperlipidemia S: Medication: simvastatin 20mg   Lab Results  Component Value Date   CHOL 161 08/18/2019   HDL 45.40 08/18/2019   LDLCALC 82 05/08/2018   LDLDIRECT 101.0 08/18/2019   TRIG 224.0 (H) 08/18/2019   CHOLHDL 4 08/18/2019   A/P: at age 29 I likely will not increase dose of medicine unless LDL is above 130.  Recommended follow up: Return in about 6 months (around 08/26/2020) for follow up- or sooner if needed.  Lab/Order associations:  fasting   ICD-10-CM   1. Preventative health care  Z00.00 CBC with Differential/Platelet    Comprehensive metabolic panel    Hemoglobin A1c    LDL cholesterol, direct    Hepatitis C antibody  2. Essential hypertension  I10 CBC with Differential/Platelet    Comprehensive metabolic panel    LDL cholesterol, direct  3. GERD with stricture  K21.9    K22.2   4. Hyperlipidemia, unspecified hyperlipidemia type  E78.5   5. Steroid-induced hyperglycemia  R73.9 Hemoglobin A1c   T38.0X5A   6. Encounter for hepatitis C screening test for low risk patient  Z11.59 Hepatitis C antibody  7. History of UTI  Z87.440 POCT Urinalysis Dipstick (Automated)    Urine Culture    No orders of the defined types were placed in this encounter.   Return precautions advised.  Garret Reddish, MD

## 2020-02-23 NOTE — Patient Instructions (Addendum)
Health Maintenance Due  Topic Date Due  . Hepatitis C Screening  Will get today  Never done  . COVID-19 Vaccine (1) will send message  Never done  . PNA vac Low Risk Adult (2 of 2 - PCV13)- will give this today due to sarcoidosis/prednisone 05/18/2011  . INFLUENZA VACCINE do not have in office yet- consider around october 02/21/2020   Please stop by lab before you go If you have mychart- we will send your results within 3 business days of [place] receiving them.  If you do not have mychart- we will call you about results within 5 business days of [place] receiving them.  *please note we are currently using [facility] which has a longer processing time than [facility] typically so labs may not come back as quickly as in the past *please also note that you will see labs on mychart as soon as they post. I will later go in and write notes on them- will say "notes from Dr. Yong Channel"   Consider reclast infusion once a year  We could also refer to Dr. Cruzita Lederer or Dr. Kelton Pillar for [facility].   Reclast/Zoledronic Acid injection (Paget's Disease, Osteoporosis) What is this medicine? ZOLEDRONIC ACID (ZOE le dron ik AS id) lowers the amount of calcium loss from bone. It is used to treat Paget's disease and osteoporosis in women. This medicine may be used for other purposes; ask your health care provider or pharmacist if you have questions. COMMON BRAND NAME(S): Reclast, Zometa What should I tell my health care provider before I take this medicine? They need to know if you have any of these conditions:  aspirin-sensitive asthma  cancer, especially if you are receiving medicines used to treat cancer  dental disease or wear dentures  infection  kidney disease  low levels of calcium in the blood  past surgery on the parathyroid gland or intestines  receiving corticosteroids like dexamethasone or prednisone  an unusual or allergic reaction to zoledronic acid, other medicines, foods, dyes, or  preservatives  pregnant or trying to get pregnant  breast-feeding How should I use this medicine? This medicine is for infusion into a vein. It is given by a health care professional in a hospital or clinic setting. Talk to your pediatrician regarding the use of this medicine in children. This medicine is not approved for use in children. Overdosage: If you think you have taken too much of this medicine contact a poison control center or emergency room at once. NOTE: This medicine is only for you. Do not share this medicine with others. What if I miss a dose? It is important not to miss your dose. Call your doctor or health care professional if you are unable to keep an appointment. What may interact with this medicine?  certain antibiotics given by injection  NSAIDs, medicines for pain and inflammation, like ibuprofen or naproxen  some diuretics like bumetanide, furosemide  teriparatide This list may not describe all possible interactions. Give your health care provider a list of all the medicines, herbs, non-prescription drugs, or dietary supplements you use. Also tell them if you smoke, drink alcohol, or use illegal drugs. Some items may interact with your medicine. What should I watch for while using this medicine? Visit your doctor or health care professional for regular checkups. It may be some time before you see the benefit from this medicine. Do not stop taking your medicine unless your doctor tells you to. Your doctor may order blood tests or other tests  to see how you are doing. Women should inform their doctor if they wish to become pregnant or think they might be pregnant. There is a potential for serious side effects to an unborn child. Talk to your health care professional or pharmacist for more information. You should make sure that you get enough calcium and vitamin D while you are taking this medicine. Discuss the foods you eat and the vitamins you take with your health  care professional. Some people who take this medicine have severe bone, joint, and/or muscle pain. This medicine may also increase your risk for jaw problems or a broken thigh bone. Tell your doctor right away if you have severe pain in your jaw, bones, joints, or muscles. Tell your doctor if you have any pain that does not go away or that gets worse. Tell your dentist and dental surgeon that you are taking this medicine. You should not have major dental surgery while on this medicine. See your dentist to have a dental exam and fix any dental problems before starting this medicine. Take good care of your teeth while on this medicine. Make sure you see your dentist for regular follow-up appointments. What side effects may I notice from receiving this medicine? Side effects that you should report to your doctor or health care professional as soon as possible:  allergic reactions like skin rash, itching or hives, swelling of the face, lips, or tongue  anxiety, confusion, or depression  breathing problems  changes in vision  eye pain  feeling faint or lightheaded, falls  jaw pain, especially after dental work  mouth sores  muscle cramps, stiffness, or weakness  redness, blistering, peeling or loosening of the skin, including inside the mouth  trouble passing urine or change in the amount of urine Side effects that usually do not require medical attention (report to your doctor or health care professional if they continue or are bothersome):  bone, joint, or muscle pain  constipation  diarrhea  fever  hair loss  irritation at site where injected  loss of appetite  nausea, vomiting  stomach upset  trouble sleeping  trouble swallowing  weak or tired This list may not describe all possible side effects. Call your doctor for medical advice about side effects. You may report side effects to FDA at 1-800-FDA-1088. Where should I keep my medicine? This drug is given in a  hospital or clinic and will not be stored at home. NOTE: This sheet is a summary. It may not cover all possible information. If you have questions about this medicine, talk to your doctor, pharmacist, or health care provider.  2020 Elsevier/Gold Standard (2013-12-05 14:19:57)

## 2020-02-24 ENCOUNTER — Ambulatory Visit (INDEPENDENT_AMBULATORY_CARE_PROVIDER_SITE_OTHER): Payer: Medicare Other | Admitting: Family Medicine

## 2020-02-24 ENCOUNTER — Encounter: Payer: Self-pay | Admitting: Family Medicine

## 2020-02-24 ENCOUNTER — Other Ambulatory Visit: Payer: Self-pay

## 2020-02-24 VITALS — BP 120/64 | HR 61 | Temp 97.3°F | Ht 68.0 in | Wt 171.6 lb

## 2020-02-24 DIAGNOSIS — Z87440 Personal history of urinary (tract) infections: Secondary | ICD-10-CM

## 2020-02-24 DIAGNOSIS — K219 Gastro-esophageal reflux disease without esophagitis: Secondary | ICD-10-CM

## 2020-02-24 DIAGNOSIS — K222 Esophageal obstruction: Secondary | ICD-10-CM

## 2020-02-24 DIAGNOSIS — I10 Essential (primary) hypertension: Secondary | ICD-10-CM

## 2020-02-24 DIAGNOSIS — T380X5A Adverse effect of glucocorticoids and synthetic analogues, initial encounter: Secondary | ICD-10-CM

## 2020-02-24 DIAGNOSIS — Z1159 Encounter for screening for other viral diseases: Secondary | ICD-10-CM

## 2020-02-24 DIAGNOSIS — F411 Generalized anxiety disorder: Secondary | ICD-10-CM

## 2020-02-24 DIAGNOSIS — R739 Hyperglycemia, unspecified: Secondary | ICD-10-CM

## 2020-02-24 DIAGNOSIS — Z0000 Encounter for general adult medical examination without abnormal findings: Secondary | ICD-10-CM

## 2020-02-24 DIAGNOSIS — E785 Hyperlipidemia, unspecified: Secondary | ICD-10-CM | POA: Diagnosis not present

## 2020-02-24 DIAGNOSIS — R8279 Other abnormal findings on microbiological examination of urine: Secondary | ICD-10-CM | POA: Diagnosis not present

## 2020-02-24 LAB — POC URINALSYSI DIPSTICK (AUTOMATED)
Blood, UA: NEGATIVE
Glucose, UA: NEGATIVE
Ketones, UA: NEGATIVE
Nitrite, UA: NEGATIVE
Protein, UA: NEGATIVE
Spec Grav, UA: 1.02 (ref 1.010–1.025)
Urobilinogen, UA: 0.2 E.U./dL
pH, UA: 6 (ref 5.0–8.0)

## 2020-02-25 LAB — COMPREHENSIVE METABOLIC PANEL
AG Ratio: 1.4 (calc) (ref 1.0–2.5)
ALT: 13 U/L (ref 6–29)
AST: 17 U/L (ref 10–35)
Albumin: 4 g/dL (ref 3.6–5.1)
Alkaline phosphatase (APISO): 80 U/L (ref 37–153)
BUN: 13 mg/dL (ref 7–25)
CO2: 28 mmol/L (ref 20–32)
Calcium: 9.4 mg/dL (ref 8.6–10.4)
Chloride: 102 mmol/L (ref 98–110)
Creat: 0.8 mg/dL (ref 0.60–0.93)
Globulin: 2.9 g/dL (calc) (ref 1.9–3.7)
Glucose, Bld: 99 mg/dL (ref 65–99)
Potassium: 3.8 mmol/L (ref 3.5–5.3)
Sodium: 141 mmol/L (ref 135–146)
Total Bilirubin: 1.2 mg/dL (ref 0.2–1.2)
Total Protein: 6.9 g/dL (ref 6.1–8.1)

## 2020-02-25 LAB — CBC WITH DIFFERENTIAL/PLATELET
Absolute Monocytes: 461 cells/uL (ref 200–950)
Basophils Absolute: 38 cells/uL (ref 0–200)
Basophils Relative: 0.4 %
Eosinophils Absolute: 66 cells/uL (ref 15–500)
Eosinophils Relative: 0.7 %
HCT: 48.8 % — ABNORMAL HIGH (ref 35.0–45.0)
Hemoglobin: 15.9 g/dL — ABNORMAL HIGH (ref 11.7–15.5)
Lymphs Abs: 1137 cells/uL (ref 850–3900)
MCH: 30.6 pg (ref 27.0–33.0)
MCHC: 32.6 g/dL (ref 32.0–36.0)
MCV: 93.8 fL (ref 80.0–100.0)
MPV: 11 fL (ref 7.5–12.5)
Monocytes Relative: 4.9 %
Neutro Abs: 7699 cells/uL (ref 1500–7800)
Neutrophils Relative %: 81.9 %
Platelets: 256 10*3/uL (ref 140–400)
RBC: 5.2 10*6/uL — ABNORMAL HIGH (ref 3.80–5.10)
RDW: 12.4 % (ref 11.0–15.0)
Total Lymphocyte: 12.1 %
WBC: 9.4 10*3/uL (ref 3.8–10.8)

## 2020-02-25 LAB — LDL CHOLESTEROL, DIRECT: Direct LDL: 96 mg/dL (ref <100)

## 2020-02-25 LAB — HEPATITIS C ANTIBODY
Hepatitis C Ab: NONREACTIVE
SIGNAL TO CUT-OFF: 0.06 (ref <1.00)

## 2020-02-25 LAB — HEMOGLOBIN A1C
Hgb A1c MFr Bld: 6.2 % of total Hgb — ABNORMAL HIGH (ref <5.7)
Mean Plasma Glucose: 131 (calc)
eAG (mmol/L): 7.3 (calc)

## 2020-02-25 LAB — URINE CULTURE
MICRO NUMBER:: 10786887
SPECIMEN QUALITY:: ADEQUATE

## 2020-02-27 ENCOUNTER — Other Ambulatory Visit: Payer: Self-pay | Admitting: Family Medicine

## 2020-02-27 DIAGNOSIS — K219 Gastro-esophageal reflux disease without esophagitis: Secondary | ICD-10-CM

## 2020-05-13 ENCOUNTER — Other Ambulatory Visit: Payer: Self-pay | Admitting: Family Medicine

## 2020-05-13 DIAGNOSIS — K219 Gastro-esophageal reflux disease without esophagitis: Secondary | ICD-10-CM

## 2020-05-25 ENCOUNTER — Other Ambulatory Visit: Payer: Self-pay | Admitting: Family Medicine

## 2020-05-27 ENCOUNTER — Other Ambulatory Visit: Payer: Self-pay

## 2020-05-27 ENCOUNTER — Ambulatory Visit (INDEPENDENT_AMBULATORY_CARE_PROVIDER_SITE_OTHER): Payer: Medicare Other

## 2020-05-27 DIAGNOSIS — Z23 Encounter for immunization: Secondary | ICD-10-CM | POA: Diagnosis not present

## 2020-05-27 NOTE — Progress Notes (Signed)
Patient came into the office today to receive her Prevnar 13 vaccine. She tolerated injection well. It was given in her left arm. No questions or concerns at this time.

## 2020-05-27 NOTE — Progress Notes (Signed)
I have reviewed and agree with note, evaluation, plan.   Samarth Ogle, MD  

## 2020-06-27 DIAGNOSIS — H524 Presbyopia: Secondary | ICD-10-CM | POA: Diagnosis not present

## 2020-06-27 DIAGNOSIS — H52203 Unspecified astigmatism, bilateral: Secondary | ICD-10-CM | POA: Diagnosis not present

## 2020-07-28 ENCOUNTER — Telehealth (INDEPENDENT_AMBULATORY_CARE_PROVIDER_SITE_OTHER): Payer: Medicare Other | Admitting: Family Medicine

## 2020-07-28 DIAGNOSIS — R519 Headache, unspecified: Secondary | ICD-10-CM | POA: Diagnosis not present

## 2020-07-28 DIAGNOSIS — R0981 Nasal congestion: Secondary | ICD-10-CM | POA: Diagnosis not present

## 2020-07-28 MED ORDER — AMOXICILLIN-POT CLAVULANATE 875-125 MG PO TABS: 1.0000 | ORAL_TABLET | Freq: Two times a day (BID) | ORAL | 0 refills | Status: DC

## 2020-07-28 NOTE — Patient Instructions (Signed)
-  I sent the medication(s) we discussed to your pharmacy: Meds ordered this encounter  Medications  . amoxicillin-clavulanate (AUGMENTIN) 875-125 MG tablet    Sig: Take 1 tablet by mouth 2 (two) times daily.    Dispense:  20 tablet    Refill:  0     I hope you are feeling better soon!  Seek in person care promptly if your symptoms worsen, new concerns arise or you are not improving with treatment.  It was nice to meet you today. I help [facility] out with telemedicine visits on Tuesdays and Thursdays and am available for visits on those days. If you have any concerns or questions following this visit please schedule a follow up visit with your Primary Care doctor or seek care at a local urgent care clinic to avoid delays in care.   

## 2020-07-28 NOTE — Progress Notes (Signed)
Virtual Visit via Video Note  I connected with Laura [name]  on 07/28/20 at  6:00 PM EST by a video enabled telemedicine application and verified that I am speaking with the correct person using two identifiers.  Location patient: home, [place] Location provider:work or home office Persons participating in the virtual visit: patient, provider, daughter  I discussed the limitations of evaluation and management by telemedicine and the availability of in person appointments. The patient expressed understanding and agreed to proceed.   HPI:  Acute telemedicine visit for cough and congestion: -Onset:about 8 days ago -Symptoms include:sore throat, nasal congestion, pnd, headaches at times, cough, low grade temp 99.8 a few times, felt tire, maybe improving some, but the mucus from the L nose had some blood streaks in it and now is getting some facial sinus pain on the L side -several family members sick with a cold, all tested negative for covid -no nose bleed -Denies: CP, SOB, NVD, fever, inability to get out of bed/eat/drink -Pertinent past medical history: -Pertinent medication allergies:nkda -COVID-19 vaccine status: fully vaccinated and had booster and had flu shot  ROS: See pertinent positives and negatives per HPI.  Past Medical History:  Diagnosis Date  . Adenomatous colon polyp    sessile  . Allergy    SEASONAL  . Anxiety   . Cancer [facility])    Skin cancer  . Cataract    bilateral  . Colon polyps   . Diverticulosis of colon (without mention of hemorrhage)   . DJD (degenerative joint disease)   . Dysrhythmia    pac  . Esophagitis   . Essential hypertension 06/04/2017  . GERD (gastroesophageal reflux disease)   . Hiatal hernia   . Hypercholesterolemia   . IBS (irritable bowel syndrome)   . MVP (mitral valve prolapse)   . Osteopenia   . Personal history of colonic polyps    SESSILE SERRATED ADENOMAS (X2)  . PONV (postoperative nausea and vomiting)   . Sarcoidosis   . Shortness of  breath dyspnea   . Stricture and stenosis of esophagus   . Unspecified gastritis and gastroduodenitis without mention of hemorrhage   . Urinary tract infection, site not specified   . Uveitis     Past Surgical History:  Procedure Laterality Date  . BASAL CELL CARCINOMA EXCISION    . CATARACT EXTRACTION Bilateral   . COLONOSCOPY  2012  . MASS EXCISION Left 11/02/2014   Procedure: EXCISION MASS LEFT ELBOW;  Surgeon: Cindee Salt, MD;  Location: [facility];  Service: Orthopedics;  Laterality: Left;  . POLYPECTOMY    . UPPER GASTROINTESTINAL ENDOSCOPY    . VESICOVAGINAL FISTULA CLOSURE W/ TAH     "sling" after hystrectomy per patient     Current Outpatient Medications:  .  amoxicillin-clavulanate (AUGMENTIN) 875-125 MG tablet, Take 1 tablet by mouth 2 (two) times daily., Disp: 20 tablet, Rfl: 0 .  amLODipine (NORVASC) 5 MG tablet, TAKE 1 TABLET(5 MG) BY MOUTH DAILY, Disp: 90 tablet, Rfl: 3 .  B Complex Vitamins (VITAMIN-B COMPLEX PO), Take 1 capsule by mouth daily. , Disp: , Rfl:  .  Calcium Carbonate (CALTRATE 600 PO), Take 1 tablet by mouth 2 (two) times daily.  , Disp: , Rfl:  .  cholecalciferol (VITAMIN D) 1000 units tablet, Take 1,000 Units by mouth daily., Disp: , Rfl:  .  esomeprazole (NEXIUM) 40 MG capsule, TAKE ONE CAPSULE BY MOUTH TWICE DAILY BEFORE A MEAL, Disp: 180 capsule, Rfl: 1 .  Glucosamine HCl (GLUCOSAMINE  PO), Liquid form - Take by mouth as directed once daily, Disp: , Rfl:  .  meclizine (ANTIVERT) 25 MG tablet, Take 1/2-1 tablet by mouth every 4 hours as needed for dizziness, Disp: , Rfl:  .  metroNIDAZOLE (METROGEL) 0.75 % gel, Apply topically 2 (two) times daily., Disp: , Rfl:  .  Multiple Vitamins-Minerals (MULTIVITAL) tablet, Take 1 tablet by mouth daily.  , Disp: , Rfl:  .  Omega-3 Fatty Acids (FISH OIL) 1000 MG CAPS, Take 1 capsule by mouth daily.  , Disp: , Rfl:  .  ondansetron (ZOFRAN-ODT) 4 MG disintegrating tablet, Take 1 tablet (4 mg total) by  mouth every 8 (eight) hours as needed for nausea (with vertigo)., Disp: 30 tablet, Rfl: 1 .  predniSONE (DELTASONE) 5 MG tablet, TAKE 1 TABLET BY MOUTH DAILY WITH BREAKFAST, Disp: 90 tablet, Rfl: 3 .  Probiotic Product (ALIGN PO), Take by mouth daily., Disp: , Rfl:  .  simvastatin (ZOCOR) 20 MG tablet, TAKE 1 TABLET(20 MG) BY MOUTH AT BEDTIME, Disp: 90 tablet, Rfl: 3 .  sucralfate (CARAFATE) 1 g tablet, TAKE 1 TABLET(1 GRAM) BY MOUTH TWICE DAILY AS NEEDED, Disp: 60 tablet, Rfl: 1  EXAM:  VITALS per patient if applicable:  GENERAL: alert, oriented, appears well and in no acute distress  HEENT: atraumatic, conjunttiva clear, no obvious abnormalities on inspection of external nose and ears  NECK: normal movements of the head and neck  LUNGS: on inspection no signs of respiratory distress, breathing rate appears normal, no obvious gross SOB, gasping or wheezing  CV: no obvious cyanosis  MS: moves all visible extremities without noticeable abnormality  PSYCH/NEURO: pleasant and cooperative, no obvious depression or anxiety, speech and thought processing grossly intact  ASSESSMENT AND PLAN:  Discussed the following assessment and plan:  Sinus congestion  Facial discomfort  -we discussed possible serious and likely etiologies, options for evaluation and workup, limitations of telemedicine visit vs in person visit, treatment, treatment risks and precautions. Pt prefers to treat via telemedicine empirically rather than in person at this moment.  Discussed possible viral illness versus developing sinusitis versus other.  She opted for trial of nasal saline and humidifier, also Augmentin 875 twice daily for 7 to 10 days if any worsening or if not improving over the next several days in case she has developed a bacterial sinusitis.  She has not had Covid testing, however multiple family members have all tested negative and have had similar symptoms.  She is fully vaccinated and had her  booster. . Advised to seek prompt in person care if worsening, new symptoms arise, or if is not improving with treatment. Discussed options for inperson care if PCP office not available. Did let this patient know that I only do telemedicine on Tuesdays and Thursdays for [place]. Advised to schedule follow up visit with PCP or [facility] if any further questions or concerns to avoid delays in care.   I discussed the assessment and treatment plan with the patient. The patient was provided an opportunity to ask questions and all were answered. The patient agreed with the plan and demonstrated an understanding of the instructions.     Lucretia Kern, DO  Other kidney yearly I am okay a lowest iron

## 2020-08-01 ENCOUNTER — Ambulatory Visit (INDEPENDENT_AMBULATORY_CARE_PROVIDER_SITE_OTHER): Payer: Medicare Other | Admitting: Pulmonary Disease

## 2020-08-01 ENCOUNTER — Other Ambulatory Visit: Payer: Self-pay

## 2020-08-01 ENCOUNTER — Encounter: Payer: Self-pay | Admitting: Pulmonary Disease

## 2020-08-01 VITALS — BP 122/74 | HR 82 | Temp 97.0°F | Ht 68.0 in | Wt 165.2 lb

## 2020-08-01 DIAGNOSIS — D869 Sarcoidosis, unspecified: Secondary | ICD-10-CM | POA: Diagnosis not present

## 2020-08-01 DIAGNOSIS — R002 Palpitations: Secondary | ICD-10-CM | POA: Diagnosis not present

## 2020-08-01 NOTE — Patient Instructions (Addendum)
Pulmonary sarcoidosis with uveitis - Plan for CXR next visit - Continue prednisone 5 mg daily - Will arrange for annual PFTs at next visit  Last PFTs 12/2015. Mild obstructive defect with mildly reduced DLCO - Recommend annual ophthalmology exam.  Scheduled for 09/2020 with Dr. Manuella Ghazi at [facility] - EKG today   Follow-up in 6 months with me. Will need PFT prior to visit.

## 2020-08-01 NOTE — Progress Notes (Signed)
Subjective:   PATIENT ID: Laura [name] GENDER: female DOB: 08/16/1940, MRN: [id number]   HPI  Chief Complaint  Patient presents with  . Follow-up    65yr f/u for sarcoidosis. States her breathing has been stable since last visit. Has been under a lot of stress due to multiple family member deaths. Cancelled PFTs today due to not feeling well for test.   [name] is a 80 year old female never smoker with sarcoidosis who presents for follow-up.  She was initially diagnosed with Dr. Lou Miner in 2017 and started on a prednisone taper at that time.  Since joining our clinic she has been followed by Dr. Lenna Gilford.  She is recently recovering from a viral illness, negative for covid. She has recent stressors including two siblings passing in 2021. She has palpitations and shortness of breath that she feels is emotionally related.  Otherwise her dyspnea as been stable. Has been limiting time at home to care for her husband with Parkinsons. Denies fevers, chills, cough and wheezing.  I have personally reviewed patient's past medical/family/social history/allergies/current medications.  Outpatient Medications Prior to Visit  Medication Sig Dispense Refill  . amLODipine (NORVASC) 5 MG tablet TAKE 1 TABLET(5 MG) BY MOUTH DAILY 90 tablet 3  . B Complex Vitamins (VITAMIN-B COMPLEX [name]) Take 1 capsule by mouth daily.    . Calcium Carbonate (CALTRATE 600 [name]) Take 1 tablet by mouth 2 (two) times daily.    . cholecalciferol (VITAMIN D) 1000 units tablet Take 1,000 Units by mouth daily.    [name] esomeprazole (NEXIUM) 40 MG capsule TAKE ONE CAPSULE BY MOUTH TWICE DAILY BEFORE A MEAL 180 capsule 1  . Glucosamine HCl (GLUCOSAMINE [name]) Liquid form - Take by mouth as directed once daily    . meclizine (ANTIVERT) 25 MG tablet Take 1/2-1 tablet by mouth every 4 hours as needed for dizziness    . metroNIDAZOLE (METROGEL) 0.75 % gel Apply topically 2 (two) times daily.    . Multiple Vitamins-Minerals  (MULTIVITAL) tablet Take 1 tablet by mouth daily.    . Omega-3 Fatty Acids (FISH OIL) 1000 MG CAPS Take 1 capsule by mouth daily.    . ondansetron (ZOFRAN-ODT) 4 MG disintegrating tablet Take 1 tablet (4 mg total) by mouth every 8 (eight) hours as needed for nausea (with vertigo). 30 tablet 1  . predniSONE (DELTASONE) 5 MG tablet TAKE 1 TABLET BY MOUTH DAILY WITH BREAKFAST 90 tablet 3  . Probiotic Product (ALIGN [name]) Take by mouth daily.    . simvastatin (ZOCOR) 20 MG tablet TAKE 1 TABLET(20 MG) BY MOUTH AT BEDTIME 90 tablet 3  . sucralfate (CARAFATE) 1 g tablet TAKE 1 TABLET(1 GRAM) BY MOUTH TWICE DAILY AS NEEDED 60 tablet 1  . amoxicillin-clavulanate (AUGMENTIN) 875-125 MG tablet Take 1 tablet by mouth 2 (two) times daily. 20 tablet 0   No facility-administered medications prior to visit.    Review of Systems  Constitutional: Negative for chills, diaphoresis, fever, malaise/fatigue and weight loss.  HENT: Negative for congestion.   Respiratory: Positive for shortness of breath. Negative for cough, hemoptysis, sputum production and wheezing.   Cardiovascular: Negative for chest pain, palpitations and leg swelling.    Objective:   Vitals:   08/01/20 1105  BP: 122/74  Pulse: 82  Temp: (!) 97 F (36.1 C)  TempSrc: Temporal  SpO2: 97%  Weight: 165 lb 3.2 oz (74.9 kg)  Height: 5\' 8"  (1.727 m)   Physical Exam: General: Elderly-appearing, no acute distress HENT: [place],  AT Eyes: EOMI, no scleral icterus Respiratory: Clear to auscultation bilaterally.  No crackles, wheezing or rales Cardiovascular: RRR, -M/R/G, no JVD Extremities:-Edema,-tenderness Neuro: AAO x4, CNII-XII grossly intact Skin: Intact, no rashes or bruising Psych: Normal mood, normal affect  Interim Data Chest imaging: CXR 07/03/2018-bilateral hilar prominence, unchanged compared to prior CXR 01/08/19-unchanged hilar and perihilar lung opacities CXR 07/27/19 - unchanged hilar and perihilar lung  opacities  PFT: 01/06/2016- mild obstructive defect FEV1 77%. Normal TLC. Mild reduced uncorrected DLCO  Imaging, labs and test noted above have been reviewed independently by me.    Assessment & Plan:   Discussion: 80 year old female never smoker with pulmonary sarcoid with eye involvement who presents for follow-up. On chronic prednisone. Hold on taper in setting of recent respiratory illness and dyspnea on exertion. Patient aware of adverse effects of steroids and ok with holding on taper as she feels it is benefiting at this time.  Pulmonary sarcoidosis with uveitis  - Plan for CXR next visit - Continue prednisone 5 mg daily - Will arrange for annual PFTs at next visit  Last PFTs 12/2015. Mild obstructive defect with mildly reduced DLCO - Recommend annual ophthalmology exam.  Scheduled for 09/2020 with Dr. Manuella Ghazi at [facility] - EKG: Normal sinus rhythm. No conduction abnormalities.  Palpitations - EKG today  I have spent a total time of 31-minutes on the day of the appointment reviewing prior documentation, coordinating care and discussing medical diagnosis and plan with the patient/family. Imaging, labs and tests included in this note have been reviewed and interpreted independently by me.  Canton Yearby Rodman Pickle, MD [facility] 08/01/2020 11:10 AM

## 2020-08-22 ENCOUNTER — Telehealth: Payer: Self-pay | Admitting: Family Medicine

## 2020-08-22 NOTE — Telephone Encounter (Signed)
Left message for patient to call back and schedule Medicare Annual Wellness Visit (AWV) either virtually OR in office.   Last AWV 10/26/16, please schedule at anytime with [facility] Advisor at [facility].  This should be a 45 minute visit.

## 2020-08-27 NOTE — Patient Instructions (Addendum)
Please stop by lab before you go If you have mychart- we will send your results within 3 business days of [place] receiving them.  If you do not have mychart- we will call you about results within 5 business days of [place] receiving them.  *please also note that you will see labs on mychart as soon as they post. I will later go in and write notes on them- will say "notes from Dr. Yong Channel"  Try 20mg  of nexium if you can find on sale- we can change to this in future if works- if not stick with 40mg  once a day.   So sorry for your losses recently  Recommended follow up: Return in about 6 months (around 02/28/2021) for physical or sooner if needed.

## 2020-08-27 NOTE — Progress Notes (Addendum)
Phone [phone] In person visit   Subjective:   Laura [name] is a 80 y.o. year old very pleasant female patient who presents for/with See problem oriented charting Chief Complaint  Patient presents with   Hyperlipidemia   This visit occurred during the SARS-CoV-2 public health emergency.  Safety protocols were in place, including screening questions prior to the visit, additional usage of staff PPE, and extensive cleaning of exam room while observing appropriate contact time as indicated for disinfecting solutions.   Past Medical History-  Patient Active Problem List   Diagnosis Date Noted   Steroid-induced hyperglycemia 10/18/2016    Priority: High   Sarcoidosis 11/21/2011    Priority: High   Essential hypertension 06/04/2017    Priority: Medium   Hyperlipidemia 10/18/2016    Priority: Medium   Osteoporosis 12/06/2014    Priority: Medium   Dyspnea 08/20/2013    Priority: Medium   GERD with stricture 10/12/2011    Priority: Medium   Uveitis 09/21/2007    Priority: Medium   Recurrent UTI 09/09/2007    Priority: Medium   Anxiety state 08/06/2007    Priority: Medium   History of colonic polyps 08/06/2007    Priority: Medium   History of basal cell cancer 10/18/2016    Priority: Low   Compression fx, lumbar spine ([place]) 09/24/2012    Priority: Low   Osteoarthritis 09/21/2007    Priority: Low   IBS (irritable bowel syndrome) 08/06/2007    Priority: Low   LOW BACK PAIN, CHRONIC 08/06/2007    Priority: Low   Posterior capsular opacification non visually significant of left eye 07/01/2018   Posterior capsular opacification of right eye, obscuring vision 07/01/2018   Conjunctivochalasis, bilateral 05/27/2018   Vitreous syneresis, bilateral 05/27/2018   Lamellar macular hole of right eye 02/19/2018   Female bladder prolapse 03/22/2015   Myopia with astigmatism and presbyopia, bilateral 09/07/2014   Intermediate uveitis 07/03/2012    Steroid responders 07/03/2012   Conjunctivochalasis 05/17/2011   Dermatochalasis 05/17/2011   Epiretinal membrane (ERM) of both eyes 05/17/2011   Presence of intraocular lens 05/17/2011   PVD (posterior vitreous detachment), both eyes 05/17/2011    Medications- reviewed and updated Current Outpatient Medications  Medication Sig Dispense Refill   amLODipine (NORVASC) 5 MG tablet TAKE 1 TABLET(5 MG) BY MOUTH DAILY 90 tablet 3   B Complex Vitamins (VITAMIN-B COMPLEX PO) Take 1 capsule by mouth daily.     Calcium Carbonate (CALTRATE 600 PO) Take 1 tablet by mouth 2 (two) times daily.     cholecalciferol (VITAMIN D) 1000 units tablet Take 1,000 Units by mouth daily.     esomeprazole (NEXIUM) 40 MG capsule TAKE ONE CAPSULE BY MOUTH TWICE DAILY BEFORE A MEAL (Patient taking differently: 40 mg daily.) 180 capsule 1   Glucosamine HCl (GLUCOSAMINE PO) Liquid form - Take by mouth as directed once daily     meclizine (ANTIVERT) 25 MG tablet Take 1/2-1 tablet by mouth every 4 hours as needed for dizziness     metroNIDAZOLE (METROGEL) 0.75 % gel Apply topically 2 (two) times daily.     Multiple Vitamins-Minerals (MULTIVITAL) tablet Take 1 tablet by mouth daily.     Omega-3 Fatty Acids (FISH OIL) 1000 MG CAPS Take 1 capsule by mouth daily.     ondansetron (ZOFRAN-ODT) 4 MG disintegrating tablet Take 1 tablet (4 mg total) by mouth every 8 (eight) hours as needed for nausea (with vertigo). 30 tablet 1   predniSONE (DELTASONE) 5 MG tablet TAKE 1  TABLET BY MOUTH DAILY WITH BREAKFAST 90 tablet 3   Probiotic Product (ALIGN PO) Take by mouth daily.     simvastatin (ZOCOR) 20 MG tablet TAKE 1 TABLET(20 MG) BY MOUTH AT BEDTIME 90 tablet 3   sucralfate (CARAFATE) 1 g tablet TAKE 1 TABLET(1 GRAM) BY MOUTH TWICE DAILY AS NEEDED 60 tablet 1   tacrolimus (PROTOPIC) 0.1 % ointment Apply topically 2 (two) times daily.     No current facility-administered medications for this visit.      Objective:  BP 132/68    Pulse 71    Temp 98 F (36.7 C) (Temporal)    Ht 5\' 8"  (1.727 m)    Wt 167 lb (75.8 kg)    SpO2 95%    BMI 25.39 kg/m  Gen: NAD, resting comfortably CV: RRR no murmurs rubs or gallops Lungs: CTAB no crackles, wheeze, rhonchi Abdomen: soft/nontender/nondistended/normal bowel sounds.  Ext: no edema Skin: warm, dry     Assessment and Plan   #social update- lost sister and brother to CHF within last year- lone surviving sibling  #Rash around mouth-worse with mask-was improving on metronidazole last visit to dermatology  #Steroid-induced hyperglycemia/sarcoidosis S:  Medication: Remains on chronic prednisone 5 mg for sarcoidosis-most A1c's have been under 6.5   She follows with pulmonary Dr. Lissa Merlin (back form maternity) for sarcoidosis.  Reports stable symptoms recently Exercise and diet- not exercising at moment- we discussed restarting Lab Results  Component Value Date   HGBA1C 6.2 (H) 02/24/2020   HGBA1C 6.4 08/18/2019   HGBA1C 6.1 05/08/2018    A/P: Steroid-induced hyperglycemia-update A1c with labs today  For sarcoidosis-continue close pulmonology follow-up and prednisone   #hypertension S: medication: Amlodipine 5 mg Home readings #s: 130s or 120s/60s or 70s BP Readings from Last 3 Encounters:  08/31/20 132/68  08/01/20 122/74  02/24/20 120/64  A/P: Stable on repeat. Continue current medications.    #hyperlipidemia S: Medication: Simvastatin 20Mg , omega 3 1000Mg  Lab Results  Component Value Date   CHOL 161 08/18/2019   HDL 45.40 08/18/2019   LDLCALC 82 05/08/2018   LDLDIRECT 96 02/24/2020   TRIG 224.0 (H) 08/18/2019   CHOLHDL 4 08/18/2019   A/P: Reasonable control last check-update lipids again today  # GERD S:Medication: Nexium 40Mg  every morning only.  Takes Carafate as needed- not needing lately. Takes a b complex B12 levels related to PPI use: Lab Results  Component Value Date   VITAMINB12 1,039 (H) 05/08/2018  A/P: doing  well- she is going to try a 20 mg over the counter from [facility] once on sale- if works we can try that long term if not- go back to 40mg    Recommended follow up: Return in about 6 months (around 02/28/2021) for physical or sooner if needed.  Lab/Order associations: NOT fasting- yogurt blueberries, piece of whole wheat toast and almond butter   ICD-10-CM   1. Essential hypertension  I10   2. GERD with stricture  K21.9    K22.2   3. Hyperlipidemia, unspecified hyperlipidemia type  E78.5 CBC with Differential/Platelet    Comprehensive metabolic panel    Lipid panel  4. Steroid-induced hyperglycemia  R73.9 Hemoglobin A1c   T38.0X5A     No orders of the defined types were placed in this encounter.   Return precautions advised.  Garret Reddish, MD

## 2020-08-29 ENCOUNTER — Ambulatory Visit: Payer: Medicare Other | Admitting: Family Medicine

## 2020-08-31 ENCOUNTER — Encounter: Payer: Self-pay | Admitting: Family Medicine

## 2020-08-31 ENCOUNTER — Ambulatory Visit (INDEPENDENT_AMBULATORY_CARE_PROVIDER_SITE_OTHER): Payer: Medicare Other | Admitting: Family Medicine

## 2020-08-31 ENCOUNTER — Other Ambulatory Visit: Payer: Self-pay

## 2020-08-31 VITALS — BP 132/68 | HR 71 | Temp 98.0°F | Ht 68.0 in | Wt 167.0 lb

## 2020-08-31 DIAGNOSIS — R739 Hyperglycemia, unspecified: Secondary | ICD-10-CM | POA: Diagnosis not present

## 2020-08-31 DIAGNOSIS — K222 Esophageal obstruction: Secondary | ICD-10-CM | POA: Diagnosis not present

## 2020-08-31 DIAGNOSIS — T380X5A Adverse effect of glucocorticoids and synthetic analogues, initial encounter: Secondary | ICD-10-CM

## 2020-08-31 DIAGNOSIS — E785 Hyperlipidemia, unspecified: Secondary | ICD-10-CM

## 2020-08-31 DIAGNOSIS — K219 Gastro-esophageal reflux disease without esophagitis: Secondary | ICD-10-CM | POA: Diagnosis not present

## 2020-08-31 DIAGNOSIS — F411 Generalized anxiety disorder: Secondary | ICD-10-CM

## 2020-08-31 DIAGNOSIS — I10 Essential (primary) hypertension: Secondary | ICD-10-CM | POA: Diagnosis not present

## 2020-08-31 LAB — LIPID PANEL
Cholesterol: 176 mg/dL (ref 0–200)
HDL: 47.5 mg/dL (ref >39.00)
NonHDL: 128.72
Total CHOL/HDL Ratio: 4
Triglycerides: 237 mg/dL — ABNORMAL HIGH (ref 0.0–149.0)
VLDL: 47.4 mg/dL — ABNORMAL HIGH (ref 0.0–40.0)

## 2020-08-31 LAB — COMPREHENSIVE METABOLIC PANEL
ALT: 13 U/L (ref 0–35)
AST: 18 U/L (ref 0–37)
Albumin: 4.1 g/dL (ref 3.5–5.2)
Alkaline Phosphatase: 71 U/L (ref 39–117)
BUN: 17 mg/dL (ref 6–23)
CO2: 30 mEq/L (ref 19–32)
Calcium: 9.4 mg/dL (ref 8.4–10.5)
Chloride: 103 mEq/L (ref 96–112)
Creatinine, Ser: 0.76 mg/dL (ref 0.40–1.20)
GFR: 74.18 mL/min (ref >60.00)
Glucose, Bld: 102 mg/dL — ABNORMAL HIGH (ref 70–99)
Potassium: 3.6 mEq/L (ref 3.5–5.1)
Sodium: 140 mEq/L (ref 135–145)
Total Bilirubin: 1 mg/dL (ref 0.2–1.2)
Total Protein: 6.9 g/dL (ref 6.0–8.3)

## 2020-08-31 LAB — CBC WITH DIFFERENTIAL/PLATELET
Basophils Absolute: 0.1 10*3/uL (ref 0.0–0.1)
Basophils Relative: 0.6 % (ref 0.0–3.0)
Eosinophils Absolute: 0.1 10*3/uL (ref 0.0–0.7)
Eosinophils Relative: 1.1 % (ref 0.0–5.0)
HCT: 44.6 % (ref 36.0–46.0)
Hemoglobin: 15.2 g/dL — ABNORMAL HIGH (ref 12.0–15.0)
Lymphocytes Relative: 13.6 % (ref 12.0–46.0)
Lymphs Abs: 1.3 10*3/uL (ref 0.7–4.0)
MCHC: 34.1 g/dL (ref 30.0–36.0)
MCV: 92.6 fl (ref 78.0–100.0)
Monocytes Absolute: 0.8 10*3/uL (ref 0.1–1.0)
Monocytes Relative: 8.1 % (ref 3.0–12.0)
Neutro Abs: 7.5 10*3/uL (ref 1.4–7.7)
Neutrophils Relative %: 76.6 % (ref 43.0–77.0)
Platelets: 257 10*3/uL (ref 150.0–400.0)
RBC: 4.82 Mil/uL (ref 3.87–5.11)
RDW: 13.9 % (ref 11.5–15.5)
WBC: 9.7 10*3/uL (ref 4.0–10.5)

## 2020-08-31 LAB — HEMOGLOBIN A1C: Hgb A1c MFr Bld: 6.3 % (ref 4.6–6.5)

## 2020-08-31 LAB — LDL CHOLESTEROL, DIRECT: Direct LDL: 113 mg/dL

## 2020-09-04 ENCOUNTER — Telehealth: Payer: Medicare Other | Admitting: Physician Assistant

## 2020-09-04 DIAGNOSIS — R300 Dysuria: Secondary | ICD-10-CM | POA: Diagnosis not present

## 2020-09-04 MED ORDER — CEPHALEXIN 500 MG PO CAPS: 500.0000 mg | ORAL_CAPSULE | Freq: Two times a day (BID) | ORAL | 0 refills | Status: AC

## 2020-09-04 NOTE — Progress Notes (Signed)
I have spent 5 minutes in review of e-visit questionnaire, review and updating patient chart, medical decision making and response to patient.   Laurna Shetley Cody Krissa Utke, PA-C    

## 2020-09-04 NOTE — Progress Notes (Signed)

## 2020-09-05 ENCOUNTER — Encounter: Payer: Self-pay | Admitting: Physician Assistant

## 2020-09-05 ENCOUNTER — Telehealth: Payer: Self-pay

## 2020-09-05 ENCOUNTER — Telehealth (INDEPENDENT_AMBULATORY_CARE_PROVIDER_SITE_OTHER): Payer: Medicare Other | Admitting: Physician Assistant

## 2020-09-05 VITALS — Temp 98.8°F | Ht 68.0 in | Wt 167.0 lb

## 2020-09-05 DIAGNOSIS — R399 Unspecified symptoms and signs involving the genitourinary system: Secondary | ICD-10-CM | POA: Diagnosis not present

## 2020-09-05 DIAGNOSIS — U071 COVID-19: Secondary | ICD-10-CM

## 2020-09-05 MED ORDER — PREDNISONE 20 MG PO TABS: 20.0000 mg | ORAL_TABLET | Freq: Every day | ORAL | 0 refills | Status: DC

## 2020-09-05 NOTE — Telephone Encounter (Signed)
Please tell her sorry to hear this. I looked over Laura [name]'s note and looks like she is taking great care of her. If she needs follow up please schedule with me

## 2020-09-05 NOTE — Telephone Encounter (Signed)
Patient is scheduled with Aldona Bar, but did want to notify Dr.Hunter that she is COVID positive.

## 2020-09-05 NOTE — Telephone Encounter (Signed)
FYI

## 2020-09-05 NOTE — Telephone Encounter (Signed)
Pt wanted to let Dr. Yong Channel know she tested positive for Covid this morning. She is requesting a callback about next steps.

## 2020-09-05 NOTE — Progress Notes (Signed)
Virtual Visit via Video   I connected with Laura [name] on 09/05/20 at 11:30 AM EST by a video enabled telemedicine application and verified that I am speaking with the correct person using two identifiers. Location patient: Home Location provider: [facility], Office Persons participating in the virtual visit: Savannah Morford, Inda Coke PA-C, Anselmo Pickler, LPN   I discussed the limitations of evaluation and management by telemedicine and the availability of in person appointments. The patient expressed understanding and agreed to proceed.  I acted as a Education administrator for [employer], PA-C [employer], LPN  Subjective:   HPI:   COVID-19 Symptom onset: yesterday Travel/contacts: exposed from niece last Thursday she was positive Vaccination status: complete Testing results: home test today was positive  Current symptoms -- fatigue, cough, this morning coughed up some thick yellow/brown sputum, fever, headache, and scratchy throat.   Does have significant history of sarcoidosis which she takes 5 mg prednisone daily for. She does not use inhalers regularly.  Does not have chest pain but is having SOB but thinks that it is most likely secondary to wearing an N-95 constantly -- she is wearing this around her husband to prevent COVID-19.  She has received 3 COVID vaccinations.   UTI symptoms Pt had an e-visit yesterday and was started on Keflex 500 mg twice a day and started medication last night for UTI. Pt was having some pressure and burning with urination that started last Thursday and symptoms are doing better. Has fever from COVID per her report. Denies hematuria, unusual back pain, vomiting, nausea.  ROS: See pertinent positives and negatives per HPI.  Patient Active Problem List   Diagnosis Date Noted  . Posterior capsular opacification non visually significant of left eye 07/01/2018  . Posterior capsular opacification of right eye, obscuring vision  07/01/2018  . Conjunctivochalasis, bilateral 05/27/2018  . Vitreous syneresis, bilateral 05/27/2018  . Lamellar macular hole of right eye 02/19/2018  . Essential hypertension 06/04/2017  . Hyperlipidemia 10/18/2016  . History of basal cell cancer 10/18/2016  . Steroid-induced hyperglycemia 10/18/2016  . Female bladder prolapse 03/22/2015  . Osteoporosis 12/06/2014  . Myopia with astigmatism and presbyopia, bilateral 09/07/2014  . Dyspnea 08/20/2013  . Compression fx, lumbar spine ([place]) 09/24/2012  . Intermediate uveitis 07/03/2012  . Steroid responders 07/03/2012  . Sarcoidosis 11/21/2011  . GERD with stricture 10/12/2011  . Conjunctivochalasis 05/17/2011  . Dermatochalasis 05/17/2011  . Epiretinal membrane (ERM) of both eyes 05/17/2011  . Presence of intraocular lens 05/17/2011  . PVD (posterior vitreous detachment), both eyes 05/17/2011  . Uveitis 09/21/2007  . Osteoarthritis 09/21/2007  . Recurrent UTI 09/09/2007  . Anxiety state 08/06/2007  . IBS (irritable bowel syndrome) 08/06/2007  . LOW BACK PAIN, CHRONIC 08/06/2007  . History of colonic polyps 08/06/2007    Social History   Tobacco Use  . Smoking status: Never Smoker  . Smokeless tobacco: Never Used  Substance Use Topics  . Alcohol use: Yes    Alcohol/week: 1.0 standard drink    Types: 1 Glasses of wine per week    Comment: occ wine     Current Outpatient Medications:  .  amLODipine (NORVASC) 5 MG tablet, TAKE 1 TABLET(5 MG) BY MOUTH DAILY, Disp: 90 tablet, Rfl: 3 .  B Complex Vitamins (VITAMIN-B COMPLEX PO), Take 1 capsule by mouth daily., Disp: , Rfl:  .  Calcium Carbonate (CALTRATE 600 PO), Take 1 tablet by mouth 2 (two) times daily., Disp: , Rfl:  .  cephALEXin (  KEFLEX) 500 MG capsule, Take 1 capsule (500 mg total) by mouth 2 (two) times daily for 7 days., Disp: 14 capsule, Rfl: 0 .  cholecalciferol (VITAMIN D) 1000 units tablet, Take 1,000 Units by mouth daily., Disp: , Rfl:  .  esomeprazole (NEXIUM) 40  MG capsule, TAKE ONE CAPSULE BY MOUTH TWICE DAILY BEFORE A MEAL (Patient taking differently: 40 mg daily.), Disp: 180 capsule, Rfl: 1 .  Glucosamine HCl (GLUCOSAMINE PO), Liquid form - Take by mouth as directed once daily, Disp: , Rfl:  .  meclizine (ANTIVERT) 25 MG tablet, Take 1/2-1 tablet by mouth every 4 hours as needed for dizziness, Disp: , Rfl:  .  metroNIDAZOLE (METROGEL) 0.75 % gel, Apply topically 2 (two) times daily., Disp: , Rfl:  .  Multiple Vitamins-Minerals (MULTIVITAL) tablet, Take 1 tablet by mouth daily., Disp: , Rfl:  .  Omega-3 Fatty Acids (FISH OIL) 1000 MG CAPS, Take 1 capsule by mouth daily., Disp: , Rfl:  .  ondansetron (ZOFRAN-ODT) 4 MG disintegrating tablet, Take 1 tablet (4 mg total) by mouth every 8 (eight) hours as needed for nausea (with vertigo)., Disp: 30 tablet, Rfl: 1 .  predniSONE (DELTASONE) 20 MG tablet, Take 1 tablet (20 mg total) by mouth daily with breakfast., Disp: 7 tablet, Rfl: 0 .  predniSONE (DELTASONE) 5 MG tablet, TAKE 1 TABLET BY MOUTH DAILY WITH BREAKFAST, Disp: 90 tablet, Rfl: 3 .  Probiotic Product (ALIGN PO), Take by mouth daily., Disp: , Rfl:  .  simvastatin (ZOCOR) 20 MG tablet, TAKE 1 TABLET(20 MG) BY MOUTH AT BEDTIME, Disp: 90 tablet, Rfl: 3 .  sucralfate (CARAFATE) 1 g tablet, TAKE 1 TABLET(1 GRAM) BY MOUTH TWICE DAILY AS NEEDED, Disp: 60 tablet, Rfl: 1 .  tacrolimus (PROTOPIC) 0.1 % ointment, Apply topically 2 (two) times daily., Disp: , Rfl:   Allergies  Allergen Reactions  . Tape Rash    Blisters skin and removes skin --NEEDS PAPER TAPE    Objective:   VITALS: Per patient if applicable, see vitals. GENERAL: Alert, appears well and in no acute distress. HEENT: Atraumatic, conjunctiva clear, no obvious abnormalities on inspection of external nose and ears. NECK: Normal movements of the head and neck. CARDIOPULMONARY: No increased WOB. Speaking in clear sentences. I:E ratio WNL.  MS: Moves all visible extremities without noticeable  abnormality. PSYCH: Pleasant and cooperative, well-groomed. Speech normal rate and rhythm. Affect is appropriate. Insight and judgement are appropriate. Attention is focused, linear, and appropriate.  NEURO: CN grossly intact. Oriented as arrived to appointment on time with no prompting. Moves both UE equally.  SKIN: No obvious lesions, wounds, erythema, or cyanosis noted on face or hands.  Assessment and Plan:   Laura [name] was seen today for covid positive.  Diagnoses and all orders for this visit:  COVID-19 Patient has a respiratory illness without signs of acute distress or respiratory compromise at this time.  Given multiple chronic medical issues will send for outpatient COVID treatment if she qualifies -- referral placed and discussed this process with her. Currently on 5 mg prednisone daily for her sarcoidosis. Will increase to 20 mg daily x 7 days and then resume her 5 mg daily dosage. As a precaution, they have been advised to remain home until COVID-19 results and then possible further quarantine after that based on results and symptoms. Advised if they experience a "second sickening" or worsening symptoms as the illness progresses, they are to call the office for further instructions or seek emergent evaluation for any severe  symptoms.  -     Ambulatory referral for Covid Treatment -     predniSONE (DELTASONE) 20 MG tablet; Take 1 tablet (20 mg total) by mouth daily with breakfast.  UTI symptoms Continue keflex 500 mg BID. Symptoms improving. Continue to monitor, if worsens, we will need to obtain urine specimen for culture - she understands this.   I discussed the assessment and treatment plan with the patient. The patient was provided an opportunity to ask questions and all were answered. The patient agreed with the plan and demonstrated an understanding of the instructions.   The patient was advised to call back or seek an in-person evaluation if the symptoms worsen or if the  condition fails to improve as anticipated.   CMA or LPN served as scribe during this visit. History, Physical, and Plan performed by medical provider. The above documentation has been reviewed and is accurate and complete.   [place], [place] 09/05/2020

## 2020-09-05 NOTE — Telephone Encounter (Signed)
Please scheduled virtual for pt to discuss.

## 2020-09-05 NOTE — Telephone Encounter (Signed)
Please schedule visit for pt.

## 2020-09-05 NOTE — Telephone Encounter (Signed)
Nurse Assessment Nurse: Doy Mince, RN, [place] Ginger Date/Time (Eastern Time): 09/04/2020 12:50:43 PM Confirm and document reason for call. If symptomatic, describe symptoms. ---Caller states she tested to see if she had a UTI at home and she tested 125 ++ and would like to know if she can rx sent in. She states she has burning, pressure when urinating, fatigue, and urine retention, and temperature 98.6. Does the patient have any new or worsening symptoms? ---Yes Will a triage be completed? ---Yes Related visit to physician within the last 2 weeks? ---No Does the PT have any chronic conditions? (i.e. diabetes, asthma, this includes High risk factors for pregnancy, etc.) ---No Is this a behavioral health or substance abuse call? ---No Guidelines Guideline Title Affirmed Question Affirmed Notes Nurse Date/Time Eilene Ghazi Time) Urination Pain - Female Age > 52 years Doy Mince, RN, [facility] 09/04/2020 12:52:49 PM Disp. Time Eilene Ghazi Time) Disposition Final User 09/04/2020 12:57:23 PM See PCP within 24 Hours Yes Doy Mince, RN, [place] Ginger Caller Disagree/Comply Comply PLEASE NOTE: All timestamps contained within this report are represented as [place] Standard Time. CONFIDENTIALTY NOTICE: This fax transmission is intended only for the addressee. It contains information that is legally privileged, confidential or otherwise protected from use or disclosure. If you are not the intended recipient, you are strictly prohibited from reviewing, disclosing, copying using or disseminating any of this information or taking any action in reliance on or regarding this information. If you have received this fax in error, please notify us immediately by telephone so that we can arrange for its return to [place]. Phone: [phone], Toll-Free: [phone], Fax: [phone] Page: 2 of 2 Call Id: [id number] [place] Understands Yes PreDisposition Did not know what to do Care Advice Given Per Guideline SEE PCP WITHIN 24 HOURS: * IF  OFFICE WILL BE OPEN: You need to be examined within the next 24 hours. Call your doctor (or NP/PA) when the office opens and make an appointment. REASSURANCE AND EDUCATION: * This could be an urinary tract infection. DRINK EXTRA FLUIDS: * Drink extra fluids. * Drink 8 to 10 cups (1,800 to 2,400 ml) of liquids a day. CRANBERRY JUICE: * Dosage Cranberry Juice Cocktail: 8 oz (240 ml) twice a day. CALL BACK IF: * Fever or back pain occurs * You become worse CARE ADVICE given per Urination Pain - Female (Adult) guideline. Referrals REFERRED TO PCP OFFIC

## 2020-09-06 ENCOUNTER — Telehealth (HOSPITAL_COMMUNITY): Payer: Medicare Other | Admitting: Nurse Practitioner

## 2020-09-06 ENCOUNTER — Other Ambulatory Visit (HOSPITAL_COMMUNITY): Payer: Self-pay | Admitting: Nurse Practitioner

## 2020-09-06 DIAGNOSIS — I10 Essential (primary) hypertension: Secondary | ICD-10-CM

## 2020-09-06 DIAGNOSIS — D869 Sarcoidosis, unspecified: Secondary | ICD-10-CM

## 2020-09-06 DIAGNOSIS — U071 COVID-19: Secondary | ICD-10-CM

## 2020-09-06 MED ORDER — MOLNUPIRAVIR EUA 200MG CAPSULE: 4.0000 | ORAL_CAPSULE | Freq: Two times a day (BID) | ORAL | 0 refills | Status: AC

## 2020-09-06 MED FILL — MOLNUPIRAVIR 200 MG CAPS: 200 | 5 days supply | Qty: 40 | Fill #0

## 2020-09-06 NOTE — Telephone Encounter (Signed)
Outpatient Oral COVID Treatment Note  I connected with Laura [name] on 09/06/2020/9:06 AM by telephone and verified that I am speaking with the correct person using two identifiers.  I discussed the limitations, risks, security, and privacy concerns of performing an evaluation and management service by telephone and the availability of in person appointments. I also discussed with the patient that there may be a patient responsible charge related to this service. The patient expressed understanding and agreed to proceed.  Patient location: at home Provider location: home office  Diagnosis: COVID-19 infection  Purpose of visit: Discussion of potential use of Molnupiravir or Paxlovid, a new treatment for mild to moderate COVID-19 viral infection in non-hospitalized patients.   Subjective: Patient is a 80 y.o. female who has been diagnosed with COVID 19 viral infection.  Their symptoms began on 09/03/2020 with congestion.    Past Medical History:  Diagnosis Date  . Adenomatous colon polyp    sessile  . Allergy    SEASONAL  . Anxiety   . Cancer [facility])    Skin cancer  . Cataract    bilateral  . Colon polyps   . Diverticulosis of colon (without mention of hemorrhage)   . DJD (degenerative joint disease)   . Dysrhythmia    pac  . Esophagitis   . Essential hypertension 06/04/2017  . GERD (gastroesophageal reflux disease)   . Hiatal hernia   . Hypercholesterolemia   . IBS (irritable bowel syndrome)   . MVP (mitral valve prolapse)   . Osteopenia   . Personal history of colonic polyps    SESSILE SERRATED ADENOMAS (X2)  . PONV (postoperative nausea and vomiting)   . Sarcoidosis   . Shortness of breath dyspnea   . Stricture and stenosis of esophagus   . Unspecified gastritis and gastroduodenitis without mention of hemorrhage   . Urinary tract infection, site not specified   . Uveitis     Allergies  Allergen Reactions  . Tape Rash    Blisters skin and removes skin --NEEDS  PAPER TAPE     Current Outpatient Medications:  .  amLODipine (NORVASC) 5 MG tablet, TAKE 1 TABLET(5 MG) BY MOUTH DAILY, Disp: 90 tablet, Rfl: 3 .  B Complex Vitamins (VITAMIN-B COMPLEX PO), Take 1 capsule by mouth daily., Disp: , Rfl:  .  Calcium Carbonate (CALTRATE 600 PO), Take 1 tablet by mouth 2 (two) times daily., Disp: , Rfl:  .  cephALEXin (KEFLEX) 500 MG capsule, Take 1 capsule (500 mg total) by mouth 2 (two) times daily for 7 days., Disp: 14 capsule, Rfl: 0 .  cholecalciferol (VITAMIN D) 1000 units tablet, Take 1,000 Units by mouth daily., Disp: , Rfl:  .  esomeprazole (NEXIUM) 40 MG capsule, TAKE ONE CAPSULE BY MOUTH TWICE DAILY BEFORE A MEAL (Patient taking differently: 40 mg daily.), Disp: 180 capsule, Rfl: 1 .  Glucosamine HCl (GLUCOSAMINE PO), Liquid form - Take by mouth as directed once daily, Disp: , Rfl:  .  meclizine (ANTIVERT) 25 MG tablet, Take 1/2-1 tablet by mouth every 4 hours as needed for dizziness, Disp: , Rfl:  .  metroNIDAZOLE (METROGEL) 0.75 % gel, Apply topically 2 (two) times daily., Disp: , Rfl:  .  Multiple Vitamins-Minerals (MULTIVITAL) tablet, Take 1 tablet by mouth daily., Disp: , Rfl:  .  Omega-3 Fatty Acids (FISH OIL) 1000 MG CAPS, Take 1 capsule by mouth daily., Disp: , Rfl:  .  ondansetron (ZOFRAN-ODT) 4 MG disintegrating tablet, Take 1 tablet (4 mg total) by  mouth every 8 (eight) hours as needed for nausea (with vertigo)., Disp: 30 tablet, Rfl: 1 .  predniSONE (DELTASONE) 20 MG tablet, Take 1 tablet (20 mg total) by mouth daily with breakfast., Disp: 7 tablet, Rfl: 0 .  predniSONE (DELTASONE) 5 MG tablet, TAKE 1 TABLET BY MOUTH DAILY WITH BREAKFAST, Disp: 90 tablet, Rfl: 3 .  Probiotic Product (ALIGN PO), Take by mouth daily., Disp: , Rfl:  .  simvastatin (ZOCOR) 20 MG tablet, TAKE 1 TABLET(20 MG) BY MOUTH AT BEDTIME, Disp: 90 tablet, Rfl: 3 .  sucralfate (CARAFATE) 1 g tablet, TAKE 1 TABLET(1 GRAM) BY MOUTH TWICE DAILY AS NEEDED, Disp: 60 tablet, Rfl:  1 .  tacrolimus (PROTOPIC) 0.1 % ointment, Apply topically 2 (two) times daily., Disp: , Rfl:   Objective: Patient appears/sounds congested and hoarse.  They are in no apparent distress.  Breathing is non labored.  Mood and behavior are normal.  Laboratory Data:  Recent Results (from the past 2160 hour(s))  Hemoglobin A1c     Status: None   Collection Time: 08/31/20 11:58 AM  Result Value Ref Range   Hgb A1c MFr Bld 6.3 4.6 - 6.5 %    Comment: Glycemic Control Guidelines for People with Diabetes:Non Diabetic:  <6%Goal of Therapy: <7%Additional Action Suggested:  >8%   CBC with Differential/Platelet     Status: Abnormal   Collection Time: 08/31/20 11:58 AM  Result Value Ref Range   WBC 9.7 4.0 - 10.5 K/uL   RBC 4.82 3.87 - 5.11 Mil/uL   Hemoglobin 15.2 (H) 12.0 - 15.0 g/dL   HCT 44.6 36.0 - 46.0 %   MCV 92.6 78.0 - 100.0 fl   MCHC 34.1 30.0 - 36.0 g/dL   RDW 13.9 11.5 - 15.5 %   Platelets 257.0 150.0 - 400.0 K/uL   Neutrophils Relative % 76.6 43.0 - 77.0 %   Lymphocytes Relative 13.6 12.0 - 46.0 %   Monocytes Relative 8.1 3.0 - 12.0 %   Eosinophils Relative 1.1 0.0 - 5.0 %   Basophils Relative 0.6 0.0 - 3.0 %   Neutro Abs 7.5 1.4 - 7.7 K/uL   Lymphs Abs 1.3 0.7 - 4.0 K/uL   Monocytes Absolute 0.8 0.1 - 1.0 K/uL   Eosinophils Absolute 0.1 0.0 - 0.7 K/uL   Basophils Absolute 0.1 0.0 - 0.1 K/uL  Comprehensive metabolic panel     Status: Abnormal   Collection Time: 08/31/20 11:58 AM  Result Value Ref Range   Sodium 140 135 - 145 mEq/L   Potassium 3.6 3.5 - 5.1 mEq/L   Chloride 103 96 - 112 mEq/L   CO2 30 19 - 32 mEq/L   Glucose, Bld 102 (H) 70 - 99 mg/dL   BUN 17 6 - 23 mg/dL   Creatinine, Ser 0.76 0.40 - 1.20 mg/dL   Total Bilirubin 1.0 0.2 - 1.2 mg/dL   Alkaline Phosphatase 71 39 - 117 U/L   AST 18 0 - 37 U/L   ALT 13 0 - 35 U/L   Total Protein 6.9 6.0 - 8.3 g/dL   Albumin 4.1 3.5 - 5.2 g/dL   GFR 74.18 >60.00 mL/min    Comment: Calculated using the CKD-EPI Creatinine  Equation (2021)   Calcium 9.4 8.4 - 10.5 mg/dL  Lipid panel     Status: Abnormal   Collection Time: 08/31/20 11:58 AM  Result Value Ref Range   Cholesterol 176 0 - 200 mg/dL    Comment: ATP III Classification  Desirable:  < 200 mg/dL               Borderline High:  200 - 239 mg/dL          High:  > = 240 mg/dL   Triglycerides 237.0 (H) 0.0 - 149.0 mg/dL    Comment: Normal:  <150 mg/dLBorderline High:  150 - 199 mg/dL   HDL 47.50 >39.00 mg/dL   VLDL 47.4 (H) 0.0 - 40.0 mg/dL   Total CHOL/HDL Ratio 4     Comment:                Men          Women1/2 Average Risk     3.4          3.3Average Risk          5.0          4.42X Average Risk          9.6          7.13X Average Risk          15.0          11.0                       NonHDL 128.72     Comment: NOTE:  Non-HDL goal should be 30 mg/dL higher than patient's LDL goal (i.e. LDL goal of < 70 mg/dL, would have non-HDL goal of < 100 mg/dL)  LDL cholesterol, direct     Status: None   Collection Time: 08/31/20 11:58 AM  Result Value Ref Range   Direct LDL 113.0 mg/dL    Comment: Optimal:  <100 mg/dLNear or Above Optimal:  100-129 mg/dLBorderline High:  130-159 mg/dLHigh:  160-189 mg/dLVery High:  >190 mg/dL     Assessment: 80 y.o. female with mild/moderate COVID 19 viral infection diagnosed on 09/05/2020 at high risk for progression to severe COVID 19.  Plan:  This patient is a 80 y.o. female that meets the following criteria for Emergency Use Authorization of: Molnupiravir  1. Age >18 yr 2. SARS-COV-2 positive test 3. Symptom onset < 5 days 4. Mild-to-moderate COVID disease with high risk for severe progression to hospitalization or death   I have spoken and communicated the following to the patient or parent/caregiver regarding: 1. Molnupiravir is an unapproved drug that is authorized for use under an [employer].  2. There are no adequate, approved, available products for the treatment of COVID-19 in adults who  have mild-to-moderate COVID-19 and are at high risk for progressing to severe COVID-19, including hospitalization or death. 3. Other therapeutics are currently authorized. For additional information on all products authorized for treatment or prevention of COVID-19, please see [employer].  4. There are benefits and risks of taking this treatment as outlined in the "Fact Sheet for Patients and Caregivers."  5. "Fact Sheet for Patients and Caregivers" was reviewed with patient. A hard copy will be provided to patient from pharmacy prior to the patient receiving treatment. 6. Patients should continue to self-isolate and use infection control measures (e.g., wear mask, isolate, social distance, avoid sharing personal items, clean and disinfect "high touch" surfaces, and frequent handwashing) according to CDC guidelines.  7. The patient or parent/caregiver has the option to accept or refuse treatment. 8. [place] has established a pregnancy surveillance program. 21. Females of childbearing potential should use a reliable method of contraception correctly and consistently, as applicable, for the duration  of treatment and for 4 days after the last dose of Molnupiravir. 38. Males of reproductive potential who are sexually active with females of childbearing potential should use a reliable method of contraception correctly and consistently during treatment and for at least 3 months after the last dose. 11. Pregnancy status and risk was assessed. Patient verbalized understanding of precautions.   After reviewing above information with the patient, the patient agrees to receive molnupiravir.  Follow up instructions:    . Take prescription BID x 5 days as directed . Reach out to pharmacist for counseling on medication if desired . For concerns regarding further COVID symptoms please follow  up with your PCP or urgent care . For urgent or life-threatening issues, seek care at your local emergency department  The patient was provided an opportunity to ask questions, and all were answered. The patient agreed with the plan and demonstrated an understanding of the instructions.   Script sent to [facility] and opted to pick up RX.  The patient was advised to call their PCP or seek an in-person evaluation if the symptoms worsen or if the condition fails to improve as anticipated.   I provided 35 minutes of non face-to-face telephone visit time during this encounter, and > 50% was spent counseling as documented under my assessment & plan.  Orma Render, NP 09/06/2020 Marlin Canary AM

## 2020-09-08 ENCOUNTER — Other Ambulatory Visit: Payer: Self-pay | Admitting: Family

## 2020-09-08 ENCOUNTER — Telehealth: Payer: Self-pay

## 2020-09-08 ENCOUNTER — Telehealth: Payer: Self-pay | Admitting: Family

## 2020-09-08 NOTE — Telephone Encounter (Signed)
I agree she should remain off medication.  Wear her oxygen levels okay?  Do they remain okay?  If worsening shortness of breath after coming off medicine would recommend seeking care in emergency room but I am hopeful it does not happen

## 2020-09-08 NOTE — Telephone Encounter (Signed)
Called and lm for pt tcb. 

## 2020-09-08 NOTE — Telephone Encounter (Signed)
FYI - Patient was referred to infusion center, Laura [name] from infusion center called patient and states she did not qualify for infusion but she did qualify for medication Munupirvavir She has taken 3 doses of medication, then at 1 am last night patient started having trouble breathing and swallowing called EMS for evaluation they stated she was more than likely having a reaction to medication and did not need emergency care. It has since then cleared up and she Is able to breathe and swallow. Patient states she is not going to take medication any longer and has returned call to Jordan at infusion center. She also states she was feeling dizzy yesterday afternoon but could be lack of sleep or beginning symptom of reaction to medication .

## 2020-09-08 NOTE — Telephone Encounter (Signed)
FYI

## 2020-09-08 NOTE — Telephone Encounter (Signed)
Received call from Laura [name] on the Covid Treatment Hotline about overnight events and concerns. Laura [name] was prescribed Molnupirivir for mild-moderate Covid symptoms on 2/15 with symptom onset being 2/14. She received the medication without any problems and began taking it the night of 2/15 without any problems. She proceed to continue with treatment taking it twice daily on 2/16 and went to bed at 11:30pm. She awoke at 1:00am this morning having difficulty swallowing. After a few attempts she was able to swallow and then developed shortness of breath. She used deep breathing techniques which helped to temporarily relieve her symptoms. During this time she was not having any lip/tongue swelling. After a few minutes her breathing was normal, and then became short again. She notified a family member who called EMS. EMS evaluated and noted her lungs were clear and oxygen levels were in the high 90's. They believed that her symptoms were related to anxiety/panic attack. She was not transported to the hospital and she was able to go back to sleep around 4am. She woke up this morning with no difficulty swallowing or shortness of breath after sleeping in a recliner.  We discussed that based on her description it does not sound like this was an allergic reaction to the molnipirivir, however that could not be fully ruled out. Symptoms may have been related to her congestion and also reflux which she takes medication for that ultimately resulted in anxiety/panic. Given that her symptoms are continuing to be mild with only slight congestion it was recommended that she continue with symptom management at this point and to stop taking the Molnupirivir. A MEDWATCH report was sent regarding these events. She was advised to follow up with her PCP or [facility] should her symptoms worsen or not continue to improve. She had no further questions and required no additional assistance.   Laura Piedra,  NP 09/08/2020 9:06 AM

## 2020-09-19 ENCOUNTER — Telehealth: Payer: Self-pay

## 2020-09-19 DIAGNOSIS — N3090 Cystitis, unspecified without hematuria: Secondary | ICD-10-CM | POA: Diagnosis not present

## 2020-09-19 DIAGNOSIS — R300 Dysuria: Secondary | ICD-10-CM | POA: Diagnosis not present

## 2020-09-19 NOTE — Telephone Encounter (Signed)
Patient is scheduled for a virtual at 42 tomorrow with Aldona Bar.

## 2020-09-19 NOTE — Telephone Encounter (Signed)
Laura [name] is calling in stating that she was seen two weeks ago for a possible UTI, and then she was prescribed some medication to which she stated that she thought it was helping but today took another at home UTI test, the LEU was 70+, and NIT showed to be normal. Wondering if she needs to come in for another treatment or if she needs another round of antibiotics.

## 2020-09-19 NOTE — Telephone Encounter (Signed)
Yes, please schedule her to come back for OV.

## 2020-09-20 ENCOUNTER — Telehealth: Payer: Medicare Other | Admitting: Physician Assistant

## 2020-09-20 ENCOUNTER — Telehealth: Payer: Self-pay | Admitting: *Deleted

## 2020-09-20 NOTE — Telephone Encounter (Signed)
Called pt to ask questions prior to virtual visit.  She is having frequency and pain with urination did a home test and it was positive for infection. Pt said she was just treated last month but thinks it never fully went away. Pt said she went to an Urgent care last night and was given Macrobid 100 mg BID x 5 days and they sent urine off for culture. Pt said she thought visit was cancelled. Told her that is fine I will cancel visit. Toold her if symptoms persist after treatment please let us know. Pt verbalized understanding.

## 2020-09-20 NOTE — Progress Notes (Signed)
Patient was seen at urgent care for UTI symptoms and did not need this visit. It was canceled.  She was not seen. No charge.  Inda Coke PA-C

## 2020-09-21 MED ORDER — CEPHALEXIN 500 MG PO CAPS: 500.0000 mg | ORAL_CAPSULE | Freq: Two times a day (BID) | ORAL | 0 refills | Status: DC

## 2020-09-21 NOTE — Addendum Note (Signed)
Addended by: Marian Sorrow on: 09/21/2020 10:22 AM   Modules accepted: Orders

## 2020-09-21 NOTE — Telephone Encounter (Signed)
See below, pt saw Sam yesterday but this is your pt.

## 2020-09-21 NOTE — Telephone Encounter (Signed)
Just to clarify, I did not see this patient in the office or virtually yesterday. She was seen at urgent care. Butch Penny was preparing her for a visit yesterday and she told Butch Penny she no longer needed the appointment because she was seen the evening prior.  Butch Penny please call patient and see what's going on. I'm happy to see her today if needed.

## 2020-09-21 NOTE — Telephone Encounter (Signed)
Spoke to pt asked her about her nausea? Pt said she has severe nausea after taking antibiotic Macrobid, no vomiting. Asked pt if taking wit food? Pt said yes. Asked pt if she is having any other symptoms like back pain, fever or chills, dysuria? Pt denied all symptoms but having some dysuria better since on antibiotic. Asked pt if she has ever taken Macrobid before? Pt said she does not think so. Aldona Bar said we are going to change your antibiotic to Keflex 500 mg BID x 5 days, but please let us know when culture comes back. Will send to pharmacy. Pt verbalized understanding.

## 2020-09-21 NOTE — Telephone Encounter (Signed)
Pt called stating the antibiotic she was put on for her UTI has caused her to feel nauseous. Pt asked if Butch Penny could give her a call and advise her on what to do. Please advise.

## 2020-09-22 ENCOUNTER — Telehealth: Payer: Self-pay

## 2020-09-22 DIAGNOSIS — Z9841 Cataract extraction status, right eye: Secondary | ICD-10-CM | POA: Diagnosis not present

## 2020-09-22 DIAGNOSIS — Z961 Presence of intraocular lens: Secondary | ICD-10-CM | POA: Diagnosis not present

## 2020-09-22 DIAGNOSIS — Z9842 Cataract extraction status, left eye: Secondary | ICD-10-CM | POA: Diagnosis not present

## 2020-09-22 DIAGNOSIS — H43813 Vitreous degeneration, bilateral: Secondary | ICD-10-CM | POA: Diagnosis not present

## 2020-09-22 DIAGNOSIS — Z9889 Other specified postprocedural states: Secondary | ICD-10-CM | POA: Diagnosis not present

## 2020-09-22 DIAGNOSIS — H35341 Macular cyst, hole, or pseudohole, right eye: Secondary | ICD-10-CM | POA: Diagnosis not present

## 2020-09-22 DIAGNOSIS — H30033 Focal chorioretinal inflammation, peripheral, bilateral: Secondary | ICD-10-CM | POA: Diagnosis not present

## 2020-09-22 DIAGNOSIS — D869 Sarcoidosis, unspecified: Secondary | ICD-10-CM | POA: Diagnosis not present

## 2020-09-22 DIAGNOSIS — H35372 Puckering of macula, left eye: Secondary | ICD-10-CM | POA: Diagnosis not present

## 2020-09-22 NOTE — Telephone Encounter (Signed)
Yes, SDA is fine. 

## 2020-09-22 NOTE — Telephone Encounter (Signed)
I called patient and she states she has bacteria in her urine, but could not specify what bacteria cause she had contamination in it. [place] wants to know what she should do as she is currently on antibiotics and was advised that the bacteria would not show up as she was on antibiotics. She is on her 3rd week on the urine issues, and is wondering if someone can give her a call back to clarify.

## 2020-09-22 NOTE — Telephone Encounter (Signed)
Patient calling in with results from a test of her urine from when she was seen at urgent care, Indication is contamination of the urine. They recommended a follow up with Dr.Hunter, but patient wants to know if she has to come in, can we use same day next week

## 2020-09-23 ENCOUNTER — Ambulatory Visit (INDEPENDENT_AMBULATORY_CARE_PROVIDER_SITE_OTHER): Payer: Medicare Other | Admitting: Family Medicine

## 2020-09-23 ENCOUNTER — Encounter: Payer: Self-pay | Admitting: Family Medicine

## 2020-09-23 ENCOUNTER — Other Ambulatory Visit: Payer: Self-pay

## 2020-09-23 VITALS — BP 126/74 | HR 80 | Temp 98.1°F | Ht 68.0 in | Wt 167.2 lb

## 2020-09-23 DIAGNOSIS — N3090 Cystitis, unspecified without hematuria: Secondary | ICD-10-CM | POA: Diagnosis not present

## 2020-09-23 DIAGNOSIS — R300 Dysuria: Secondary | ICD-10-CM

## 2020-09-23 LAB — POC URINALSYSI DIPSTICK (AUTOMATED)
Bilirubin, UA: NEGATIVE
Blood, UA: NEGATIVE
Glucose, UA: NEGATIVE
Ketones, UA: NEGATIVE
Leukocytes, UA: NEGATIVE
Nitrite, UA: NEGATIVE
Protein, UA: NEGATIVE
Spec Grav, UA: 1.015 (ref 1.010–1.025)
Urobilinogen, UA: 0.2 E.U./dL
pH, UA: 6 (ref 5.0–8.0)

## 2020-09-23 MED ORDER — CEPHALEXIN 500 MG PO CAPS: 500.0000 mg | ORAL_CAPSULE | Freq: Four times a day (QID) | ORAL | 0 refills | Status: DC

## 2020-09-23 NOTE — Telephone Encounter (Signed)
Pt scheduled for OV today.  

## 2020-09-23 NOTE — Patient Instructions (Addendum)
UA  Reassuring but has been on antibiotics already. Strongly still suspect UTI. Will get culture. Will increase from twice daily to 4x a day especially since had recurrence after finishing twice daily course several weeks ago. May need to change antibiotics depending on culture results  Patient to follow up if new or worsening symptoms or failure to improve.   If you have not heard by Tuesday morning about results please give [place] a call

## 2020-09-23 NOTE — Progress Notes (Signed)
Phone [phone] In person visit   Subjective:   Consepcion [name] is a 80 y.o. year old very pleasant female patient who presents for/with See problem oriented charting Chief Complaint  Patient presents with  . Urinary Frequency    Pressure feeling  . burning with urination    This visit occurred during the SARS-CoV-2 public health emergency.  Safety protocols were in place, including screening questions prior to the visit, additional usage of staff PPE, and extensive cleaning of exam room while observing appropriate contact time as indicated for disinfecting solutions.   Past Medical History-  Patient Active Problem List   Diagnosis Date Noted  . Steroid-induced hyperglycemia 10/18/2016    Priority: High  . Sarcoidosis 11/21/2011    Priority: High  . Essential hypertension 06/04/2017    Priority: Medium  . Hyperlipidemia 10/18/2016    Priority: Medium  . Osteoporosis 12/06/2014    Priority: Medium  . Dyspnea 08/20/2013    Priority: Medium  . GERD with stricture 10/12/2011    Priority: Medium  . Uveitis 09/21/2007    Priority: Medium  . Recurrent UTI 09/09/2007    Priority: Medium  . Anxiety state 08/06/2007    Priority: Medium  . History of colonic polyps 08/06/2007    Priority: Medium  . History of basal cell cancer 10/18/2016    Priority: Low  . Compression fx, lumbar spine ([place]) 09/24/2012    Priority: Low  . Osteoarthritis 09/21/2007    Priority: Low  . IBS (irritable bowel syndrome) 08/06/2007    Priority: Low  . LOW BACK PAIN, CHRONIC 08/06/2007    Priority: Low  . Posterior capsular opacification non visually significant of left eye 07/01/2018  . Posterior capsular opacification of right eye, obscuring vision 07/01/2018  . Conjunctivochalasis, bilateral 05/27/2018  . Vitreous syneresis, bilateral 05/27/2018  . Lamellar macular hole of right eye 02/19/2018  . Female bladder prolapse 03/22/2015  . Myopia with astigmatism and presbyopia, bilateral  09/07/2014  . Intermediate uveitis 07/03/2012  . Steroid responders 07/03/2012  . Conjunctivochalasis 05/17/2011  . Dermatochalasis 05/17/2011  . Epiretinal membrane (ERM) of both eyes 05/17/2011  . Presence of intraocular lens 05/17/2011  . PVD (posterior vitreous detachment), both eyes 05/17/2011    Medications- reviewed and updated Current Outpatient Medications  Medication Sig Dispense Refill  . amLODipine (NORVASC) 5 MG tablet TAKE 1 TABLET(5 MG) BY MOUTH DAILY 90 tablet 3  . B Complex Vitamins (VITAMIN-B COMPLEX PO) Take 1 capsule by mouth daily.    . Calcium Carbonate (CALTRATE 600 PO) Take 1 tablet by mouth 2 (two) times daily.    . cholecalciferol (VITAMIN D) 1000 units tablet Take 1,000 Units by mouth daily.    [name] esomeprazole (NEXIUM) 40 MG capsule TAKE ONE CAPSULE BY MOUTH TWICE DAILY BEFORE A MEAL (Patient taking differently: 20 mg daily.) 180 capsule 1  . Glucosamine HCl (GLUCOSAMINE PO) Liquid form - Take by mouth as directed once daily    . meclizine (ANTIVERT) 25 MG tablet Take 1/2-1 tablet by mouth every 4 hours as needed for dizziness    . metroNIDAZOLE (METROGEL) 0.75 % gel Apply topically 2 (two) times daily.    . Multiple Vitamins-Minerals (MULTIVITAL) tablet Take 1 tablet by mouth daily.    . Omega-3 Fatty Acids (FISH OIL) 1000 MG CAPS Take 1 capsule by mouth daily.    . ondansetron (ZOFRAN-ODT) 4 MG disintegrating tablet Take 1 tablet (4 mg total) by mouth every 8 (eight) hours as needed for nausea (with vertigo).  30 tablet 1  . predniSONE (DELTASONE) 5 MG tablet TAKE 1 TABLET BY MOUTH DAILY WITH BREAKFAST 90 tablet 3  . Probiotic Product (ALIGN PO) Take by mouth daily.    . simvastatin (ZOCOR) 20 MG tablet TAKE 1 TABLET(20 MG) BY MOUTH AT BEDTIME 90 tablet 3  . sucralfate (CARAFATE) 1 g tablet TAKE 1 TABLET(1 GRAM) BY MOUTH TWICE DAILY AS NEEDED 60 tablet 1  . tacrolimus (PROTOPIC) 0.1 % ointment Apply topically 2 (two) times daily.    . cephALEXin (KEFLEX) 500 MG  capsule 500 mg BID 28 capsule 0   No current facility-administered medications for this visit.     Objective:  BP 126/74   Pulse 80   Temp 98.1 F (36.7 C) (Temporal)   Ht 5\' 8"  (1.727 m)   Wt 167 lb 3.2 oz (75.8 kg)   SpO2 96%   BMI 25.42 kg/m  Gen: NAD, resting comfortably CV: RRR no murmurs rubs or gallops Lungs: CTAB no crackles, wheeze, rhonchi Abdomen: soft/nontender except mild suprapubic pressure/nondistended/normal bowel sounds. No rebound or guarding. No flank pain Ext: no edema Skin: warm, dry Results for orders placed or performed in visit on 09/23/20 (from the past 24 hour(s))  POCT Urinalysis Dipstick (Automated)     Status: Normal   Collection Time: 09/23/20 10:13 AM  Result Value Ref Range   Color, UA yellow    Clarity, UA clear    Glucose, UA Negative Negative   Bilirubin, UA negative    Ketones, UA negative    Spec Grav, UA 1.015 1.010 - 1.025   Blood, UA negative    pH, UA 6.0 5.0 - 8.0   Protein, UA Negative Negative   Urobilinogen, UA 0.2 0.2 or 1.0 E.U./dL   Nitrite, UA negative    Leukocytes, UA Negative Negative       Assessment and Plan   # recovered from covid 19 thankfully- back down to 5 mg prednisone now.   #Concern for UTI S: Patients symptoms started 2 weeks ago on Sunday.  On 09/04/20 did e visit with Laura Aquas, Pa and started on keflex. The next day diagnosed with covid 2.14.22. Thought UTI symptoms cleared. This past weekend again she developed some symptoms again- called office on Monday but was not able to get in here- saw [facility] in [place]. They did a UA and uculture on 09/19/20. She initially was started on nitrofurantoin 100 mg BID for 5 days on the 28th but caused nausea-  Then she called here and was started back on keflex. She called yesterday and was told there was bacteria but also contaminate and could not identify bacteria. I was able to locate through care everywhere and "MIXED BACTERIAL FLORA.   Multiple bacterial morphotypes are indicative of a contaminated specimen. Suggest appropriate recollection with timely delivery to the laboratory, if clinically indicated. "  Complains of dysuria: pressure particularly before urination- some burning in that area ; polyuria: yes; nocturia: yes; urgency: yes.  Symptoms are slightly improving but persistent .  ROS- no fever, chills (other than possible twinge), nausea (was only on nitrofurantoin), vomiting, flank pain. No blood in urine.  A/P: UA  Reassuring but has been on antibiotics already. Strongly still suspect UTI. Will get culture. Will increase from twice daily to 4x a day especially since had recurrence after finishing twice daily course several weeks ago. May need to change antibiotics depending on culture results  -since already on antibiotics with this culture (keflex twice a  day) we discussed may need to repeat culture off antibiotics if symptoms persist  Patient to follow up if new or worsening symptoms or failure to improve.   Recommended follow up: Return for as needed for new, worsening, persistent symptoms. Future Appointments  Date Time Provider [place]  03/03/2021 10:40 AM Marin Olp, MD [facility]    Lab/Order associations:   ICD-10-CM   1. Cystitis  N30.90   2. Dysuria  R30.0 Urine Culture    POCT Urinalysis Dipstick (Automated)    Meds ordered this encounter  Medications  . cephALEXin (KEFLEX) 500 MG capsule    Sig: Take 1 capsule (500 mg total) by mouth every 6 (six) hours.    Dispense:  28 capsule    Refill:  0    Time Spent: 22 minutes of total time (10:50 AM- 11:12 AM) was spent on the date of the encounter performing the following actions: chart review prior to seeing the patient, obtaining history, performing a medically necessary exam, counseling on the treatment plan, placing orders, and documenting in our EHR.   Return precautions advised.  Garret Reddish, MD

## 2020-09-24 LAB — URINE CULTURE
MICRO NUMBER:: 11608670
Result:: NO GROWTH
SPECIMEN QUALITY:: ADEQUATE

## 2020-09-27 ENCOUNTER — Other Ambulatory Visit: Payer: Self-pay | Admitting: Pulmonary Disease

## 2020-09-27 ENCOUNTER — Telehealth: Payer: Self-pay

## 2020-09-27 NOTE — Telephone Encounter (Signed)
Called and reviewed results with pt. 

## 2020-09-27 NOTE — Telephone Encounter (Signed)
Patient is wondering if we can give her a call about her urine culture.

## 2020-10-10 ENCOUNTER — Other Ambulatory Visit: Payer: Medicare Other

## 2020-10-10 ENCOUNTER — Telehealth: Payer: Self-pay

## 2020-10-10 DIAGNOSIS — R350 Frequency of micturition: Secondary | ICD-10-CM | POA: Diagnosis not present

## 2020-10-10 DIAGNOSIS — R300 Dysuria: Secondary | ICD-10-CM | POA: Diagnosis not present

## 2020-10-10 NOTE — Telephone Encounter (Signed)
Please tell her that we would like for her to call - may generally advise a visit- before dropping off urine. For instance if she had dropped off Friday there would have been no one to sign for orders  If she has frequency or pain may order UA and culture though this time.

## 2020-10-10 NOTE — Telephone Encounter (Signed)
Patient is aware of dr. Ronney Lion comments. Gave a verbal understanding. Order has been placed.

## 2020-10-10 NOTE — Telephone Encounter (Signed)
Patient came in the office today and states that you told her whenever she is having UTI symptoms that she could stop by and leave a urine sample. I have no idea what symptoms she's having. She did leave a urine sample. I am going to try and call her so that I have DX to put with the orders. Is it okay for me to place orders.

## 2020-10-11 DIAGNOSIS — L578 Other skin changes due to chronic exposure to nonionizing radiation: Secondary | ICD-10-CM | POA: Diagnosis not present

## 2020-10-11 DIAGNOSIS — L720 Epidermal cyst: Secondary | ICD-10-CM | POA: Diagnosis not present

## 2020-10-11 DIAGNOSIS — Z85828 Personal history of other malignant neoplasm of skin: Secondary | ICD-10-CM | POA: Diagnosis not present

## 2020-10-11 DIAGNOSIS — L649 Androgenic alopecia, unspecified: Secondary | ICD-10-CM | POA: Diagnosis not present

## 2020-10-11 DIAGNOSIS — D1801 Hemangioma of skin and subcutaneous tissue: Secondary | ICD-10-CM | POA: Diagnosis not present

## 2020-10-11 DIAGNOSIS — Z08 Encounter for follow-up examination after completed treatment for malignant neoplasm: Secondary | ICD-10-CM | POA: Diagnosis not present

## 2020-10-11 DIAGNOSIS — Z419 Encounter for procedure for purposes other than remedying health state, unspecified: Secondary | ICD-10-CM | POA: Diagnosis not present

## 2020-10-11 DIAGNOSIS — L821 Other seborrheic keratosis: Secondary | ICD-10-CM | POA: Diagnosis not present

## 2020-10-11 LAB — URINE CULTURE
MICRO NUMBER:: 11670878
SPECIMEN QUALITY:: ADEQUATE

## 2020-11-04 ENCOUNTER — Telehealth: Payer: Self-pay | Admitting: Family Medicine

## 2020-11-04 NOTE — Chronic Care Management (AMB) (Signed)
  Chronic Care Management   Outreach Note  11/04/2020 Name: Laura [name] MRN: [id number] DOB: 19-Oct-1940  Referred by: Marin Olp, MD Reason for referral : No chief complaint on file.   An unsuccessful telephone outreach was attempted today. The patient was referred to the pharmacist for assistance with care management and care coordination.   Follow Up Plan:   SIGNATURE

## 2020-11-10 ENCOUNTER — Telehealth: Payer: Self-pay

## 2020-11-10 NOTE — Telephone Encounter (Signed)
Patient is returning call,

## 2020-11-10 NOTE — Telephone Encounter (Signed)
Hi, it looks like you tried to reach pt on 11/04/20 and was unsuccessful, she is returning your call.

## 2020-11-18 ENCOUNTER — Telehealth: Payer: Self-pay

## 2020-11-18 ENCOUNTER — Ambulatory Visit (INDEPENDENT_AMBULATORY_CARE_PROVIDER_SITE_OTHER): Payer: Medicare Other | Admitting: Physician Assistant

## 2020-11-18 ENCOUNTER — Encounter: Payer: Self-pay | Admitting: Physician Assistant

## 2020-11-18 ENCOUNTER — Other Ambulatory Visit: Payer: Self-pay

## 2020-11-18 VITALS — BP 120/60 | HR 66 | Temp 97.6°F | Ht 68.0 in | Wt 167.4 lb

## 2020-11-18 DIAGNOSIS — R300 Dysuria: Secondary | ICD-10-CM | POA: Diagnosis not present

## 2020-11-18 DIAGNOSIS — N390 Urinary tract infection, site not specified: Secondary | ICD-10-CM | POA: Diagnosis not present

## 2020-11-18 LAB — POCT URINALYSIS DIPSTICK
Bilirubin, UA: NEGATIVE
Blood, UA: NEGATIVE
Glucose, UA: NEGATIVE
Ketones, UA: NEGATIVE
Leukocytes, UA: NEGATIVE
Nitrite, UA: NEGATIVE
Protein, UA: NEGATIVE
Spec Grav, UA: 1.015 (ref 1.010–1.025)
Urobilinogen, UA: 0.2 E.U./dL
pH, UA: 6 (ref 5.0–8.0)

## 2020-11-18 MED ORDER — CEPHALEXIN 500 MG PO CAPS: 500.0000 mg | ORAL_CAPSULE | Freq: Four times a day (QID) | ORAL | 0 refills | Status: DC

## 2020-11-18 NOTE — Telephone Encounter (Signed)
Patient took a UTI test and ELU 75-125  NIT was normal patient would like for [place] to send something in for her or if she needs an apt.  Please advise

## 2020-11-18 NOTE — Patient Instructions (Signed)
It was great to see you!  I'm putting in a referral for you to see the uro-gynecologist. They will be in touch with an appointment  Start --> Keflex 500 mg four times daily  General instructions  Make sure you: ? Pee until your bladder is empty. ? Do not hold pee for a long time. ? Empty your bladder after sex. ? Wipe from front to back after pooping if you are a female. Use each tissue one time when you wipe.  Drink enough fluid to keep your pee pale yellow.  Keep all follow-up visits as told by your doctor. This is important. Contact a doctor if:  You do not get better after 1-2 days.  Your symptoms go away and then come back. Get help right away if:  You have very bad back pain.  You have very bad pain in your lower belly.  You have a fever.  You are sick to your stomach (nauseous).  You are throwing up.    Take care,  Inda Coke PA-C

## 2020-11-18 NOTE — Progress Notes (Signed)
Laura [name] is a 80 y.o. female here for a new problem.  I acted as a Education administrator for [employer], PA-C [employer], LPN   History of Present Illness:   Chief Complaint  Patient presents with  . urinary symptoms    HPI   Urinary symptoms Pt c/o burning with urination, pelvic pressure, fatigue x several days. Some low back pain. Pt was just treated last month for UTI, was given keflex. Denies fever or chills.  She did a drug store UTI test that came back positive for UTI.  She has had a bladder sling and per patient has bladder prolapse that causes her to feel symptomatic.   Past Medical History:  Diagnosis Date  . Adenomatous colon polyp    sessile  . Allergy    SEASONAL  . Anxiety   . Cancer [facility])    Skin cancer  . Cataract    bilateral  . Colon polyps   . Diverticulosis of colon (without mention of hemorrhage)   . DJD (degenerative joint disease)   . Dysrhythmia    pac  . Esophagitis   . Essential hypertension 06/04/2017  . GERD (gastroesophageal reflux disease)   . Hiatal hernia   . Hypercholesterolemia   . IBS (irritable bowel syndrome)   . MVP (mitral valve prolapse)   . Osteopenia   . Personal history of colonic polyps    SESSILE SERRATED ADENOMAS (X2)  . PONV (postoperative nausea and vomiting)   . Sarcoidosis   . Shortness of breath dyspnea   . Stricture and stenosis of esophagus   . Unspecified gastritis and gastroduodenitis without mention of hemorrhage   . Urinary tract infection, site not specified   . Uveitis      Social History   Tobacco Use  . Smoking status: Never Smoker  . Smokeless tobacco: Never Used  Substance Use Topics  . Alcohol use: Yes    Alcohol/week: 1.0 standard drink    Types: 1 Glasses of wine per week    Comment: occ wine   . Drug use: No    Past Surgical History:  Procedure Laterality Date  . BASAL CELL CARCINOMA EXCISION    . CATARACT EXTRACTION Bilateral   . COLONOSCOPY  November 13, 2010  . MASS EXCISION Left  11/02/2014   Procedure: EXCISION MASS LEFT ELBOW;  Surgeon: Daryll Brod, MD;  Location: [place];  Service: Orthopedics;  Laterality: Left;  . POLYPECTOMY    . UPPER GASTROINTESTINAL ENDOSCOPY    . VESICOVAGINAL FISTULA CLOSURE W/ TAH     "sling" after hystrectomy per patient    Family History  Problem Relation Age of Onset  . Melanoma Mother        per pt melanoma   . Colon cancer Sister        sees Dr Henrene Pastor   . Congestive Heart Failure Sister        died in 11/13/2019 at age 31  . Endometrial cancer Sister   . Colon polyps Brother   . Sarcoidosis Brother   . Congestive Heart Failure Brother        died at 64  . Colon polyps Sister        Dr stark   . Stroke Sister        died in 2019/11/13 at age 24  . Colon polyps Sister   . Pneumonia Sister        died in her 80s  . Lung cancer Brother   . Other  Other        nephew with Wegener's   . Esophageal cancer Other   . Osteoporosis Neg Hx   . Rectal cancer Neg Hx   . Stomach cancer Neg Hx     Allergies  Allergen Reactions  . Nitrofurantoin Nausea Only    Significant and had to stop after 2-3 doses   . Tape Rash    Blisters skin and removes skin --NEEDS PAPER TAPE    Current Medications:   Current Outpatient Medications:  .  amLODipine (NORVASC) 5 MG tablet, TAKE 1 TABLET(5 MG) BY MOUTH DAILY, Disp: 90 tablet, Rfl: 3 .  B Complex Vitamins (VITAMIN-B COMPLEX PO), Take 1 capsule by mouth daily., Disp: , Rfl:  .  Calcium Carbonate (CALTRATE 600 PO), Take 1 tablet by mouth 2 (two) times daily., Disp: , Rfl:  .  cephALEXin (KEFLEX) 500 MG capsule, Take 1 capsule (500 mg total) by mouth 4 (four) times daily., Disp: 28 capsule, Rfl: 0 .  cholecalciferol (VITAMIN D) 1000 units tablet, Take 1,000 Units by mouth daily., Disp: , Rfl:  .  esomeprazole (NEXIUM) 40 MG capsule, TAKE ONE CAPSULE BY MOUTH TWICE DAILY BEFORE A MEAL (Patient taking differently: 20 mg daily.), Disp: 180 capsule, Rfl: 1 .  Glucosamine HCl (GLUCOSAMINE  PO), Liquid form - Take by mouth as directed once daily, Disp: , Rfl:  .  meclizine (ANTIVERT) 25 MG tablet, Take 1/2-1 tablet by mouth every 4 hours as needed for dizziness, Disp: , Rfl:  .  metroNIDAZOLE (METROGEL) 0.75 % gel, Apply topically 2 (two) times daily., Disp: , Rfl:  .  Multiple Vitamins-Minerals (MULTIVITAL) tablet, Take 1 tablet by mouth daily., Disp: , Rfl:  .  Omega-3 Fatty Acids (FISH OIL) 1000 MG CAPS, Take 1 capsule by mouth daily., Disp: , Rfl:  .  ondansetron (ZOFRAN-ODT) 4 MG disintegrating tablet, Take 1 tablet (4 mg total) by mouth every 8 (eight) hours as needed for nausea (with vertigo)., Disp: 30 tablet, Rfl: 1 .  predniSONE (DELTASONE) 5 MG tablet, TAKE 1 TABLET BY MOUTH DAILY WITH BREAKFAST, Disp: 90 tablet, Rfl: 3 .  Probiotic Product (ALIGN PO), Take by mouth daily., Disp: , Rfl:  .  simvastatin (ZOCOR) 20 MG tablet, TAKE 1 TABLET(20 MG) BY MOUTH AT BEDTIME, Disp: 90 tablet, Rfl: 3 .  sucralfate (CARAFATE) 1 g tablet, TAKE 1 TABLET(1 GRAM) BY MOUTH TWICE DAILY AS NEEDED, Disp: 60 tablet, Rfl: 1 .  tacrolimus (PROTOPIC) 0.1 % ointment, Apply topically 2 (two) times daily., Disp: , Rfl:    Review of Systems:   ROS  Negative unless otherwise specified per HPI.  Vitals:   Vitals:   11/18/20 1408  BP: 120/60  Pulse: 66  Temp: 97.6 F (36.4 C)  TempSrc: Temporal  SpO2: 97%  Weight: 167 lb 6.1 oz (75.9 kg)  Height: 5\' 8"  (1.727 m)     Body mass index is 25.45 kg/m.  Physical Exam:   Physical Exam Vitals and nursing note reviewed.  Constitutional:      General: She is not in acute distress.    Appearance: She is well-developed. She is not ill-appearing or toxic-appearing.  Cardiovascular:     Rate and Rhythm: Normal rate and regular rhythm.     Pulses: Normal pulses.     Heart sounds: Normal heart sounds, S1 normal and S2 normal.     Comments: No LE edema Pulmonary:     Effort: Pulmonary effort is normal.     Breath sounds:  Normal breath sounds.   Abdominal:     General: Abdomen is flat. Bowel sounds are normal.     Palpations: Abdomen is soft.     Tenderness: There is no abdominal tenderness.  Skin:    General: Skin is warm and dry.  Neurological:     Mental Status: She is alert.     GCS: GCS eye subscore is 4. GCS verbal subscore is 5. GCS motor subscore is 6.  Psychiatric:        Speech: Speech normal.        Behavior: Behavior normal. Behavior is cooperative.     Results for orders placed or performed in visit on 11/18/20  POCT urinalysis dipstick  Result Value Ref Range   Color, UA yellow    Clarity, UA clear    Glucose, UA Negative Negative   Bilirubin, UA negative    Ketones, UA negative    Spec Grav, UA 1.015 1.010 - 1.025   Blood, UA negative    pH, UA 6.0 5.0 - 8.0   Protein, UA Negative Negative   Urobilinogen, UA 0.2 0.2 or 1.0 E.U./dL   Nitrite, UA negative    Leukocytes, UA Negative Negative   Appearance     Odor      Assessment and Plan:   Laura [name] was seen today for urinary symptoms.  Diagnoses and all orders for this visit:  Dysuria; Recurrent UTI While UA is essentially normal, symptoms are consistent with past UTI episodes. Urine culture will be sent, adjust treatment as indicated. Start keflex 500 mg QID (reportedly required QID dosing to clear last UTI.) Due to frequency of episodes and gyn hx, will refer to urogynecology. Worsening precautions advised. -     POCT urinalysis dipstick -     Urine Culture -     Ambulatory referral to Urogynecology  Other orders -     cephALEXin (KEFLEX) 500 MG capsule; Take 1 capsule (500 mg total) by mouth 4 (four) times daily.   CMA or LPN served as scribe during this visit. History, Physical, and Plan performed by medical provider. The above documentation has been reviewed and is accurate and complete.   Inda Coke, PA-C

## 2020-11-18 NOTE — Telephone Encounter (Signed)
Patient scheduled with Aldona Bar today

## 2020-11-18 NOTE — Telephone Encounter (Signed)
Please schedule pt for an appointment

## 2020-11-19 LAB — URINE CULTURE
MICRO NUMBER:: 11831712
Result:: NO GROWTH
SPECIMEN QUALITY:: ADEQUATE

## 2020-12-11 ENCOUNTER — Other Ambulatory Visit: Payer: Self-pay | Admitting: Family Medicine

## 2020-12-11 DIAGNOSIS — K219 Gastro-esophageal reflux disease without esophagitis: Secondary | ICD-10-CM

## 2020-12-12 ENCOUNTER — Other Ambulatory Visit: Payer: Self-pay | Admitting: Family Medicine

## 2020-12-12 DIAGNOSIS — K219 Gastro-esophageal reflux disease without esophagitis: Secondary | ICD-10-CM

## 2020-12-27 DIAGNOSIS — Z961 Presence of intraocular lens: Secondary | ICD-10-CM | POA: Diagnosis not present

## 2020-12-27 DIAGNOSIS — H30033 Focal chorioretinal inflammation, peripheral, bilateral: Secondary | ICD-10-CM | POA: Diagnosis not present

## 2020-12-27 DIAGNOSIS — Z8669 Personal history of other diseases of the nervous system and sense organs: Secondary | ICD-10-CM | POA: Diagnosis not present

## 2020-12-27 DIAGNOSIS — H26492 Other secondary cataract, left eye: Secondary | ICD-10-CM | POA: Diagnosis not present

## 2021-01-02 ENCOUNTER — Telehealth: Payer: Self-pay | Admitting: Family Medicine

## 2021-01-02 NOTE — Progress Notes (Signed)
  Chronic Care Management   Outreach Note  01/02/2021 Name: Ranata Laughery MRN: [id number] DOB: 1940/09/25  Referred by: Marin Olp, MD Reason for referral : No chief complaint on file.   A second unsuccessful telephone outreach was attempted today. The patient was referred to pharmacist for assistance with care management and care coordination.  Follow Up Plan:   Lauretta Grill Upstream Scheduler

## 2021-03-01 NOTE — Progress Notes (Addendum)
Phone [phone]   Subjective:  Patient presents today for their annual physical. Chief complaint-noted.   See problem oriented charting- ROS- full  review of systems was completed and negative except for: fatigue, shortness of breath stable, palpitations at itmes with stress, immunocompromised with long term prednisone, sleep disturbance  The following were reviewed and entered/updated in epic: Past Medical History:  Diagnosis Date   Adenomatous colon polyp    sessile   Allergy    SEASONAL   Anxiety    Cancer ([place])    Skin cancer   Cataract    bilateral   Colon polyps    Diverticulosis of colon (without mention of hemorrhage)    DJD (degenerative joint disease)    Dysrhythmia    pac   Esophagitis    Essential hypertension 06/04/2017   GERD (gastroesophageal reflux disease)    Hiatal hernia    Hypercholesterolemia    IBS (irritable bowel syndrome)    MVP (mitral valve prolapse)    Osteopenia    Personal history of colonic polyps    SESSILE SERRATED ADENOMAS (X2)   PONV (postoperative nausea and vomiting)    Sarcoidosis    Shortness of breath dyspnea    Stricture and stenosis of esophagus    Unspecified gastritis and gastroduodenitis without mention of hemorrhage    Urinary tract infection, site not specified    Uveitis    Patient Active Problem List   Diagnosis Date Noted   Steroid-induced hyperglycemia 10/18/2016    Priority: High   Sarcoidosis 11/21/2011    Priority: High   Essential hypertension 06/04/2017    Priority: Medium   Hyperlipidemia 10/18/2016    Priority: Medium   Osteoporosis 12/06/2014    Priority: Medium   Dyspnea 08/20/2013    Priority: Medium   GERD with stricture 10/12/2011    Priority: Medium   Uveitis 09/21/2007    Priority: Medium   Recurrent UTI 09/09/2007    Priority: Medium   Anxiety state 08/06/2007    Priority: Medium   History of colonic polyps 08/06/2007    Priority: Medium   History of basal cell cancer  10/18/2016    Priority: Low   Compression fx, lumbar spine ([place]) 09/24/2012    Priority: Low   Osteoarthritis 09/21/2007    Priority: Low   IBS (irritable bowel syndrome) 08/06/2007    Priority: Low   LOW BACK PAIN, CHRONIC 08/06/2007    Priority: Low   Posterior capsular opacification non visually significant of left eye 07/01/2018   Posterior capsular opacification of right eye, obscuring vision 07/01/2018   Conjunctivochalasis, bilateral 05/27/2018   Vitreous syneresis, bilateral 05/27/2018   Lamellar macular hole of right eye 02/19/2018   Female bladder prolapse 03/22/2015   Myopia with astigmatism and presbyopia, bilateral 09/07/2014   Intermediate uveitis 07/03/2012   Steroid responders 07/03/2012   Conjunctivochalasis 05/17/2011   Dermatochalasis 05/17/2011   Epiretinal membrane (ERM) of both eyes 05/17/2011   Presence of intraocular lens 05/17/2011   PVD (posterior vitreous detachment), both eyes 05/17/2011   Past Surgical History:  Procedure Laterality Date   BASAL CELL CARCINOMA EXCISION     CATARACT EXTRACTION Bilateral    COLONOSCOPY  2012   MASS EXCISION Left 11/02/2014   Procedure: EXCISION MASS LEFT ELBOW;  Surgeon: Daryll Brod, MD;  Location: [place];  Service: Orthopedics;  Laterality: Left;   POLYPECTOMY     UPPER GASTROINTESTINAL ENDOSCOPY     VESICOVAGINAL FISTULA CLOSURE W/ TAH     "  sling" after hystrectomy per patient    Family History  Problem Relation Age of Onset   Melanoma Mother        per pt melanoma    Colon cancer Sister        sees Dr Henrene Pastor    Congestive Heart Failure Sister        died in 17-Oct-2019 at age 34   Endometrial cancer Sister    Colon polyps Brother    Sarcoidosis Brother    Congestive Heart Failure Brother        died at 46   Colon polyps Sister        Dr stark    Stroke Sister        died in 2019/10/17 at age 87   Colon polyps Sister    Pneumonia Sister        died in her 35s   Lung cancer Brother    Other  Other        nephew with Wegener's    Esophageal cancer Other    Osteoporosis Neg Hx    Rectal cancer Neg Hx    Stomach cancer Neg Hx     Medications- reviewed and updated Current Outpatient Medications  Medication Sig Dispense Refill   amLODipine (NORVASC) 5 MG tablet TAKE 1 TABLET(5 MG) BY MOUTH DAILY 90 tablet 3   B Complex Vitamins (VITAMIN-B COMPLEX PO) Take 1 capsule by mouth daily.     Calcium Carbonate (CALTRATE 600 PO) Take 1 tablet by mouth 2 (two) times daily.     cholecalciferol (VITAMIN D) 1000 units tablet Take 1,000 Units by mouth daily.     esomeprazole (NEXIUM) 40 MG capsule TAKE ONE CAPSULE BY MOUTH TWICE DAILY BEFORE A MEAL (Patient taking differently: 20 mg daily.) 180 capsule 1   Glucosamine HCl (GLUCOSAMINE PO) Liquid form - Take by mouth as directed once daily     meclizine (ANTIVERT) 25 MG tablet Take 1/2-1 tablet by mouth every 4 hours as needed for dizziness     metroNIDAZOLE (METROGEL) 0.75 % gel Apply topically 2 (two) times daily.     Multiple Vitamins-Minerals (MULTIVITAL) tablet Take 1 tablet by mouth daily.     Omega-3 Fatty Acids (FISH OIL) 1000 MG CAPS Take 1 capsule by mouth daily.     ondansetron (ZOFRAN-ODT) 4 MG disintegrating tablet Take 1 tablet (4 mg total) by mouth every 8 (eight) hours as needed for nausea (with vertigo). 30 tablet 1   predniSONE (DELTASONE) 5 MG tablet TAKE 1 TABLET BY MOUTH DAILY WITH BREAKFAST 90 tablet 3   Probiotic Product (ALIGN PO) Take by mouth daily.     simvastatin (ZOCOR) 20 MG tablet TAKE 1 TABLET(20 MG) BY MOUTH AT BEDTIME 90 tablet 3   sucralfate (CARAFATE) 1 g tablet TAKE 1 TABLET(1 GRAM) BY MOUTH TWICE DAILY AS NEEDED 180 tablet 3   tacrolimus (PROTOPIC) 0.1 % ointment Apply topically 2 (two) times daily.     No current facility-administered medications for this visit.    Allergies-reviewed and updated Allergies  Allergen Reactions   Nitrofurantoin Nausea Only    Significant and had to stop after 2-3 doses     Tape Rash    Blisters skin and removes skin --NEEDS PAPER TAPE    Social History   Social History Narrative   Married- husband with parkinsons. Will be patient of Dr. 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 Oropharynx normal Neck: no thyromegaly CV: RRR no murmurs rubs or gallops Lungs: CTAB no crackles, wheeze, rhonchi Abdomen: soft/nontender/nondistended/normal bowel sounds. No rebound or guarding.  Ext: no edema Skin: warm, dry Neuro: grossly normal, moves all extremities, PERRLA   Assessment and Plan   80 y.o. female presenting for annual physical.  Health Maintenance counseling: 1. Anticipatory guidance: Patient counseled regarding regular dental exams -q6 months, eye exams -Dr.Istock yearly, Dr.Dickerson for cateract- later lasre with Dr. Susa Simmonds (has follow up this month), and Dr. Manuella Ghazi retinal specialist,  avoiding smoking and second hand smoke , limiting alcohol to 1 beverage per day- none , no illicit drugs  2. Risk factor reduction:  Advised patient of need for regular exercise and diet rich and fruits and vegetables to reduce risk of heart attack and stroke. Exercise- on back burner with stressors- has pool and exercises at new facility . Diet- stable healthy diet.   weight largely stable this year Wt Readings from Last 3 Encounters:  03/03/21 166 lb 9.6 oz (75.6 kg)  11/18/20 167 lb 6.1 oz (75.9 kg)  09/23/20 167 lb 3.2 oz (75.8 kg)  3. Immunizations/screenings/ancillary studies DISCUSSED:  -COVID-19 vaccination #4- wants to wait for new variant specific immunization -Flu vaccination (last one 04/29/2020)- will call back for  visit - shingles vaccination - at pharmacy recommended  -consider prevnar 20 next year- will discuss Immunization History  Administered Date(s) Administered   Fluad Quad(high Dose 65+) 04/29/2020   Influenza Split 05/23/2011, 05/23/2012, 05/04/2013, 05/31/2014   Influenza Whole 09/11/2009, 05/08/2010   Influenza, High Dose Seasonal PF 05/02/2016, 05/08/2017, 04/25/2019   Influenza-Unspecified 05/25/2015, 04/08/2018   Moderna Sars-Covid-2 Vaccination 08/19/2019, 09/16/2019, 06/06/2020   Pneumococcal Conjugate-13 05/27/2020   Pneumococcal Polysaccharide-23 05/17/2010   Tdap 11/21/2011   4. Cervical cancer screening- past age based screening recommendations. Still gets pelvic examinations with GYN Dr. Marvel Plan  5. Breast cancer screening-  breast exam with GYN and mammogram - formally past age based screening recommendations but received these with GYN   6. Colon cancer screening - 02/19/2019 with 3 year repeat due to polpy and sister with colon cancer history. Also had endoscopy.  7. Skin cancer screening- Dr. Melanee Left in [place] with >35 years once a year  for  dermatology-advised regular sunscreen use. Denies worrisome, changing, or new skin lesions.  8. Birth control/STD check- monogamous and postmenopausal 9. Osteoporosis screening at 26- was referred to endocrinology given evaluated fracture risk and long-term predinisone. Dr. Loanne Drilling recommended Prolia in 2018-  she opted out - also after bone density worsened last year- discussed reclast- with all she has on her plate - we opted to discuss again next visit -fall prevention key -never smoker  Status of chronic or acute concerns   #social update- lost sister and brother to CHF within l1.5 - lone surviving sibling. Patient and husband have moved from 31 home (in process of selling) and moving into 900 sq ft living space at [employer] [facility] in [place]- start independent living in apartment (instead of cottage) but can advance to assisted  living, then skilled care, then memory care. . Still in process of going through materials in home  before selling- hoping to finish this weekend- house closes Monday of next week -started seeing a counselor and that has been helpful -having some palpitations with stress- offered cardiac monitoring but ptaient declines for now- will follow up if symptoms worsen or fail to improve  - wants to trial buspirone as needed  # UTI- history of UTI- prefers to have UA checked with mild burning but not as severe as previous. Last 3 cultures did not show true UTI-she also is going to try to increase water intake.  - prior bladder sling with hysterectomy but bladder still dropped  # rash around mouth - worsened with mask. - Metronidazole 0.75%,twice daily helped through dermatology and protopic 0.1% twice daily - still on these -tries to stay off mask if able but in her county not able to as rate is high  # Steroid induced hyperglycemia/sarcoidosis S: prednisone '5mg'$  daily for sarcoidosis. Majority of A1c's was  below 6.5.   -Followed with Dr. Loanne Drilling with pulmonary for sarcoidosis - she states overdue (Dr. Loanne Drilling was on maternity previously) Lab Results  Component Value Date   HGBA1C 6.3 08/31/2020   A./P: hopefully stable- update a1c today. Continue withoutmeds for now  for sarcoidosis- recommended a follow-up with pulmonology and prednisone. Denies recent issues  #hypertension S: medication: amlodipine 5 mg daily  Home readings #s: occasionally up at home- occasional palpitations with stress of moving BP Readings from Last 3 Encounters:  03/03/21 128/75  11/18/20 120/60  09/23/20 126/74  A/P: Stable. Continue current medications.   # GERD S:Medication: nexium down to 20 mg daily plus carafate. Prior 40 mg twice daily and sucralfate 1 g as needed  B12 levels related to PPI use: does take supplement and prior levels adequate despite PPI Lab Results  Component Value Date   VITAMINB12 1,039  (H) 05/08/2018   A/P: reasonable control- continue current meds   #hyperlipidemia S: Medication:simvastatin 20 mg at bedtime Lab Results  Component Value Date   CHOL 176 08/31/2020   HDL 47.50 08/31/2020   LDLCALC 82 05/08/2018   LDLDIRECT 113.0 08/31/2020   TRIG 237.0 (H) 08/31/2020   CHOLHDL 4 08/31/2020   A/P: poor control with moving- we opted to wait and recheck next visit to give her some more time to work on this.   Recommended follow up: Return in about 6 months (around 09/03/2021) for follow-up or sooner as needed..  Lab/Order associations:NOT fasting   ICD-10-CM   1. GERD with stricture  K21.9    K22.2     2. Recurrent UTI  N39.0     3. Sarcoidosis  D86.9     4. Essential hypertension  I10     5. Hyperlipidemia, unspecified hyperlipidemia type  E78.5     6. Steroid-induced hyperglycemia  R73.9    T38.0X5A     7. Dysuria  R30.0     8. Preventative health care  Z00.00       No orders of the defined types were placed in this encounter.  I,Jada Bradford,acting as a scribe for Garret Reddish, MD.,have documented all relevant documentation on the behalf of Garret Reddish, MD,as directed by  Garret Reddish, MD while in the presence of Garret Reddish, MD.  I, Garret Reddish, MD, have reviewed all documentation for this visit. The documentation on 03/03/21 for the exam, diagnosis, procedures, and orders are all accurate and complete.   Return precautions advised.  Garret Reddish, MD

## 2021-03-03 ENCOUNTER — Other Ambulatory Visit: Payer: Self-pay

## 2021-03-03 ENCOUNTER — Ambulatory Visit (INDEPENDENT_AMBULATORY_CARE_PROVIDER_SITE_OTHER): Payer: Medicare Other | Admitting: Family Medicine

## 2021-03-03 ENCOUNTER — Encounter: Payer: Self-pay | Admitting: Family Medicine

## 2021-03-03 VITALS — BP 128/75 | HR 69 | Temp 98.3°F | Ht 68.0 in | Wt 166.6 lb

## 2021-03-03 DIAGNOSIS — Z0000 Encounter for general adult medical examination without abnormal findings: Secondary | ICD-10-CM | POA: Diagnosis not present

## 2021-03-03 DIAGNOSIS — K222 Esophageal obstruction: Secondary | ICD-10-CM

## 2021-03-03 DIAGNOSIS — N390 Urinary tract infection, site not specified: Secondary | ICD-10-CM

## 2021-03-03 DIAGNOSIS — I10 Essential (primary) hypertension: Secondary | ICD-10-CM | POA: Diagnosis not present

## 2021-03-03 DIAGNOSIS — K219 Gastro-esophageal reflux disease without esophagitis: Secondary | ICD-10-CM | POA: Diagnosis not present

## 2021-03-03 DIAGNOSIS — R300 Dysuria: Secondary | ICD-10-CM

## 2021-03-03 DIAGNOSIS — T380X5A Adverse effect of glucocorticoids and synthetic analogues, initial encounter: Secondary | ICD-10-CM | POA: Diagnosis not present

## 2021-03-03 DIAGNOSIS — R739 Hyperglycemia, unspecified: Secondary | ICD-10-CM

## 2021-03-03 DIAGNOSIS — E785 Hyperlipidemia, unspecified: Secondary | ICD-10-CM | POA: Diagnosis not present

## 2021-03-03 DIAGNOSIS — D869 Sarcoidosis, unspecified: Secondary | ICD-10-CM | POA: Diagnosis not present

## 2021-03-03 MED ORDER — BUSPIRONE HCL 5 MG PO TABS: 5.0000 mg | ORAL_TABLET | Freq: Two times a day (BID) | ORAL | 5 refills | Status: DC | PRN

## 2021-03-03 NOTE — Addendum Note (Signed)
Addended by: Marin Olp on: 03/03/2021 11:47 AM   Modules accepted: Orders

## 2021-03-03 NOTE — Patient Instructions (Addendum)
Health Maintenance Due  Topic Date Due   Zoster Vaccines- Shingrix (1 of 2)   Please check with your pharmacy to see if they have the shingrix vaccine. If they do- please get this immunization and update [place] by phone call or mychart with dates you receive the vaccine  Never done   COVID-19 Vaccine (4 - Booster for Moderna series)   -Will get this scheduled in the fall.  09/06/2020   INFLUENZA VACCINE   -Please consider getting your flu shot in the Fall. If you get this outside of our office, please let us know.  02/20/2021    consider Prevnar 20 next year  Wanted to hold off on bone density meds  Best of luck on the final steps of your transition  Recommended follow up: Return in about 6 months (around 09/03/2021) for follow-up or sooner as needed.[name]

## 2021-03-10 NOTE — Addendum Note (Signed)
Addended by: Doran Clay A on: 03/10/2021 08:52 AM   Modules accepted: Orders

## 2021-03-17 ENCOUNTER — Other Ambulatory Visit (INDEPENDENT_AMBULATORY_CARE_PROVIDER_SITE_OTHER): Payer: Medicare Other

## 2021-03-17 ENCOUNTER — Other Ambulatory Visit: Payer: Self-pay

## 2021-03-17 DIAGNOSIS — T380X5A Adverse effect of glucocorticoids and synthetic analogues, initial encounter: Secondary | ICD-10-CM | POA: Diagnosis not present

## 2021-03-17 DIAGNOSIS — R300 Dysuria: Secondary | ICD-10-CM | POA: Diagnosis not present

## 2021-03-17 DIAGNOSIS — I10 Essential (primary) hypertension: Secondary | ICD-10-CM | POA: Diagnosis not present

## 2021-03-17 DIAGNOSIS — E785 Hyperlipidemia, unspecified: Secondary | ICD-10-CM | POA: Diagnosis not present

## 2021-03-17 DIAGNOSIS — R739 Hyperglycemia, unspecified: Secondary | ICD-10-CM

## 2021-03-17 LAB — CBC WITH DIFFERENTIAL/PLATELET
Basophils Absolute: 0 10*3/uL (ref 0.0–0.1)
Basophils Relative: 0.8 % (ref 0.0–3.0)
Eosinophils Absolute: 0.1 10*3/uL (ref 0.0–0.7)
Eosinophils Relative: 1.7 % (ref 0.0–5.0)
HCT: 44.5 % (ref 36.0–46.0)
Hemoglobin: 15 g/dL (ref 12.0–15.0)
Lymphocytes Relative: 25.5 % (ref 12.0–46.0)
Lymphs Abs: 1.6 10*3/uL (ref 0.7–4.0)
MCHC: 33.7 g/dL (ref 30.0–36.0)
MCV: 91.8 fl (ref 78.0–100.0)
Monocytes Absolute: 0.6 10*3/uL (ref 0.1–1.0)
Monocytes Relative: 9.3 % (ref 3.0–12.0)
Neutro Abs: 3.8 10*3/uL (ref 1.4–7.7)
Neutrophils Relative %: 62.7 % (ref 43.0–77.0)
Platelets: 242 10*3/uL (ref 150.0–400.0)
RBC: 4.84 Mil/uL (ref 3.87–5.11)
RDW: 13.1 % (ref 11.5–15.5)
WBC: 6.1 10*3/uL (ref 4.0–10.5)

## 2021-03-17 LAB — COMPREHENSIVE METABOLIC PANEL
ALT: 15 U/L (ref 0–35)
AST: 16 U/L (ref 0–37)
Albumin: 3.9 g/dL (ref 3.5–5.2)
Alkaline Phosphatase: 70 U/L (ref 39–117)
BUN: 15 mg/dL (ref 6–23)
CO2: 30 mEq/L (ref 19–32)
Calcium: 9.4 mg/dL (ref 8.4–10.5)
Chloride: 102 mEq/L (ref 96–112)
Creatinine, Ser: 0.7 mg/dL (ref 0.40–1.20)
GFR: 81.57 mL/min (ref >60.00)
Glucose, Bld: 85 mg/dL (ref 70–99)
Potassium: 3.8 mEq/L (ref 3.5–5.1)
Sodium: 140 mEq/L (ref 135–145)
Total Bilirubin: 0.9 mg/dL (ref 0.2–1.2)
Total Protein: 6.8 g/dL (ref 6.0–8.3)

## 2021-03-17 LAB — LIPID PANEL
Cholesterol: 152 mg/dL (ref 0–200)
HDL: 47.1 mg/dL (ref >39.00)
LDL Cholesterol: 74 mg/dL (ref 0–99)
NonHDL: 104.43
Total CHOL/HDL Ratio: 3
Triglycerides: 153 mg/dL — ABNORMAL HIGH (ref 0.0–149.0)
VLDL: 30.6 mg/dL (ref 0.0–40.0)

## 2021-03-17 LAB — HEMOGLOBIN A1C: Hgb A1c MFr Bld: 6.3 % (ref 4.6–6.5)

## 2021-03-18 LAB — URINE CULTURE
MICRO NUMBER:: 12296782
SPECIMEN QUALITY:: ADEQUATE

## 2021-05-09 ENCOUNTER — Telehealth: Payer: Self-pay | Admitting: Pulmonary Disease

## 2021-05-09 DIAGNOSIS — D869 Sarcoidosis, unspecified: Secondary | ICD-10-CM

## 2021-05-10 NOTE — Telephone Encounter (Signed)
I have called and LM on VM for Opal Sidles, pts niece.  Just needed to know if the pt has moved down that way and will she be coming back to see Dr. Loanne Drilling?  thanks

## 2021-05-10 NOTE — Telephone Encounter (Signed)
Referral has been placed to get her set up with new pulm in [place].  Nothing further is needed.

## 2021-05-10 NOTE — Telephone Encounter (Signed)
JE please advise if we can place the referral for the new pulmonologist in [place], [place] for the pt.  She recently moved.  Thanks

## 2021-05-10 NOTE — Telephone Encounter (Signed)
Ok to place referral.

## 2021-05-10 NOTE — Telephone Encounter (Signed)
Laura [name] states patient moved to [place]. Patient scheduled to see Dr. Loanne Drilling on 05/23/2021. Patient needs referral to new pulmonary doctor in [place].

## 2021-05-20 ENCOUNTER — Other Ambulatory Visit: Payer: Self-pay | Admitting: Family Medicine

## 2021-05-23 ENCOUNTER — Encounter: Payer: Self-pay | Admitting: Pulmonary Disease

## 2021-05-23 ENCOUNTER — Ambulatory Visit: Payer: Medicare Other | Admitting: Pulmonary Disease

## 2021-05-23 ENCOUNTER — Other Ambulatory Visit: Payer: Self-pay

## 2021-05-23 ENCOUNTER — Ambulatory Visit (INDEPENDENT_AMBULATORY_CARE_PROVIDER_SITE_OTHER): Payer: Medicare Other

## 2021-05-23 VITALS — BP 122/60 | HR 66 | Temp 98.7°F | Ht 68.0 in | Wt 169.0 lb

## 2021-05-23 DIAGNOSIS — D869 Sarcoidosis, unspecified: Secondary | ICD-10-CM

## 2021-05-23 NOTE — Patient Instructions (Signed)
Pulmonary sarcoidosis with uveitis  - Plan for CXR today - Continue prednisone 5 mg daily - Will arrange for annual PFTs at next visit.  Last PFTs 12/2015. Mild obstructive defect with mildly reduced DLCO  Follow-up with me in 3 months after PFTs (Feb)

## 2021-05-23 NOTE — Progress Notes (Signed)
Subjective:   PATIENT ID: Laura [name] GENDER: female DOB: Apr 27, 1941, MRN: [id number]   HPI  Chief Complaint  Patient presents with   Follow-up    Sarcoid, not feeling any different   [name] is a 80 year old female never smoker with sarcoidosis who presents for follow-up.  Synopsis She was initially diagnosed with sarcoid in 2017 and started on a prednisone taper at that time. Since joining our clinic she has been followed by Dr. Lenna Gilford.  She is recently recovering from a viral illness, negative for covid. She has recent stressors including two siblings passing in 2021. She has palpitations and shortness of breath that she feels is emotionally related.  Otherwise her dyspnea as been stable. Has been limiting time at home to care for her husband with Parkinsons. Denies fevers, chills, cough and wheezing.  05/23/21 Over the summer, she moved from her home to apartment due to her husband's Parkinson's and Lewy bodies dementia. She is pleased with the care she is receiving but is difficult to watch her husband's memory decline. She reports her breathing is stable on low-dose prednisone. Denies cough or wheezing. Some shortness of breath with exertion.   Outpatient Medications Prior to Visit  Medication Sig Dispense Refill   amLODipine (NORVASC) 5 MG tablet TAKE 1 TABLET(5 MG) BY MOUTH DAILY 90 tablet 3   B Complex Vitamins (VITAMIN-B COMPLEX PO) Take 1 capsule by mouth daily.     Calcium Carbonate (CALTRATE 600 PO) Take 1 tablet by mouth 2 (two) times daily.     cholecalciferol (VITAMIN D) 1000 units tablet Take 1,000 Units by mouth daily.     esomeprazole (NEXIUM) 40 MG capsule TAKE ONE CAPSULE BY MOUTH TWICE DAILY BEFORE A MEAL (Patient taking differently: 20 mg daily.) 180 capsule 1   Glucosamine HCl (GLUCOSAMINE PO) Liquid form - Take by mouth as directed once daily     meclizine (ANTIVERT) 25 MG tablet Take 1/2-1 tablet by mouth every 4 hours as needed for dizziness      metroNIDAZOLE (METROGEL) 0.75 % gel Apply topically 2 (two) times daily.     Multiple Vitamins-Minerals (MULTIVITAL) tablet Take 1 tablet by mouth daily.     Omega-3 Fatty Acids (FISH OIL) 1000 MG CAPS Take 1 capsule by mouth daily.     ondansetron (ZOFRAN-ODT) 4 MG disintegrating tablet Take 1 tablet (4 mg total) by mouth every 8 (eight) hours as needed for nausea (with vertigo). 30 tablet 1   predniSONE (DELTASONE) 5 MG tablet TAKE 1 TABLET BY MOUTH DAILY WITH BREAKFAST 90 tablet 3   Probiotic Product (ALIGN PO) Take by mouth daily.     simvastatin (ZOCOR) 20 MG tablet TAKE 1 TABLET(20 MG) BY MOUTH AT BEDTIME 90 tablet 3   sucralfate (CARAFATE) 1 g tablet TAKE 1 TABLET(1 GRAM) BY MOUTH TWICE DAILY AS NEEDED 180 tablet 3   tacrolimus (PROTOPIC) 0.1 % ointment Apply topically 2 (two) times daily.     busPIRone (BUSPAR) 5 MG tablet Take 1 tablet (5 mg total) by mouth 2 (two) times daily as needed (for anxiety). (Patient not taking: Reported on 05/23/2021) 60 tablet 5   No facility-administered medications prior to visit.    Review of Systems  Constitutional:  Negative for chills, diaphoresis, fever, malaise/fatigue and weight loss.  HENT:  Negative for congestion.   Respiratory:  Positive for shortness of breath. Negative for cough, hemoptysis, sputum production and wheezing.   Cardiovascular:  Negative for chest pain, palpitations and  leg swelling.   Objective:   Vitals:   05/23/21 1349  BP: 122/60  Pulse: 66  Temp: 98.7 F (37.1 C)  TempSrc: Oral  SpO2: 97%  Weight: 169 lb (76.7 kg)  Height: 5\' 8"  (1.727 m)   Physical Exam: General: Elderly-appearing, no acute distress HENT: [place], AT Eyes: EOMI, no scleral icterus Respiratory: Clear to auscultation bilaterally.  No crackles, wheezing or rales Cardiovascular: RRR, -M/R/G, no JVD Extremities:-Edema,-tenderness Neuro: AAO x4, CNII-XII grossly intact Psych: Normal mood, normal affect  Interim Data Chest imaging: CXR  07/03/2018-bilateral hilar prominence, unchanged compared to prior CXR 01/08/19-unchanged hilar and perihilar lung opacities CXR 07/27/19 - unchanged hilar and perihilar lung opacities CXR 05/23/21 -unchanged hilar adenopathy. Chronic interstitial changes/scarring  PFT: 01/06/2016- mild obstructive defect FEV1 77%. Normal TLC. Mild reduced uncorrected DLCO     Assessment & Plan:   Discussion: 80 year old female never smoker with pulmonary and ocular involvement who presents for follow-up. Well-controlled on low-dose prednisone.  Pulmonary sarcoidosis with uveitis - stable - Plan for CXR today - Continue prednisone 5 mg daily - Will arrange for annual PFTs at next visit.  Last PFTs 12/2015. Mild obstructive defect with mildly reduced DLCO - Recommend annual ophthalmology exam.  Seen 12/2020 by Dr. Susa Simmonds at [facility]. Last seen 09/2020 by with Dr. Manuella Ghazi at [facility] to schedule March 2024 - EKG: Normal sinus rhythm. No conduction abnormalities.  Health Maintenance Immunization History  Administered Date(s) Administered   Fluad Quad(high Dose 65+) 04/29/2020   Influenza Split 05/23/2011, 05/23/2012, 05/04/2013, 05/31/2014   Influenza Whole 09/11/2009, 05/08/2010   Influenza, High Dose Seasonal PF 05/02/2016, 05/08/2017, 04/25/2019   Influenza-Unspecified 05/25/2015, 04/08/2018   Moderna Sars-Covid-2 Vaccination 08/19/2019, 09/16/2019, 06/06/2020   Pneumococcal Conjugate-13 05/27/2020   Pneumococcal Polysaccharide-23 05/17/2010   Tdap 11/21/2011   Orders Placed This Encounter  Procedures   DG Chest 2 View    Standing Status:   Future    Number of Occurrences:   1    Standing Expiration Date:   05/23/2022    Order Specific Question:   Reason for Exam (SYMPTOM  OR DIAGNOSIS REQUIRED)    Answer:   sarcoid    Order Specific Question:   Preferred imaging location?    Answer:   Internal   Pulmonary function test    Standing Status:   Future    Standing Expiration Date:   05/23/2022     Order Specific Question:   Where should this test be performed?    Answer:   [facility]   I have spent a total time of 32-minutes on the day of the appointment reviewing prior documentation, coordinating care and discussing medical diagnosis and plan with the patient/family. Past medical history, allergies, medications were reviewed. Pertinent imaging, labs and tests included in this note have been reviewed and interpreted independently by me.  Shaquna Geigle Rodman Pickle, MD [facility] 05/23/2021 1:52 PM

## 2021-05-29 ENCOUNTER — Encounter: Payer: Self-pay | Admitting: Pulmonary Disease

## 2021-07-13 ENCOUNTER — Encounter: Payer: Self-pay | Admitting: Family Medicine

## 2021-07-13 ENCOUNTER — Ambulatory Visit (INDEPENDENT_AMBULATORY_CARE_PROVIDER_SITE_OTHER): Payer: Medicare Other | Admitting: Family Medicine

## 2021-07-13 ENCOUNTER — Telehealth: Payer: Self-pay

## 2021-07-13 VITALS — BP 126/64 | HR 72 | Temp 98.2°F | Ht 68.0 in | Wt 168.0 lb

## 2021-07-13 DIAGNOSIS — Z636 Dependent relative needing care at home: Secondary | ICD-10-CM | POA: Diagnosis not present

## 2021-07-13 DIAGNOSIS — R4589 Other symptoms and signs involving emotional state: Secondary | ICD-10-CM

## 2021-07-13 DIAGNOSIS — I10 Essential (primary) hypertension: Secondary | ICD-10-CM

## 2021-07-13 DIAGNOSIS — E785 Hyperlipidemia, unspecified: Secondary | ICD-10-CM | POA: Diagnosis not present

## 2021-07-13 DIAGNOSIS — F411 Generalized anxiety disorder: Secondary | ICD-10-CM

## 2021-07-13 MED ORDER — SERTRALINE HCL 25 MG PO TABS: 25.0000 mg | ORAL_TABLET | Freq: Every day | ORAL | 3 refills | Status: DC

## 2021-07-13 NOTE — Telephone Encounter (Signed)
error 
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
## 2021-07-13 NOTE — Patient Instructions (Addendum)
Let's have you take the buspirone half tablet of 5 mg four times a day for first 2 weeks, then go down to 3x a day for 1 week, 2x a day for 1 week, 1x a day for 1 week then stop.   Start sertraline/zoloft 25 mg and give me an update in 4 weeks on how you are doing- we may bump the dose then schedule to see me around 2 months.   Taking the medicine as directed and not missing any doses is one of the best things you can do to treat your anxiety/depressed mood.  Here are some things to keep in mind:  Side effects (stomach upset, some increased anxiety) may happen before you notice a benefit.  These side effects typically go away over time. Changes to your dose of medicine or a change in medication all together is sometimes necessary Most people need to be on medication at least 6-12 months Many people will notice an improvement within two weeks but the full effect of the medication can take up to 4-6 weeks Stopping the medication when you start feeling better often results in a return of symptoms If you start having thoughts of hurting yourself or others after starting this medicine, call our office immediately at [phone] or seek care through 911.    Also: Please call [phone] to schedule a visit with [facility] - please tell the office you were directly referred by Dr. Yong Channel   Recommended follow up: Return in about 2 months (around 09/13/2021).

## 2021-08-11 ENCOUNTER — Encounter: Payer: Self-pay | Admitting: Family Medicine

## 2021-09-08 ENCOUNTER — Ambulatory Visit (INDEPENDENT_AMBULATORY_CARE_PROVIDER_SITE_OTHER): Payer: Medicare Other

## 2021-09-08 ENCOUNTER — Ambulatory Visit: Payer: Medicare Other | Admitting: Family Medicine

## 2021-09-08 ENCOUNTER — Other Ambulatory Visit: Payer: Self-pay

## 2021-09-08 VITALS — BP 130/62 | HR 72 | Temp 98.0°F | Wt 165.2 lb

## 2021-09-08 DIAGNOSIS — Z0000 Encounter for general adult medical examination without abnormal findings: Secondary | ICD-10-CM | POA: Diagnosis not present

## 2021-09-08 NOTE — Patient Instructions (Signed)
Laura [name] , Thank you for taking time to come for your Medicare Wellness Visit. I appreciate your ongoing commitment to your health goals. Please review the following plan we discussed and let me know if I can assist you in the future.   Screening recommendations/referrals: Colonoscopy: Done 02/19/19 repeat every 3 years  Mammogram: No longer required  Bone Density: Done 12/23/19 repeat every 2 years  Recommended yearly ophthalmology/optometry visit for glaucoma screening and checkup Recommended yearly dental visit for hygiene and checkup  Vaccinations: Influenza vaccine: Done 04/24/21 repeat every year  Pneumococcal vaccine: Up to date Tdap vaccine: Done 11/21/11 repeat every 10 years DUE 11/20/21 Shingles vaccine: Shingrix discussed. Please contact your pharmacy for coverage information.    Covid-19:Completed 1/27, 2/24, 06/06/20  Advanced directives: Please bring a copy of your health care power of attorney and living will to the office at your convenience.  Conditions/risks identified: None at this time   Next appointment: Follow up in one year for your annual wellness visit    Preventive Care 65 Years and Older, Female Preventive care refers to lifestyle choices and visits with your health care provider that can promote health and wellness. What does preventive care include? A yearly physical exam. This is also called an annual well check. Dental exams once or twice a year. Routine eye exams. Ask your health care provider how often you should have your eyes checked. Personal lifestyle choices, including: Daily care of your teeth and gums. Regular physical activity. Eating a healthy diet. Avoiding tobacco and drug use. Limiting alcohol use. Practicing safe sex. Taking low-dose aspirin every day. Taking vitamin and mineral supplements as recommended by your health care provider. What happens during an annual well check? The services and screenings done by your health care provider  during your annual well check will depend on your age, overall health, lifestyle risk factors, and family history of disease. Counseling  Your health care provider may ask you questions about your: Alcohol use. Tobacco use. Drug use. Emotional well-being. Home and relationship well-being. Sexual activity. Eating habits. History of falls. Memory and ability to understand (cognition). Work and work Statistician. Reproductive health. Screening  You may have the following tests or measurements: Height, weight, and BMI. Blood pressure. Lipid and cholesterol levels. These may be checked every 5 years, or more frequently if you are over 4 years old. Skin check. Lung cancer screening. You may have this screening every year starting at age 43 if you have a 30-pack-year history of smoking and currently smoke or have quit within the past 15 years. Fecal occult blood test (FOBT) of the stool. You may have this test every year starting at age 13. Flexible sigmoidoscopy or colonoscopy. You may have a sigmoidoscopy every 5 years or a colonoscopy every 10 years starting at age 52. Hepatitis C blood test. Hepatitis B blood test. Sexually transmitted disease (STD) testing. Diabetes screening. This is done by checking your blood sugar (glucose) after you have not eaten for a while (fasting). You may have this done every 1-3 years. Bone density scan. This is done to screen for osteoporosis. You may have this done starting at age 34. Mammogram. This may be done every 1-2 years. Talk to your health care provider about how often you should have regular mammograms. Talk with your health care provider about your test results, treatment options, and if necessary, the need for more tests. Vaccines  Your health care provider may recommend certain vaccines, such as: Influenza vaccine. This is  recommended every year. Tetanus, diphtheria, and acellular pertussis (Tdap, Td) vaccine. You may need a Td booster every  10 years. Zoster vaccine. You may need this after age 18. Pneumococcal 13-valent conjugate (PCV13) vaccine. One dose is recommended after age 27. Pneumococcal polysaccharide (PPSV23) vaccine. One dose is recommended after age 57. Talk to your health care provider about which screenings and vaccines you need and how often you need them. This information is not intended to replace advice given to you by your health care provider. Make sure you discuss any questions you have with your health care provider. Document Released: 08/05/2015 Document Revised: 03/28/2016 Document Reviewed: 05/10/2015 Elsevier Interactive Patient Education  2017 [place] Prevention in the Home Falls can cause injuries. They can happen to people of all ages. There are many things you can do to make your home safe and to help prevent falls. What can I do on the outside of my home? Regularly fix the edges of walkways and driveways and fix any cracks. Remove anything that might make you trip as you walk through a door, such as a raised step or threshold. Trim any bushes or trees on the path to your home. Use bright outdoor lighting. Clear any walking paths of anything that might make someone trip, such as rocks or tools. Regularly check to see if handrails are loose or broken. Make sure that both sides of any steps have handrails. Any raised decks and porches should have guardrails on the edges. Have any leaves, snow, or ice cleared regularly. Use sand or salt on walking paths during winter. Clean up any spills in your garage right away. This includes oil or grease spills. What can I do in the bathroom? Use night lights. Install grab bars by the toilet and in the tub and shower. Do not use towel bars as grab bars. Use non-skid mats or decals in the tub or shower. If you need to sit down in the shower, use a plastic, non-slip stool. Keep the floor dry. Clean up any water that spills on the floor as soon as it  happens. Remove soap buildup in the tub or shower regularly. Attach bath mats securely with double-sided non-slip rug tape. Do not have throw rugs and other things on the floor that can make you trip. What can I do in the bedroom? Use night lights. Make sure that you have a light by your bed that is easy to reach. Do not use any sheets or blankets that are too big for your bed. They should not hang down onto the floor. Have a firm chair that has side arms. You can use this for support while you get dressed. Do not have throw rugs and other things on the floor that can make you trip. What can I do in the kitchen? Clean up any spills right away. Avoid walking on wet floors. Keep items that you use a lot in easy-to-reach places. If you need to reach something above you, use a strong step stool that has a grab bar. Keep electrical cords out of the way. Do not use floor polish or wax that makes floors slippery. If you must use wax, use non-skid floor wax. Do not have throw rugs and other things on the floor that can make you trip. What can I do with my stairs? Do not leave any items on the stairs. Make sure that there are handrails on both sides of the stairs and use them. Fix handrails that are  broken or loose. Make sure that handrails are as long as the stairways. Check any carpeting to make sure that it is firmly attached to the stairs. Fix any carpet that is loose or worn. Avoid having throw rugs at the top or bottom of the stairs. If you do have throw rugs, attach them to the floor with carpet tape. Make sure that you have a light switch at the top of the stairs and the bottom of the stairs. If you do not have them, ask someone to add them for you. What else can I do to help prevent falls? Wear shoes that: Do not have high heels. Have rubber bottoms. Are comfortable and fit you well. Are closed at the toe. Do not wear sandals. If you use a stepladder: Make sure that it is fully opened.  Do not climb a closed stepladder. Make sure that both sides of the stepladder are locked into place. Ask someone to hold it for you, if possible. Clearly mark and make sure that you can see: Any grab bars or handrails. First and last steps. Where the edge of each step is. Use tools that help you move around (mobility aids) if they are needed. These include: Canes. Walkers. Scooters. Crutches. Turn on the lights when you go into a dark area. Replace any light bulbs as soon as they burn out. Set up your furniture so you have a clear path. Avoid moving your furniture around. If any of your floors are uneven, fix them. If there are any pets around you, be aware of where they are. Review your medicines with your doctor. Some medicines can make you feel dizzy. This can increase your chance of falling. Ask your doctor what other things that you can do to help prevent falls. This information is not intended to replace advice given to you by your health care provider. Make sure you discuss any questions you have with your health care provider. Document Released: 05/05/2009 Document Revised: 12/15/2015 Document Reviewed: 08/13/2014 Elsevier Interactive Patient Education  2017 [employer].

## 2021-09-08 NOTE — Progress Notes (Signed)
Subjective:   Laura [name] is a 81 y.o. female who presents for Medicare Annual (Subsequent) preventive examination.  Review of Systems     Cardiac Risk Factors include: advanced age (>97men, >47 women);dyslipidemia;hypertension     Objective:    Today's Vitals   09/08/21 1144  BP: 130/62  Pulse: 72  Temp: 98 F (36.7 C)  SpO2: 93%  Weight: 165 lb 3.2 oz (74.9 kg)   Body mass index is 25.12 kg/m.  Advanced Directives 09/08/2021 12/12/2017 12/30/2016 10/26/2016 01/17/2016 01/03/2016 10/18/2015  Does Patient Have a Medical Advance Directive? Yes Yes Yes Yes Yes Yes Yes  Type of [employer] [place];Living will - - Living will;Healthcare Power of Attorney Living will  Does patient want to make changes to medical advance directive? - - - - - - -  Copy of [place] in Chart? No - copy requested - No - copy requested - No - copy requested - -  Would patient like information on creating a medical advance directive? - - - - - - -    Current Medications (verified) Outpatient Encounter Medications as of 09/08/2021  Medication Sig   amLODipine (NORVASC) 5 MG tablet TAKE 1 TABLET(5 MG) BY MOUTH DAILY   B Complex Vitamins (VITAMIN-B COMPLEX PO) Take 1 capsule by mouth daily.   Calcium Carbonate (CALTRATE 600 PO) Take 1 tablet by mouth 2 (two) times daily.   cholecalciferol (VITAMIN D) 1000 units tablet Take 1,000 Units by mouth daily.   esomeprazole (NEXIUM) 40 MG capsule TAKE ONE CAPSULE BY MOUTH TWICE DAILY BEFORE A MEAL (Patient taking differently: 20 mg daily.)   Glucosamine HCl (GLUCOSAMINE PO) Liquid form - Take by mouth as directed once daily   meclizine (ANTIVERT) 25 MG tablet Take 1/2-1 tablet by mouth every 4 hours as needed for dizziness   metroNIDAZOLE (METROGEL) 0.75 % gel Apply topically 2 (two) times daily.   Multiple Vitamins-Minerals (MULTIVITAL) tablet Take 1 tablet by mouth daily. 2- 3 times  a week   Omega-3 Fatty Acids (FISH OIL) 1000 MG CAPS Take 1 capsule by mouth daily.   ondansetron (ZOFRAN-ODT) 4 MG disintegrating tablet Take 1 tablet (4 mg total) by mouth every 8 (eight) hours as needed for nausea (with vertigo).   predniSONE (DELTASONE) 5 MG tablet TAKE 1 TABLET BY MOUTH DAILY WITH BREAKFAST   Probiotic Product (ALIGN PO) Take by mouth daily.   sertraline (ZOLOFT) 25 MG tablet Take 1 tablet (25 mg total) by mouth daily.   simvastatin (ZOCOR) 20 MG tablet TAKE 1 TABLET(20 MG) BY MOUTH AT BEDTIME   sucralfate (CARAFATE) 1 g tablet TAKE 1 TABLET(1 GRAM) BY MOUTH TWICE DAILY AS NEEDED   tacrolimus (PROTOPIC) 0.1 % ointment Apply topically 2 (two) times daily.   FLUZONE HIGH-DOSE QUADRIVALENT 0.7 ML SUSY    [DISCONTINUED] busPIRone (BUSPAR) 5 MG tablet Take 1 tablet (5 mg total) by mouth 2 (two) times daily as needed (for anxiety).   No facility-administered encounter medications on file as of 09/08/2021.    Allergies (verified) Nitrofurantoin and Tape   History: Past Medical History:  Diagnosis Date   Adenomatous colon polyp    sessile   Allergy    SEASONAL   Anxiety    Cancer ([place])    Skin cancer   Cataract    bilateral   Colon polyps    Diverticulosis of colon (without mention of hemorrhage)    DJD (degenerative joint  disease)    Dysrhythmia    pac   Esophagitis    Essential hypertension 06/04/2017   GERD (gastroesophageal reflux disease)    Hiatal hernia    Hypercholesterolemia    IBS (irritable bowel syndrome)    MVP (mitral valve prolapse)    Osteopenia    Personal history of colonic polyps    SESSILE SERRATED ADENOMAS (X2)   PONV (postoperative nausea and vomiting)    Sarcoidosis    Shortness of breath dyspnea    Stricture and stenosis of esophagus    Unspecified gastritis and gastroduodenitis without mention of hemorrhage    Urinary tract infection, site not specified    Uveitis    Past Surgical History:  Procedure Laterality Date   BASAL  CELL CARCINOMA EXCISION     CATARACT EXTRACTION Bilateral    COLONOSCOPY  09-Oct-2010   MASS EXCISION Left 11/02/2014   Procedure: EXCISION MASS LEFT ELBOW;  Surgeon: Daryll Brod, MD;  Location: [facility];  Service: Orthopedics;  Laterality: Left;   POLYPECTOMY     UPPER GASTROINTESTINAL ENDOSCOPY     VESICOVAGINAL FISTULA CLOSURE W/ TAH     "sling" after hystrectomy per patient   Family History  Problem Relation Age of Onset   Melanoma Mother        per pt melanoma    Colon cancer Sister        sees Dr Henrene Pastor    Congestive Heart Failure Sister        died in 10/09/2019 at age 70   Endometrial cancer Sister    Colon polyps Brother    Sarcoidosis Brother    Congestive Heart Failure Brother        died at 85   Colon polyps Sister        Dr stark    Stroke Sister        died in 09-Oct-2019 at age 23   Colon polyps Sister    Pneumonia Sister        died in her 55s   Lung cancer Brother    Other Other        nephew with Wegener's    Esophageal cancer Other    Osteoporosis Neg Hx    Rectal cancer Neg Hx    Stomach cancer Neg Hx    Social History   Socioeconomic History   Marital status: Married    Spouse name: Sacoya Mcgourty   Number of children: 2   Years of education: Not on file   Highest education level: Not on file  Occupational History   Occupation: Retired    Comment: & Part Time Artist  Tobacco Use   Smoking status: Never   Smokeless tobacco: Never  Substance and Sexual Activity   Alcohol use: Not Currently    Alcohol/week: 1.0 standard drink    Types: 1 Glasses of wine per week    Comment: occ wine    Drug use: No   Sexual activity: Not on file  Other Topics Concern   Not on file  Social History Narrative   Married- husband with parkinsons. Will be patient of Dr. 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), bookkeeping prior      Hobbies: cooking, florist work, Barrister's clerk   Social Determinants of Adult nurse Strain: Low Risk    Difficulty of Paying Living Expenses: Not hard at Outlook: No  Food Insecurity   Worried About Charity fundraiser in the Last Year: Never true   Ran Out of Food in the Last Year: Never true  Transportation Needs: No Transportation Needs   Lack of Transportation (Medical): No   Lack of Transportation (Non-Medical): No  Physical Activity: Inactive   Days of Exercise per Week: 0 days   Minutes of Exercise per Session: 0 min  Stress: Stress Concern Present   Feeling of Stress : To some extent  Social Connections: Moderately Integrated   Frequency of Communication with Friends and Family: More than three times a week   Frequency of Social Gatherings with Friends and Family: Three times a week   Attends Religious Services: More than 4 times per year   Active Member of Clubs or Organizations: No   Attends Archivist Meetings: Never   Marital Status: Married    Tobacco Counseling Counseling given: Not Answered   Clinical Intake:  Pre-visit preparation completed: Yes  Pain : No/denies pain     BMI - recorded: 25.12 Nutritional Status: BMI 25 -29 Overweight Nutritional Risks: None Diabetes: No  How often do you need to have someone help you when you read instructions, pamphlets, or other written materials from your doctor or pharmacy?: 1 - Never  Diabetic?no  Interpreter Needed?: No  Information entered by :: Charlott Rakes, LPN   Activities of Daily Living In your present state of health, do you have any difficulty performing the following activities: 09/08/2021 03/03/2021  Hearing? N N  Vision? N N  Difficulty concentrating or making decisions? N N  Walking or climbing stairs? N N  Dressing or bathing? N N  Doing errands, shopping? N N  Preparing Food and eating ? N -  Using the Toilet? N -  In the past six months, have you accidently leaked urine? N -  Some recent data might be hidden    Patient  Care Team: Marin Olp, MD as PCP - General (Family Medicine)  Indicate any recent Medical Services you may have received from other than [facility] providers in the past year (date may be approximate).     Assessment:   This is a routine wellness examination for [place].  Hearing/Vision screen Hearing Screening - Comments:: Pt denies any hearing issues  Vision Screening - Comments:: Pt follows up with Dr Diona Foley for annual eye exams   Dietary issues and exercise activities discussed: Current Exercise Habits: The patient does not participate in regular exercise at present   Goals Addressed   None    Depression Screen PHQ 2/9 Scores 09/08/2021 07/13/2021 07/13/2021 03/03/2021 08/31/2020 02/24/2020 08/18/2019  PHQ - 2 Score 1 4 2  0 0 0 0  PHQ- 9 Score 2 13 - - - 0 -    Fall Risk Fall Risk  09/08/2021 07/13/2021 03/03/2021 02/24/2020 12/12/2017  Falls in the past year? 0 0 0 0 No  Number falls in past yr: 0 0 0 0 -  Injury with Fall? 0 0 0 0 -  Risk for fall due to : Impaired vision - No Fall Risks - -  Follow up Falls prevention discussed - Falls evaluation completed - -    FALL RISK PREVENTION PERTAINING TO THE HOME:  Any stairs in or around the home? Yes  If so, are there any without handrails? No  Home free of loose throw rugs in walkways, pet beds, electrical cords, etc? Yes  Adequate lighting in your home to reduce risk of falls?  Yes   ASSISTIVE DEVICES UTILIZED TO PREVENT FALLS:  Life alert? Yes  Use of a cane, walker or w/c? No  Grab bars in the bathroom? Yes  Shower chair or bench in shower? Yes  Elevated toilet seat or a handicapped toilet? Yes   TIMED UP AND GO:  Was the test performed? No .   Cognitive Function: Declined  MMSE - Mini Mental State Exam 12/12/2017 10/26/2016  Not completed: (No Data) (No Data)     6CIT Screen 09/08/2021  What Year? 0 points  What month? 0 points  What time? 0 points  Count back from 20 0 points  Months in reverse 0 points  Repeat  phrase 4 points  Total Score 4    Immunizations Immunization History  Administered Date(s) Administered   Fluad Quad(high Dose 65+) 04/29/2020   Influenza Split 05/23/2011, 05/23/2012, 05/04/2013, 05/31/2014   Influenza Whole 09/11/2009, 05/08/2010   Influenza, High Dose Seasonal PF 05/02/2016, 05/08/2017, 04/25/2019   Influenza-Unspecified 05/25/2015, 04/08/2018, 04/24/2021   Moderna Sars-Covid-2 Vaccination 08/19/2019, 09/16/2019, 06/06/2020   Pneumococcal Conjugate-13 05/27/2020   Pneumococcal Polysaccharide-23 05/17/2010   Tdap 11/21/2011    TDAP status: Up to date  Flu Vaccine status: Up to date  Pneumococcal vaccine status: Up to date  Covid-19 vaccine status: Completed vaccines  Qualifies for Shingles Vaccine? Yes   Zostavax completed No   Shingrix Completed?: No.    Education has been provided regarding the importance of this vaccine. Patient has been advised to call insurance company to determine out of pocket expense if they have not yet received this vaccine. Advised may also receive vaccine at local pharmacy or Health Dept. Verbalized acceptance and understanding.  Screening Tests Health Maintenance  Topic Date Due   Zoster Vaccines- Shingrix (1 of 2) Never done   COVID-19 Vaccine (4 - Booster for Moderna series) 08/01/2020   TETANUS/TDAP  11/20/2021   COLONOSCOPY (Pts 45-65yrs Insurance coverage will need to be confirmed)  02/18/2022   Pneumonia Vaccine 33+ Years old  Completed   INFLUENZA VACCINE  Completed   DEXA SCAN  Completed   HPV VACCINES  Aged Out    Health Maintenance  Health Maintenance Due  Topic Date Due   Zoster Vaccines- Shingrix (1 of 2) Never done   COVID-19 Vaccine (4 - Booster for Moderna series) 08/01/2020    Colorectal cancer screening: Type of screening: Colonoscopy. Completed 02/19/19. Repeat every 3 years  Mammogram status: No longer required due to age.  Bone Density status: Completed 12/23/19. Results reflect: Bone density  results: OSTEOPOROSIS. Repeat every 2 years.   Additional Screening:  Vision Screening: Recommended annual ophthalmology exams for early detection of glaucoma and other disorders of the eye. Is the patient up to date with their annual eye exam?  Yes  Who is the provider or what is the name of the office in which the patient attends annual eye exams? Dr Diona Foley  If pt is not established with a provider, would they like to be referred to a provider to establish care? No .   Dental Screening: Recommended annual dental exams for proper oral hygiene  Community Resource Referral / Chronic Care Management: CRR required this visit?  No   CCM required this visit?  No      Plan:     I have personally reviewed and noted the following in the patients chart:   Medical and social history Use of alcohol, tobacco or illicit drugs  Current medications and supplements including opioid prescriptions.  Functional ability and status Nutritional status Physical activity Advanced directives List of other physicians Hospitalizations, surgeries, and ER visits in previous 12 months Vitals Screenings to include cognitive, depression, and falls Referrals and appointments  In addition, I have reviewed and discussed with patient certain preventive protocols, quality metrics, and best practice recommendations. A written personalized care plan for preventive services as well as general preventive health recommendations were provided to patient.     Willette Brace, LPN   [id number]/[id number]   Nurse Notes: None

## 2021-09-15 ENCOUNTER — Other Ambulatory Visit: Payer: Self-pay

## 2021-09-15 ENCOUNTER — Ambulatory Visit (INDEPENDENT_AMBULATORY_CARE_PROVIDER_SITE_OTHER): Payer: Medicare Other | Admitting: Family Medicine

## 2021-09-15 ENCOUNTER — Encounter: Payer: Self-pay | Admitting: Family Medicine

## 2021-09-15 VITALS — BP 110/64 | HR 76 | Temp 98.0°F | Ht 68.0 in | Wt 165.8 lb

## 2021-09-15 DIAGNOSIS — I10 Essential (primary) hypertension: Secondary | ICD-10-CM | POA: Diagnosis not present

## 2021-09-15 DIAGNOSIS — R4589 Other symptoms and signs involving emotional state: Secondary | ICD-10-CM | POA: Diagnosis not present

## 2021-09-15 DIAGNOSIS — Z636 Dependent relative needing care at home: Secondary | ICD-10-CM | POA: Diagnosis not present

## 2021-09-15 DIAGNOSIS — F411 Generalized anxiety disorder: Secondary | ICD-10-CM | POA: Diagnosis not present

## 2021-09-15 MED ORDER — SERTRALINE HCL 25 MG PO TABS: 12.5000 mg | ORAL_TABLET | Freq: Every day | ORAL | 5 refills | Status: DC

## 2021-09-15 NOTE — Patient Instructions (Addendum)
Lets decrease sertraline to 12.5 mg and update me in 2-3 weeks with your side effect profile. Reasonable to get eyes checked during that time.  - I may even trial you off  completely for a period if side effects dont resolve on half dose- and if they persist off medicine may need further workup particularly with headaches - specifically if ear ringing symptom does not improve within a week let me know and you agreed to pursue imaging   Trial off fish oil for 2-3 weeks during this trial and see if makes any difference on nausea  Recommended follow up: Return in about 6 weeks (around 10/27/2021) for follow up- or sooner if needed. - if worsening symptoms let me know immediately
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
## 2021-10-02 ENCOUNTER — Telehealth: Payer: Medicare Other | Admitting: Physician Assistant

## 2021-10-02 DIAGNOSIS — R000 Tachycardia, unspecified: Secondary | ICD-10-CM | POA: Diagnosis not present

## 2021-10-02 MED ORDER — METOPROLOL TARTRATE 25 MG PO TABS: 25.0000 mg | ORAL_TABLET | Freq: Every day | ORAL | 0 refills | Status: DC

## 2021-10-02 NOTE — Progress Notes (Signed)
?Virtual Visit Consent  ? ?[facility], you are scheduled for a virtual visit with a [facility] provider today.   ?  ?Just as with appointments in the office, your consent must be obtained to participate.  Your consent will be active for this visit and any virtual visit you may have with one of our providers in the next 365 days.   ?  ?If you have a MyChart account, a copy of this consent can be sent to you electronically.  All virtual visits are billed to your insurance company just like a traditional visit in the office.   ? ?As this is a virtual visit, video technology does not allow for your provider to perform a traditional examination.  This may limit your provider's ability to fully assess your condition.  If your provider identifies any concerns that need to be evaluated in person or the need to arrange testing (such as labs, EKG, etc.), we will make arrangements to do so.   ?  ?Although advances in technology are sophisticated, we cannot ensure that it will always work on either your end or our end.  If the connection with a video visit is poor, the visit may have to be switched to a telephone visit.  With either a video or telephone visit, we are not always able to ensure that we have a secure connection.    ? ?I need to obtain your verbal consent now.   Are you willing to proceed with your visit today?  ?  ?Laura [name] has provided verbal consent on 10/02/2021 for a virtual visit (video or telephone). ?  ?Mar Daring, PA-C  ? ?Date: 10/02/2021 8:03 PM ? ? ?Virtual Visit via Video Note  ? ?Laura [name], connected with  Laura [name]  ([id number], 07/25/1940) on 10/02/21 at  7:15 PM EDT by a video-enabled telemedicine application and verified that I am speaking with the correct person using two identifiers. Daughter is present as well.  ? ?Location: ?Patient: Virtual Visit Location Patient: Home ?Provider: Virtual Visit Location Provider: Home Office ?  ?I discussed the  limitations of evaluation and management by telemedicine and the availability of in person appointments. The patient expressed understanding and agreed to proceed.   ? ?History of Present Illness: ?Laura [name] is a 81 y.o. who identifies as a female who was assigned female at birth, and is being seen today for tachycardia. Noticed increased heart rate and palpitations this afternoon around 3pm. Reports she normally does not have this sensation, even with her anxiety. She reports highest HR was 170. Daughter reports highest HR she as seen was 140s. Icess denies any chest pain, chest pressure, nausea, vomiting, diaphoresis, dizziness, or shortness of breath. There is strong family history of arrhythmias (atrial fibrillation specifically). Recent vitals today are as follows: ?109/85 130/95 111/92 (most recent) ?HR 140 - 65  ?O2 94-96%  ? ?Cha2ds2vasc score 5 ?HAS-BLED score 1 ? ?Problems:  ?Patient Active Problem List  ? Diagnosis Date Noted  ? Posterior capsular opacification non visually significant of left eye 07/01/2018  ? Posterior capsular opacification of right eye, obscuring vision 07/01/2018  ? Conjunctivochalasis, bilateral 05/27/2018  ? Vitreous syneresis, bilateral 05/27/2018  ? Lamellar macular hole of right eye 02/19/2018  ? Essential hypertension 06/04/2017  ? Hyperlipidemia 10/18/2016  ? History of basal cell cancer 10/18/2016  ? Steroid-induced hyperglycemia 10/18/2016  ? Female bladder prolapse 03/22/2015  ? Osteoporosis 12/06/2014  ? Myopia with astigmatism and  presbyopia, bilateral 09/07/2014  ? Dyspnea 08/20/2013  ? Compression fx, lumbar spine ([place]) 09/24/2012  ? Intermediate uveitis 07/03/2012  ? Steroid responders 07/03/2012  ? Sarcoidosis 11/21/2011  ? GERD with stricture 10/12/2011  ? Conjunctivochalasis 05/17/2011  ? Dermatochalasis 05/17/2011  ? Epiretinal membrane (ERM) of both eyes 05/17/2011  ? Presence of intraocular lens 05/17/2011  ? PVD (posterior vitreous detachment), both eyes  05/17/2011  ? Uveitis 09/21/2007  ? Osteoarthritis 09/21/2007  ? Recurrent UTI 09/09/2007  ? Anxiety state 08/06/2007  ? IBS (irritable bowel syndrome) 08/06/2007  ? LOW BACK PAIN, CHRONIC 08/06/2007  ? History of colonic polyps 08/06/2007  ?  ?Allergies:  ?Allergies  ?Allergen Reactions  ? Nitrofurantoin Nausea Only  ?  Significant and had to stop after 2-3 doses   ? Tape Rash  ?  Blisters skin and removes skin --NEEDS PAPER TAPE  ? ?Medications:  ?Current Outpatient Medications:  ?  metoprolol tartrate (LOPRESSOR) 25 MG tablet, Take 1 tablet (25 mg total) by mouth at bedtime., Disp: 30 tablet, Rfl: 0 ?  amLODipine (NORVASC) 5 MG tablet, TAKE 1 TABLET(5 MG) BY MOUTH DAILY, Disp: 90 tablet, Rfl: 3 ?  B Complex Vitamins (VITAMIN-B COMPLEX PO), Take 1 capsule by mouth daily., Disp: , Rfl:  ?  Calcium Carbonate (CALTRATE 600 PO), Take 1 tablet by mouth 2 (two) times daily., Disp: , Rfl:  ?  cholecalciferol (VITAMIN D) 1000 units tablet, Take 1,000 Units by mouth daily., Disp: , Rfl:  ?  esomeprazole (NEXIUM) 40 MG capsule, TAKE ONE CAPSULE BY MOUTH TWICE DAILY BEFORE A MEAL (Patient taking differently: 20 mg daily.), Disp: 180 capsule, Rfl: 1 ?  Glucosamine HCl (GLUCOSAMINE PO), Liquid form - Take by mouth as directed once daily, Disp: , Rfl:  ?  meclizine (ANTIVERT) 25 MG tablet, Take 1/2-1 tablet by mouth every 4 hours as needed for dizziness, Disp: , Rfl:  ?  metroNIDAZOLE (METROGEL) 0.75 % gel, Apply topically 2 (two) times daily., Disp: , Rfl:  ?  Multiple Vitamins-Minerals (MULTIVITAL) tablet, Take 1 tablet by mouth daily. 2- 3 times a week, Disp: , Rfl:  ?  Omega-3 Fatty Acids (FISH OIL) 1000 MG CAPS, Take 1 capsule by mouth daily., Disp: , Rfl:  ?  ondansetron (ZOFRAN-ODT) 4 MG disintegrating tablet, Take 1 tablet (4 mg total) by mouth every 8 (eight) hours as needed for nausea (with vertigo)., Disp: 30 tablet, Rfl: 1 ?  predniSONE (DELTASONE) 5 MG tablet, TAKE 1 TABLET BY MOUTH DAILY WITH BREAKFAST, Disp: 90  tablet, Rfl: 3 ?  Probiotic Product (ALIGN PO), Take by mouth daily., Disp: , Rfl:  ?  sertraline (ZOLOFT) 25 MG tablet, Take 0.5 tablets (12.5 mg total) by mouth daily., Disp: 15 tablet, Rfl: 5 ?  simvastatin (ZOCOR) 20 MG tablet, TAKE 1 TABLET(20 MG) BY MOUTH AT BEDTIME, Disp: 90 tablet, Rfl: 3 ?  sucralfate (CARAFATE) 1 g tablet, TAKE 1 TABLET(1 GRAM) BY MOUTH TWICE DAILY AS NEEDED, Disp: 180 tablet, Rfl: 3 ?  tacrolimus (PROTOPIC) 0.1 % ointment, Apply topically 2 (two) times daily., Disp: , Rfl:  ? ?Observations/Objective: ?Patient is well-developed, well-nourished in no acute distress.  ?Resting comfortably at home.  ?Head is normocephalic, atraumatic.  ?No labored breathing.  ?Speech is clear and coherent with logical content.  ?Patient is alert and oriented at baseline.  ? ? ?Assessment and Plan: ?1. Tachycardia ?- metoprolol tartrate (LOPRESSOR) 25 MG tablet; Take 1 tablet (25 mg total) by mouth at bedtime.  Dispense: 30 tablet;  Refill: 0 ? ?- Symptoms suspicious for atrial fibrillation ?- Will start low dose Metoprolol for heart rate control  ?- ASA '81mg'$  to be started for now ?- Advised to call PCP tomorrow for appt to have EKG ?- Discussed strict ER precautions with daughter and patient; both voiced understanding ? ?Follow Up Instructions: ?I discussed the assessment and treatment plan with the patient. The patient was provided an opportunity to ask questions and all were answered. The patient agreed with the plan and demonstrated an understanding of the instructions.  A copy of instructions were sent to the patient via MyChart unless otherwise noted below.  ? ? ?The patient was advised to call back or seek an in-person evaluation if the symptoms worsen or if the condition fails to improve as anticipated. ? ?Time:  ?I spent 18 minutes with the patient via telehealth technology discussing the above problems/concerns.   ? ?Mar Daring, PA-C ?

## 2021-10-02 NOTE — Patient Instructions (Signed)
[employer], thank you for joining Mar Daring, PA-C for today's virtual visit.  While this provider is not your primary care provider (PCP), if your PCP is located in our provider database this encounter information will be shared with them immediately following your visit.  Consent: (Patient) Laura [name] provided verbal consent for this virtual visit at the beginning of the encounter.  Current Medications:  Current Outpatient Medications:    metoprolol tartrate (LOPRESSOR) 25 MG tablet, Take 1 tablet (25 mg total) by mouth at bedtime., Disp: 30 tablet, Rfl: 0   amLODipine (NORVASC) 5 MG tablet, TAKE 1 TABLET(5 MG) BY MOUTH DAILY, Disp: 90 tablet, Rfl: 3   B Complex Vitamins (VITAMIN-B COMPLEX PO), Take 1 capsule by mouth daily., Disp: , Rfl:    Calcium Carbonate (CALTRATE 600 PO), Take 1 tablet by mouth 2 (two) times daily., Disp: , Rfl:    cholecalciferol (VITAMIN D) 1000 units tablet, Take 1,000 Units by mouth daily., Disp: , Rfl:    esomeprazole (NEXIUM) 40 MG capsule, TAKE ONE CAPSULE BY MOUTH TWICE DAILY BEFORE A MEAL (Patient taking differently: 20 mg daily.), Disp: 180 capsule, Rfl: 1   Glucosamine HCl (GLUCOSAMINE PO), Liquid form - Take by mouth as directed once daily, Disp: , Rfl:    meclizine (ANTIVERT) 25 MG tablet, Take 1/2-1 tablet by mouth every 4 hours as needed for dizziness, Disp: , Rfl:    metroNIDAZOLE (METROGEL) 0.75 % gel, Apply topically 2 (two) times daily., Disp: , Rfl:    Multiple Vitamins-Minerals (MULTIVITAL) tablet, Take 1 tablet by mouth daily. 2- 3 times a week, Disp: , Rfl:    Omega-3 Fatty Acids (FISH OIL) 1000 MG CAPS, Take 1 capsule by mouth daily., Disp: , Rfl:    ondansetron (ZOFRAN-ODT) 4 MG disintegrating tablet, Take 1 tablet (4 mg total) by mouth every 8 (eight) hours as needed for nausea (with vertigo)., Disp: 30 tablet, Rfl: 1   predniSONE (DELTASONE) 5 MG tablet, TAKE 1 TABLET BY MOUTH DAILY WITH BREAKFAST, Disp: 90 tablet,  Rfl: 3   Probiotic Product (ALIGN PO), Take by mouth daily., Disp: , Rfl:    sertraline (ZOLOFT) 25 MG tablet, Take 0.5 tablets (12.5 mg total) by mouth daily., Disp: 15 tablet, Rfl: 5   simvastatin (ZOCOR) 20 MG tablet, TAKE 1 TABLET(20 MG) BY MOUTH AT BEDTIME, Disp: 90 tablet, Rfl: 3   sucralfate (CARAFATE) 1 g tablet, TAKE 1 TABLET(1 GRAM) BY MOUTH TWICE DAILY AS NEEDED, Disp: 180 tablet, Rfl: 3   tacrolimus (PROTOPIC) 0.1 % ointment, Apply topically 2 (two) times daily., Disp: , Rfl:    Medications ordered in this encounter:  Meds ordered this encounter  Medications   metoprolol tartrate (LOPRESSOR) 25 MG tablet    Sig: Take 1 tablet (25 mg total) by mouth at bedtime.    Dispense:  30 tablet    Refill:  0    Order Specific Question:   Supervising Provider    Answer:   Sabra Heck, BRIAN [[id number]]     *If you need refills on other medications prior to your next appointment, please contact your pharmacy*  Follow-Up: Call back or seek an in-person evaluation if the symptoms worsen or if the condition fails to improve as anticipated.  Other Instructions  Sinus Tachycardia Sinus tachycardia is a kind of fast heartbeat. In sinus tachycardia, the heart beats more than 100 times a minute. Sinus tachycardia starts in a part of the heart called the sinus node. Sinus tachycardia may be  harmless, or it may be a sign of a serious condition. What are the causes? This condition may be caused by: Exercise or exertion. A fever. Pain. Loss of body fluids (dehydration). Severe bleeding (hemorrhage). Anxiety and stress. Certain substances, including: Alcohol. Caffeine. Tobacco and nicotine products. Cold medicines. Illegal drugs. Medical conditions including: Heart disease. An infection. An overactive thyroid (hyperthyroidism). A lack of red blood cells (anemia). What are the signs or symptoms? Symptoms of this condition include: A feeling that the heart is beating quickly  (palpitations). Suddenly noticing your heartbeat (cardiac awareness). Dizziness. Tiredness (fatigue). Shortness of breath. Chest pain. Nausea. Fainting. How is this diagnosed? This condition is diagnosed with: A physical exam. Other tests, such as: Blood tests. An electrocardiogram (ECG). This test measures the electrical activity of the heart. Ambulatory cardiac monitor. This records your heartbeats for 24 hours or more. You may be referred to a heart specialist (cardiologist). How is this treated? Treatment for this condition depends on the cause or the underlying condition. Treatment may involve: Treating the underlying condition. Taking new medicines or changing your current medicines as told by your health care provider. Making changes to your diet or lifestyle. Follow these instructions at home: Lifestyle  Do not use any products that contain nicotine or tobacco, such as cigarettes and e-cigarettes. If you need help quitting, ask your health care provider. Do not use illegal drugs, such as cocaine. Learn relaxation methods to help you when you get stressed or anxious. These include deep breathing. Avoid caffeine or other stimulants. Alcohol use  Do not drink alcohol if: Your health care provider tells you not to drink. You are pregnant, may be pregnant, or are planning to become pregnant. If you drink alcohol, limit how much you have: 0-1 drink a day for women. 0-2 drinks a day for men. Be aware of how much alcohol is in your drink. In the [place] one drink equals one typical bottle of beer (12 oz), one-half glass of wine (5 oz), or one shot of hard liquor (1 oz). General instructions Drink enough fluids to keep your urine pale yellow. Take over-the-counter and prescription medicines only as told by your health care provider. Keep all follow-up visits as told by your health care provider. This is important. Contact a health care provider if you have: A fever. Vomiting  or diarrhea that does not go away. Get help right away if you: Have pain in your chest, upper arms, jaw, or neck. Become weak or dizzy. Feel faint. Have palpitations that do not go away. Summary In sinus tachycardia, the heart beats more than 100 times a minute. Sinus tachycardia may be harmless, or it may be a sign of a serious condition. Treatment for this condition depends on the cause or the underlying condition. Get help right away if you have pain in your chest, upper arms, jaw, or neck. This information is not intended to replace advice given to you by your health care provider. Make sure you discuss any questions you have with your health care provider. Document Revised: 11/17/2020 Document Reviewed: 11/17/2020 Elsevier Patient Education  2022 [employer].   Atrial Fibrillation Atrial fibrillation is a type of heartbeat that is irregular or fast. If you have this condition, your heart beats without any order. This makes it hard for your heart to pump blood in a normal way. Atrial fibrillation may come and go, or it may become a long-lasting problem. If this condition is not treated, it can put you at higher  risk for stroke, heart failure, and other heart problems. What are the causes? This condition may be caused by diseases that damage the heart. They include: High blood pressure. Heart failure. Heart valve disease. Heart surgery. Other causes include: Diabetes. Thyroid disease. Being overweight. Kidney disease. Sometimes the cause is not known. What increases the risk? You are more likely to develop this condition if: You are older. You smoke. You exercise often and very hard. You have a family history of this condition. You are a man. You use drugs. You drink a lot of alcohol. You have lung conditions, such as emphysema, pneumonia, or COPD. You have sleep apnea. What are the signs or symptoms? Common symptoms of this condition include: A feeling that your  heart is beating very fast. Chest pain or discomfort. Feeling short of breath. Suddenly feeling light-headed or weak. Getting tired easily during activity. Fainting. Sweating. In some cases, there are no symptoms. How is this treated? Treatment for this condition depends on underlying conditions and how you feel when you have atrial fibrillation. They include: Medicines to: Prevent blood clots. Treat heart rate or heart rhythm problems. Using devices, such as a pacemaker, to correct heart rhythm problems. Doing surgery to remove the part of the heart that sends bad signals. Closing an area where clots can form in the heart (left atrial appendage). In some cases, your doctor will treat other underlying conditions. Follow these instructions at home: Medicines Take over-the-counter and prescription medicines only as told by your doctor. Do not take any new medicines without first talking to your doctor. If you are taking blood thinners: Talk with your doctor before you take any medicines that have aspirin or NSAIDs, such as ibuprofen, in them. Take your medicine exactly as told by your doctor. Take it at the same time each day. Avoid activities that could hurt or bruise you. Follow instructions about how to prevent falls. Wear a bracelet that says you are taking blood thinners. Or, carry a card that lists what medicines you take. Lifestyle   Do not use any products that have nicotine or tobacco in them. These include cigarettes, e-cigarettes, and chewing tobacco. If you need help quitting, ask your doctor. Eat heart-healthy foods. Talk with your doctor about the right eating plan for you. Exercise regularly as told by your doctor. Do not drink alcohol. Lose weight if you are overweight. Do not use drugs, including cannabis. General instructions If you have a condition that causes breathing to stop for a short period of time (apnea), treat it as told by your doctor. Keep a healthy  weight. Do not use diet pills unless your doctor says they are safe for you. Diet pills may make heart problems worse. Keep all follow-up visits as told by your doctor. This is important. Contact a doctor if: You notice a change in the speed, rhythm, or strength of your heartbeat. You are taking a blood-thinning medicine and you get more bruising. You get tired more easily when you move or exercise. You have a sudden change in weight. Get help right away if:  You have pain in your chest or your belly (abdomen). You have trouble breathing. You have side effects of blood thinners, such as blood in your vomit, poop (stool), or pee (urine), or bleeding that cannot stop. You have any signs of a stroke. "BE FAST" is an easy way to remember the main warning signs: B - Balance. Signs are dizziness, sudden trouble walking, or loss of balance. E -  Eyes. Signs are trouble seeing or a change in how you see. F - Face. Signs are sudden weakness or loss of feeling in the face, or the face or eyelid drooping on one side. A - Arms. Signs are weakness or loss of feeling in an arm. This happens suddenly and usually on one side of the body. S - Speech. Signs are sudden trouble speaking, slurred speech, or trouble understanding what people say. T - Time. Time to call emergency services. Write down what time symptoms started. You have other signs of a stroke, such as: A sudden, very bad headache with no known cause. Feeling like you may vomit (nausea). Vomiting. A seizure. These symptoms may be an emergency. Do not wait to see if the symptoms will go away. Get medical help right away. Call your local emergency services (911 in the [place] Do not drive yourself to the hospital. Summary Atrial fibrillation is a type of heartbeat that is irregular or fast. You are at higher risk of this condition if you smoke, are older, have diabetes, or are overweight. Follow your doctor's instructions about medicines, diet,  exercise, and follow-up visits. Get help right away if you have signs or symptoms of a stroke. Get help right away if you cannot catch your breath, or you have chest pain or discomfort. This information is not intended to replace advice given to you by your health care provider. Make sure you discuss any questions you have with your health care provider. Document Revised: 12/31/2018 Document Reviewed: 12/31/2018 Elsevier Patient Education  2022 [employer].    If you have been instructed to have an in-person evaluation today at a local Urgent Care facility, please use the link below. It will take you to a list of all of our available [facility], including address, phone number and hours of operation. Please do not delay care.  [facility]  If you or a family member do not have a primary care provider, use the link below to schedule a visit and establish care. When you choose a [facility] primary care physician or advanced practice provider, you gain a long-term partner in health. Find a Primary Care Provider  Learn more about [facility] in-office and virtual care options: [facility]

## 2021-10-03 ENCOUNTER — Encounter: Payer: Self-pay | Admitting: Family Medicine

## 2021-10-03 ENCOUNTER — Ambulatory Visit (INDEPENDENT_AMBULATORY_CARE_PROVIDER_SITE_OTHER): Payer: Medicare Other | Admitting: Family Medicine

## 2021-10-03 VITALS — BP 150/60 | HR 71 | Temp 98.1°F | Ht 68.0 in | Wt 165.5 lb

## 2021-10-03 DIAGNOSIS — R002 Palpitations: Secondary | ICD-10-CM

## 2021-10-03 LAB — COMPREHENSIVE METABOLIC PANEL
ALT: 12 U/L (ref 0–35)
AST: 17 U/L (ref 0–37)
Albumin: 4.2 g/dL (ref 3.5–5.2)
Alkaline Phosphatase: 65 U/L (ref 39–117)
BUN: 19 mg/dL (ref 6–23)
CO2: 26 mEq/L (ref 19–32)
Calcium: 9.4 mg/dL (ref 8.4–10.5)
Chloride: 104 mEq/L (ref 96–112)
Creatinine, Ser: 0.75 mg/dL (ref 0.40–1.20)
GFR: 74.8 mL/min (ref >60.00)
Glucose, Bld: 108 mg/dL — ABNORMAL HIGH (ref 70–99)
Potassium: 3.8 mEq/L (ref 3.5–5.1)
Sodium: 140 mEq/L (ref 135–145)
Total Bilirubin: 0.9 mg/dL (ref 0.2–1.2)
Total Protein: 6.8 g/dL (ref 6.0–8.3)

## 2021-10-03 LAB — MAGNESIUM: Magnesium: 2.2 mg/dL (ref 1.5–2.5)

## 2021-10-03 LAB — CBC WITH DIFFERENTIAL/PLATELET
Basophils Absolute: 0 10*3/uL (ref 0.0–0.1)
Basophils Relative: 0.4 % (ref 0.0–3.0)
Eosinophils Absolute: 0 10*3/uL (ref 0.0–0.7)
Eosinophils Relative: 0.3 % (ref 0.0–5.0)
HCT: 44.6 % (ref 36.0–46.0)
Hemoglobin: 14.9 g/dL (ref 12.0–15.0)
Lymphocytes Relative: 11.5 % — ABNORMAL LOW (ref 12.0–46.0)
Lymphs Abs: 1 10*3/uL (ref 0.7–4.0)
MCHC: 33.4 g/dL (ref 30.0–36.0)
MCV: 91.9 fl (ref 78.0–100.0)
Monocytes Absolute: 0.5 10*3/uL (ref 0.1–1.0)
Monocytes Relative: 5 % (ref 3.0–12.0)
Neutro Abs: 7.5 10*3/uL (ref 1.4–7.7)
Neutrophils Relative %: 82.8 % — ABNORMAL HIGH (ref 43.0–77.0)
Platelets: 266 10*3/uL (ref 150.0–400.0)
RBC: 4.85 Mil/uL (ref 3.87–5.11)
RDW: 13.5 % (ref 11.5–15.5)
WBC: 9.1 10*3/uL (ref 4.0–10.5)

## 2021-10-03 LAB — TSH: TSH: 1.11 u[IU]/mL (ref 0.35–5.50)

## 2021-10-03 NOTE — Progress Notes (Signed)
? ?Subjective:  ? ? ? Patient ID: Laura [name], female    DOB: 1940/08/28, 81 y.o.   MRN: [id number] ? ?Chief Complaint  ?Patient presents with  ? Palpitations  ?  Started yesterday while shopping  ? ? ?HPI ?Palpitations.  Did video visit yesterday.  Given metoprolol for elevated HR and advised to get appt here today.  Has happened in past but not this long ?Was shopping when around 3pm yest, felt poss near syncopal/arms heavy, felt heart racing so went back home.  and HR 140-160.  Daughter came over at 49 and did tele call.  At about 1030pm, felt better so didn't take meds.   ?Had eaten breakfast around 10:30(cereal w/nuts/yogurt/berries. Toast w/almond butter). Little caffeine-not yest am.   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    ?Denies CP/dizziness/ha/sob.   Strong fh afib.   Feels ok this am.   HR in 70's today. ?Pt always anxious/nervous-but nothing abnormal. ?HA when HR high at first.  ? ?Personal h/o PAC.  Occ will feel heart "out of rhythm" but usu only lasts few sec.  Yesterday was different and lasted a ot longer.  ? ?.  ?   ? ?Health Maintenance Due  ?Topic Date Due  ? Zoster Vaccines- Shingrix (1 of 2) Never done  ? COVID-19 Vaccine (4 - Booster for Moderna series) 08/01/2020  ? ? ?Past Medical History:  ?Diagnosis Date  ? Adenomatous colon polyp   ? sessile  ? Allergy   ? SEASONAL  ? Anxiety   ? Cancer [facility])   ? Skin cancer  ? Cataract   ? bilateral  ? Colon polyps   ? Diverticulosis of colon (without mention of hemorrhage)   ? DJD (degenerative joint disease)   ? Dysrhythmia   ? pac  ? Esophagitis   ? Essential hypertension 06/04/2017  ? GERD (gastroesophageal reflux disease)   ? Hiatal hernia   ? Hypercholesterolemia   ? IBS (irritable bowel syndrome)   ? MVP (mitral valve prolapse)   ? Osteopenia   ? Personal history of colonic polyps   ? SESSILE SERRATED ADENOMAS (X2)  ? PONV (postoperative nausea and vomiting)   ? Sarcoidosis   ? Shortness of breath dyspnea   ? Stricture and  stenosis of esophagus   ? Unspecified gastritis and gastroduodenitis without mention of hemorrhage   ? Urinary tract infection, site not specified   ? Uveitis   ? ? ?Past Surgical History:  ?Procedure Laterality Date  ? BASAL CELL CARCINOMA EXCISION    ? CATARACT EXTRACTION Bilateral   ? COLONOSCOPY  2012  ? MASS EXCISION Left 11/02/2014  ? Procedure: EXCISION MASS LEFT ELBOW;  Surgeon: Daryll Brod, MD;  Location: [facility];  Service: Orthopedics;  Laterality: Left;  ? POLYPECTOMY    ? UPPER GASTROINTESTINAL ENDOSCOPY    ? VESICOVAGINAL FISTULA CLOSURE W/ TAH    ? "sling" after hystrectomy per patient  ? ? ?Outpatient Medications Prior to Visit  ?Medication Sig Dispense Refill  ? amLODipine (NORVASC) 5 MG tablet TAKE 1 TABLET(5 MG) BY MOUTH DAILY 90 tablet 3  ? B Complex Vitamins (VITAMIN-B COMPLEX PO) Take 1 capsule by mouth daily.    ? Calcium Carbonate (CALTRATE 600 PO) Take 1 tablet by mouth 2 (two) times daily.    ? cholecalciferol (VITAMIN D) 1000 units tablet Take 1,000 Units by mouth daily.    ? esomeprazole (NEXIUM) 40 MG capsule TAKE ONE CAPSULE BY MOUTH  TWICE DAILY BEFORE A MEAL (Patient taking differently: 20 mg daily.) 180 capsule 1  ? Glucosamine HCl (GLUCOSAMINE PO) Liquid form - Take by mouth as directed once daily    ? meclizine (ANTIVERT) 25 MG tablet Take 1/2-1 tablet by mouth every 4 hours as needed for dizziness    ? metoprolol tartrate (LOPRESSOR) 25 MG tablet Take 1 tablet (25 mg total) by mouth at bedtime. 30 tablet 0  ? metroNIDAZOLE (METROGEL) 0.75 % gel Apply topically 2 (two) times daily.    ? Multiple Vitamins-Minerals (MULTIVITAL) tablet Take 1 tablet by mouth daily. 2- 3 times a week    ? Omega-3 Fatty Acids (FISH OIL) 1000 MG CAPS Take 1 capsule by mouth daily.    ? ondansetron (ZOFRAN-ODT) 4 MG disintegrating tablet Take 1 tablet (4 mg total) by mouth every 8 (eight) hours as needed for nausea (with vertigo). 30 tablet 1  ? predniSONE (DELTASONE) 5 MG tablet TAKE 1  TABLET BY MOUTH DAILY WITH BREAKFAST 90 tablet 3  ? Probiotic Product (ALIGN PO) Take by mouth daily.    ? sertraline (ZOLOFT) 25 MG tablet Take 0.5 tablets (12.5 mg total) by mouth daily. 15 tablet 5  ? simvastatin (ZOCOR) 20 MG tablet TAKE 1 TABLET(20 MG) BY MOUTH AT BEDTIME 90 tablet 3  ? sucralfate (CARAFATE) 1 g tablet TAKE 1 TABLET(1 GRAM) BY MOUTH TWICE DAILY AS NEEDED 180 tablet 3  ? tacrolimus (PROTOPIC) 0.1 % ointment Apply topically 2 (two) times daily.    ? ?No facility-administered medications prior to visit.  ? ? ?Allergies  ?Allergen Reactions  ? Nitrofurantoin Nausea Only  ?  Significant and had to stop after 2-3 doses   ? Tape Rash  ?  Blisters skin and removes skin --NEEDS PAPER TAPE  ? ?ROS neg/noncontributory except as noted HPI/below ?GI-no heartburn now but was. ?Stress-not tolerant zoloft '25mg'$ .  On 1/2 tab now.   ? ? ?   ?Objective:  ?  ? ?BP (!) 150/60   Pulse 71   Temp 98.1 ?F (36.7 ?C) (Temporal)   Ht '5\' 8"'$  (1.727 m)   Wt 165 lb 8 oz (75.1 kg)   SpO2 98%   BMI 25.16 kg/m?  ?Wt Readings from Last 3 Encounters:  ?10/03/21 165 lb 8 oz (75.1 kg)  ?09/15/21 165 lb 12.8 oz (75.2 kg)  ?09/08/21 165 lb 3.2 oz (74.9 kg)  ? ? ?Physical Exam  ? ?Gen: WDWN NAD older WF ?HEENT: NCAT, conjunctiva not injected, sclera nonicteric ?NECK:  supple, no thyromegaly, no nodes, no carotid bruits ?CARDIAC: RRR, S1S2+,?1/6 murmur at RSB.  1 ectopic beat.  ?LUNGS: CTAB. No wheezes ?ABDOMEN:  BS+, soft, NTND, No HSM, no masses ?EXT:  no edema ?MSK: no gross abnormalities.  ?NEURO: A&O x3.  CN II-XII intact.  ?PSYCH: normal mood. Good eye contact ? ?EKG: RRR.  No st/t changes.  normal ? ?   ?Assessment & Plan:  ? ?Problem List Items Addressed This Visit   ?None ?Visit Diagnoses   ? ? Palpitation    -  Primary  ? Relevant Orders  ? EKG 12-Lead  ? ?  ? Palpitations-resolved.  EKG ok.  Will check CBC,cmp,mg,TSH.  Event monitor.  Carry metoprolol in case recurs. ? ?No orders of the defined types were placed in this  encounter. ? ? ?Wellington Hampshire, MD ? ?

## 2021-10-03 NOTE — Patient Instructions (Addendum)
Keep metoprolol on hand in case recurs.  Limit caffeine. Make sure eating and drinking.  Let us know if recurs. ? ?Consider Kardia device ?
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
## 2021-10-19 ENCOUNTER — Ambulatory Visit: Payer: Medicare Other | Admitting: Family Medicine

## 2021-10-19 DIAGNOSIS — E785 Hyperlipidemia, unspecified: Secondary | ICD-10-CM

## 2021-10-19 DIAGNOSIS — F411 Generalized anxiety disorder: Secondary | ICD-10-CM

## 2021-10-19 DIAGNOSIS — I10 Essential (primary) hypertension: Secondary | ICD-10-CM

## 2021-11-14 DIAGNOSIS — L578 Other skin changes due to chronic exposure to nonionizing radiation: Secondary | ICD-10-CM | POA: Diagnosis not present

## 2021-11-14 DIAGNOSIS — L821 Other seborrheic keratosis: Secondary | ICD-10-CM | POA: Diagnosis not present

## 2021-11-14 DIAGNOSIS — L710 Perioral dermatitis: Secondary | ICD-10-CM | POA: Diagnosis not present

## 2021-11-14 DIAGNOSIS — D1801 Hemangioma of skin and subcutaneous tissue: Secondary | ICD-10-CM | POA: Diagnosis not present

## 2021-11-14 DIAGNOSIS — Z08 Encounter for follow-up examination after completed treatment for malignant neoplasm: Secondary | ICD-10-CM | POA: Diagnosis not present

## 2021-11-14 DIAGNOSIS — Z85828 Personal history of other malignant neoplasm of skin: Secondary | ICD-10-CM | POA: Diagnosis not present

## 2021-11-14 DIAGNOSIS — L84 Corns and callosities: Secondary | ICD-10-CM | POA: Diagnosis not present

## 2021-11-14 DIAGNOSIS — Z808 Family history of malignant neoplasm of other organs or systems: Secondary | ICD-10-CM | POA: Diagnosis not present

## 2021-12-04 ENCOUNTER — Ambulatory Visit (INDEPENDENT_AMBULATORY_CARE_PROVIDER_SITE_OTHER): Payer: Medicare Other | Admitting: Family Medicine

## 2021-12-04 ENCOUNTER — Encounter: Payer: Self-pay | Admitting: Family Medicine

## 2021-12-04 VITALS — BP 122/60 | HR 73 | Temp 98.0°F | Ht 68.0 in | Wt 166.2 lb

## 2021-12-04 DIAGNOSIS — F411 Generalized anxiety disorder: Secondary | ICD-10-CM

## 2021-12-04 DIAGNOSIS — R002 Palpitations: Secondary | ICD-10-CM

## 2021-12-04 DIAGNOSIS — I10 Essential (primary) hypertension: Secondary | ICD-10-CM | POA: Diagnosis not present

## 2021-12-04 NOTE — Patient Instructions (Addendum)
Let us know when you have gotten your COVID, TDAP and SHINGRIX vaccine at your pharmacy.  ? ?Please call [phone] to schedule a visit with [facility] ?- please call and see if they will let you pick up paperwork since you never received as next step ? ?Lets trial off sertraline completley for 2 weeks- let me know if reflux does not resolve/improve ? ?Then start (if reflux gone) lexapro/escitalopram 5 mg tablet- take half tablet for first 2 weeks then either update me by phone and continue that dose or we can sit down again at that point if you prefer. I may increase to 5 mg at that point ? ?Recommended follow up: Return in about 2 months (around 02/03/2022) for followup or sooner if needed.Schedule b4 you leave.  Or could do 3 months but I definitely want an update in a month if you are having side effects.  ?
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
## 2021-12-15 ENCOUNTER — Other Ambulatory Visit: Payer: Self-pay | Admitting: Family Medicine

## 2021-12-15 DIAGNOSIS — K219 Gastro-esophageal reflux disease without esophagitis: Secondary | ICD-10-CM

## 2022-01-05 DIAGNOSIS — H04123 Dry eye syndrome of bilateral lacrimal glands: Secondary | ICD-10-CM | POA: Diagnosis not present

## 2022-01-05 DIAGNOSIS — H35371 Puckering of macula, right eye: Secondary | ICD-10-CM | POA: Diagnosis not present

## 2022-01-05 DIAGNOSIS — Z961 Presence of intraocular lens: Secondary | ICD-10-CM | POA: Diagnosis not present

## 2022-01-05 DIAGNOSIS — H40023 Open angle with borderline findings, high risk, bilateral: Secondary | ICD-10-CM | POA: Diagnosis not present

## 2022-01-05 DIAGNOSIS — H20023 Recurrent acute iridocyclitis, bilateral: Secondary | ICD-10-CM | POA: Diagnosis not present

## 2022-01-05 DIAGNOSIS — Z79899 Other long term (current) drug therapy: Secondary | ICD-10-CM | POA: Diagnosis not present

## 2022-01-05 DIAGNOSIS — H2013 Chronic iridocyclitis, bilateral: Secondary | ICD-10-CM | POA: Diagnosis not present

## 2022-01-05 DIAGNOSIS — H43393 Other vitreous opacities, bilateral: Secondary | ICD-10-CM | POA: Diagnosis not present

## 2022-01-05 DIAGNOSIS — H35013 Changes in retinal vascular appearance, bilateral: Secondary | ICD-10-CM | POA: Diagnosis not present

## 2022-01-15 DIAGNOSIS — L710 Perioral dermatitis: Secondary | ICD-10-CM | POA: Diagnosis not present

## 2022-02-09 ENCOUNTER — Ambulatory Visit (INDEPENDENT_AMBULATORY_CARE_PROVIDER_SITE_OTHER): Payer: Medicare Other | Admitting: Family Medicine

## 2022-02-09 ENCOUNTER — Encounter: Payer: Self-pay | Admitting: Family Medicine

## 2022-02-09 VITALS — BP 128/70 | HR 68 | Temp 98.3°F | Resp 16 | Ht 68.0 in | Wt 167.4 lb

## 2022-02-09 DIAGNOSIS — F411 Generalized anxiety disorder: Secondary | ICD-10-CM | POA: Diagnosis not present

## 2022-02-09 DIAGNOSIS — E785 Hyperlipidemia, unspecified: Secondary | ICD-10-CM

## 2022-02-09 DIAGNOSIS — T380X5A Adverse effect of glucocorticoids and synthetic analogues, initial encounter: Secondary | ICD-10-CM | POA: Diagnosis not present

## 2022-02-09 DIAGNOSIS — D869 Sarcoidosis, unspecified: Secondary | ICD-10-CM

## 2022-02-09 DIAGNOSIS — I10 Essential (primary) hypertension: Secondary | ICD-10-CM | POA: Diagnosis not present

## 2022-02-09 DIAGNOSIS — R739 Hyperglycemia, unspecified: Secondary | ICD-10-CM | POA: Diagnosis not present

## 2022-02-09 DIAGNOSIS — Z79899 Other long term (current) drug therapy: Secondary | ICD-10-CM

## 2022-02-09 LAB — COMPREHENSIVE METABOLIC PANEL
ALT: 16 U/L (ref 0–35)
AST: 17 U/L (ref 0–37)
Albumin: 4.2 g/dL (ref 3.5–5.2)
Alkaline Phosphatase: 74 U/L (ref 39–117)
BUN: 16 mg/dL (ref 6–23)
CO2: 27 mEq/L (ref 19–32)
Calcium: 9.5 mg/dL (ref 8.4–10.5)
Chloride: 103 mEq/L (ref 96–112)
Creatinine, Ser: 0.69 mg/dL (ref 0.40–1.20)
GFR: 81.33 mL/min (ref >60.00)
Glucose, Bld: 107 mg/dL — ABNORMAL HIGH (ref 70–99)
Potassium: 3.9 mEq/L (ref 3.5–5.1)
Sodium: 141 mEq/L (ref 135–145)
Total Bilirubin: 0.6 mg/dL (ref 0.2–1.2)
Total Protein: 7.3 g/dL (ref 6.0–8.3)

## 2022-02-09 LAB — CBC WITH DIFFERENTIAL/PLATELET
Basophils Absolute: 0 10*3/uL (ref 0.0–0.1)
Basophils Relative: 0.3 % (ref 0.0–3.0)
Eosinophils Absolute: 0 10*3/uL (ref 0.0–0.7)
Eosinophils Relative: 0.3 % (ref 0.0–5.0)
HCT: 41.9 % (ref 36.0–46.0)
Hemoglobin: 14 g/dL (ref 12.0–15.0)
Lymphocytes Relative: 12.9 % (ref 12.0–46.0)
Lymphs Abs: 1.2 10*3/uL (ref 0.7–4.0)
MCHC: 33.4 g/dL (ref 30.0–36.0)
MCV: 92.2 fl (ref 78.0–100.0)
Monocytes Absolute: 0.4 10*3/uL (ref 0.1–1.0)
Monocytes Relative: 4.1 % (ref 3.0–12.0)
Neutro Abs: 7.4 10*3/uL (ref 1.4–7.7)
Neutrophils Relative %: 82.4 % — ABNORMAL HIGH (ref 43.0–77.0)
Platelets: 336 10*3/uL (ref 150.0–400.0)
RBC: 4.54 Mil/uL (ref 3.87–5.11)
RDW: 13 % (ref 11.5–15.5)
WBC: 9 10*3/uL (ref 4.0–10.5)

## 2022-02-09 LAB — LDL CHOLESTEROL, DIRECT: Direct LDL: 91 mg/dL

## 2022-02-09 LAB — HEMOGLOBIN A1C: Hgb A1c MFr Bld: 6.4 % (ref 4.6–6.5)

## 2022-02-09 LAB — VITAMIN B12: Vitamin B-12: 1041 pg/mL — ABNORMAL HIGH (ref 211–911)

## 2022-02-09 MED ORDER — BUSPIRONE HCL 5 MG PO TABS: 2.5000 mg | ORAL_TABLET | Freq: Two times a day (BID) | ORAL | 2 refills | Status: DC | PRN

## 2022-02-09 NOTE — Patient Instructions (Addendum)
Health Maintenance Due  Topic Date Due   Zoster Vaccines- Shingrix (1 of 2)- consider at pharmacy Never done   COVID-19 Vaccine (4 - Booster for Moderna series)- consider new one when released in the fall- we think october 08/01/2020   TETANUS/TDAP - consider at pharmacy 11/20/2021   -  will trial buspirone 2.5 mg again as that did not have side effects and had mild benefit  Trial exercise twice a week if possible  Please stop by lab before you go If you have mychart- we will send your results within 3 business days of [place] receiving them.  If you do not have mychart- we will call you about results within 5 business days of [place] receiving them.  *please also note that you will see labs on mychart as soon as they post. I will later go in and write notes on them- will say "notes from Dr. Yong Channel"   Recommended follow up: Return in about 4 months (around 06/12/2022) for physical or sooner if needed.Schedule b4 you leave.- may be closer to 6 months on variabity

## 2022-02-09 NOTE — Progress Notes (Signed)
Phone [phone] In person visit   Subjective:   Laura [name] is a 81 y.o. year old very pleasant female patient who presents for/with See problem oriented charting Chief Complaint  Patient presents with   Anxiety    GAD-7 and PHQ-9 given to patient and daughter to complete in room   Hypertension   Past Medical History-  Patient Active Problem List   Diagnosis Date Noted   Steroid-induced hyperglycemia 10/18/2016    Priority: High   Sarcoidosis 11/21/2011    Priority: High   Essential hypertension 06/04/2017    Priority: Medium    Hyperlipidemia 10/18/2016    Priority: Medium    Female bladder prolapse 03/22/2015    Priority: Medium    Osteoporosis 12/06/2014    Priority: Medium    Dyspnea 08/20/2013    Priority: Medium    GERD with stricture 10/12/2011    Priority: Medium    Uveitis 09/21/2007    Priority: Medium    Recurrent UTI 09/09/2007    Priority: Medium    Anxiety state 08/06/2007    Priority: Medium    History of colonic polyps 08/06/2007    Priority: Medium    History of basal cell cancer 10/18/2016    Priority: Low   Compression fx, lumbar spine ([place]) 09/24/2012    Priority: Low   PVD (posterior vitreous detachment), both eyes 05/17/2011    Priority: Low   Osteoarthritis 09/21/2007    Priority: Low   IBS (irritable bowel syndrome) 08/06/2007    Priority: Low   LOW BACK PAIN, CHRONIC 08/06/2007    Priority: Low   Intermediate uveitis 07/03/2012   Epiretinal membrane (ERM) of both eyes 05/17/2011    Medications- reviewed and updated Current Outpatient Medications  Medication Sig Dispense Refill   amLODipine (NORVASC) 5 MG tablet TAKE 1 TABLET(5 MG) BY MOUTH DAILY 90 tablet 3   B Complex Vitamins (VITAMIN-B COMPLEX PO) Take 1 capsule by mouth daily.     busPIRone (BUSPAR) 5 MG tablet Take 0.5-1 tablets (2.5-5 mg total) by mouth 2 (two) times daily as needed. 30 tablet 2   Calcium Carbonate (CALTRATE 600 PO) Take 1 tablet by mouth 2 (two)  times daily.     cholecalciferol (VITAMIN D) 1000 units tablet Take 1,000 Units by mouth daily.     esomeprazole (NEXIUM) 40 MG capsule TAKE ONE CAPSULE BY MOUTH TWICE DAILY BEFORE A MEAL (Patient taking differently: 20 mg daily.) 180 capsule 1   Glucosamine HCl (GLUCOSAMINE PO) Liquid form - Take by mouth as directed once daily     meclizine (ANTIVERT) 25 MG tablet Take 1/2-1 tablet by mouth every 4 hours as needed for dizziness     metoprolol tartrate (LOPRESSOR) 25 MG tablet Take 1 tablet (25 mg total) by mouth at bedtime. 30 tablet 0   Multiple Vitamins-Minerals (MULTIVITAL) tablet Take 1 tablet by mouth daily. 2- 3 times a week     Omega-3 Fatty Acids (FISH OIL) 1000 MG CAPS Take 1 capsule by mouth daily.     ondansetron (ZOFRAN-ODT) 4 MG disintegrating tablet Take 1 tablet (4 mg total) by mouth every 8 (eight) hours as needed for nausea (with vertigo). 30 tablet 1   predniSONE (DELTASONE) 5 MG tablet TAKE 1 TABLET BY MOUTH DAILY WITH BREAKFAST 90 tablet 3   Probiotic Product (ALIGN PO) Take by mouth daily.     simvastatin (ZOCOR) 20 MG tablet TAKE 1 TABLET(20 MG) BY MOUTH AT BEDTIME 90 tablet 3   sucralfate (CARAFATE) 1  g tablet TAKE 1 TABLET(1 GRAM) BY MOUTH TWICE DAILY AS NEEDED 180 tablet 3   tacrolimus (PROTOPIC) 0.1 % ointment Apply topically 2 (two) times daily.     No current facility-administered medications for this visit.     Objective:  BP 128/70   Pulse 68   Temp 98.3 F (36.8 C) (Temporal)   Resp 16   Ht '5\' 8"'$  (1.727 m)   Wt 167 lb 6.4 oz (75.9 kg)   SpO2 96%   BMI 25.45 kg/m  Gen: NAD, resting comfortably CV: RRR no murmurs rubs or gallops Lungs: CTAB no crackles, wheeze, rhonchi Ext: minimal edema Skin: warm, dry    Assessment and Plan   #sarcoidosis- working with Dr. Claiborne Billings in [place] with [facility]- remains on prednisone. Seen 09/14/21 and told in remission. Also issues with uveitis in the past- has been stable. Apparently updated PFTs  #  Anxiety S:Medication: none  -Felt sedated with buspirone 5 mg.  Tolerated 3.5 mg 4 times a day -Trialed sertraline 25 mg but had headaches, hot flashes, stomach issues/reflux despite PPI -In May 2023 trial of Lexapro 5 mg-half dose to start  Counseling: Has been seeing a therapist through her church but they stopped practicing Paperwork from [facility] not received- they finally got paperwork but scheduling hasnt worked  -Onset of anxiety around the time of significant accident November 2022 -Baseline anxiety related to caring for her husband with Parkinson's- sitters 5 days a week and that has been helpful.  A/P: overall improving (PHQ9 and GAD7 of 4- team will input) with more support in caring for husband. She is hesitant to try new medicine and tough for her to consider set up for [place] [facility]- she wants to hold off for now -  will trial buspirone 2.5 mg again as that did not have side effects and had mild benefit -exercise may help  #Steroid-induced hyperglycemia-A1c 6.3 in 2022 S: Medication:Related to long-term prednisone use Exercise and diet- not exercising- encouraged to start at [facility] Lab Results  Component Value Date   HGBA1C 6.3 03/17/2021  A/P: hopefully stable- update a1c today. Continue current meds for now  #hypertension S: medication: amlodipine 5 mg, metoprolol 25 mg-only as needed if has palpitations BP Readings from Last 3 Encounters:  02/09/22 128/70  12/04/21 122/60  10/03/21 (!) 150/60  A/P: Controlled. Continue current medications.    #hyperlipidemia S: Medication:Simvastatin 20 mg Lab Results  Component Value Date   CHOL 152 03/17/2021   HDL 47.10 03/17/2021   LDLCALC 74 03/17/2021   LDLDIRECT 113.0 08/31/2020   TRIG 153.0 (H) 03/17/2021   CHOLHDL 3 03/17/2021  A/P: close to ideal control- likely due full lipid panel in future but check direct LDL toda   # GERD S:Medication:   nexium 40 mg- down to 20 mg, sulcalfate at night B12  levels related to PPI use: consider recheck last 2019 - reports takes b12 though A/P: Controlled. Continue current medications.  - check b12 with long term PPI use  Recommended follow up: Return in about 4 months (around 06/12/2022) for physical or sooner if needed.Schedule b4 you leave. Future Appointments  Date Time Provider [place]  09/20/2022 11:00 AM [facility]   Lab/Order associations:   ICD-10-CM   1. Steroid-induced hyperglycemia  R73.9 HgB A1c   T38.0X5A     2. Hyperlipidemia, unspecified hyperlipidemia type  E78.5 CBC with Differential/Platelet    Comprehensive metabolic panel    LDL cholesterol, direct  3. Essential hypertension  I10     4. Anxiety state  F41.1     5. High risk medication use  Z79.899 Vitamin B12      Meds ordered this encounter  Medications   busPIRone (BUSPAR) 5 MG tablet    Sig: Take 0.5-1 tablets (2.5-5 mg total) by mouth 2 (two) times daily as needed.    Dispense:  30 tablet    Refill:  2    Return precautions advised.  Garret Reddish, MD

## 2022-03-07 DIAGNOSIS — L720 Epidermal cyst: Secondary | ICD-10-CM | POA: Diagnosis not present

## 2022-03-07 DIAGNOSIS — L710 Perioral dermatitis: Secondary | ICD-10-CM | POA: Diagnosis not present

## 2022-03-12 DIAGNOSIS — R300 Dysuria: Secondary | ICD-10-CM | POA: Diagnosis not present

## 2022-03-23 DIAGNOSIS — H35013 Changes in retinal vascular appearance, bilateral: Secondary | ICD-10-CM | POA: Diagnosis not present

## 2022-03-23 DIAGNOSIS — H43393 Other vitreous opacities, bilateral: Secondary | ICD-10-CM | POA: Diagnosis not present

## 2022-03-23 DIAGNOSIS — Z79899 Other long term (current) drug therapy: Secondary | ICD-10-CM | POA: Diagnosis not present

## 2022-03-23 DIAGNOSIS — H20023 Recurrent acute iridocyclitis, bilateral: Secondary | ICD-10-CM | POA: Diagnosis not present

## 2022-03-23 DIAGNOSIS — H2013 Chronic iridocyclitis, bilateral: Secondary | ICD-10-CM | POA: Diagnosis not present

## 2022-03-23 DIAGNOSIS — H5213 Myopia, bilateral: Secondary | ICD-10-CM | POA: Diagnosis not present

## 2022-03-23 DIAGNOSIS — H35371 Puckering of macula, right eye: Secondary | ICD-10-CM | POA: Diagnosis not present

## 2022-03-23 DIAGNOSIS — H04123 Dry eye syndrome of bilateral lacrimal glands: Secondary | ICD-10-CM | POA: Diagnosis not present

## 2022-03-23 DIAGNOSIS — H40023 Open angle with borderline findings, high risk, bilateral: Secondary | ICD-10-CM | POA: Diagnosis not present

## 2022-03-23 DIAGNOSIS — H524 Presbyopia: Secondary | ICD-10-CM | POA: Diagnosis not present

## 2022-04-04 DIAGNOSIS — D860 Sarcoidosis of lung: Secondary | ICD-10-CM | POA: Diagnosis not present

## 2022-04-04 DIAGNOSIS — R918 Other nonspecific abnormal finding of lung field: Secondary | ICD-10-CM | POA: Diagnosis not present

## 2022-04-16 ENCOUNTER — Encounter: Payer: Self-pay | Admitting: *Deleted

## 2022-05-15 ENCOUNTER — Other Ambulatory Visit: Payer: Self-pay | Admitting: Family Medicine

## 2022-06-01 ENCOUNTER — Telehealth: Payer: Medicare Other | Admitting: Emergency Medicine

## 2022-06-01 DIAGNOSIS — N3000 Acute cystitis without hematuria: Secondary | ICD-10-CM

## 2022-06-01 MED ORDER — CEPHALEXIN 500 MG PO CAPS: 500.0000 mg | ORAL_CAPSULE | Freq: Two times a day (BID) | ORAL | 0 refills | Status: AC

## 2022-06-01 NOTE — Patient Instructions (Signed)
[employer], thank you for joining Carvel Getting, NP for today's virtual visit.  While this provider is not your primary care provider (PCP), if your PCP is located in our provider database this encounter information will be shared with them immediately following your visit.   [facility] account gives you access to today's visit and all your visits, tests, and labs performed at [facility] " click here if you don't have a [facility] account or go to [email or web address]  Consent: (Patient) Scharlene Corn Oguinn provided verbal consent for this virtual visit at the beginning of the encounter.  Current Medications:  Current Outpatient Medications:    cephALEXin (KEFLEX) 500 MG capsule, Take 1 capsule (500 mg total) by mouth 2 (two) times daily for 7 days., Disp: 14 capsule, Rfl: 0   amLODipine (NORVASC) 5 MG tablet, TAKE 1 TABLET(5 MG) BY MOUTH DAILY, Disp: 90 tablet, Rfl: 3   B Complex Vitamins (VITAMIN-B COMPLEX PO), Take 1 capsule by mouth daily., Disp: , Rfl:    busPIRone (BUSPAR) 5 MG tablet, Take 0.5-1 tablets (2.5-5 mg total) by mouth 2 (two) times daily as needed., Disp: 30 tablet, Rfl: 2   Calcium Carbonate (CALTRATE 600 PO), Take 1 tablet by mouth 2 (two) times daily., Disp: , Rfl:    cholecalciferol (VITAMIN D) 1000 units tablet, Take 1,000 Units by mouth daily., Disp: , Rfl:    esomeprazole (NEXIUM) 40 MG capsule, TAKE ONE CAPSULE BY MOUTH TWICE DAILY BEFORE A MEAL (Patient taking differently: 20 mg daily.), Disp: 180 capsule, Rfl: 1   Glucosamine HCl (GLUCOSAMINE PO), Liquid form - Take by mouth as directed once daily, Disp: , Rfl:    meclizine (ANTIVERT) 25 MG tablet, Take 1/2-1 tablet by mouth every 4 hours as needed for dizziness, Disp: , Rfl:    metoprolol tartrate (LOPRESSOR) 25 MG tablet, Take 1 tablet (25 mg total) by mouth at bedtime., Disp: 30 tablet, Rfl: 0   Multiple Vitamins-Minerals (MULTIVITAL) tablet, Take 1 tablet by mouth  daily. 2- 3 times a week, Disp: , Rfl:    Omega-3 Fatty Acids (FISH OIL) 1000 MG CAPS, Take 1 capsule by mouth daily., Disp: , Rfl:    ondansetron (ZOFRAN-ODT) 4 MG disintegrating tablet, Take 1 tablet (4 mg total) by mouth every 8 (eight) hours as needed for nausea (with vertigo)., Disp: 30 tablet, Rfl: 1   predniSONE (DELTASONE) 5 MG tablet, TAKE 1 TABLET BY MOUTH DAILY WITH BREAKFAST, Disp: 90 tablet, Rfl: 3   Probiotic Product (ALIGN PO), Take by mouth daily., Disp: , Rfl:    simvastatin (ZOCOR) 20 MG tablet, TAKE 1 TABLET(20 MG) BY MOUTH AT BEDTIME, Disp: 90 tablet, Rfl: 3   sucralfate (CARAFATE) 1 g tablet, TAKE 1 TABLET(1 GRAM) BY MOUTH TWICE DAILY AS NEEDED, Disp: 180 tablet, Rfl: 3   tacrolimus (PROTOPIC) 0.1 % ointment, Apply topically 2 (two) times daily., Disp: , Rfl:    Medications ordered in this encounter:  Meds ordered this encounter  Medications   cephALEXin (KEFLEX) 500 MG capsule    Sig: Take 1 capsule (500 mg total) by mouth 2 (two) times daily for 7 days.    Dispense:  14 capsule    Refill:  0     *If you need refills on other medications prior to your next appointment, please contact your pharmacy*  Follow-Up: Call back or seek an in-person evaluation if the symptoms worsen or if the condition fails to improve as anticipated.  [facility] [phone]  Other Instructions Drink plenty of liquids to stay hydrated and to help flush out the infection.   If you begin feeling significantly worse, or if you do not have improvement in 2 days, please seek in person health care.    If you have been instructed to have an in-person evaluation today at a local Urgent Care facility, please use the link below. It will take you to a list of all of our available [facility], including address, phone number and hours of operation. Please do not delay care.  [facility]  If you or a family member do not have a primary care provider, use  the link below to schedule a visit and establish care. When you choose a [facility] primary care physician or advanced practice provider, you gain a long-term partner in health. Find a Primary Care Provider  Learn more about [facility]'s in-office and virtual care options: [facility]

## 2022-06-01 NOTE — Progress Notes (Signed)
Virtual Visit Consent   Laura [name], you are scheduled for a virtual visit with a [facility] provider today. Just as with appointments in the office, your consent must be obtained to participate. Your consent will be active for this visit and any virtual visit you may have with one of our providers in the next 365 days. If you have a MyChart account, a copy of this consent can be sent to you electronically.  As this is a virtual visit, video technology does not allow for your provider to perform a traditional examination. This may limit your provider's ability to fully assess your condition. If your provider identifies any concerns that need to be evaluated in person or the need to arrange testing (such as labs, EKG, etc.), we will make arrangements to do so. Although advances in technology are sophisticated, we cannot ensure that it will always work on either your end or our end. If the connection with a video visit is poor, the visit may have to be switched to a telephone visit. With either a video or telephone visit, we are not always able to ensure that we have a secure connection.  By engaging in this virtual visit, you consent to the provision of healthcare and authorize for your insurance to be billed (if applicable) for the services provided during this visit. Depending on your insurance coverage, you may receive a charge related to this service.  I need to obtain your verbal consent now. Are you willing to proceed with your visit today? Lanette Ell Karim has provided verbal consent on 06/01/2022 for a virtual visit (video or telephone). Carvel Getting, NP  Date: 06/01/2022 10:48 AM  Virtual Visit via Video Note   I, Carvel Getting, connected with  Shadell Brenn Staggs  ([id number], 1941/06/30) on 06/01/22 at 10:45 AM EST by a video-enabled telemedicine application and verified that I am speaking with the correct person using two identifiers.  Location: Patient: Virtual Visit Location  Patient: Home Provider: Virtual Visit Location Provider: Home Office   I discussed the limitations of evaluation and management by telemedicine and the availability of in person appointments. The patient expressed understanding and agreed to proceed.    History of Present Illness: Laura [name] is a 81 y.o. who identifies as a female who was assigned female at birth, and is being seen today for uti.  She began feeling ill 1-2 days ago. Tested herself for covid and it was negative. Tested herself for UTI and it showed positive. Reports fatigue, dysuria, mild abd pain, mild low back pain, fever 100.59F. No vomiting. Drinking liquids but not eating because doesn't have an appetite.   HPI: HPI  Problems:  Patient Active Problem List   Diagnosis Date Noted   Essential hypertension 06/04/2017   Hyperlipidemia 10/18/2016   History of basal cell cancer 10/18/2016   Steroid-induced hyperglycemia 10/18/2016   Female bladder prolapse 03/22/2015   Osteoporosis 12/06/2014   Dyspnea 08/20/2013   Compression fx, lumbar spine ([place]) 09/24/2012   Intermediate uveitis 07/03/2012   Sarcoidosis 11/21/2011   GERD with stricture 10/12/2011   Epiretinal membrane (ERM) of both eyes 05/17/2011   PVD (posterior vitreous detachment), both eyes 05/17/2011   Uveitis 09/21/2007   Osteoarthritis 09/21/2007   Recurrent UTI 09/09/2007   Anxiety state 08/06/2007   IBS (irritable bowel syndrome) 08/06/2007   LOW BACK PAIN, CHRONIC 08/06/2007   History of colonic polyps 08/06/2007    Allergies:  Allergies  Allergen Reactions   Nitrofurantoin  Nausea Only    Significant and had to stop after 2-3 doses    Sertraline     Headaches, hot flashes, stomach issues, reflux, tinnitus- improved when she stopped. Occurred even on 12.'5mg'$  less than 25 mg.    Tape Rash    Blisters skin and removes skin --NEEDS PAPER TAPE   Medications:  Current Outpatient Medications:    cephALEXin (KEFLEX) 500 MG capsule, Take 1  capsule (500 mg total) by mouth 2 (two) times daily for 7 days., Disp: 14 capsule, Rfl: 0   amLODipine (NORVASC) 5 MG tablet, TAKE 1 TABLET(5 MG) BY MOUTH DAILY, Disp: 90 tablet, Rfl: 3   B Complex Vitamins (VITAMIN-B COMPLEX PO), Take 1 capsule by mouth daily., Disp: , Rfl:    busPIRone (BUSPAR) 5 MG tablet, Take 0.5-1 tablets (2.5-5 mg total) by mouth 2 (two) times daily as needed., Disp: 30 tablet, Rfl: 2   Calcium Carbonate (CALTRATE 600 PO), Take 1 tablet by mouth 2 (two) times daily., Disp: , Rfl:    cholecalciferol (VITAMIN D) 1000 units tablet, Take 1,000 Units by mouth daily., Disp: , Rfl:    esomeprazole (NEXIUM) 40 MG capsule, TAKE ONE CAPSULE BY MOUTH TWICE DAILY BEFORE A MEAL (Patient taking differently: 20 mg daily.), Disp: 180 capsule, Rfl: 1   Glucosamine HCl (GLUCOSAMINE PO), Liquid form - Take by mouth as directed once daily, Disp: , Rfl:    meclizine (ANTIVERT) 25 MG tablet, Take 1/2-1 tablet by mouth every 4 hours as needed for dizziness, Disp: , Rfl:    metoprolol tartrate (LOPRESSOR) 25 MG tablet, Take 1 tablet (25 mg total) by mouth at bedtime., Disp: 30 tablet, Rfl: 0   Multiple Vitamins-Minerals (MULTIVITAL) tablet, Take 1 tablet by mouth daily. 2- 3 times a week, Disp: , Rfl:    Omega-3 Fatty Acids (FISH OIL) 1000 MG CAPS, Take 1 capsule by mouth daily., Disp: , Rfl:    ondansetron (ZOFRAN-ODT) 4 MG disintegrating tablet, Take 1 tablet (4 mg total) by mouth every 8 (eight) hours as needed for nausea (with vertigo)., Disp: 30 tablet, Rfl: 1   predniSONE (DELTASONE) 5 MG tablet, TAKE 1 TABLET BY MOUTH DAILY WITH BREAKFAST, Disp: 90 tablet, Rfl: 3   Probiotic Product (ALIGN PO), Take by mouth daily., Disp: , Rfl:    simvastatin (ZOCOR) 20 MG tablet, TAKE 1 TABLET(20 MG) BY MOUTH AT BEDTIME, Disp: 90 tablet, Rfl: 3   sucralfate (CARAFATE) 1 g tablet, TAKE 1 TABLET(1 GRAM) BY MOUTH TWICE DAILY AS NEEDED, Disp: 180 tablet, Rfl: 3   tacrolimus (PROTOPIC) 0.1 % ointment, Apply  topically 2 (two) times daily., Disp: , Rfl:   Observations/Objective: Patient is well-developed, well-nourished in no acute distress.  Resting comfortably  at home.  Head is normocephalic, atraumatic.  No labored breathing.  Speech is clear and coherent with logical content.  Patient is alert and oriented at baseline.    Assessment and Plan: 1. Acute cystitis without hematuria  Rx keflex. Declined offer of antiemetic. Instructed to seek in person care if sx worsen or do not improve  Follow Up Instructions: I discussed the assessment and treatment plan with the patient. The patient was provided an opportunity to ask questions and all were answered. The patient agreed with the plan and demonstrated an understanding of the instructions.  A copy of instructions were sent to the patient via MyChart unless otherwise noted below.   The patient was advised to call back or seek an in-person evaluation if the symptoms worsen  or if the condition fails to improve as anticipated.  Time:  I spent 12 minutes with the patient via telehealth technology discussing the above problems/concerns.    Carvel Getting, NP

## 2022-06-04 ENCOUNTER — Ambulatory Visit (INDEPENDENT_AMBULATORY_CARE_PROVIDER_SITE_OTHER): Payer: Medicare Other | Admitting: Family

## 2022-06-04 ENCOUNTER — Encounter: Payer: Self-pay | Admitting: Family

## 2022-06-04 VITALS — BP 145/68 | HR 94 | Temp 97.8°F | Ht 68.0 in | Wt 166.4 lb

## 2022-06-04 DIAGNOSIS — N3090 Cystitis, unspecified without hematuria: Secondary | ICD-10-CM | POA: Diagnosis not present

## 2022-06-04 LAB — POCT URINALYSIS DIPSTICK
Bilirubin, UA: NEGATIVE
Blood, UA: NEGATIVE
Glucose, UA: NEGATIVE
Ketones, UA: NEGATIVE
Leukocytes, UA: NEGATIVE
Nitrite, UA: NEGATIVE
Protein, UA: NEGATIVE
Spec Grav, UA: 1.01 (ref 1.010–1.025)
Urobilinogen, UA: 0.2 E.U./dL
pH, UA: 6 (ref 5.0–8.0)

## 2022-06-04 NOTE — Progress Notes (Signed)
Patient ID: Laura [name], female    DOB: 1941/06/18, 81 y.o.   MRN: [id number]  Chief Complaint  Patient presents with   Urinary Tract Infection    Pt started feeling bad on Wednesday. Pt temp went up to 100.4.    Follow-up   Fever   Fatigue   Headache    HPI:      UTI:  pt tested at home and tested positive on 11/10, she did an e-visit w/[facility] and given Keflex '500mg'$  bid x 7d, started Friday. Pt c/o fever up to 100.5, along with fatigue, dysuria, low back pain, headache at that time. She reports fever last night up to 100.4.Last UTI back in jan. of this year. Last Tylenol 6 hours ago. Pt tested neg for Covid, had flu shot in October.      Assessment & Plan:  1. Cystitis treated on 11/10 w/Keflex, has taken for 4 days, most sx are improved, but still running fever. UA today is negative and her home test yesterday showed improvement. Advised pt and dtr (also present) to continue the antibiotic and finish, if still having fever after finished, or fever goes up to 101 or higher, let us know.  - POCT Urinalysis Dipstick - Urine Culture   Subjective:    Outpatient Medications Prior to Visit  Medication Sig Dispense Refill   amLODipine (NORVASC) 5 MG tablet TAKE 1 TABLET(5 MG) BY MOUTH DAILY 90 tablet 3   B Complex Vitamins (VITAMIN-B COMPLEX PO) Take 1 capsule by mouth daily.     busPIRone (BUSPAR) 5 MG tablet Take 0.5-1 tablets (2.5-5 mg total) by mouth 2 (two) times daily as needed. 30 tablet 2   Calcium Carbonate (CALTRATE 600 PO) Take 1 tablet by mouth 2 (two) times daily.     cephALEXin (KEFLEX) 500 MG capsule Take 1 capsule (500 mg total) by mouth 2 (two) times daily for 7 days. 14 capsule 0   cholecalciferol (VITAMIN D) 1000 units tablet Take 1,000 Units by mouth daily.     esomeprazole (NEXIUM) 40 MG capsule TAKE ONE CAPSULE BY MOUTH TWICE DAILY BEFORE A MEAL (Patient taking differently: 20 mg daily.) 180 capsule 1   Glucosamine HCl (GLUCOSAMINE PO) Liquid form - Take by  mouth as directed once daily     meclizine (ANTIVERT) 25 MG tablet Take 1/2-1 tablet by mouth every 4 hours as needed for dizziness     metoprolol tartrate (LOPRESSOR) 25 MG tablet Take 1 tablet (25 mg total) by mouth at bedtime. 30 tablet 0   Multiple Vitamins-Minerals (MULTIVITAL) tablet Take 1 tablet by mouth daily. 2- 3 times a week     Omega-3 Fatty Acids (FISH OIL) 1000 MG CAPS Take 1 capsule by mouth daily.     ondansetron (ZOFRAN-ODT) 4 MG disintegrating tablet Take 1 tablet (4 mg total) by mouth every 8 (eight) hours as needed for nausea (with vertigo). 30 tablet 1   predniSONE (DELTASONE) 5 MG tablet TAKE 1 TABLET BY MOUTH DAILY WITH BREAKFAST 90 tablet 3   Probiotic Product (ALIGN PO) Take by mouth daily.     simvastatin (ZOCOR) 20 MG tablet TAKE 1 TABLET(20 MG) BY MOUTH AT BEDTIME 90 tablet 3   sucralfate (CARAFATE) 1 g tablet TAKE 1 TABLET(1 GRAM) BY MOUTH TWICE DAILY AS NEEDED 180 tablet 3   tacrolimus (PROTOPIC) 0.1 % ointment Apply topically 2 (two) times daily.     No facility-administered medications prior to visit.   Past Medical History:  Diagnosis Date  Adenomatous colon polyp    sessile   Allergy    SEASONAL   Anxiety    Cancer ([place])    Skin cancer   Cataract    bilateral   Colon polyps    Diverticulosis of colon (without mention of hemorrhage)    DJD (degenerative joint disease)    Dysrhythmia    pac   Esophagitis    Essential hypertension 06/04/2017   GERD (gastroesophageal reflux disease)    Hiatal hernia    Hypercholesterolemia    IBS (irritable bowel syndrome)    MVP (mitral valve prolapse)    Osteopenia    Personal history of colonic polyps    SESSILE SERRATED ADENOMAS (X2)   PONV (postoperative nausea and vomiting)    Sarcoidosis    Shortness of breath dyspnea    Stricture and stenosis of esophagus    Unspecified gastritis and gastroduodenitis without mention of hemorrhage    Urinary tract infection, site not specified    Uveitis    Past  Surgical History:  Procedure Laterality Date   BASAL CELL CARCINOMA EXCISION     CATARACT EXTRACTION Bilateral    COLONOSCOPY  2012   MASS EXCISION Left 11/02/2014   Procedure: EXCISION MASS LEFT ELBOW;  Surgeon: Daryll Brod, MD;  Location: [facility];  Service: Orthopedics;  Laterality: Left;   POLYPECTOMY     UPPER GASTROINTESTINAL ENDOSCOPY     VESICOVAGINAL FISTULA CLOSURE W/ TAH     "sling" after hystrectomy per patient   Allergies  Allergen Reactions   Nitrofurantoin Nausea Only    Significant and had to stop after 2-3 doses    Sertraline     Headaches, hot flashes, stomach issues, reflux, tinnitus- improved when she stopped. Occurred even on 12.'5mg'$  less than 25 mg.    Tape Rash    Blisters skin and removes skin --NEEDS PAPER TAPE      Objective:    Physical Exam Vitals and nursing note reviewed.  Constitutional:      Appearance: Normal appearance.  Cardiovascular:     Rate and Rhythm: Normal rate and regular rhythm.  Pulmonary:     Effort: Pulmonary effort is normal.     Breath sounds: Normal breath sounds.  Musculoskeletal:        General: Normal range of motion.  Skin:    General: Skin is warm and dry.  Neurological:     Mental Status: She is alert.  Psychiatric:        Mood and Affect: Mood normal.        Behavior: Behavior normal.    BP (!) 145/68 (BP Location: Right Arm, Patient Position: Sitting)   Pulse 94   Temp 97.8 F (36.6 C) (Temporal)   Ht '5\' 8"'$  (1.727 m)   Wt 166 lb 6.4 oz (75.5 kg)   SpO2 96%   BMI 25.30 kg/m  Wt Readings from Last 3 Encounters:  06/04/22 166 lb 6.4 oz (75.5 kg)  02/09/22 167 lb 6.4 oz (75.9 kg)  12/04/21 166 lb 3.2 oz (75.4 kg)       Jeanie Sewer, NP

## 2022-06-05 LAB — URINE CULTURE
MICRO NUMBER:: 14180543
Result:: NO GROWTH
SPECIMEN QUALITY:: ADEQUATE

## 2022-06-06 NOTE — Progress Notes (Signed)
As you see, your urine culture is negative, confirming no infection. I hope you are feeling better!

## 2022-06-10 ENCOUNTER — Encounter: Payer: Self-pay | Admitting: Family Medicine

## 2022-06-13 ENCOUNTER — Encounter: Payer: Self-pay | Admitting: Family Medicine

## 2022-06-13 ENCOUNTER — Ambulatory Visit (INDEPENDENT_AMBULATORY_CARE_PROVIDER_SITE_OTHER): Payer: Medicare Other | Admitting: Family Medicine

## 2022-06-13 VITALS — BP 150/60 | HR 86 | Temp 98.1°F | Ht 68.0 in | Wt 165.0 lb

## 2022-06-13 DIAGNOSIS — N390 Urinary tract infection, site not specified: Secondary | ICD-10-CM

## 2022-06-13 DIAGNOSIS — R5383 Other fatigue: Secondary | ICD-10-CM | POA: Diagnosis not present

## 2022-06-13 DIAGNOSIS — R051 Acute cough: Secondary | ICD-10-CM

## 2022-06-13 LAB — POCT URINALYSIS DIPSTICK
Bilirubin, UA: NEGATIVE
Blood, UA: NEGATIVE
Glucose, UA: NEGATIVE
Ketones, UA: NEGATIVE
Leukocytes, UA: NEGATIVE
Nitrite, UA: NEGATIVE
Protein, UA: NEGATIVE
Spec Grav, UA: <=1.005 — AB (ref 1.010–1.025)
Urobilinogen, UA: 0.2 E.U./dL
pH, UA: 6 (ref 5.0–8.0)

## 2022-06-13 LAB — CBC WITH DIFFERENTIAL/PLATELET
Absolute Monocytes: 754 cells/uL (ref 200–950)
Basophils Absolute: 39 cells/uL (ref 0–200)
Basophils Relative: 0.3 %
Eosinophils Absolute: 52 cells/uL (ref 15–500)
Eosinophils Relative: 0.4 %
HCT: 40 % (ref 35.0–45.0)
Hemoglobin: 13.7 g/dL (ref 11.7–15.5)
Lymphs Abs: 1495 cells/uL (ref 850–3900)
MCH: 30 pg (ref 27.0–33.0)
MCHC: 34.3 g/dL (ref 32.0–36.0)
MCV: 87.5 fL (ref 80.0–100.0)
MPV: 9.6 fL (ref 7.5–12.5)
Monocytes Relative: 5.8 %
Neutro Abs: 10660 cells/uL — ABNORMAL HIGH (ref 1500–7800)
Neutrophils Relative %: 82 %
Platelets: 436 10*3/uL — ABNORMAL HIGH (ref 140–400)
RBC: 4.57 10*6/uL (ref 3.80–5.10)
RDW: 12.5 % (ref 11.0–15.0)
Total Lymphocyte: 11.5 %
WBC: 13 10*3/uL — ABNORMAL HIGH (ref 3.8–10.8)

## 2022-06-13 MED ORDER — AZITHROMYCIN 250 MG PO TABS: ORAL_TABLET | ORAL | 0 refills | Status: AC

## 2022-06-13 NOTE — Patient Instructions (Signed)
Hold simvastatin for 5 days Worse-weaker, trouble breathing, etc.  Go to Emergency room.   Get zpack.

## 2022-06-13 NOTE — Progress Notes (Signed)
Subjective:     Patient ID: Okey Dupre, female    DOB: 26-May-1941, 81 y.o.   MRN: [id number]  Chief Complaint  Patient presents with   Fatigue    Extreme fatigue Sx started 2 weeks ago recently completed antibiotics on 11/17    Cough    Dry cough   Fever    Low grade fever, 99.5    HPI-here w/daughter Fatigue for 2 wks.  Did urine test at home.  Did virtual visit and abx-keflex on 11/10. Not improving so seen 11/13-UA neg so finished keflex Dry cough sev days later-did 2 home covid tests and neg Low grade fever still and fatigue Was on keflex until 11/17 then still not feeling well, ha, fever.   Home urine test + last pm and this am.      Still home "hot feeling" achey, fatigue, sore throat.  Some DOE.     H/o sarcoidosis   There are no preventive care reminders to display for this patient.  Past Medical History:  Diagnosis Date   Adenomatous colon polyp    sessile   Allergy    SEASONAL   Anxiety    Cancer ([place])    Skin cancer   Cataract    bilateral   Colon polyps    Diverticulosis of colon (without mention of hemorrhage)    DJD (degenerative joint disease)    Dysrhythmia    pac   Esophagitis    Essential hypertension 06/04/2017   GERD (gastroesophageal reflux disease)    Hiatal hernia    Hypercholesterolemia    IBS (irritable bowel syndrome)    MVP (mitral valve prolapse)    Osteopenia    Personal history of colonic polyps    SESSILE SERRATED ADENOMAS (X2)   PONV (postoperative nausea and vomiting)    Sarcoidosis    Shortness of breath dyspnea    Stricture and stenosis of esophagus    Unspecified gastritis and gastroduodenitis without mention of hemorrhage    Urinary tract infection, site not specified    Uveitis     Past Surgical History:  Procedure Laterality Date   BASAL CELL CARCINOMA EXCISION     CATARACT EXTRACTION Bilateral    COLONOSCOPY  2012   MASS EXCISION Left 11/02/2014   Procedure: EXCISION MASS LEFT ELBOW;  Surgeon: Daryll Brod, MD;  Location: [facility];  Service: Orthopedics;  Laterality: Left;   POLYPECTOMY     UPPER GASTROINTESTINAL ENDOSCOPY     VESICOVAGINAL FISTULA CLOSURE W/ TAH     "sling" after hystrectomy per patient    Outpatient Medications Prior to Visit  Medication Sig Dispense Refill   amLODipine (NORVASC) 2.5 MG tablet      B Complex Vitamins (VITAMIN-B COMPLEX PO) Take 1 capsule by mouth daily.     busPIRone (BUSPAR) 5 MG tablet Take 0.5-1 tablets (2.5-5 mg total) by mouth 2 (two) times daily as needed. 30 tablet 2   Calcium Carbonate (CALTRATE 600 PO) Take 1 tablet by mouth 2 (two) times daily.     cholecalciferol (VITAMIN D) 1000 units tablet Take 1,000 Units by mouth daily.     esomeprazole (NEXIUM) 40 MG capsule TAKE ONE CAPSULE BY MOUTH TWICE DAILY BEFORE A MEAL (Patient taking differently: 20 mg daily.) 180 capsule 1   Glucosamine HCl (GLUCOSAMINE PO) Liquid form - Take by mouth as directed once daily     ketoconazole (NIZORAL) 2 % cream APPLY TOPICALLY TO THE AFFECTED AREA TWICE DAILY  meclizine (ANTIVERT) 25 MG tablet Take 1/2-1 tablet by mouth every 4 hours as needed for dizziness     metoprolol tartrate (LOPRESSOR) 25 MG tablet Take 1 tablet (25 mg total) by mouth at bedtime. 30 tablet 0   Multiple Vitamins-Minerals (MULTIVITAL) tablet Take 1 tablet by mouth daily. 2- 3 times a week     Omega-3 Fatty Acids (FISH OIL) 1000 MG CAPS Take 1 capsule by mouth daily.     ondansetron (ZOFRAN-ODT) 4 MG disintegrating tablet Take 1 tablet (4 mg total) by mouth every 8 (eight) hours as needed for nausea (with vertigo). 30 tablet 1   predniSONE (DELTASONE) 5 MG tablet TAKE 1 TABLET BY MOUTH DAILY WITH BREAKFAST 90 tablet 3   Probiotic Product (ALIGN PO) Take by mouth daily.     simvastatin (ZOCOR) 20 MG tablet TAKE 1 TABLET(20 MG) BY MOUTH AT BEDTIME 90 tablet 3   sucralfate (CARAFATE) 1 g tablet TAKE 1 TABLET(1 GRAM) BY MOUTH TWICE DAILY AS NEEDED 180 tablet 3   SYSTANE  ULTRA 0.4-0.3 % SOLN Apply 1 drop to eye 2 (two) times daily.     tacrolimus (PROTOPIC) 0.1 % ointment Apply topically 2 (two) times daily.     triamcinolone ointment (KENALOG) 0.1 % APPLY THIN LAYER TOPICALLY TO THE AFFECTED AREA TWICE DAILY     metroNIDAZOLE (METROGEL) 0.75 % gel as needed.     amLODipine (NORVASC) 5 MG tablet TAKE 1 TABLET(5 MG) BY MOUTH DAILY 90 tablet 3   No facility-administered medications prior to visit.    Allergies  Allergen Reactions   Codeine Nausea Only and Other (See Comments)    REACTION: nausea   Cortisone Other (See Comments)   Nitrofurantoin Nausea Only    Significant and had to stop after 2-3 doses    Sertraline     Headaches, hot flashes, stomach issues, reflux, tinnitus- improved when she stopped. Occurred even on 12.'5mg'$  less than 25 mg.    Tape Rash and Other (See Comments)    Blisters skin and removes skin --NEEDS PAPER TAPE   ROS neg/noncontributory except as noted HPI/below      Objective:     BP (!) 150/60   Pulse 86   Temp 98.1 F (36.7 C) (Temporal)   Ht '5\' 8"'$  (1.727 m)   Wt 165 lb (74.8 kg)   SpO2 95%   BMI 25.09 kg/m  Wt Readings from Last 3 Encounters:  06/13/22 165 lb (74.8 kg)  06/04/22 166 lb 6.4 oz (75.5 kg)  02/09/22 167 lb 6.4 oz (75.9 kg)    Physical Exam   Gen: WDWN NAD nontoxic HEENT: NCAT, conjunctiva not injected, sclera nonicteric TM WNL B, OP moist, no exudates  NECK:  supple, no thyromegaly, no nodes, no carotid bruits CARDIAC: RRR, S1S2+, 1-2/6 murmur.  LUNGS: CTAB. No wheezes ABDOMEN:  BS+, soft, NTND, No HSM, no masses.  No cvat EXT:  no edema MSK: no gross abnormalities.  NEURO: A&O x3.  CN II-XII intact.  PSYCH: normal mood. Good eye contact  Results for orders placed or performed in visit on 06/13/22  POCT urinalysis dipstick  Result Value Ref Range   Color, UA YELLOW    Clarity, UA CLEAR    Glucose, UA Negative Negative   Bilirubin, UA NEG    Ketones, UA NEG    Spec Grav, UA <=1.005  (A) 1.010 - 1.025   Blood, UA NEG    pH, UA 6.0 5.0 - 8.0   Protein, UA Negative Negative  Urobilinogen, UA 0.2 0.2 or 1.0 E.U./dL   Nitrite, UA NEG    Leukocytes, UA Negative Negative   Appearance     Odor      Reviewed notes from prev visits    Assessment & Plan:   Problem List Items Addressed This Visit       Genitourinary   Recurrent UTI - Primary   Relevant Medications   ketoconazole (NIZORAL) 2 % cream   Other Relevant Orders   POCT urinalysis dipstick (Completed)   Other Visit Diagnoses     Other fatigue       Relevant Orders   CBC with Differential/Platelet      Recurrent UTI-urine neg and cx neg and was tx'd w/keflex so doubt a problem Cough/fatigue-?viral/bacterial,cardiac/other.  Check cbcd.  Start zpk.  Worse, sob, etc-go to ER.    No orders of the defined types were placed in this encounter.   Wellington Hampshire, MD

## 2022-06-16 ENCOUNTER — Encounter: Payer: Self-pay | Admitting: Family Medicine

## 2022-06-16 NOTE — Progress Notes (Signed)
Wbc elevated.  We started zpack.  How is she doing?

## 2022-06-19 ENCOUNTER — Ambulatory Visit: Payer: Medicare Other | Admitting: Family Medicine

## 2022-06-22 ENCOUNTER — Ambulatory Visit (INDEPENDENT_AMBULATORY_CARE_PROVIDER_SITE_OTHER)
Admission: RE | Admit: 2022-06-22 | Discharge: 2022-06-22 | Disposition: A | Discharge status: Home / Self Care | Payer: Medicare Other | Source: Ambulatory Visit | Attending: Family Medicine | Admitting: Family Medicine

## 2022-06-22 ENCOUNTER — Other Ambulatory Visit (INDEPENDENT_AMBULATORY_CARE_PROVIDER_SITE_OTHER): Payer: Medicare Other

## 2022-06-22 ENCOUNTER — Ambulatory Visit (INDEPENDENT_AMBULATORY_CARE_PROVIDER_SITE_OTHER): Payer: Medicare Other | Admitting: Family Medicine

## 2022-06-22 ENCOUNTER — Encounter: Payer: Self-pay | Admitting: Family Medicine

## 2022-06-22 VITALS — BP 136/66 | HR 85 | Temp 97.8°F | Ht 68.0 in | Wt 163.8 lb

## 2022-06-22 DIAGNOSIS — R052 Subacute cough: Secondary | ICD-10-CM

## 2022-06-22 DIAGNOSIS — R059 Cough, unspecified: Secondary | ICD-10-CM | POA: Diagnosis not present

## 2022-06-22 DIAGNOSIS — R5383 Other fatigue: Secondary | ICD-10-CM | POA: Diagnosis not present

## 2022-06-22 DIAGNOSIS — D860 Sarcoidosis of lung: Secondary | ICD-10-CM

## 2022-06-22 DIAGNOSIS — E785 Hyperlipidemia, unspecified: Secondary | ICD-10-CM | POA: Diagnosis not present

## 2022-06-22 DIAGNOSIS — I10 Essential (primary) hypertension: Secondary | ICD-10-CM

## 2022-06-22 LAB — CBC WITH DIFFERENTIAL/PLATELET
Basophils Absolute: 0 10*3/uL (ref 0.0–0.1)
Basophils Relative: 0.3 % (ref 0.0–3.0)
Eosinophils Absolute: 0 10*3/uL (ref 0.0–0.7)
Eosinophils Relative: 0.2 % (ref 0.0–5.0)
HCT: 40.7 % (ref 36.0–46.0)
Hemoglobin: 13.9 g/dL (ref 12.0–15.0)
Lymphocytes Relative: 8.5 % — ABNORMAL LOW (ref 12.0–46.0)
Lymphs Abs: 1.1 10*3/uL (ref 0.7–4.0)
MCHC: 34.2 g/dL (ref 30.0–36.0)
MCV: 89.7 fl (ref 78.0–100.0)
Monocytes Absolute: 0.7 10*3/uL (ref 0.1–1.0)
Monocytes Relative: 5.6 % (ref 3.0–12.0)
Neutro Abs: 11.3 10*3/uL — ABNORMAL HIGH (ref 1.4–7.7)
Neutrophils Relative %: 85.4 % — ABNORMAL HIGH (ref 43.0–77.0)
Platelets: 412 10*3/uL — ABNORMAL HIGH (ref 150.0–400.0)
RBC: 4.53 Mil/uL (ref 3.87–5.11)
RDW: 14 % (ref 11.5–15.5)
WBC: 13.2 10*3/uL — ABNORMAL HIGH (ref 4.0–10.5)

## 2022-06-22 LAB — TSH: TSH: 0.72 u[IU]/mL (ref 0.35–5.50)

## 2022-06-22 LAB — COMPREHENSIVE METABOLIC PANEL
ALT: 18 U/L (ref 0–35)
AST: 16 U/L (ref 0–37)
Albumin: 3.9 g/dL (ref 3.5–5.2)
Alkaline Phosphatase: 77 U/L (ref 39–117)
BUN: 18 mg/dL (ref 6–23)
CO2: 28 mEq/L (ref 19–32)
Calcium: 9.3 mg/dL (ref 8.4–10.5)
Chloride: 103 mEq/L (ref 96–112)
Creatinine, Ser: 0.79 mg/dL (ref 0.40–1.20)
GFR: 69.92 mL/min (ref >60.00)
Glucose, Bld: 107 mg/dL — ABNORMAL HIGH (ref 70–99)
Potassium: 3.9 mEq/L (ref 3.5–5.1)
Sodium: 140 mEq/L (ref 135–145)
Total Bilirubin: 0.5 mg/dL (ref 0.2–1.2)
Total Protein: 7.3 g/dL (ref 6.0–8.3)

## 2022-06-22 NOTE — Progress Notes (Signed)
Phone [phone] In person visit   Subjective:   Laura [name] is a 81 y.o. year old very pleasant female patient who presents for/with See problem oriented charting Chief Complaint  Patient presents with   Follow-up    Pt is here to follow up per Dr.Kuilk to repeat blood work to make sure infection is gone.    Fatigue   Cough   Past Medical History-  Patient Active Problem List   Diagnosis Date Noted   Steroid-induced hyperglycemia 10/18/2016    Priority: High   Sarcoidosis 11/21/2011    Priority: High   Essential hypertension 06/04/2017    Priority: Medium    Hyperlipidemia 10/18/2016    Priority: Medium    Female bladder prolapse 03/22/2015    Priority: Medium    Osteoporosis 12/06/2014    Priority: Medium    Dyspnea 08/20/2013    Priority: Medium    GERD with stricture 10/12/2011    Priority: Medium    Uveitis 09/21/2007    Priority: Medium    Recurrent UTI 09/09/2007    Priority: Medium    Anxiety state 08/06/2007    Priority: Medium    History of colonic polyps 08/06/2007    Priority: Medium    History of basal cell cancer 10/18/2016    Priority: Low   Compression fx, lumbar spine ([place]) 09/24/2012    Priority: Low   PVD (posterior vitreous detachment), both eyes 05/17/2011    Priority: Low   Osteoarthritis 09/21/2007    Priority: Low   IBS (irritable bowel syndrome) 08/06/2007    Priority: Low   LOW BACK PAIN, CHRONIC 08/06/2007    Priority: Low   Intermediate uveitis 07/03/2012   Epiretinal membrane (ERM) of both eyes 05/17/2011    Medications- reviewed and updated Current Outpatient Medications  Medication Sig Dispense Refill   amLODipine (NORVASC) 2.5 MG tablet      B Complex Vitamins (VITAMIN-B COMPLEX PO) Take 1 capsule by mouth daily.     busPIRone (BUSPAR) 5 MG tablet Take 0.5-1 tablets (2.5-5 mg total) by mouth 2 (two) times daily as needed. 30 tablet 2   Calcium Carbonate (CALTRATE 600 PO) Take 1 tablet by mouth 2 (two) times  daily.     cholecalciferol (VITAMIN D) 1000 units tablet Take 1,000 Units by mouth daily.     esomeprazole (NEXIUM) 40 MG capsule TAKE ONE CAPSULE BY MOUTH TWICE DAILY BEFORE A MEAL (Patient taking differently: 20 mg daily.) 180 capsule 1   Glucosamine HCl (GLUCOSAMINE PO) Liquid form - Take by mouth as directed once daily     ketoconazole (NIZORAL) 2 % cream APPLY TOPICALLY TO THE AFFECTED AREA TWICE DAILY     meclizine (ANTIVERT) 25 MG tablet Take 1/2-1 tablet by mouth every 4 hours as needed for dizziness     metoprolol tartrate (LOPRESSOR) 25 MG tablet Take 1 tablet (25 mg total) by mouth at bedtime. 30 tablet 0   metroNIDAZOLE (METROGEL) 0.75 % gel as needed.     Multiple Vitamins-Minerals (MULTIVITAL) tablet Take 1 tablet by mouth daily. 2- 3 times a week     Omega-3 Fatty Acids (FISH OIL) 1000 MG CAPS Take 1 capsule by mouth daily.     ondansetron (ZOFRAN-ODT) 4 MG disintegrating tablet Take 1 tablet (4 mg total) by mouth every 8 (eight) hours as needed for nausea (with vertigo). 30 tablet 1   predniSONE (DELTASONE) 5 MG tablet TAKE 1 TABLET BY MOUTH DAILY WITH BREAKFAST 90 tablet 3   Probiotic Product (  ALIGN PO) Take by mouth daily.     simvastatin (ZOCOR) 20 MG tablet TAKE 1 TABLET(20 MG) BY MOUTH AT BEDTIME 90 tablet 3   sucralfate (CARAFATE) 1 g tablet TAKE 1 TABLET(1 GRAM) BY MOUTH TWICE DAILY AS NEEDED 180 tablet 3   SYSTANE ULTRA 0.4-0.3 % SOLN Apply 1 drop to eye 2 (two) times daily.     tacrolimus (PROTOPIC) 0.1 % ointment Apply topically 2 (two) times daily.     triamcinolone ointment (KENALOG) 0.1 % APPLY THIN LAYER TOPICALLY TO THE AFFECTED AREA TWICE DAILY     No current facility-administered medications for this visit.     Objective:  BP 136/66 (BP Location: Left Arm, Patient Position: Sitting)   Pulse 85   Temp 97.8 F (36.6 C) (Temporal)   Ht '5\' 8"'$  (1.727 m)   Wt 163 lb 12.8 oz (74.3 kg)   SpO2 94%   BMI 24.91 kg/m  Gen: NAD, resting comfortably, frequent cough  during visit CV: RRR no murmurs rubs or gallops Lungs: CTAB no crackles, wheeze, rhonchi Abdomen: soft/nontender/nondistended/normal bowel sounds. No rebound or guarding.  Ext: no edema Skin: warm, dry     Assessment and Plan   # Cough/congestion/fatigue S: Seen on a video visit on 06/01/2022 by Willeen Cass, NP-diagnosed with acute cystitis without hematuria and treated with Keflex.  A home test had suggested UTI.  Patient had temperature up to 100.5 along with fatigue, dysuria, low back pain, headache and had tested negative for COVID. Seen 3 days later by Cherylann Parr, NP at our office-no evidence of infection at that time while on antibiotics.  On 06/13/2022 had follow-up with Dr. Marlowe Shores was reassuring.  Patient did have some cough and fatigue lingering over 2-weeks at that point.  And there was some concern could be viral or bacterial with lower likelihood of cardiac issues-was started on azithromycin and recommended emergent care if develop worsening symptoms.  Had tested negative for COVID again at that time at home.  Simvastatin was held to see if would help with fatigue.   -On labs on 06/13/2022 did have mild leukocytosis with left shift and mildly high platelets  -On MyChart message on 06/16/2022 mention slowly improving symptoms  Today reports ongoing cough and ongoing fatigue (she states extreme). Appetite waxes and wanes. . Essentially stagnated since message on 25th. Getting night sitters for husband now- he doesn't sleep well. She herself has had some trouble going to sleep. Now over 3 weeks of fatigue with cough- but worsened most around 06/04/22. Tylenol and robitussen helps some- off these and thankfully no fevers -some fatigue prior to illness but up with husband a lot then (better now) -on prednisone chronically at 5 mg. Not much wheezing -6 month follow up with Dr. Claiborne Billings for pulmonary sarcoidosis in January. Did have x-ray 04/04/22- bilateral hilar prominene  and biapical pulmonary opacities- thought related to sarcoidosis -not particularly short of breath but cannot take deep breath without cough and may be hard to tell as not as mobile wit illness. No chest pain A/P: Patient with cough and congestion for nearly 3 weeks with underlying pulmonary sarcoidosis - Has already been treated with azithromycin with some improvement but seems to have stagnated and improvement since completing course -I wonder about poorly controlled underlying pulmonary sarcoidosis.  Typically follows with Dr. Claiborne Billings in [place] with [facility]-on chronic prednisone 5 mg - Get chest x-ray to rule out pneumonia (particularly with leukocytosis and persistent fatigue and cough) but if no pneumonia planning on  trial of prednisone - jittery and flushing on 40 mg prednisone- would use '20mg'$   -Does have steroid-induced hyperglycemia-too early for repeat A1c but we will get random blood sugar-if substantially elevated may push [place] to check another A1c -Some fatigue could be from caregiver burden but they are working actively to reduce that burden for her with her illness -Does have GERD but on is omeprazole 20 mg-doubt symptoms are primarily driven by reflux-continue current medication -We opted for close follow-up in 2 weeks-if not improving may need to broaden investigation  #hypertension S: medication: amlodipine 5 mg BP Readings from Last 3 Encounters:  06/22/22 136/66  06/13/22 (!) 150/60  06/04/22 (!) 145/68  A/P: High acceptable range-continue current medication  #hyperlipidemia S: Medication: Simvastatin 20 mg.  Fatigue did not improve substantially with holding for 5 days Lab Results  Component Value Date   CHOL 152 03/17/2021   HDL 47.10 03/17/2021   LDLCALC 74 03/17/2021   LDLDIRECT 91.0 02/09/2022   TRIG 153.0 (H) 03/17/2021   CHOLHDL 3 03/17/2021   A/P: We are drawing blood work and considered checking lipids but since we have had to hold medication recently opted to  hold off until next labs-suspect reasonably controlled for her age-continue current medication   Recommended follow up: Return in about 2 weeks (around 07/06/2022) for followup or sooner if needed.Schedule b4 you leave. Future Appointments  Date Time Provider [place]  07/05/2022 11:00 AM Marin Olp, MD [facility]  07/27/2022  1:20 PM Marin Olp, MD [facility]  09/20/2022 11:00 AM [facility] HEALTH COACH [facility]    Lab/Order associations:   ICD-10-CM   1. Subacute cough  R05.2 DG Chest 2 View    2. Fatigue, unspecified type  R53.83 CBC with Differential/Platelet    Comprehensive metabolic panel    TSH    CANCELED: CBC with Differential/Platelet    CANCELED: Comprehensive metabolic panel    CANCELED: TSH    3. Pulmonary sarcoidosis ([place])  D86.0 DG Chest 2 View    4. Essential hypertension  I10     5. Hyperlipidemia, unspecified hyperlipidemia type  E78.5      Return precautions advised.  Garret Reddish, MD

## 2022-06-22 NOTE — Patient Instructions (Addendum)
Please stop by lab before you go If you have mychart- we will send your results within 3 business days of [place] receiving them.  If you do not have mychart- we will call you about results within 5 business days of [place] receiving them.  *please also note that you will see labs on mychart as soon as they post. I will later go in and write notes on them- will say "notes from Dr. Yong Channel"   Please go to [facility] (updated 09/17/2019) - located [address] across [address] from [place] - in the basement - Hours: 7:30-5:30 PM M-F. You do NOT need an appointment.   Recommended follow up: Return in about 2 weeks (around 07/06/2022) for followup or sooner if needed.Schedule b4 you leave.

## 2022-06-25 ENCOUNTER — Telehealth: Payer: Self-pay | Admitting: Family Medicine

## 2022-06-25 ENCOUNTER — Encounter: Payer: Self-pay | Admitting: Family Medicine

## 2022-06-25 MED ORDER — AMOXICILLIN-POT CLAVULANATE 875-125 MG PO TABS: 1.0000 | ORAL_TABLET | Freq: Two times a day (BID) | ORAL | 0 refills | Status: AC

## 2022-06-25 NOTE — Telephone Encounter (Signed)
Caller states: -He was calling just to make sure PCP could seen imaging results from DG chest view on 06/22/22.  I informed caller than an impression was visible. Caller verbalized understanding.

## 2022-06-25 NOTE — Telephone Encounter (Signed)
Laura [name] with [facility] requests to be called at ph#  [phone] RE: chest XRAY Call Report for

## 2022-06-25 NOTE — Telephone Encounter (Signed)
I am sending in an antibiotic and lets keep follow-up already scheduled-I will also send a result note on epic

## 2022-07-05 ENCOUNTER — Encounter: Payer: Self-pay | Admitting: Family Medicine

## 2022-07-05 ENCOUNTER — Ambulatory Visit (INDEPENDENT_AMBULATORY_CARE_PROVIDER_SITE_OTHER): Payer: Medicare Other | Admitting: Family Medicine

## 2022-07-05 VITALS — BP 110/60 | HR 71 | Temp 97.9°F | Ht 68.0 in | Wt 165.0 lb

## 2022-07-05 DIAGNOSIS — I10 Essential (primary) hypertension: Secondary | ICD-10-CM | POA: Diagnosis not present

## 2022-07-05 DIAGNOSIS — D72829 Elevated white blood cell count, unspecified: Secondary | ICD-10-CM

## 2022-07-05 DIAGNOSIS — D860 Sarcoidosis of lung: Secondary | ICD-10-CM | POA: Diagnosis not present

## 2022-07-05 LAB — CBC WITH DIFFERENTIAL/PLATELET
Basophils Absolute: 0 10*3/uL (ref 0.0–0.1)
Basophils Relative: 0.3 % (ref 0.0–3.0)
Eosinophils Absolute: 0 10*3/uL (ref 0.0–0.7)
Eosinophils Relative: 0.3 % (ref 0.0–5.0)
HCT: 42.9 % (ref 36.0–46.0)
Hemoglobin: 14.2 g/dL (ref 12.0–15.0)
Lymphocytes Relative: 9.8 % — ABNORMAL LOW (ref 12.0–46.0)
Lymphs Abs: 1.3 10*3/uL (ref 0.7–4.0)
MCHC: 33.1 g/dL (ref 30.0–36.0)
MCV: 91 fl (ref 78.0–100.0)
Monocytes Absolute: 0.6 10*3/uL (ref 0.1–1.0)
Monocytes Relative: 4.5 % (ref 3.0–12.0)
Neutro Abs: 11.1 10*3/uL — ABNORMAL HIGH (ref 1.4–7.7)
Neutrophils Relative %: 85.1 % — ABNORMAL HIGH (ref 43.0–77.0)
Platelets: 308 10*3/uL (ref 150.0–400.0)
RBC: 4.72 Mil/uL (ref 3.87–5.11)
RDW: 14.7 % (ref 11.5–15.5)
WBC: 13 10*3/uL — ABNORMAL HIGH (ref 4.0–10.5)

## 2022-07-05 LAB — SEDIMENTATION RATE: Sed Rate: 41 mm/hr — ABNORMAL HIGH (ref 0–30)

## 2022-07-05 LAB — C-REACTIVE PROTEIN: CRP: <1 mg/dL (ref 0.5–20.0)

## 2022-07-05 NOTE — Progress Notes (Signed)
Phone [phone] In person visit   Subjective:   Laura [name] is a 81 y.o. year old very pleasant female patient who presents for/with See problem oriented charting Chief Complaint  Patient presents with   Follow-up    Pt is here to f/u on her cough she states she feels some what better and more quieter and still coughing up brown mucous from time to time.   Fatigue    Pt c/o feeling very fatigue.    Past Medical History-  Patient Active Problem List   Diagnosis Date Noted   Steroid-induced hyperglycemia 10/18/2016    Priority: High   Sarcoidosis 11/21/2011    Priority: High   Essential hypertension 06/04/2017    Priority: Medium    Hyperlipidemia 10/18/2016    Priority: Medium    Female bladder prolapse 03/22/2015    Priority: Medium    Osteoporosis 12/06/2014    Priority: Medium    Dyspnea 08/20/2013    Priority: Medium    GERD with stricture 10/12/2011    Priority: Medium    Uveitis 09/21/2007    Priority: Medium    Recurrent UTI 09/09/2007    Priority: Medium    Anxiety state 08/06/2007    Priority: Medium    History of colonic polyps 08/06/2007    Priority: Medium    History of basal cell cancer 10/18/2016    Priority: Low   Compression fx, lumbar spine ([place]) 09/24/2012    Priority: Low   PVD (posterior vitreous detachment), both eyes 05/17/2011    Priority: Low   Osteoarthritis 09/21/2007    Priority: Low   IBS (irritable bowel syndrome) 08/06/2007    Priority: Low   LOW BACK PAIN, CHRONIC 08/06/2007    Priority: Low   Intermediate uveitis 07/03/2012   Epiretinal membrane (ERM) of both eyes 05/17/2011    Medications- reviewed and updated Current Outpatient Medications  Medication Sig Dispense Refill   amLODipine (NORVASC) 2.5 MG tablet      B Complex Vitamins (VITAMIN-B COMPLEX PO) Take 1 capsule by mouth daily.     busPIRone (BUSPAR) 5 MG tablet Take 0.5-1 tablets (2.5-5 mg total) by mouth 2 (two) times daily as needed. 30 tablet 2    Calcium Carbonate (CALTRATE 600 PO) Take 1 tablet by mouth 2 (two) times daily.     cholecalciferol (VITAMIN D) 1000 units tablet Take 1,000 Units by mouth daily.     esomeprazole (NEXIUM) 40 MG capsule TAKE ONE CAPSULE BY MOUTH TWICE DAILY BEFORE A MEAL (Patient taking differently: 20 mg daily.) 180 capsule 1   Glucosamine HCl (GLUCOSAMINE PO) Liquid form - Take by mouth as directed once daily     ketoconazole (NIZORAL) 2 % cream APPLY TOPICALLY TO THE AFFECTED AREA TWICE DAILY     meclizine (ANTIVERT) 25 MG tablet Take 1/2-1 tablet by mouth every 4 hours as needed for dizziness     metoprolol tartrate (LOPRESSOR) 25 MG tablet Take 1 tablet (25 mg total) by mouth at bedtime. 30 tablet 0   metroNIDAZOLE (METROGEL) 0.75 % gel as needed.     Multiple Vitamins-Minerals (MULTIVITAL) tablet Take 1 tablet by mouth daily. 2- 3 times a week     Omega-3 Fatty Acids (FISH OIL) 1000 MG CAPS Take 1 capsule by mouth daily.     ondansetron (ZOFRAN-ODT) 4 MG disintegrating tablet Take 1 tablet (4 mg total) by mouth every 8 (eight) hours as needed for nausea (with vertigo). 30 tablet 1   predniSONE (DELTASONE) 5 MG tablet  TAKE 1 TABLET BY MOUTH DAILY WITH BREAKFAST 90 tablet 3   Probiotic Product (ALIGN PO) Take by mouth daily.     simvastatin (ZOCOR) 20 MG tablet TAKE 1 TABLET(20 MG) BY MOUTH AT BEDTIME 90 tablet 3   sucralfate (CARAFATE) 1 g tablet TAKE 1 TABLET(1 GRAM) BY MOUTH TWICE DAILY AS NEEDED 180 tablet 3   SYSTANE ULTRA 0.4-0.3 % SOLN Apply 1 drop to eye 2 (two) times daily.     tacrolimus (PROTOPIC) 0.1 % ointment Apply topically 2 (two) times daily.     triamcinolone ointment (KENALOG) 0.1 % APPLY THIN LAYER TOPICALLY TO THE AFFECTED AREA TWICE DAILY     No current facility-administered medications for this visit.     Objective:  BP 110/60   Pulse 71   Temp 97.9 F (36.6 C)   Ht _0  (1.727 m)   Wt 165 lb (74.8 kg)   SpO2 94%   BMI 25.09 kg/m  Gen: NAD, resting comfortably CV: RRR no  murmurs rubs or gallops Lungs: CTAB no crackles, wheeze, rhonchi Ext: trace edema Skin: warm, dry     Assessment and Plan    # Ongoing cough in patient with sarcoidosis and... #abnormal chest x-ray S: Patient was seen on 06/22/2022 with 3 weeks of cough-known underlying pulmonary sarcoidosis.  Had already been treated with azithromycin with some improvement but had stagnated.  We were concerned about pulmonary sarcoidosis contributing-she remains on prednisone 5 mg at that time.  We did a chest x-ray and were concerned about pneumonia as well-we opted to treat with Augmentin and not increase prednisone as a result of chest x-ray which can be seen below  DG Chest 2 View  Result Date: 06/24/2022 CLINICAL DATA:  Cough for 3 weeks EXAM: CHEST - 2 VIEW COMPARISON:  Chest radiographs, 05/23/2021, CT chest, 01/06/2016 FINDINGS: Heart size is normal. Calcified mediastinal and hilar lymph nodes. New, dense appearing consolidation of the left pulmonary apex. Otherwise similar perihilar consolidation and heterogeneous opacity. Disc degenerative disease of the thoracic spine. IMPRESSION: New, dense appearing consolidation of the left pulmonary apex, which may reflect infection, mass, or progression of fibrotic sarcoidosis identified by prior CT. Otherwise similar perihilar consolidation and heterogeneous opacity. Recommend CT to further evaluate, or at minimum additional radiographs in 6-8 weeks to assess for resolution. These results will be called to the ordering clinician or representative by the Radiologist Assistant, and communication documented in the PACS or [employer]. Electronically Signed   By: Delanna Ahmadi M.D.   On: 06/24/2022 14:26    Today patient reports some improvement in cough-quieter-but still coughing up brown mucus from time to time. Cough gets worse as day goes on or if she talks a lot.  She also feels very fatigued. No fever. No wheeze. Shortness of breath about the same but very  inactive so hard to say fully- went into [employer] for instance and had to sit down which is atypical for her- but denies shortness of breath  -does have appointment for sarcoidosis follow up in February  Had also been treated for UTI recently prior to this.  A/P: ongoing cough for 5 weeks - reports abotu 50% improvement to date with prior azithromycin then we added on augmentin based on imaging results above and concern for possible lobar pneumonia - given known sarcoidosis and concern for mass vs infection without clearance of symptoms on antibiotic course- disceed ordering CT scan.  She lives near [place] and works with [facility] Dr. Claiborne Billings for  this specifically and wants to call his office to see if this can be set up closer to her home - She will reach back out if unable to get this set up - We opted to not increase prednisone without their input and without repeat imaging and pulmonary input as well as more data- ACE, sed rate, esr   - also with prior leukocytosis update CBC again   - With ongoing cough/pneumonia patient has had some generalized weakness developed-she is going to check with [facility] rehab and I am more than happy to place an order for her for PT for strengthening  #hypertension S: medication: amlodipine 2.5 mg, metoprolol 25 mg-only as needed if has palpitations BP Readings from Last 3 Encounters:  07/05/22 110/60  06/22/22 136/66  06/13/22 (!) 150/60  A/P: stable- continue current medicines   Recommended follow EE:[facility]OF[facility] for as needed for new, worsening, persistent symptoms. But we also have close follow up in january Future Appointments  Date Time Provider [place]  07/27/2022  1:20 PM Marin Olp, MD [facility]  09/20/2022 11:00 AM [facility] HEALTH COACH [facility]    Lab/Order associations:   ICD-10-CM   1. Pulmonary sarcoidosis (HCC)  D86.0 Angiotensin converting enzyme    Sedimentation rate    C-reactive protein    2. Essential  hypertension  I10     3. Leukocytosis, unspecified type  D72.829 CBC with Differential/Platelet     Return precautions advised.  Garret Reddish, MD

## 2022-07-05 NOTE — Patient Instructions (Addendum)
Glad you have improved and we were considering just follo wup chest x-ray if complete improvement but since you are not back to your baseline despite 2 antibiotic courses specifically for pneumonia (azithromycin and augmentin) I would recommend doing CT scan based on imaging results. It would be more convenient to do through Dr. Evette Georges office based on location so we did not order today- you are going to reach out to them (they may want to see you first). If you would like for [place] to order after that conversation we certainly can still.  -you can also let them know we have pending repeat CBC (to follow white count, ACE level, sed rate, CRP  Keep me updated on progress pretty please- look forwrad to seeing you on the 5th otherwise- new or worsening symptoms let us know  DG Chest 2 View Result Date: 06/24/2022 IMPRESSION: New, dense appearing consolidation of the left pulmonary apex, which may reflect infection, mass, or progression of fibrotic sarcoidosis identified by prior CT. Otherwise similar perihilar consolidation and heterogeneous opacity. Recommend CT to further evaluate, or at minimum additional radiographs in 6-8 weeks to assess for resolution.   Recommended follow up: Return for as needed for new, worsening, persistent symptoms.f

## 2022-07-06 LAB — ANGIOTENSIN CONVERTING ENZYME: Angiotensin-Converting Enzyme: 23 U/L (ref 9–67)

## 2022-07-26 DIAGNOSIS — D860 Sarcoidosis of lung: Secondary | ICD-10-CM | POA: Diagnosis not present

## 2022-07-27 ENCOUNTER — Encounter: Payer: Self-pay | Admitting: Family Medicine

## 2022-07-27 ENCOUNTER — Ambulatory Visit (INDEPENDENT_AMBULATORY_CARE_PROVIDER_SITE_OTHER): Payer: Medicare Other | Admitting: Family Medicine

## 2022-07-27 VITALS — BP 128/74 | HR 82 | Temp 98.0°F | Resp 16 | Ht 68.0 in | Wt 167.0 lb

## 2022-07-27 DIAGNOSIS — R739 Hyperglycemia, unspecified: Secondary | ICD-10-CM

## 2022-07-27 DIAGNOSIS — T380X5A Adverse effect of glucocorticoids and synthetic analogues, initial encounter: Secondary | ICD-10-CM | POA: Diagnosis not present

## 2022-07-27 DIAGNOSIS — D860 Sarcoidosis of lung: Secondary | ICD-10-CM

## 2022-07-27 DIAGNOSIS — Z0000 Encounter for general adult medical examination without abnormal findings: Secondary | ICD-10-CM | POA: Diagnosis not present

## 2022-07-27 DIAGNOSIS — E785 Hyperlipidemia, unspecified: Secondary | ICD-10-CM

## 2022-07-27 DIAGNOSIS — Z66 Do not resuscitate: Secondary | ICD-10-CM | POA: Insufficient documentation

## 2022-07-27 DIAGNOSIS — M810 Age-related osteoporosis without current pathological fracture: Secondary | ICD-10-CM | POA: Diagnosis not present

## 2022-07-27 LAB — CBC WITH DIFFERENTIAL/PLATELET
Basophils Absolute: 0 10*3/uL (ref 0.0–0.1)
Basophils Relative: 0.4 % (ref 0.0–3.0)
Eosinophils Absolute: 0 10*3/uL (ref 0.0–0.7)
Eosinophils Relative: 0.3 % (ref 0.0–5.0)
HCT: 44.2 % (ref 36.0–46.0)
Hemoglobin: 14.4 g/dL (ref 12.0–15.0)
Lymphocytes Relative: 12.2 % (ref 12.0–46.0)
Lymphs Abs: 1.3 10*3/uL (ref 0.7–4.0)
MCHC: 32.6 g/dL (ref 30.0–36.0)
MCV: 91.1 fl (ref 78.0–100.0)
Monocytes Absolute: 0.6 10*3/uL (ref 0.1–1.0)
Monocytes Relative: 5.8 % (ref 3.0–12.0)
Neutro Abs: 8.6 10*3/uL — ABNORMAL HIGH (ref 1.4–7.7)
Neutrophils Relative %: 81.3 % — ABNORMAL HIGH (ref 43.0–77.0)
Platelets: 334 10*3/uL (ref 150.0–400.0)
RBC: 4.85 Mil/uL (ref 3.87–5.11)
RDW: 14.5 % (ref 11.5–15.5)
WBC: 10.6 10*3/uL — ABNORMAL HIGH (ref 4.0–10.5)

## 2022-07-27 LAB — COMPREHENSIVE METABOLIC PANEL
ALT: 16 U/L (ref 0–35)
AST: 17 U/L (ref 0–37)
Albumin: 4 g/dL (ref 3.5–5.2)
Alkaline Phosphatase: 78 U/L (ref 39–117)
BUN: 19 mg/dL (ref 6–23)
CO2: 29 mEq/L (ref 19–32)
Calcium: 9.7 mg/dL (ref 8.4–10.5)
Chloride: 101 mEq/L (ref 96–112)
Creatinine, Ser: 0.71 mg/dL (ref 0.40–1.20)
GFR: 79.43 mL/min (ref >60.00)
Glucose, Bld: 92 mg/dL (ref 70–99)
Potassium: 4.3 mEq/L (ref 3.5–5.1)
Sodium: 140 mEq/L (ref 135–145)
Total Bilirubin: 0.7 mg/dL (ref 0.2–1.2)
Total Protein: 6.9 g/dL (ref 6.0–8.3)

## 2022-07-27 LAB — LIPID PANEL
Cholesterol: 170 mg/dL (ref 0–200)
HDL: 49.3 mg/dL (ref >39.00)
LDL Cholesterol: 85 mg/dL (ref 0–99)
NonHDL: 121.03
Total CHOL/HDL Ratio: 3
Triglycerides: 178 mg/dL — ABNORMAL HIGH (ref 0.0–149.0)
VLDL: 35.6 mg/dL (ref 0.0–40.0)

## 2022-07-27 LAB — HEMOGLOBIN A1C: Hgb A1c MFr Bld: 6.5 % (ref 4.6–6.5)

## 2022-07-27 LAB — VITAMIN D 25 HYDROXY (VIT D DEFICIENCY, FRACTURES): VITD: 47.41 ng/mL (ref 30.00–100.00)

## 2022-07-27 NOTE — Patient Instructions (Addendum)
Insomnia issues noted- will trial half of half melatonin 5 mg (basically a quarter) so 1.25 mg -if not improving  we discussed trial of trazodone 50 mg- start with half tablet- they will reach out by mychart- I will send in without visit  Consider updated Covid and RSV shot at pharmacy -also shingrix at pharmacy -also due for Tdap  Please stop by lab before you go If you have mychart- we will send your results within 3 business days of [place] receiving them.  If you do not have mychart- we will call you about results within 5 business days of [place] receiving them.  *please also note that you will see labs on mychart as soon as they post. I will later go in and write notes on them- will say "notes from Dr. Yong Channel"   Recommended follow up: Return in about 6 months (around 01/25/2023) for followup or sooner if needed.Schedule b4 you leave.

## 2022-07-27 NOTE — Progress Notes (Signed)
Phone [phone]   Subjective:  Patient presents today for their annual physical. Chief complaint-noted.   See problem oriented charting- ROS- full  review of systems was completed and negative except for: some fatigue after pneumonia.   The following were reviewed and entered/updated in epic: Past Medical History:  Diagnosis Date   Adenomatous colon polyp    sessile   Allergy    SEASONAL   Anxiety    Cancer ([place])    Skin cancer   Cataract    bilateral   Colon polyps    Diverticulosis of colon (without mention of hemorrhage)    DJD (degenerative joint disease)    Dysrhythmia    pac   Esophagitis    Essential hypertension 06/04/2017   GERD (gastroesophageal reflux disease)    Hiatal hernia    Hypercholesterolemia    IBS (irritable bowel syndrome)    MVP (mitral valve prolapse)    Osteopenia    Personal history of colonic polyps    SESSILE SERRATED ADENOMAS (X2)   PONV (postoperative nausea and vomiting)    Sarcoidosis    Shortness of breath dyspnea    Stricture and stenosis of esophagus    Unspecified gastritis and gastroduodenitis without mention of hemorrhage    Urinary tract infection, site not specified    Uveitis    Patient Active Problem List   Diagnosis Date Noted   Steroid-induced hyperglycemia 10/18/2016    Priority: High   Sarcoidosis 11/21/2011    Priority: High   Essential hypertension 06/04/2017    Priority: Medium    Hyperlipidemia 10/18/2016    Priority: Medium    Female bladder prolapse 03/22/2015    Priority: Medium    Osteoporosis 12/06/2014    Priority: Medium    Dyspnea 08/20/2013    Priority: Medium    GERD with stricture 10/12/2011    Priority: Medium    Uveitis 09/21/2007    Priority: Medium    Recurrent UTI 09/09/2007    Priority: Medium    Anxiety state 08/06/2007    Priority: Medium    History of colonic polyps 08/06/2007    Priority: Medium    History of basal cell cancer 10/18/2016    Priority: Low   Compression  fx, lumbar spine ([place]) 09/24/2012    Priority: Low   PVD (posterior vitreous detachment), both eyes 05/17/2011    Priority: Low   Osteoarthritis 09/21/2007    Priority: Low   IBS (irritable bowel syndrome) 08/06/2007    Priority: Low   LOW BACK PAIN, CHRONIC 08/06/2007    Priority: Low   Intermediate uveitis 07/03/2012   Epiretinal membrane (ERM) of both eyes 05/17/2011   Past Surgical History:  Procedure Laterality Date   BASAL CELL CARCINOMA EXCISION     CATARACT EXTRACTION Bilateral    COLONOSCOPY  10-13-10   MASS EXCISION Left 11/02/2014   Procedure: EXCISION MASS LEFT ELBOW;  Surgeon: Daryll Brod, MD;  Location: [facility];  Service: Orthopedics;  Laterality: Left;   POLYPECTOMY     UPPER GASTROINTESTINAL ENDOSCOPY     VESICOVAGINAL FISTULA CLOSURE W/ TAH     "sling" after hystrectomy per patient    Family History  Problem Relation Age of Onset   Melanoma Mother        per pt melanoma    Colon cancer Sister        sees Dr Henrene Pastor    Congestive Heart Failure Sister        died in October 13, 2019 at age  82-03-22   Endometrial cancer Sister    Colon polyps Brother    Sarcoidosis Brother    Congestive Heart Failure Brother        died at 97   Colon polyps Sister        Dr stark    Stroke Sister        died in 2019/10/12 at age 36   Colon polyps Sister    Pneumonia Sister        died in her 78s   Lung cancer Brother    Other Other        nephew with Wegener's    Esophageal cancer Other    Osteoporosis Neg Hx    Rectal cancer Neg Hx    Stomach cancer Neg Hx     Medications- reviewed and updated Current Outpatient Medications  Medication Sig Dispense Refill   amLODipine (NORVASC) 2.5 MG tablet      B Complex Vitamins (VITAMIN-B COMPLEX PO) Take 1 capsule by mouth daily.     Calcium Carbonate (CALTRATE 600 PO) Take 1 tablet by mouth 2 (two) times daily.     cholecalciferol (VITAMIN D) 1000 units tablet Take 1,000 Units by mouth daily.     esomeprazole (NEXIUM) 40 MG capsule  TAKE ONE CAPSULE BY MOUTH TWICE DAILY BEFORE A MEAL (Patient taking differently: 20 mg daily.) 180 capsule 1   Glucosamine HCl (GLUCOSAMINE PO) Liquid form - Take by mouth as directed once daily     ketoconazole (NIZORAL) 2 % cream APPLY TOPICALLY TO THE AFFECTED AREA TWICE DAILY     meclizine (ANTIVERT) 25 MG tablet Take 1/2-1 tablet by mouth every 4 hours as needed for dizziness     metoprolol tartrate (LOPRESSOR) 25 MG tablet Take 1 tablet (25 mg total) by mouth at bedtime. 30 tablet 0   metroNIDAZOLE (METROGEL) 0.75 % gel as needed.     Multiple Vitamins-Minerals (MULTIVITAL) tablet Take 1 tablet by mouth daily. 2- 3 times a week     Omega-3 Fatty Acids (FISH OIL) 1000 MG CAPS Take 1 capsule by mouth daily.     ondansetron (ZOFRAN-ODT) 4 MG disintegrating tablet Take 1 tablet (4 mg total) by mouth every 8 (eight) hours as needed for nausea (with vertigo). 30 tablet 1   predniSONE (DELTASONE) 5 MG tablet TAKE 1 TABLET BY MOUTH DAILY WITH BREAKFAST 90 tablet 3   Probiotic Product (ALIGN PO) Take by mouth daily.     simvastatin (ZOCOR) 20 MG tablet TAKE 1 TABLET(20 MG) BY MOUTH AT BEDTIME 90 tablet 3   sucralfate (CARAFATE) 1 g tablet TAKE 1 TABLET(1 GRAM) BY MOUTH TWICE DAILY AS NEEDED 180 tablet 3   SYSTANE ULTRA 0.4-0.3 % SOLN Apply 1 drop to eye 2 (two) times daily.     tacrolimus (PROTOPIC) 0.1 % ointment Apply topically 2 (two) times daily.     triamcinolone ointment (KENALOG) 0.1 % APPLY THIN LAYER TOPICALLY TO THE AFFECTED AREA TWICE DAILY     No current facility-administered medications for this visit.    Allergies-reviewed and updated Allergies  Allergen Reactions   Codeine Nausea Only and Other (See Comments)    REACTION: nausea   Cortisone Other (See Comments)   Nitrofurantoin Nausea Only    Significant and had to stop after 2-3 doses    Sertraline     Headaches, hot flashes, stomach issues, reflux, tinnitus- improved when she stopped. Occurred even on 12.'5mg'$  less than 25  mg.    Tape Rash and Other (  See Comments)    Blisters skin and removes skin --NEEDS PAPER TAPE    Social History   Social History Narrative   Married- husband with parkinsons with dementia- moving to memory care      Retired Artist (until 2017), bookkeeping prior      Hobbies: cooking, florist work, Barrister's clerk   Objective  Objective:  BP 128/74 (BP Location: Right Arm, Patient Position: Sitting, Cuff Size: Normal)   Pulse 82   Temp 98 F (36.7 C) (Oral)   Resp 16   Ht '5\' 8"'$  (1.727 m)   Wt 167 lb (75.8 kg)   SpO2 95%   BMI 25.39 kg/m  Gen: NAD, resting comfortably HEENT: Mucous membranes are moist. Oropharynx normal Neck: no thyromegaly CV: RRR no murmurs rubs or gallops Lungs: CTAB no crackles, wheeze, rhonchi Abdomen: soft/nontender/nondistended/normal bowel sounds. No rebound or guarding.  Ext: no edema Skin: warm, dry Neuro: grossly normal, moves all extremities, PERRLA   Assessment and Plan   82 y.o. female presenting for annual physical.  Health Maintenance counseling: 1. Anticipatory guidance: Patient counseled regarding regular dental exams -q6 months, eye exams-regular visits with regular eye doctor in [place] now including Dr. Manuella Ghazi retinal specialist,  avoiding smoking and second hand smoke, limiting alcohol to 1 beverage per day- not at all at present , no illicit drugs .   2. Risk factor reduction:  Advised patient of need for regular exercise and diet rich and fruits and vegetables to reduce risk of heart attack and stroke.  Exercise- still limited by caregiving- plans to try to reengage by working with PT on site- will message me with any forms we need to help with.  Diet/weight management-weight largely stable- gets meals through facility- reasonably healthy- and still cooks some.  Wt Readings from Last 3 Encounters:  07/27/22 167 lb (75.8 kg)  07/05/22 165 lb (74.8 kg)  06/22/22 163 lb 12.8 oz (74.3 kg)  3. Immunizations/screenings/ancillary  studies-COVID-19 vaccination - recommend considering at pharmacy, consider prevnar 20 five years out, RSV shot considering Immunization History  Administered Date(s) Administered   Fluad Quad(high Dose 65+) 04/29/2020, 05/06/2022   Influenza Split 05/23/2011, 05/23/2012, 05/04/2013, 05/31/2014   Influenza Whole 09/11/2009, 05/08/2010   Influenza, High Dose Seasonal PF 05/02/2016, 05/08/2017, 04/25/2019   Influenza-Unspecified 05/25/2015, 04/08/2018, 04/24/2021   Moderna Sars-Covid-2 Vaccination 08/19/2019, 09/16/2019, 06/06/2020   Pneumococcal Conjugate-13 05/27/2020   Pneumococcal Polysaccharide-23 05/17/2010   Tdap 11/21/2011   4. Cervical cancer screening- past age based screening recommendations. Still gets pelvic examinations with GYN Dr. Marvel Plan   5. Breast cancer screening-  breast exam with GYN and mammogram - formally past age based screening recommendations but received these with GYN    6. Colon cancer screening - 02/19/2019 with 3 year repeat due to polpy and sister with colon cancer history but wasn't covered over age 30 on her insurance.  Also had endoscopy.  7. Skin cancer screening- Dr. Melanee Left in [place] with >35 years once a year  for  dermatology. advised regular sunscreen use. Denies worrisome, changing, or new skin lesions.  8. Birth control/STD check- monogamous and postmenopausal 9. Osteoporosis screening at 24- was referred to endocrinology given evaluated fracture risk and long-term predinisone. Dr. Loanne Drilling recommended Prolia in 2018-  she opted out - also after bone density worsened last year- discussed reclast- with all she has on her plate -she preferred to talk about it at future visit-today wants to hold off on any decisions until things settle from pneumonia and decides  on final plan for living situation for husband. Consider next visit  -fall prevention key -never smoker  Status of chronic or acute concerns   #sarcoidosis- working with Dr. Claiborne Billings in [place]  [facility]- remains on prednisone - prior pneumonia cleared on x-ray yesterday and Dr. Claiborne Billings said no further eval/CT needed with improvement- thought more likely pneumonia then sarcoid related  # Anxiety/sleep issues S:Medication:  none, prior trials listed last visit for anxiety - has tried tylenol PM (half dose) and melatonin (feels dizzy and nausea at 2.5) -buspirone even half dose with side effects  Stressors: looking to transition husband to memory care- she has better support at night.  -Onset of anxiety around the time of significant accident November 2022 A/P: Insomnia issues noted- will trial half of half melatonin 5 mg (basically a quarter) so 1.25 mg -if not improving  we discussed trial of trazodone 50 mg- start with half tablet- they will reach out by mychart- I will send in without visit  #Steroid-induced hyperglycemia S: Medication:Related to long-term prednisone use Lab Results  Component Value Date   HGBA1C 6.4 02/09/2022   HGBA1C 6.3 03/17/2021   HGBA1C 6.3 08/31/2020  A/P: update a1c with labs to trend  #Osteoporosis-likely related to long-term prednisone use-  Osteoporosis screening at 59- was referred to endocrinology given evaluated fracture risk and long-term predinisone. Dr. Loanne Drilling recommended Prolia in 2018-  she opted out - also after bone density worsened last year- discussed reclast- with all she has on her plate - we opted to discuss again next visit- she defers again today  #hypertension S: medication: amlodipine 2.5 mg, metoprolol 25 mg-only as needed if has palpitations BP Readings from Last 3 Encounters:  07/27/22 128/74  07/05/22 110/60  06/22/22 136/66  A/P: stable- continue current medicines   #hyperlipidemia S: Medication:Simvastatin 20 mg  Lab Results  Component Value Date   CHOL 152 03/17/2021   HDL 47.10 03/17/2021   LDLCALC 74 03/17/2021   LDLDIRECT 91.0 02/09/2022   TRIG 153.0 (H) 03/17/2021   CHOLHDL 3 03/17/2021  A/P:  hopefully stable- update lipid panel today. Continue current meds for now  # GERD S:Medication:   nexium 40 mg, carafate A/P: reasonable control- continue current medications     Recommended follow up: No follow-ups on file. Future Appointments  Date Time Provider [place]  09/20/2022 11:00 AM [facility] HEALTH COACH [facility]   Lab/Order associations:NOT fasting   ICD-10-CM   1. Preventative health care  Z00.00     2. Steroid-induced hyperglycemia  R73.9 HgB A1c   T38.0X5A     3. Age-related osteoporosis without current pathological fracture  M81.0 VITAMIN D 25 Hydroxy (Vit-D Deficiency, Fractures)    4. Hyperlipidemia, unspecified hyperlipidemia type  E78.5 CBC with Differential/Platelet    Comprehensive metabolic panel    Lipid panel    5. Pulmonary sarcoidosis ([place]) Chronic D86.0       No orders of the defined types were placed in this encounter.   Return precautions advised.  Garret Reddish, MD

## 2022-08-23 ENCOUNTER — Telehealth: Payer: Self-pay | Admitting: Family Medicine

## 2022-08-23 NOTE — Telephone Encounter (Signed)
Patient states:  - Experiencing dizziness,headaches, nausea and fatigued.   Patient was transferred to triage nurse via Laura [name]. Final disposition was to be seen by PCP within 3 days. Please Advise.   Patient Name: Laura [name] Gender: Female DOB: Sep 11, 1940 Age: 82 Y 21 D Return Phone Number: [phone] (Primary), [phone] (Secondary) Address: City/ State/ Zip: [place] [address] Client [place] [facility] [place] [facility] [place] [facility] [place] [facility] Provider Laura [name]- MD Contact Type Call Who Is Calling Patient / Member / Family / Caregiver Call Type Triage / Clinical Relationship To Patient Self Return Phone Number [phone] (Primary) Chief Complaint Dizziness Reason for Call Symptomatic / Request for [place] states she is dizzy, headaches, nausea, and fatigued. Translation No Nurse Assessment Nurse: Laura Curt, RN, Laura [name] Date/Time (Eastern Time): 08/23/2022 11:23:28 AM Confirm and document reason for call. If symptomatic, describe symptoms. ---Caller states has dizzy, headaches, nausea - intermittently, and extreme fatigued. Appt on Monday morning. 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 November with PNA and UTI, cleared in January. Feels stuck in recovery. Not sleeping well, up to the bathroom, can'[name] go back to sleep. Does the patient have any new or worsening symptoms? ---Yes Will a triage be completed? ---Yes Related visit to physician within the last 2 weeks? ---No Does the PT have any chronic conditions? (i.e. diabetes, asthma, this includes High risk factors for pregnancy, etc.) ---Yes Is this a behavioral health or substance abuse call? ---No Guidelines Guideline Title Affirmed Question Affirmed Notes Nurse Date/Time (Eastern Time) Anxiety and Panic Attack MODERATE anxiety (e.g., persistent or frequent  anxiety symptoms; interferes with sleep, school, or work) Designer, fashion/clothing, Therapist, sports, Threasa Beards 08/23/2022 11:30:34 AM PLEASE NOTE: All timestamps contained within this report are represented as [place] Standard Time. CONFIDENTIALTY NOTICE: This fax transmission is intended only for the addressee. It contains information that is legally privileged, confidential or otherwise protected from use or disclosure. If you are not the intended recipient, you are strictly prohibited from reviewing, disclosing, copying using or disseminating any of this information or taking any action in reliance on or regarding this information. If you have received this fax in error, please notify us immediately by telephone so that we can arrange for its return to [place]. Phone: [phone], Toll-Free: [phone], Fax: [phone] Page: 2 of 2 Call Id: [id number] [place]. Time Eilene Ghazi Time) Disposition Final User 08/23/2022 11:32:25 AM SEE PCP WITHIN 3 DAYS Yes Laura Curt, RN, Laura [name] Final Disposition 08/23/2022 11:32:25 AM SEE PCP WITHIN 3 DAYS Yes Laura Curt, RN, Threasa Beards Caller Disagree/Comply Comply Caller Understands Yes PreDisposition Call Doctor Care Advice Given Per Guideline SEE PCP WITHIN 3 DAYS: * You need to be seen within 2 or 3 days. * PCP VISIT: Call your doctor (or NP/PA) during regular office hours and make an appointment. A clinic or urgent care center are good places to go for care if your doctor's office is closed or you can'[name] get an appointment. NOTE: If office will be open tomorrow, tell caller to call then, not in 3 days. CALL BACK IF: * You feel like harming yourself * You become worse CARE ADVICE given per Anxiety and Panic Attack (Adult) guideline.

## 2022-08-23 NOTE — Telephone Encounter (Signed)
Pt already has an appointment scheduled for Monday.

## 2022-08-27 ENCOUNTER — Ambulatory Visit: Payer: Medicare Other | Admitting: Family Medicine

## 2022-09-06 ENCOUNTER — Ambulatory Visit: Payer: Medicare Other | Admitting: Family Medicine

## 2022-09-17 ENCOUNTER — Telehealth: Payer: Self-pay | Admitting: Family Medicine

## 2022-09-17 NOTE — Telephone Encounter (Signed)
Patient states: - She tested positive for covid on 02/24 - Currently has sore throat, sinus drainage, and cough  - Unable to do virtual due to husband being in hospital and children being with him   Please Advise.

## 2022-09-17 NOTE — Telephone Encounter (Signed)
Not sure if insurance covers but could set up as phone visit if she would like- I assume she cannot make it into office (I'm fine seeing her in office if we wear n95)

## 2022-09-17 NOTE — Telephone Encounter (Signed)
See below

## 2022-09-18 DIAGNOSIS — U071 COVID-19: Secondary | ICD-10-CM | POA: Diagnosis not present

## 2022-09-18 DIAGNOSIS — R059 Cough, unspecified: Secondary | ICD-10-CM | POA: Diagnosis not present

## 2022-09-18 DIAGNOSIS — R918 Other nonspecific abnormal finding of lung field: Secondary | ICD-10-CM | POA: Diagnosis not present

## 2022-09-19 NOTE — Telephone Encounter (Signed)
Patient went to UC on 2/27- patient has been given doxycycline. Patient is still not feeling 100%. Patient stated she will be calling office should she not feel any better. Patient ok if dr hunter wants to change medications. Unable to come in at the moment.

## 2022-09-24 DIAGNOSIS — H35372 Puckering of macula, left eye: Secondary | ICD-10-CM | POA: Diagnosis not present

## 2022-09-24 DIAGNOSIS — Z961 Presence of intraocular lens: Secondary | ICD-10-CM | POA: Diagnosis not present

## 2022-09-24 DIAGNOSIS — H30033 Focal chorioretinal inflammation, peripheral, bilateral: Secondary | ICD-10-CM | POA: Diagnosis not present

## 2022-09-24 DIAGNOSIS — H35341 Macular cyst, hole, or pseudohole, right eye: Secondary | ICD-10-CM | POA: Diagnosis not present

## 2022-09-24 DIAGNOSIS — H43813 Vitreous degeneration, bilateral: Secondary | ICD-10-CM | POA: Diagnosis not present

## 2022-09-26 DIAGNOSIS — H209 Unspecified iridocyclitis: Secondary | ICD-10-CM | POA: Diagnosis not present

## 2022-09-26 DIAGNOSIS — U071 COVID-19: Secondary | ICD-10-CM | POA: Diagnosis not present

## 2022-09-26 DIAGNOSIS — D860 Sarcoidosis of lung: Secondary | ICD-10-CM | POA: Diagnosis not present

## 2022-10-02 DIAGNOSIS — D860 Sarcoidosis of lung: Secondary | ICD-10-CM | POA: Diagnosis not present

## 2022-10-02 DIAGNOSIS — J948 Other specified pleural conditions: Secondary | ICD-10-CM | POA: Diagnosis not present

## 2022-10-02 DIAGNOSIS — U071 COVID-19: Secondary | ICD-10-CM | POA: Diagnosis not present

## 2022-10-02 DIAGNOSIS — U099 Post covid-19 condition, unspecified: Secondary | ICD-10-CM | POA: Diagnosis not present

## 2022-10-10 DIAGNOSIS — N8110 Cystocele, unspecified: Secondary | ICD-10-CM | POA: Diagnosis not present

## 2022-11-01 DIAGNOSIS — R509 Fever, unspecified: Secondary | ICD-10-CM | POA: Diagnosis not present

## 2022-11-01 DIAGNOSIS — R52 Pain, unspecified: Secondary | ICD-10-CM | POA: Diagnosis not present

## 2022-11-01 DIAGNOSIS — J069 Acute upper respiratory infection, unspecified: Secondary | ICD-10-CM | POA: Diagnosis not present

## 2022-11-05 ENCOUNTER — Telehealth: Payer: Self-pay | Admitting: Family Medicine

## 2022-11-05 NOTE — Telephone Encounter (Signed)
Copied from CRM [id number]. Topic: Medicare AWV >> Nov 05, 2022 11:57 AM Gwenith Spitz wrote: Reason for CRM: Called patient to schedule Medicare Annual Wellness Visit (AWV). Left message for patient to call back and schedule Medicare Annual Wellness Visit (AWV).  Last date of AWV: 09/08/2021  Please schedule an appointment at any time with Inetta Fermo, [facility]. Please schedule AWVS with Inetta Fermo, [facility]..  If any questions, please contact me at [phone].  Thank you ,  Gabriel Cirri [facility] AWV TEAM Direct Dial [phone]

## 2022-11-12 ENCOUNTER — Encounter: Payer: Self-pay | Admitting: Family Medicine

## 2022-11-19 DIAGNOSIS — L578 Other skin changes due to chronic exposure to nonionizing radiation: Secondary | ICD-10-CM | POA: Diagnosis not present

## 2022-11-19 DIAGNOSIS — L821 Other seborrheic keratosis: Secondary | ICD-10-CM | POA: Diagnosis not present

## 2022-11-19 DIAGNOSIS — D1801 Hemangioma of skin and subcutaneous tissue: Secondary | ICD-10-CM | POA: Diagnosis not present

## 2022-11-19 DIAGNOSIS — Z08 Encounter for follow-up examination after completed treatment for malignant neoplasm: Secondary | ICD-10-CM | POA: Diagnosis not present

## 2022-11-19 DIAGNOSIS — Z85828 Personal history of other malignant neoplasm of skin: Secondary | ICD-10-CM | POA: Diagnosis not present

## 2022-11-29 DIAGNOSIS — J069 Acute upper respiratory infection, unspecified: Secondary | ICD-10-CM | POA: Diagnosis not present

## 2022-11-29 DIAGNOSIS — R509 Fever, unspecified: Secondary | ICD-10-CM | POA: Diagnosis not present

## 2022-12-02 DIAGNOSIS — K5720 Diverticulitis of large intestine with perforation and abscess without bleeding: Secondary | ICD-10-CM | POA: Diagnosis not present

## 2022-12-02 DIAGNOSIS — I4891 Unspecified atrial fibrillation: Secondary | ICD-10-CM | POA: Diagnosis not present

## 2022-12-02 DIAGNOSIS — E785 Hyperlipidemia, unspecified: Secondary | ICD-10-CM | POA: Diagnosis not present

## 2022-12-02 DIAGNOSIS — I068 Other rheumatic aortic valve diseases: Secondary | ICD-10-CM | POA: Diagnosis not present

## 2022-12-02 DIAGNOSIS — I700 Atherosclerosis of aorta: Secondary | ICD-10-CM | POA: Diagnosis not present

## 2022-12-02 DIAGNOSIS — D860 Sarcoidosis of lung: Secondary | ICD-10-CM | POA: Diagnosis not present

## 2022-12-02 DIAGNOSIS — Z7952 Long term (current) use of systemic steroids: Secondary | ICD-10-CM | POA: Diagnosis not present

## 2022-12-02 DIAGNOSIS — I10 Essential (primary) hypertension: Secondary | ICD-10-CM | POA: Diagnosis not present

## 2022-12-02 DIAGNOSIS — Z79899 Other long term (current) drug therapy: Secondary | ICD-10-CM | POA: Diagnosis not present

## 2022-12-02 DIAGNOSIS — R0602 Shortness of breath: Secondary | ICD-10-CM | POA: Diagnosis not present

## 2022-12-02 DIAGNOSIS — K219 Gastro-esophageal reflux disease without esophagitis: Secondary | ICD-10-CM | POA: Diagnosis not present

## 2022-12-02 DIAGNOSIS — R1030 Lower abdominal pain, unspecified: Secondary | ICD-10-CM | POA: Diagnosis not present

## 2022-12-02 DIAGNOSIS — Z885 Allergy status to narcotic agent status: Secondary | ICD-10-CM | POA: Diagnosis not present

## 2022-12-02 DIAGNOSIS — E876 Hypokalemia: Secondary | ICD-10-CM | POA: Diagnosis not present

## 2022-12-02 DIAGNOSIS — I472 Ventricular tachycardia, unspecified: Secondary | ICD-10-CM | POA: Diagnosis not present

## 2022-12-02 DIAGNOSIS — K5730 Diverticulosis of large intestine without perforation or abscess without bleeding: Secondary | ICD-10-CM | POA: Diagnosis not present

## 2022-12-02 DIAGNOSIS — N8110 Cystocele, unspecified: Secondary | ICD-10-CM | POA: Diagnosis not present

## 2022-12-10 ENCOUNTER — Telehealth: Payer: Self-pay

## 2022-12-10 NOTE — Transitions of Care (Post Inpatient/ED Visit) (Signed)
12/10/2022  Name: Laura [name] MRN: [id number] DOB: [birth year]  Today's TOC FU Call Status: Today's TOC FU Call Status:: Successful TOC FU Call Competed TOC FU Call Complete Date: 12/10/22  Transition Care Management Follow-up Telephone Call Date of Discharge: 12/07/22 Discharge Facility: Other ([facility]) Name of Other ([facility]) Discharge Facility: [facility] Type of Discharge: Inpatient Admission Primary Inpatient Discharge Diagnosis:: "diverticulitis of large intestine w/ abscess without bleeding" How have you been since you were released from the hospital?: Better (Pt states she is doing beyter-GI sxs have resolved. Appetite fair. LBM was yesterday. She has new dx of A-fib and having some tiredness/weakness but improving.) Any questions or concerns?: No  Items Reviewed: Did you receive and understand the discharge instructions provided?: Yes Medications obtained,verified, and reconciled?: Yes (Medications Reviewed) Any new allergies since your discharge?: No Dietary orders reviewed?: Yes Type of Diet Ordered:: low salt/heart healthy Do you have support at home?: Yes People in Home: alone Name of Support/Comfort Primary Source: pt states she recently relocated to [place] in retirement community housing- spouse died about 6wks ago, daughter lives nearby and able to assist her  Medications Reviewed Today: Medications Reviewed Today     Reviewed by Charlyn Minerva, RN (Registered Nurse) on 12/10/22 at 1007  Med List Status: <None>   Medication Order Taking? Sig Documenting Provider Last Dose Status Informant  amLODipine (NORVASC) 2.5 MG tablet [id number] Yes Pt taking 5mg  tablet daily [provider] Taking Active Self  amoxicillin-clavulanate (AUGMENTIN) 875-125 MG tablet [id number] Yes Take 1 tablet by mouth 2 (two) times daily. [provider] Taking Active Self  apixaban (ELIQUIS) 5 MG TABS tablet [id number] Yes Take 5 mg by mouth  2 (two) times daily. [provider] Taking Active Self  B Complex Vitamins (VITAMIN-B COMPLEX PO) [id number] Yes Take 1 capsule by mouth daily. [provider] Taking Active   Calcium Carbonate (CALTRATE 600 PO) [id number] Yes Take 1 tablet by mouth 2 (two) times daily. [provider] Taking Active   cholecalciferol (VITAMIN D) 1000 units tablet [id number] Yes Take 1,000 Units by mouth daily. [provider] Taking Active   esomeprazole (NEXIUM) 40 MG capsule [id number] Yes TAKE ONE CAPSULE BY MOUTH TWICE DAILY BEFORE A MEAL  Patient taking differently: 20 mg daily. Taking two times a day   Pyrtle, Carie Caddy, MD Taking Active Self  Glucosamine HCl (GLUCOSAMINE PO) [id number]  Liquid form - Take by mouth as directed once daily [provider]  Active   ketoconazole (NIZORAL) 2 % cream [id number]  APPLY TOPICALLY TO THE AFFECTED AREA TWICE DAILY [provider]  Active   meclizine (ANTIVERT) 25 MG tablet [id number]  Take 1/2-1 tablet by mouth every 4 hours as needed for dizziness Michele Mcalpine, MD  Active   metoprolol tartrate (LOPRESSOR) 25 MG tablet [id number] Yes Take 1 tablet (25 mg total) by mouth at bedtime.  Patient taking differently: Take 25 mg by mouth at bedtime. Taking half tablet(12.5mg  dose) by mouth two times a day   Joycelyn Man M, [place] Taking Active Self  metroNIDAZOLE (METROGEL) 0.75 % gel [id number] Yes as needed. [provider] Taking Active   Multiple Vitamins-Minerals (MULTIVITAL) tablet [id number] Yes Take 1 tablet by mouth daily. 2- 3 times a week [provider] Taking Active   Omega-3 Fatty Acids (FISH OIL) 1000 MG CAPS [id number] Yes Take 1 capsule by mouth daily. [provider] Taking Active   ondansetron (ZOFRAN-ODT) 4 MG disintegrating tablet [id number] Yes Take 1  tablet (4 mg total) by mouth every 8 (eight) hours as needed for nausea (with vertigo). Shelva Majestic, MD Taking Active   pravastatin  (PRAVACHOL) 40 MG tablet [id number] Yes Take 40 mg by mouth daily. [provider] Taking Active Self  predniSONE (DELTASONE) 5 MG tablet [id number] Yes TAKE 1 TABLET BY MOUTH DAILY WITH Carlena Hurl, MD Taking Active   Probiotic Product (ALIGN PO) [id number] Yes Take by mouth daily. [provider] Taking Active   simvastatin (ZOCOR) 20 MG tablet [id number] No TAKE 1 TABLET(20 MG) BY MOUTH AT BEDTIME  Patient not taking: Reported on 12/10/2022   Shelva Majestic, MD Not Taking Active   sucralfate (CARAFATE) 1 g tablet [id number] Yes TAKE 1 TABLET(1 GRAM) BY MOUTH TWICE DAILY AS NEEDED Shelva Majestic, MD Taking Active   SYSTANE ULTRA 0.4-0.3 % SOLN [id number] Yes Apply 1 drop to eye 2 (two) times daily. [provider] Taking Active   tacrolimus (PROTOPIC) 0.1 % ointment [id number] Yes Apply topically 2 (two) times daily. [provider] Taking Active   triamcinolone ointment (KENALOG) 0.1 % [id number] Yes APPLY THIN LAYER TOPICALLY TO THE AFFECTED AREA TWICE DAILY [provider] Taking Active             Home Care and Equipment/Supplies: Were Home Health Services Ordered?: NA Any new equipment or medical supplies ordered?: NA  Functional Questionnaire: Do you need assistance with bathing/showering or dressing?: No Do you need assistance with meal preparation?: No Do you need assistance with eating?: No Do you have difficulty maintaining continence: No Do you need assistance with getting out of bed/getting out of a chair/moving?: No Do you have difficulty managing or taking your medications?: No  Follow up appointments reviewed: PCP Follow-up appointment confirmed?: Yes Date of PCP follow-up appointment?: 12/18/22 (Pt wants to complete hosp f/u appt with current PCP and is in the process of trying to find a new PCP in her new area) Follow-up Provider: Dr. Durene Cal Specialist [facility] Follow-up appointment confirmed?: No Reason  Specialist Follow-Up Not Confirmed: Patient has Specialist Provider Number and will Call for Appointment (pt has contact info to call and make follow up appt with cardiology) Do you need transportation to your follow-up appointment?: No Do you understand care options if your condition(s) worsen?: Yes-patient verbalized understanding  SDOH Interventions Today    Flowsheet Row Most Recent Value  SDOH Interventions   Food Insecurity Interventions Intervention Not Indicated  Transportation Interventions Intervention Not Indicated      TOC Interventions Today    Flowsheet Row Most Recent Value  TOC Interventions   TOC Interventions Discussed/Reviewed TOC Interventions Discussed      Interventions Today    Flowsheet Row Most Recent Value  Chronic Disease   Chronic disease during today's visit Atrial Fibrillation (AFib)  General Interventions   General Interventions Discussed/Reviewed General Interventions Discussed, Doctor Visits  Doctor Visits Discussed/Reviewed PCP, Doctor Visits Discussed, Specialist  PCP/Specialist Visits Compliance with follow-up visit  Education Interventions   Education Provided Provided Education  Provided Verbal Education On Nutrition, When to see the doctor, Medication, Other  Nutrition Interventions   Nutrition Discussed/Reviewed Nutrition Discussed, Adding fruits and vegetables, Decreasing salt  Pharmacy Interventions   Pharmacy Dicussed/Reviewed Pharmacy Topics Discussed, Medications and their functions  Safety Interventions   Safety Discussed/Reviewed Safety Discussed       Alessandra Grout [facility] Care Management Care Management Community Coordinator Direct Phone: [phone] Toll Free: [phone] Fax: [phone]

## 2022-12-18 ENCOUNTER — Encounter: Payer: Self-pay | Admitting: Family Medicine

## 2022-12-18 ENCOUNTER — Ambulatory Visit (INDEPENDENT_AMBULATORY_CARE_PROVIDER_SITE_OTHER): Payer: Medicare Other | Admitting: Family Medicine

## 2022-12-18 VITALS — BP 110/62 | HR 77 | Temp 97.4°F | Ht 68.0 in | Wt 164.2 lb

## 2022-12-18 DIAGNOSIS — I10 Essential (primary) hypertension: Secondary | ICD-10-CM

## 2022-12-18 DIAGNOSIS — K5720 Diverticulitis of large intestine with perforation and abscess without bleeding: Secondary | ICD-10-CM

## 2022-12-18 DIAGNOSIS — I480 Paroxysmal atrial fibrillation: Secondary | ICD-10-CM | POA: Diagnosis not present

## 2022-12-18 DIAGNOSIS — J189 Pneumonia, unspecified organism: Secondary | ICD-10-CM | POA: Diagnosis not present

## 2022-12-18 LAB — CBC WITH DIFFERENTIAL/PLATELET
Basophils Absolute: 0 10*3/uL (ref 0.0–0.1)
Basophils Relative: 0.4 % (ref 0.0–3.0)
Eosinophils Absolute: 0.1 10*3/uL (ref 0.0–0.7)
Eosinophils Relative: 0.5 % (ref 0.0–5.0)
HCT: 45.9 % (ref 36.0–46.0)
Hemoglobin: 14.6 g/dL (ref 12.0–15.0)
Lymphocytes Relative: 17.6 % (ref 12.0–46.0)
Lymphs Abs: 1.8 10*3/uL (ref 0.7–4.0)
MCHC: 31.9 g/dL (ref 30.0–36.0)
MCV: 89.2 fl (ref 78.0–100.0)
Monocytes Absolute: 0.8 10*3/uL (ref 0.1–1.0)
Monocytes Relative: 7.9 % (ref 3.0–12.0)
Neutro Abs: 7.5 10*3/uL (ref 1.4–7.7)
Neutrophils Relative %: 73.6 % (ref 43.0–77.0)
Platelets: 347 10*3/uL (ref 150.0–400.0)
RBC: 5.14 Mil/uL — ABNORMAL HIGH (ref 3.87–5.11)
RDW: 16.1 % — ABNORMAL HIGH (ref 11.5–15.5)
WBC: 10.2 10*3/uL (ref 4.0–10.5)

## 2022-12-18 LAB — COMPREHENSIVE METABOLIC PANEL
ALT: 17 U/L (ref 0–35)
AST: 15 U/L (ref 0–37)
Albumin: 4 g/dL (ref 3.5–5.2)
Alkaline Phosphatase: 74 U/L (ref 39–117)
BUN: 16 mg/dL (ref 6–23)
CO2: 30 mEq/L (ref 19–32)
Calcium: 9.6 mg/dL (ref 8.4–10.5)
Chloride: 99 mEq/L (ref 96–112)
Creatinine, Ser: 0.69 mg/dL (ref 0.40–1.20)
GFR: 80.85 mL/min (ref >60.00)
Glucose, Bld: 111 mg/dL — ABNORMAL HIGH (ref 70–99)
Potassium: 3.9 mEq/L (ref 3.5–5.1)
Sodium: 139 mEq/L (ref 135–145)
Total Bilirubin: 0.8 mg/dL (ref 0.2–1.2)
Total Protein: 7.1 g/dL (ref 6.0–8.3)

## 2022-12-18 MED ORDER — METOPROLOL TARTRATE 25 MG PO TABS
12.5000 mg | ORAL_TABLET | Freq: Two times a day (BID) | ORAL | 0 refills | Status: AC
Start: 2022-12-18 — End: ?

## 2022-12-18 NOTE — Progress Notes (Signed)
Phone [phone]   Subjective:  Laura [name] is a 82 y.o. year old very pleasant female patient who presents for transitional care management and hospital follow up for diverticulitis cared for by [facility] in [place], [place]. Patient was hospitalized from 12/02/2022 to 12/07/2022. A TCM phone call was completed on 12/10/2022. Medical complexity moderate-please note information was available through Care Everywhere-unable to find true discharge summary and summarize as best as possible from multiple progress notes between cardiology and surgery team. -Please note we did not bill TCM visit as did not meet ongoing need for oversight or require complex care beyond hospital review  Patient presented to the hospital on 12/02/2022 and was diagnosed with diverticulitis-ultimately was determined to have an abscess of the sigmoid colon.  She also had complications including atrial fibrillation with RVR as well as multifocal pneumonia.  At discharge she was transitioned to oral antibiotics (had been on Zosyn inpatient) with Augmentin 1 tablet twice daily.  Surgical intervention was not recommended.  Her white blood cell count trended down from 13,000 down to 9.1 before discharge.  She complained of low-grade fevers and chills off and on for 2 days prior to admission and also lower abdominal pain.  She did have a lingering cough since February - For reflux- maintained on proton pump inhibitor (PPI) stomach acid reducer- she has follow up with Dr. Izola Price scheduled Thursday thinks [facility] digestive health  From CT scan 12/03/2022 including both CT angio pulmonary and CT abdomen pelvis "IMPRESSION: Findings involving proximal sigmoid colon favored to represent acute diverticulitis, with 4.4 cm gas-filled focus suspicious for colonic wall abscess. Mass with necrosis considered less likely. No free air to suggest perforation. Recommend surgical consultation. Findings concerning for pulmonary embolism in the right lung base.  Consider dedicated CTPA. Nonspecific nodular septal thickening in the right middle lobe can also be further characterized." -plans to schedule pulmonary visit  In regards to atrial fibrillation cardiology consulted with last note 12/07/2022-they noted sinus rhythm on 12/05/2022 but then developed atrial fibrillation with RVR up to 118 on 12/06/2022 EKG.  She was discharged on Eliquis 5 mg twice daily and metoprolol 12.5 mg twice a day for rate control.  They recommended cardiology follow-up in 2 to 3 weeks.  She did have an echocardiogram with hyperdynamic ejection fraction up to 70% but no regional wall motion abnormalities.  It was difficult at time of imaging to assess diastolic function due to atrial fibrillation.  Mild sclerosis of aortic valve without stenosis noted. -she has follow up on Monday with cardiology Dr. Andrey Campanile closer to home  For hyperlipidemia she was maintained on pravastatin   Blood pressure was maintained on amlodipine 2.5 mg daily (with new start of metoprolol 12.5 mg twice daily)  It was also noted on her CTA of the pulmonary system-no pulmonary embolism but findings suggestive of multifocal pneumonia-indeterminate left lung nodules possibly infectious/inflammatory-they recommended follow-up CT scan on a short-term basis (she is going of the try to get this done through pulmonary).  They were aware of her sarcoidosis of the lungs-she remains on chronic prednisone for sarcoidosis  Today she reports breathing and cough has improved including lingering cough from February. Abdominal pain has resolved. No fever or chills. Just finished antibiotic yesterday.   See problem oriented charting as well  Past Medical History-  Patient Active Problem List   Diagnosis Date Noted   Paroxysmal atrial fibrillation (HCC) 12/18/2022    Priority: High   Do not resuscitate 07/27/2022    Priority:  High   Steroid-induced hyperglycemia 10/18/2016    Priority: High   Sarcoidosis 11/21/2011     Priority: High   Essential hypertension 06/04/2017    Priority: Medium    Hyperlipidemia 10/18/2016    Priority: Medium    Female bladder prolapse 03/22/2015    Priority: Medium    Osteoporosis 12/06/2014    Priority: Medium    Dyspnea 08/20/2013    Priority: Medium    GERD with stricture 10/12/2011    Priority: Medium    Uveitis 09/21/2007    Priority: Medium    Recurrent UTI 09/09/2007    Priority: Medium    Anxiety state 08/06/2007    Priority: Medium    History of colonic polyps 08/06/2007    Priority: Medium    History of basal cell cancer 10/18/2016    Priority: Low   Compression fx, lumbar spine (HCC) 09/24/2012    Priority: Low   PVD (posterior vitreous detachment), both eyes 05/17/2011    Priority: Low   Osteoarthritis 09/21/2007    Priority: Low   IBS (irritable bowel syndrome) 08/06/2007    Priority: Low   LOW BACK PAIN, CHRONIC 08/06/2007    Priority: Low   Intermediate uveitis 07/03/2012   Epiretinal membrane (ERM) of both eyes 05/17/2011    Medications- reviewed and updated  A medical reconciliation was performed comparing current medicines to hospital discharge medications. Current Outpatient Medications  Medication Sig Dispense Refill   amLODipine (NORVASC) 5 MG tablet Take 5 mg by mouth daily.     apixaban (ELIQUIS) 5 MG TABS tablet Take 5 mg by mouth 2 (two) times daily.     Glucosamine HCl (GLUCOSAMINE PO) Liquid form - Take by mouth as directed once daily     Multiple Vitamins-Minerals (MULTIVITAL) tablet Take 1 tablet by mouth daily. 2- 3 times a week     Omega-3 Fatty Acids (FISH OIL) 1000 MG CAPS Take 1 capsule by mouth daily.     pravastatin (PRAVACHOL) 40 MG tablet Take 40 mg by mouth daily.     predniSONE (DELTASONE) 5 MG tablet TAKE 1 TABLET BY MOUTH DAILY WITH BREAKFAST 90 tablet 3   Probiotic Product (ALIGN PO) Take by mouth daily.     SYSTANE ULTRA 0.4-0.3 % SOLN Apply 1 drop to eye 2 (two) times daily.     tacrolimus (PROTOPIC) 0.1 %  ointment Apply topically 2 (two) times daily.     triamcinolone ointment (KENALOG) 0.1 % APPLY THIN LAYER TOPICALLY TO THE AFFECTED AREA TWICE DAILY     B Complex Vitamins (VITAMIN-B COMPLEX PO) Take 1 capsule by mouth daily.     Calcium Carbonate (CALTRATE 600 PO) Take 1 tablet by mouth 2 (two) times daily.     cholecalciferol (VITAMIN D) 1000 units tablet Take 1,000 Units by mouth daily.     esomeprazole (NEXIUM) 40 MG capsule TAKE ONE CAPSULE BY MOUTH TWICE DAILY BEFORE A MEAL (Patient taking differently: 20 mg daily. Taking two times a day) 180 capsule 1   metoprolol tartrate (LOPRESSOR) 25 MG tablet Take 0.5 tablets (12.5 mg total) by mouth 2 (two) times daily. 1 tablet 0   sucralfate (CARAFATE) 1 g tablet TAKE 1 TABLET(1 GRAM) BY MOUTH TWICE DAILY AS NEEDED (Patient not taking: Reported on 12/18/2022) 180 tablet 3   No current facility-administered medications for this visit.   Objective  Objective:  BP 110/62   Pulse 77   Temp (!) 97.4 F (36.3 C)   Ht 5\' 8"  (1.727 m)  Wt 164 lb 3.2 oz (74.5 kg)   SpO2 95%   BMI 24.97 kg/m  Gen: NAD, resting comfortably CV: irregularly irregular, faint systolic murmur Lungs: CTAB no crackles, wheeze, rhonchi Abdomen: soft/nontender/nondistended/normal bowel sounds. No rebound or guarding.  Ext: no edema Skin: warm, dry   Assessment and Plan:   # Social update-she plans to establish primary care closer to her home-she will update me when she reschedules this-may or may not keep July 15 visit with me.  We will certainly miss her-it has been an honor to care for her as well as her husband before his passing in March of this year  # Hospital follow-up for diverticulitis with abscess(did not require surgical intervention) and community-acquired pneumonia - Patient appears to have drastically improved after recent illness-denies abdominal pain or difficulty breathing or cough-she finished antibiotics yesterday -She has underlying sarcoidosis and  remains on chronic prednisone - Had incidental possible pulmonary nodules related to infection but needs close follow-up scan-patient plans to transition care closer to her home in [place] for primary care so she plans to discuss this imaging with her pulmonologist-she plans to call to schedule follow-up for that -She also has gastroenterology follow-up already scheduled near her home  # Atrial fibrillation-patient is now appropriately anticoagulated with Eliquis and rate controlled with metoprolol-she plans to establish with local cardiology in [place] to discuss possible cardioversion  # Hypertension-well-controlled on amlodipine 5 mg and metoprolol 12.5 mg twice daily  # Hyperlipidemia-controlled reasonably well for her age on pravastatin 40 mg and fish oil Lab Results  Component Value Date   CHOL 170 07/27/2022   HDL 49.30 07/27/2022   LDLCALC 85 07/27/2022   LDLDIRECT 91.0 02/09/2022   TRIG 178.0 (H) 07/27/2022   CHOLHDL 3 07/27/2022     Recommended follow up: Return for next already scheduled visit or sooner if needed. Future Appointments  Date Time Provider Department Center  02/04/2023  2:00 PM Shelva Majestic, MD [facility]    Lab/Order associations:   ICD-10-CM   1. Diverticulitis of large intestine with abscess, unspecified bleeding status  K57.20     2. Community acquired pneumonia, unspecified laterality  J18.9     3. Paroxysmal atrial fibrillation (HCC)  I48.0 metoprolol tartrate (LOPRESSOR) 25 MG tablet    Comprehensive metabolic panel    CBC with Differential/Platelet    4. Essential hypertension  I10 Comprehensive metabolic panel    CBC with Differential/Platelet      Meds ordered this encounter  Medications   metoprolol tartrate (LOPRESSOR) 25 MG tablet    Sig: Take 0.5 tablets (12.5 mg total) by mouth 2 (two) times daily.    Dispense:  1 tablet    Refill:  0    Time Spent: 44 minutes of total time (8:40 AM-9:17 AM, 12:51-12:58 PM) was spent on  the date of the encounter performing the following actions: chart review prior to seeing the patient including review of hospital records, obtaining history, performing a medically necessary exam, counseling on the treatment plan as well as importance of scheduling and keeping follow-up visits with specialty team, placing orders, and documenting in our EHR.    Return precautions advised.  Tana Conch, MD

## 2022-12-18 NOTE — Patient Instructions (Addendum)
Lets keep follow up with  Cardiology Gastroenterology SCHEDULE follow up with pulmonary We have a visit scheduled in July but if you can get established locally let us know and we can cancel that (of course we will miss you)   Please stop by lab before you go If you have mychart- we will send your results within 3 business days of [place] receiving them.  If you do not have mychart- we will call you about results within 5 business days of [place] receiving them.  *please also note that you will see labs on mychart as soon as they post. I will later go in and write notes on them- will say "notes from Dr. Durene Cal"   Recommended follow up: Return for next already scheduled visit or sooner if needed.

## 2022-12-20 DIAGNOSIS — K219 Gastro-esophageal reflux disease without esophagitis: Secondary | ICD-10-CM | POA: Diagnosis not present

## 2022-12-20 DIAGNOSIS — I4891 Unspecified atrial fibrillation: Secondary | ICD-10-CM | POA: Diagnosis not present

## 2022-12-20 DIAGNOSIS — Z7901 Long term (current) use of anticoagulants: Secondary | ICD-10-CM | POA: Diagnosis not present

## 2022-12-20 DIAGNOSIS — R933 Abnormal findings on diagnostic imaging of other parts of digestive tract: Secondary | ICD-10-CM | POA: Diagnosis not present

## 2022-12-20 DIAGNOSIS — Z86010 Personal history of colonic polyps: Secondary | ICD-10-CM | POA: Diagnosis not present

## 2022-12-20 DIAGNOSIS — K224 Dyskinesia of esophagus: Secondary | ICD-10-CM | POA: Diagnosis not present

## 2022-12-20 DIAGNOSIS — Z800 Family history of malignant neoplasm of digestive organs: Secondary | ICD-10-CM | POA: Diagnosis not present

## 2022-12-20 DIAGNOSIS — K5720 Diverticulitis of large intestine with perforation and abscess without bleeding: Secondary | ICD-10-CM | POA: Diagnosis not present

## 2022-12-20 DIAGNOSIS — K449 Diaphragmatic hernia without obstruction or gangrene: Secondary | ICD-10-CM | POA: Diagnosis not present

## 2022-12-23 DIAGNOSIS — S064XAA Epidural hemorrhage with loss of consciousness status unknown, initial encounter: Secondary | ICD-10-CM | POA: Diagnosis not present

## 2022-12-23 DIAGNOSIS — E785 Hyperlipidemia, unspecified: Secondary | ICD-10-CM | POA: Diagnosis not present

## 2022-12-23 DIAGNOSIS — I499 Cardiac arrhythmia, unspecified: Secondary | ICD-10-CM | POA: Diagnosis not present

## 2022-12-23 DIAGNOSIS — R29711 NIHSS score 11: Secondary | ICD-10-CM | POA: Diagnosis not present

## 2022-12-23 DIAGNOSIS — Z95818 Presence of other cardiac implants and grafts: Secondary | ICD-10-CM | POA: Diagnosis not present

## 2022-12-23 DIAGNOSIS — R6889 Other general symptoms and signs: Secondary | ICD-10-CM | POA: Diagnosis not present

## 2022-12-23 DIAGNOSIS — I621 Nontraumatic extradural hemorrhage: Secondary | ICD-10-CM | POA: Diagnosis not present

## 2022-12-23 DIAGNOSIS — Z743 Need for continuous supervision: Secondary | ICD-10-CM | POA: Diagnosis not present

## 2022-12-23 DIAGNOSIS — R2689 Other abnormalities of gait and mobility: Secondary | ICD-10-CM | POA: Diagnosis not present

## 2022-12-23 DIAGNOSIS — G9389 Other specified disorders of brain: Secondary | ICD-10-CM | POA: Diagnosis not present

## 2022-12-23 DIAGNOSIS — R1312 Dysphagia, oropharyngeal phase: Secondary | ICD-10-CM | POA: Diagnosis not present

## 2022-12-23 DIAGNOSIS — Z7901 Long term (current) use of anticoagulants: Secondary | ICD-10-CM | POA: Diagnosis not present

## 2022-12-23 DIAGNOSIS — I10 Essential (primary) hypertension: Secondary | ICD-10-CM | POA: Diagnosis not present

## 2022-12-23 DIAGNOSIS — M4802 Spinal stenosis, cervical region: Secondary | ICD-10-CM | POA: Diagnosis not present

## 2022-12-23 DIAGNOSIS — I6389 Other cerebral infarction: Secondary | ICD-10-CM | POA: Diagnosis not present

## 2022-12-23 DIAGNOSIS — E119 Type 2 diabetes mellitus without complications: Secondary | ICD-10-CM | POA: Diagnosis not present

## 2022-12-23 DIAGNOSIS — I4891 Unspecified atrial fibrillation: Secondary | ICD-10-CM | POA: Diagnosis not present

## 2022-12-23 DIAGNOSIS — R29898 Other symptoms and signs involving the musculoskeletal system: Secondary | ICD-10-CM | POA: Diagnosis not present

## 2022-12-23 DIAGNOSIS — D6832 Hemorrhagic disorder due to extrinsic circulating anticoagulants: Secondary | ICD-10-CM | POA: Diagnosis not present

## 2022-12-23 DIAGNOSIS — Z79899 Other long term (current) drug therapy: Secondary | ICD-10-CM | POA: Diagnosis not present

## 2022-12-23 DIAGNOSIS — M47892 Other spondylosis, cervical region: Secondary | ICD-10-CM | POA: Diagnosis not present

## 2022-12-23 DIAGNOSIS — E1142 Type 2 diabetes mellitus with diabetic polyneuropathy: Secondary | ICD-10-CM | POA: Diagnosis not present

## 2022-12-23 DIAGNOSIS — R52 Pain, unspecified: Secondary | ICD-10-CM | POA: Diagnosis not present

## 2022-12-23 DIAGNOSIS — G459 Transient cerebral ischemic attack, unspecified: Secondary | ICD-10-CM | POA: Diagnosis not present

## 2022-12-23 DIAGNOSIS — Z794 Long term (current) use of insulin: Secondary | ICD-10-CM | POA: Diagnosis not present

## 2022-12-23 DIAGNOSIS — M6281 Muscle weakness (generalized): Secondary | ICD-10-CM | POA: Diagnosis not present

## 2022-12-23 DIAGNOSIS — E559 Vitamin D deficiency, unspecified: Secondary | ICD-10-CM | POA: Diagnosis not present

## 2022-12-23 DIAGNOSIS — R531 Weakness: Secondary | ICD-10-CM | POA: Diagnosis not present

## 2022-12-27 DIAGNOSIS — R1312 Dysphagia, oropharyngeal phase: Secondary | ICD-10-CM | POA: Diagnosis not present

## 2022-12-27 DIAGNOSIS — K5900 Constipation, unspecified: Secondary | ICD-10-CM | POA: Diagnosis not present

## 2022-12-27 DIAGNOSIS — M4802 Spinal stenosis, cervical region: Secondary | ICD-10-CM | POA: Diagnosis not present

## 2022-12-27 DIAGNOSIS — R2689 Other abnormalities of gait and mobility: Secondary | ICD-10-CM | POA: Diagnosis not present

## 2022-12-27 DIAGNOSIS — I10 Essential (primary) hypertension: Secondary | ICD-10-CM | POA: Diagnosis not present

## 2022-12-27 DIAGNOSIS — M6281 Muscle weakness (generalized): Secondary | ICD-10-CM | POA: Diagnosis not present

## 2022-12-27 DIAGNOSIS — E785 Hyperlipidemia, unspecified: Secondary | ICD-10-CM | POA: Diagnosis not present

## 2022-12-27 DIAGNOSIS — E119 Type 2 diabetes mellitus without complications: Secondary | ICD-10-CM | POA: Diagnosis not present

## 2022-12-27 DIAGNOSIS — E559 Vitamin D deficiency, unspecified: Secondary | ICD-10-CM | POA: Diagnosis not present

## 2022-12-27 DIAGNOSIS — R531 Weakness: Secondary | ICD-10-CM | POA: Diagnosis not present

## 2022-12-27 DIAGNOSIS — I4891 Unspecified atrial fibrillation: Secondary | ICD-10-CM | POA: Diagnosis not present

## 2022-12-31 DIAGNOSIS — K5900 Constipation, unspecified: Secondary | ICD-10-CM | POA: Diagnosis not present

## 2022-12-31 DIAGNOSIS — R531 Weakness: Secondary | ICD-10-CM | POA: Diagnosis not present

## 2023-01-14 DIAGNOSIS — I4891 Unspecified atrial fibrillation: Secondary | ICD-10-CM | POA: Diagnosis not present

## 2023-01-15 DIAGNOSIS — I10 Essential (primary) hypertension: Secondary | ICD-10-CM | POA: Diagnosis not present

## 2023-01-15 DIAGNOSIS — S064XAA Epidural hemorrhage with loss of consciousness status unknown, initial encounter: Secondary | ICD-10-CM | POA: Diagnosis not present

## 2023-01-15 DIAGNOSIS — I4891 Unspecified atrial fibrillation: Secondary | ICD-10-CM | POA: Diagnosis not present

## 2023-01-15 DIAGNOSIS — E785 Hyperlipidemia, unspecified: Secondary | ICD-10-CM | POA: Diagnosis not present

## 2023-01-16 DIAGNOSIS — R2689 Other abnormalities of gait and mobility: Secondary | ICD-10-CM | POA: Diagnosis not present

## 2023-01-16 DIAGNOSIS — R1312 Dysphagia, oropharyngeal phase: Secondary | ICD-10-CM | POA: Diagnosis not present

## 2023-01-16 DIAGNOSIS — M6281 Muscle weakness (generalized): Secondary | ICD-10-CM | POA: Diagnosis not present

## 2023-01-16 DIAGNOSIS — M4802 Spinal stenosis, cervical region: Secondary | ICD-10-CM | POA: Diagnosis not present

## 2023-01-17 DIAGNOSIS — R1312 Dysphagia, oropharyngeal phase: Secondary | ICD-10-CM | POA: Diagnosis not present

## 2023-01-17 DIAGNOSIS — M6281 Muscle weakness (generalized): Secondary | ICD-10-CM | POA: Diagnosis not present

## 2023-01-17 DIAGNOSIS — R2689 Other abnormalities of gait and mobility: Secondary | ICD-10-CM | POA: Diagnosis not present

## 2023-01-17 DIAGNOSIS — M4802 Spinal stenosis, cervical region: Secondary | ICD-10-CM | POA: Diagnosis not present

## 2023-01-21 ENCOUNTER — Telehealth: Payer: Self-pay | Admitting: Family Medicine

## 2023-01-21 NOTE — Telephone Encounter (Signed)
FYI: Pt canceled recent appointment. States they are looking for a primary in [place] but stated she will miss Dr. Durene Cal.

## 2023-01-21 NOTE — Telephone Encounter (Signed)
FYI

## 2023-01-22 DIAGNOSIS — R2689 Other abnormalities of gait and mobility: Secondary | ICD-10-CM | POA: Diagnosis not present

## 2023-01-22 DIAGNOSIS — M6281 Muscle weakness (generalized): Secondary | ICD-10-CM | POA: Diagnosis not present

## 2023-01-22 DIAGNOSIS — M4802 Spinal stenosis, cervical region: Secondary | ICD-10-CM | POA: Diagnosis not present

## 2023-01-22 DIAGNOSIS — R1312 Dysphagia, oropharyngeal phase: Secondary | ICD-10-CM | POA: Diagnosis not present

## 2023-02-04 ENCOUNTER — Ambulatory Visit: Payer: Medicare Other | Admitting: Family Medicine

## 2023-02-04 DIAGNOSIS — D860 Sarcoidosis of lung: Secondary | ICD-10-CM | POA: Diagnosis not present

## 2023-02-05 DIAGNOSIS — K219 Gastro-esophageal reflux disease without esophagitis: Secondary | ICD-10-CM | POA: Diagnosis not present

## 2023-02-05 DIAGNOSIS — Z86010 Personal history of colonic polyps: Secondary | ICD-10-CM | POA: Diagnosis not present

## 2023-02-05 DIAGNOSIS — D123 Benign neoplasm of transverse colon: Secondary | ICD-10-CM | POA: Diagnosis not present

## 2023-02-05 DIAGNOSIS — R933 Abnormal findings on diagnostic imaging of other parts of digestive tract: Secondary | ICD-10-CM | POA: Diagnosis not present

## 2023-02-05 DIAGNOSIS — K449 Diaphragmatic hernia without obstruction or gangrene: Secondary | ICD-10-CM | POA: Diagnosis not present

## 2023-02-05 DIAGNOSIS — Z7901 Long term (current) use of anticoagulants: Secondary | ICD-10-CM | POA: Diagnosis not present

## 2023-02-05 DIAGNOSIS — K5730 Diverticulosis of large intestine without perforation or abscess without bleeding: Secondary | ICD-10-CM | POA: Diagnosis not present

## 2023-02-05 DIAGNOSIS — I4891 Unspecified atrial fibrillation: Secondary | ICD-10-CM | POA: Diagnosis not present

## 2023-02-05 DIAGNOSIS — Z800 Family history of malignant neoplasm of digestive organs: Secondary | ICD-10-CM | POA: Diagnosis not present

## 2023-02-05 DIAGNOSIS — I10 Essential (primary) hypertension: Secondary | ICD-10-CM | POA: Diagnosis not present

## 2023-02-05 DIAGNOSIS — K224 Dyskinesia of esophagus: Secondary | ICD-10-CM | POA: Diagnosis not present

## 2023-02-05 DIAGNOSIS — K5720 Diverticulitis of large intestine with perforation and abscess without bleeding: Secondary | ICD-10-CM | POA: Diagnosis not present

## 2023-02-05 DIAGNOSIS — K635 Polyp of colon: Secondary | ICD-10-CM | POA: Diagnosis not present

## 2023-02-05 LAB — HM COLONOSCOPY

## 2023-02-07 ENCOUNTER — Telehealth: Payer: Self-pay | Admitting: Family Medicine

## 2023-02-07 NOTE — Telephone Encounter (Signed)
Per patient no longer seen at [facility] by Dr Durene Cal.  Patient has relocated to [place], [place].  Gabriel Cirri [facility] AWV TEAM Direct Dial [phone]

## 2023-02-14 DIAGNOSIS — I4891 Unspecified atrial fibrillation: Secondary | ICD-10-CM | POA: Diagnosis not present

## 2023-02-14 DIAGNOSIS — R2689 Other abnormalities of gait and mobility: Secondary | ICD-10-CM | POA: Diagnosis not present

## 2023-02-14 DIAGNOSIS — R051 Acute cough: Secondary | ICD-10-CM | POA: Diagnosis not present

## 2023-02-14 DIAGNOSIS — S064XAA Epidural hemorrhage with loss of consciousness status unknown, initial encounter: Secondary | ICD-10-CM | POA: Diagnosis not present

## 2023-02-14 DIAGNOSIS — J984 Other disorders of lung: Secondary | ICD-10-CM | POA: Diagnosis not present

## 2023-02-15 DIAGNOSIS — M6281 Muscle weakness (generalized): Secondary | ICD-10-CM | POA: Diagnosis not present

## 2023-02-15 DIAGNOSIS — M4802 Spinal stenosis, cervical region: Secondary | ICD-10-CM | POA: Diagnosis not present

## 2023-02-15 DIAGNOSIS — R1312 Dysphagia, oropharyngeal phase: Secondary | ICD-10-CM | POA: Diagnosis not present

## 2023-02-15 DIAGNOSIS — R2689 Other abnormalities of gait and mobility: Secondary | ICD-10-CM | POA: Diagnosis not present

## 2023-02-18 DIAGNOSIS — S064XAA Epidural hemorrhage with loss of consciousness status unknown, initial encounter: Secondary | ICD-10-CM | POA: Diagnosis not present

## 2023-02-18 DIAGNOSIS — H20023 Recurrent acute iridocyclitis, bilateral: Secondary | ICD-10-CM | POA: Diagnosis not present

## 2023-02-18 DIAGNOSIS — M50222 Other cervical disc displacement at C5-C6 level: Secondary | ICD-10-CM | POA: Diagnosis not present

## 2023-02-18 DIAGNOSIS — H40023 Open angle with borderline findings, high risk, bilateral: Secondary | ICD-10-CM | POA: Diagnosis not present

## 2023-02-18 DIAGNOSIS — Z79899 Other long term (current) drug therapy: Secondary | ICD-10-CM | POA: Diagnosis not present

## 2023-02-18 DIAGNOSIS — H35013 Changes in retinal vascular appearance, bilateral: Secondary | ICD-10-CM | POA: Diagnosis not present

## 2023-02-18 DIAGNOSIS — Z961 Presence of intraocular lens: Secondary | ICD-10-CM | POA: Diagnosis not present

## 2023-02-18 DIAGNOSIS — H43393 Other vitreous opacities, bilateral: Secondary | ICD-10-CM | POA: Diagnosis not present

## 2023-02-18 DIAGNOSIS — M47892 Other spondylosis, cervical region: Secondary | ICD-10-CM | POA: Diagnosis not present

## 2023-02-18 DIAGNOSIS — H04123 Dry eye syndrome of bilateral lacrimal glands: Secondary | ICD-10-CM | POA: Diagnosis not present

## 2023-02-18 DIAGNOSIS — H35371 Puckering of macula, right eye: Secondary | ICD-10-CM | POA: Diagnosis not present

## 2023-02-20 DIAGNOSIS — M6281 Muscle weakness (generalized): Secondary | ICD-10-CM | POA: Diagnosis not present

## 2023-02-20 DIAGNOSIS — R1312 Dysphagia, oropharyngeal phase: Secondary | ICD-10-CM | POA: Diagnosis not present

## 2023-02-20 DIAGNOSIS — M4802 Spinal stenosis, cervical region: Secondary | ICD-10-CM | POA: Diagnosis not present

## 2023-02-20 DIAGNOSIS — R2689 Other abnormalities of gait and mobility: Secondary | ICD-10-CM | POA: Diagnosis not present

## 2023-02-22 DIAGNOSIS — M4802 Spinal stenosis, cervical region: Secondary | ICD-10-CM | POA: Diagnosis not present

## 2023-02-22 DIAGNOSIS — M6281 Muscle weakness (generalized): Secondary | ICD-10-CM | POA: Diagnosis not present

## 2023-02-22 DIAGNOSIS — R1312 Dysphagia, oropharyngeal phase: Secondary | ICD-10-CM | POA: Diagnosis not present

## 2023-02-22 DIAGNOSIS — R2689 Other abnormalities of gait and mobility: Secondary | ICD-10-CM | POA: Diagnosis not present

## 2023-02-27 DIAGNOSIS — R1312 Dysphagia, oropharyngeal phase: Secondary | ICD-10-CM | POA: Diagnosis not present

## 2023-02-27 DIAGNOSIS — M4802 Spinal stenosis, cervical region: Secondary | ICD-10-CM | POA: Diagnosis not present

## 2023-02-27 DIAGNOSIS — M6281 Muscle weakness (generalized): Secondary | ICD-10-CM | POA: Diagnosis not present

## 2023-02-27 DIAGNOSIS — R2689 Other abnormalities of gait and mobility: Secondary | ICD-10-CM | POA: Diagnosis not present

## 2023-03-01 DIAGNOSIS — R2689 Other abnormalities of gait and mobility: Secondary | ICD-10-CM | POA: Diagnosis not present

## 2023-03-01 DIAGNOSIS — M4802 Spinal stenosis, cervical region: Secondary | ICD-10-CM | POA: Diagnosis not present

## 2023-03-01 DIAGNOSIS — M6281 Muscle weakness (generalized): Secondary | ICD-10-CM | POA: Diagnosis not present

## 2023-03-01 DIAGNOSIS — R1312 Dysphagia, oropharyngeal phase: Secondary | ICD-10-CM | POA: Diagnosis not present

## 2023-03-05 DIAGNOSIS — I4891 Unspecified atrial fibrillation: Secondary | ICD-10-CM | POA: Diagnosis not present

## 2023-03-06 DIAGNOSIS — R1312 Dysphagia, oropharyngeal phase: Secondary | ICD-10-CM | POA: Diagnosis not present

## 2023-03-06 DIAGNOSIS — M6281 Muscle weakness (generalized): Secondary | ICD-10-CM | POA: Diagnosis not present

## 2023-03-06 DIAGNOSIS — R2689 Other abnormalities of gait and mobility: Secondary | ICD-10-CM | POA: Diagnosis not present

## 2023-03-06 DIAGNOSIS — M4802 Spinal stenosis, cervical region: Secondary | ICD-10-CM | POA: Diagnosis not present

## 2023-03-08 DIAGNOSIS — M4802 Spinal stenosis, cervical region: Secondary | ICD-10-CM | POA: Diagnosis not present

## 2023-03-08 DIAGNOSIS — M6281 Muscle weakness (generalized): Secondary | ICD-10-CM | POA: Diagnosis not present

## 2023-03-08 DIAGNOSIS — R1312 Dysphagia, oropharyngeal phase: Secondary | ICD-10-CM | POA: Diagnosis not present

## 2023-03-08 DIAGNOSIS — R2689 Other abnormalities of gait and mobility: Secondary | ICD-10-CM | POA: Diagnosis not present

## 2023-03-15 DIAGNOSIS — M6281 Muscle weakness (generalized): Secondary | ICD-10-CM | POA: Diagnosis not present

## 2023-03-15 DIAGNOSIS — M4802 Spinal stenosis, cervical region: Secondary | ICD-10-CM | POA: Diagnosis not present

## 2023-03-15 DIAGNOSIS — R2689 Other abnormalities of gait and mobility: Secondary | ICD-10-CM | POA: Diagnosis not present

## 2023-03-15 DIAGNOSIS — R1312 Dysphagia, oropharyngeal phase: Secondary | ICD-10-CM | POA: Diagnosis not present

## 2023-03-20 DIAGNOSIS — R7303 Prediabetes: Secondary | ICD-10-CM | POA: Diagnosis not present

## 2023-03-20 DIAGNOSIS — I4891 Unspecified atrial fibrillation: Secondary | ICD-10-CM | POA: Diagnosis not present

## 2023-03-20 DIAGNOSIS — S064XAA Epidural hemorrhage with loss of consciousness status unknown, initial encounter: Secondary | ICD-10-CM | POA: Diagnosis not present

## 2023-03-20 DIAGNOSIS — R2689 Other abnormalities of gait and mobility: Secondary | ICD-10-CM | POA: Diagnosis not present

## 2023-03-20 DIAGNOSIS — R1312 Dysphagia, oropharyngeal phase: Secondary | ICD-10-CM | POA: Diagnosis not present

## 2023-03-20 DIAGNOSIS — M6281 Muscle weakness (generalized): Secondary | ICD-10-CM | POA: Diagnosis not present

## 2023-03-20 DIAGNOSIS — M4802 Spinal stenosis, cervical region: Secondary | ICD-10-CM | POA: Diagnosis not present

## 2023-03-27 DIAGNOSIS — R2689 Other abnormalities of gait and mobility: Secondary | ICD-10-CM | POA: Diagnosis not present

## 2023-03-27 DIAGNOSIS — M4802 Spinal stenosis, cervical region: Secondary | ICD-10-CM | POA: Diagnosis not present

## 2023-03-27 DIAGNOSIS — M6281 Muscle weakness (generalized): Secondary | ICD-10-CM | POA: Diagnosis not present

## 2023-03-27 DIAGNOSIS — R1312 Dysphagia, oropharyngeal phase: Secondary | ICD-10-CM | POA: Diagnosis not present

## 2023-03-29 DIAGNOSIS — M6281 Muscle weakness (generalized): Secondary | ICD-10-CM | POA: Diagnosis not present

## 2023-03-29 DIAGNOSIS — R2689 Other abnormalities of gait and mobility: Secondary | ICD-10-CM | POA: Diagnosis not present

## 2023-03-29 DIAGNOSIS — M4802 Spinal stenosis, cervical region: Secondary | ICD-10-CM | POA: Diagnosis not present

## 2023-03-29 DIAGNOSIS — R1312 Dysphagia, oropharyngeal phase: Secondary | ICD-10-CM | POA: Diagnosis not present

## 2023-04-03 DIAGNOSIS — R1312 Dysphagia, oropharyngeal phase: Secondary | ICD-10-CM | POA: Diagnosis not present

## 2023-04-03 DIAGNOSIS — M6281 Muscle weakness (generalized): Secondary | ICD-10-CM | POA: Diagnosis not present

## 2023-04-03 DIAGNOSIS — R2689 Other abnormalities of gait and mobility: Secondary | ICD-10-CM | POA: Diagnosis not present

## 2023-04-03 DIAGNOSIS — M4802 Spinal stenosis, cervical region: Secondary | ICD-10-CM | POA: Diagnosis not present

## 2023-04-10 DIAGNOSIS — R2689 Other abnormalities of gait and mobility: Secondary | ICD-10-CM | POA: Diagnosis not present

## 2023-04-10 DIAGNOSIS — M4802 Spinal stenosis, cervical region: Secondary | ICD-10-CM | POA: Diagnosis not present

## 2023-04-10 DIAGNOSIS — R1312 Dysphagia, oropharyngeal phase: Secondary | ICD-10-CM | POA: Diagnosis not present

## 2023-04-10 DIAGNOSIS — M6281 Muscle weakness (generalized): Secondary | ICD-10-CM | POA: Diagnosis not present

## 2023-04-12 DIAGNOSIS — M6281 Muscle weakness (generalized): Secondary | ICD-10-CM | POA: Diagnosis not present

## 2023-04-12 DIAGNOSIS — R1312 Dysphagia, oropharyngeal phase: Secondary | ICD-10-CM | POA: Diagnosis not present

## 2023-04-12 DIAGNOSIS — R2689 Other abnormalities of gait and mobility: Secondary | ICD-10-CM | POA: Diagnosis not present

## 2023-04-12 DIAGNOSIS — M4802 Spinal stenosis, cervical region: Secondary | ICD-10-CM | POA: Diagnosis not present

## 2023-04-22 DIAGNOSIS — R7303 Prediabetes: Secondary | ICD-10-CM | POA: Diagnosis not present

## 2023-04-22 DIAGNOSIS — I4891 Unspecified atrial fibrillation: Secondary | ICD-10-CM | POA: Diagnosis not present

## 2023-04-24 DIAGNOSIS — Z23 Encounter for immunization: Secondary | ICD-10-CM | POA: Diagnosis not present

## 2023-04-24 DIAGNOSIS — E119 Type 2 diabetes mellitus without complications: Secondary | ICD-10-CM | POA: Diagnosis not present

## 2023-04-24 DIAGNOSIS — M8580 Other specified disorders of bone density and structure, unspecified site: Secondary | ICD-10-CM | POA: Diagnosis not present

## 2023-06-06 DIAGNOSIS — I4891 Unspecified atrial fibrillation: Secondary | ICD-10-CM | POA: Diagnosis not present

## 2023-08-05 DIAGNOSIS — I4891 Unspecified atrial fibrillation: Secondary | ICD-10-CM | POA: Diagnosis not present

## 2023-08-05 DIAGNOSIS — G629 Polyneuropathy, unspecified: Secondary | ICD-10-CM | POA: Diagnosis not present

## 2023-08-05 DIAGNOSIS — E119 Type 2 diabetes mellitus without complications: Secondary | ICD-10-CM | POA: Diagnosis not present

## 2023-09-02 DIAGNOSIS — H43393 Other vitreous opacities, bilateral: Secondary | ICD-10-CM | POA: Diagnosis not present

## 2023-09-02 DIAGNOSIS — H35013 Changes in retinal vascular appearance, bilateral: Secondary | ICD-10-CM | POA: Diagnosis not present

## 2023-09-02 DIAGNOSIS — H524 Presbyopia: Secondary | ICD-10-CM | POA: Diagnosis not present

## 2023-09-02 DIAGNOSIS — Z961 Presence of intraocular lens: Secondary | ICD-10-CM | POA: Diagnosis not present

## 2023-09-02 DIAGNOSIS — H52201 Unspecified astigmatism, right eye: Secondary | ICD-10-CM | POA: Diagnosis not present

## 2023-09-02 DIAGNOSIS — H40023 Open angle with borderline findings, high risk, bilateral: Secondary | ICD-10-CM | POA: Diagnosis not present

## 2023-09-02 DIAGNOSIS — H04123 Dry eye syndrome of bilateral lacrimal glands: Secondary | ICD-10-CM | POA: Diagnosis not present

## 2023-09-02 DIAGNOSIS — H5213 Myopia, bilateral: Secondary | ICD-10-CM | POA: Diagnosis not present

## 2023-09-02 DIAGNOSIS — H35371 Puckering of macula, right eye: Secondary | ICD-10-CM | POA: Diagnosis not present

## 2023-09-20 DIAGNOSIS — J449 Chronic obstructive pulmonary disease, unspecified: Secondary | ICD-10-CM | POA: Diagnosis not present

## 2023-09-20 DIAGNOSIS — D860 Sarcoidosis of lung: Secondary | ICD-10-CM | POA: Diagnosis not present

## 2023-10-31 DIAGNOSIS — N8110 Cystocele, unspecified: Secondary | ICD-10-CM | POA: Diagnosis not present

## 2023-10-31 DIAGNOSIS — N390 Urinary tract infection, site not specified: Secondary | ICD-10-CM | POA: Diagnosis not present

## 2023-11-25 DIAGNOSIS — I10 Essential (primary) hypertension: Secondary | ICD-10-CM | POA: Diagnosis not present

## 2023-11-25 DIAGNOSIS — Z0000 Encounter for general adult medical examination without abnormal findings: Secondary | ICD-10-CM | POA: Diagnosis not present

## 2023-11-25 DIAGNOSIS — E119 Type 2 diabetes mellitus without complications: Secondary | ICD-10-CM | POA: Diagnosis not present

## 2023-12-06 DIAGNOSIS — I4891 Unspecified atrial fibrillation: Secondary | ICD-10-CM | POA: Diagnosis not present

## 2024-04-13 NOTE — Progress Notes (Signed)
 SABRA
